# Patient Record
Sex: Male | Born: 1950
Health system: Southern US, Community
[De-identification: ages and names within clinical notes are randomized; demographics above are authoritative.]

## PROBLEM LIST (undated history)

## (undated) DIAGNOSIS — E669 Obesity, unspecified: Secondary | ICD-10-CM

## (undated) DIAGNOSIS — E785 Hyperlipidemia, unspecified: Secondary | ICD-10-CM

## (undated) DIAGNOSIS — I77810 Thoracic aortic ectasia: Secondary | ICD-10-CM

## (undated) DIAGNOSIS — M1990 Unspecified osteoarthritis, unspecified site: Secondary | ICD-10-CM

## (undated) DIAGNOSIS — Z8719 Personal history of other diseases of the digestive system: Secondary | ICD-10-CM

## (undated) DIAGNOSIS — G4730 Sleep apnea, unspecified: Secondary | ICD-10-CM

## (undated) DIAGNOSIS — I10 Essential (primary) hypertension: Secondary | ICD-10-CM

## (undated) DIAGNOSIS — I219 Acute myocardial infarction, unspecified: Secondary | ICD-10-CM

## (undated) DIAGNOSIS — I4892 Unspecified atrial flutter: Secondary | ICD-10-CM

## (undated) DIAGNOSIS — I2510 Atherosclerotic heart disease of native coronary artery without angina pectoris: Secondary | ICD-10-CM

## (undated) DIAGNOSIS — I739 Peripheral vascular disease, unspecified: Secondary | ICD-10-CM

## (undated) HISTORY — DX: Essential (primary) hypertension: I10

## (undated) HISTORY — PX: CARDIAC SURGERY: SHX584

## (undated) HISTORY — DX: Unspecified atrial flutter: I48.92

## (undated) HISTORY — PX: APPENDECTOMY: SHX54

## (undated) HISTORY — PX: CORONARY ANGIOPLASTY WITH STENT PLACEMENT: SHX49

## (undated) HISTORY — DX: Thoracic aortic ectasia: I77.810

## (undated) HISTORY — DX: Obesity, unspecified: E66.9

## (undated) HISTORY — DX: Personal history of other diseases of the digestive system: Z87.19

## (undated) HISTORY — PX: CHOLECYSTECTOMY: SHX55

## (undated) HISTORY — PX: OTHER SURGICAL HISTORY: SHX169

## (undated) HISTORY — DX: Atherosclerotic heart disease of native coronary artery without angina pectoris: I25.10

## (undated) HISTORY — PX: ELBOW ARTHROSCOPY: SHX614

---

## 2003-03-05 ENCOUNTER — Inpatient Hospital Stay (HOSPITAL_COMMUNITY): Admission: AD | Admit: 2003-03-05 | Discharge: 2003-03-11 | Payer: Self-pay | Admitting: Cardiology

## 2003-03-05 ENCOUNTER — Encounter: Payer: Self-pay | Admitting: Cardiology

## 2003-06-18 ENCOUNTER — Inpatient Hospital Stay (HOSPITAL_COMMUNITY): Admission: EM | Admit: 2003-06-18 | Discharge: 2003-06-21 | Payer: Self-pay | Admitting: Cardiology

## 2003-08-12 ENCOUNTER — Inpatient Hospital Stay (HOSPITAL_COMMUNITY): Admission: AD | Admit: 2003-08-12 | Discharge: 2003-08-14 | Payer: Self-pay | Admitting: Cardiology

## 2004-02-05 ENCOUNTER — Inpatient Hospital Stay (HOSPITAL_COMMUNITY): Admission: EM | Admit: 2004-02-05 | Discharge: 2004-02-07 | Payer: Self-pay | Admitting: Cardiology

## 2004-07-03 HISTORY — PX: TOTAL KNEE ARTHROPLASTY: SHX125

## 2004-08-04 ENCOUNTER — Ambulatory Visit: Payer: Self-pay | Admitting: Cardiology

## 2004-08-26 ENCOUNTER — Ambulatory Visit: Payer: Self-pay | Admitting: Internal Medicine

## 2004-08-26 ENCOUNTER — Inpatient Hospital Stay (HOSPITAL_COMMUNITY): Admission: RE | Admit: 2004-08-26 | Discharge: 2004-08-31 | Payer: Self-pay | Admitting: Specialist

## 2005-02-23 ENCOUNTER — Ambulatory Visit: Payer: Self-pay | Admitting: Cardiology

## 2005-06-14 ENCOUNTER — Ambulatory Visit: Payer: Self-pay | Admitting: Cardiology

## 2005-06-20 ENCOUNTER — Ambulatory Visit: Payer: Self-pay | Admitting: Cardiology

## 2005-07-03 HISTORY — PX: TOTAL KNEE ARTHROPLASTY: SHX125

## 2005-08-25 ENCOUNTER — Inpatient Hospital Stay (HOSPITAL_COMMUNITY): Admission: RE | Admit: 2005-08-25 | Discharge: 2005-08-30 | Payer: Self-pay | Admitting: Specialist

## 2005-08-28 ENCOUNTER — Ambulatory Visit: Payer: Self-pay | Admitting: Internal Medicine

## 2006-10-05 ENCOUNTER — Ambulatory Visit: Payer: Self-pay | Admitting: Physician Assistant

## 2006-10-19 ENCOUNTER — Ambulatory Visit: Payer: Self-pay | Admitting: Cardiology

## 2006-11-19 ENCOUNTER — Ambulatory Visit: Payer: Self-pay | Admitting: Physician Assistant

## 2007-05-14 ENCOUNTER — Ambulatory Visit: Payer: Self-pay | Admitting: Cardiology

## 2007-05-20 ENCOUNTER — Encounter: Payer: Self-pay | Admitting: Cardiology

## 2008-10-13 ENCOUNTER — Ambulatory Visit: Payer: Self-pay | Admitting: Cardiology

## 2008-11-02 ENCOUNTER — Encounter: Payer: Self-pay | Admitting: Cardiology

## 2008-11-03 ENCOUNTER — Ambulatory Visit: Payer: Self-pay | Admitting: Cardiology

## 2009-02-22 ENCOUNTER — Encounter: Payer: Self-pay | Admitting: Cardiology

## 2009-02-26 ENCOUNTER — Encounter: Payer: Self-pay | Admitting: Cardiology

## 2009-03-03 ENCOUNTER — Encounter: Payer: Self-pay | Admitting: Cardiology

## 2009-03-03 HISTORY — PX: CERVICAL FUSION: SHX112

## 2009-03-09 ENCOUNTER — Inpatient Hospital Stay (HOSPITAL_COMMUNITY): Admission: RE | Admit: 2009-03-09 | Discharge: 2009-03-13 | Payer: Self-pay | Admitting: Neurosurgery

## 2009-03-10 ENCOUNTER — Encounter: Payer: Self-pay | Admitting: Cardiology

## 2009-03-13 ENCOUNTER — Encounter: Payer: Self-pay | Admitting: Cardiology

## 2009-03-22 DIAGNOSIS — E782 Mixed hyperlipidemia: Secondary | ICD-10-CM

## 2009-03-22 DIAGNOSIS — I2510 Atherosclerotic heart disease of native coronary artery without angina pectoris: Secondary | ICD-10-CM

## 2009-03-22 DIAGNOSIS — I10 Essential (primary) hypertension: Secondary | ICD-10-CM | POA: Insufficient documentation

## 2009-03-22 DIAGNOSIS — E663 Overweight: Secondary | ICD-10-CM | POA: Insufficient documentation

## 2009-05-14 ENCOUNTER — Encounter: Payer: Self-pay | Admitting: Cardiology

## 2009-05-19 ENCOUNTER — Ambulatory Visit: Payer: Self-pay | Admitting: Cardiology

## 2009-05-19 DIAGNOSIS — I77810 Thoracic aortic ectasia: Secondary | ICD-10-CM | POA: Insufficient documentation

## 2009-05-19 DIAGNOSIS — G4733 Obstructive sleep apnea (adult) (pediatric): Secondary | ICD-10-CM

## 2009-05-19 DIAGNOSIS — Z8719 Personal history of other diseases of the digestive system: Secondary | ICD-10-CM | POA: Insufficient documentation

## 2009-05-19 DIAGNOSIS — I259 Chronic ischemic heart disease, unspecified: Secondary | ICD-10-CM | POA: Insufficient documentation

## 2009-09-15 ENCOUNTER — Encounter: Payer: Self-pay | Admitting: Cardiology

## 2009-10-01 HISTORY — PX: SHOULDER ARTHROSCOPY: SHX128

## 2009-10-12 ENCOUNTER — Ambulatory Visit (HOSPITAL_BASED_OUTPATIENT_CLINIC_OR_DEPARTMENT_OTHER): Admission: RE | Admit: 2009-10-12 | Discharge: 2009-10-13 | Payer: Self-pay | Admitting: Specialist

## 2009-12-03 ENCOUNTER — Encounter: Payer: Self-pay | Admitting: Cardiology

## 2009-12-03 ENCOUNTER — Encounter (INDEPENDENT_AMBULATORY_CARE_PROVIDER_SITE_OTHER): Payer: Self-pay | Admitting: *Deleted

## 2010-01-06 ENCOUNTER — Telehealth (INDEPENDENT_AMBULATORY_CARE_PROVIDER_SITE_OTHER): Payer: Self-pay | Admitting: *Deleted

## 2010-01-11 ENCOUNTER — Ambulatory Visit: Payer: Self-pay | Admitting: Cardiology

## 2010-01-20 ENCOUNTER — Ambulatory Visit: Payer: Self-pay | Admitting: Cardiology

## 2010-04-07 ENCOUNTER — Encounter: Payer: Self-pay | Admitting: Cardiology

## 2010-08-02 NOTE — Letter (Signed)
Summary: Letter/ FAXED [facility]  Letter/ FAXED [facility]   Imported By: Dorise Hiss 09/15/2009 17:10:26  _____________________________________________________________________  External Attachment:    Type:   Image     Comment:   External Document

## 2010-08-02 NOTE — Miscellaneous (Signed)
Summary: Orders Update  Clinical Lists Changes  Orders: Added new Referral order of 2-D Echocardiogram (2D Echo) - Signed 

## 2010-08-02 NOTE — Assessment & Plan Note (Signed)
Summary: 6 MO FU PER MAY REMINDER-SRS  Medications Added METOPROLOL TARTRATE 50 MG TABS (METOPROLOL TARTRATE) Take 1/2 tablet by mouth two times a day DIOVAN HCT 160-25 MG TABS (VALSARTAN-HYDROCHLOROTHIAZIDE) Take 1 tablet by mouth once a day NAPROSYN 500 MG TABS (NAPROXEN) Take 1 tablet by mouth twice a day COENZYME Q10 200 MG CAPS (COENZYME Q10) Take 1 tablet by mouth once a day FLAX SEED OIL 1000 MG CAPS (FLAXSEED (LINSEED)) Take 1 tablet by mouth two times a day FOLGARD 115-800-10 MCG-MCG-MG TABS (FOLIC ACID-VIT B6-VIT B12) Take 1 tablet by mouth once a day VITAMIN B-6 50 MG TABS (PYRIDOXINE HCL) Take 1 tablet by mouth once a day VITAMIN B-12 1000 MCG TABS (CYANOCOBALAMIN) Take 1 tablet by mouth once a day RA BIOTIN 2500 MCG CAPS (BIOTIN) Take 1 tablet by mouth once a day      Allergies Added: NKDA  Primary Provider:  Dimas Aguas   History of Present Illness: the patient is a 60 year old male with history of coronary artery disease, aortic root dilatation, status post prior multiple stents with a history of late stent thrombosis and no procedures since 2005. The patient is a lifelong therapy with Plavix. The patient is doing well. He denies any shortness of breath or chest pain. He has no palpitations or syncope. His blood pressure is controlled in the setting of aortic root dilatation.  Current Medications (verified): 1)  Plavix 75 Mg Tabs (Clopidogrel Bisulfate) .... Take 1 Tablet By Mouth Once A Day 2)  Metoprolol Tartrate 50 Mg Tabs (Metoprolol Tartrate) .... Take 1/2 Tablet By Mouth Two Times A Day 3)  Diovan Hct 160-25 Mg Tabs (Valsartan-Hydrochlorothiazide) .... Take 1 Tablet By Mouth Once A Day 4)  Naprosyn 500 Mg Tabs (Naproxen) .... Take 1 Tablet By Mouth Twice A Day 5)  Multivitamins  Tabs (Multiple Vitamin) .... Take 1 Tablet By Mouth Once A Day 6)  Fish Oil 1000 Mg Caps (Omega-3 Fatty Acids) .... Take 2 Tablet By Mouth Once A Day 7)  Aspirin 325 Mg Tabs (Aspirin) .... Take 1  Tablet By Mouth Once A Day 8)  Folic Acid 1 Mg Tabs (Folic Acid) .... Take 1 Tablet By Mouth Once A Day 9)  Coenzyme Q10 200 Mg Caps (Coenzyme Q10) .... Take 1 Tablet By Mouth Once A Day 10)  Flax Seed Oil 1000 Mg Caps (Flaxseed (Linseed)) .... Take 1 Tablet By Mouth Two Times A Day 11)  Folgard 115-800-10 Mcg-Mcg-Mg Tabs (Folic Acid-Vit B6-Vit B12) .... Take 1 Tablet By Mouth Once A Day 12)  Vitamin B-6 50 Mg Tabs (Pyridoxine Hcl) .... Take 1 Tablet By Mouth Once A Day 13)  Vitamin B-12 1000 Mcg Tabs (Cyanocobalamin) .... Take 1 Tablet By Mouth Once A Day 14)  Ra Biotin 2500 Mcg Caps (Biotin) .... Take 1 Tablet By Mouth Once A Day  Allergies (verified): No Known Drug Allergies  Comments:  Nurse/Medical Assistant: The patient's medication list and allergies were reviewed with the patient and were updated in the Medication and Allergy Lists.  Past History:  Past Medical History: Last updated: 05/19/2009 OVERWEIGHT/OBESITY (ICD-278.02) HYPERTENSION, UNSPECIFIED (ICD-401.9) HYPERLIPIDEMIA-MIXED (ICD-272.4) CAD, NATIVE VESSEL (ICD-414.01) Osteoarthritis.  Aortic root dilatation.  GI bleeding on high-dose nonsteroidals OSA 1. Coronary artery disease with stable symptoms.     a.     Status post left percutaneous coronary interventions, heart      catheterization in 2005.     b.     Stress Cardiolite test in 2008, no ischemia.  c.     __________ dysfunction. 2. Dyslipidemia. 3. Hypertension. 4. Obstructive sleep apnea. 5. Obesity. 6. Osteoarthritis. 7. Aortic root dilatation. 8. GI bleeding on high-dose nonsteroidals. C3 in C4 and C4 and C5 translaminar plasty with segmental posterior lateral fusion and posterolateral arthrodesis 03/13/2009  Past Surgical History: Last updated: 03/22/2009 Cholecystectomy Knee Arthroplasty-Total  Family History: Last updated: 03/22/2009 Family History of Cancer:  Family History of Coronary Artery Disease:   Social History: Last  updated: 03/22/2009 Full Time Tobacco Use - No.  Alcohol Use - yes Married   Risk Factors: Smoking Status: never (05/19/2009)  Review of Systems  The patient denies fatigue, malaise, fever, weight gain/loss, vision loss, decreased hearing, hoarseness, chest pain, palpitations, shortness of breath, prolonged cough, wheezing, sleep apnea, coughing up blood, abdominal pain, blood in stool, nausea, vomiting, diarrhea, heartburn, incontinence, blood in urine, muscle weakness, joint pain, leg swelling, rash, skin lesions, headache, fainting, dizziness, depression, anxiety, enlarged lymph nodes, easy bruising or bleeding, and environmental allergies.    Vital Signs:  Patient profile:   60 year old male Height:      69 inches Weight:      274 pounds Pulse rate:   55 / minute BP sitting:   131 / 82  (left arm) Cuff size:   large  Vitals Entered By: Carlye Grippe (January 11, 2010 10:40 AM)  Physical Exam  Additional Exam:  General: Well-developed, well-nourished in no distress head: Normocephalic and atraumatic eyes PERRLA/EOMI intact, conjunctiva and lids normal nose: No deformity or lesions mouth normal dentition, normal posterior pharynx neck: Supple, no JVD.  No masses, thyromegaly or abnormal cervical nodes lungs: Normal breath sounds bilaterally without wheezing.  Normal percussion heart: regular rate and rhythm with normal S1 and S2, no S3 or S4.  PMI is normal.  No pathological murmurs abdomen: Normal bowel sounds, abdomen is soft and nontender without masses, organomegaly or hernias noted.  No hepatosplenomegaly musculoskeletal: Back normal, normal gait muscle strength and tone normal pulsus: Pulse is normal in all 4 extremities Extremities: No peripheral pitting edema neurologic: Alert and oriented x 3 skin: Intact without lesions or rashes cervical nodes: No significant adenopathy psychologic: Normal affect    Impression & Recommendations:  Problem # 1:  AORTIC DISORDER  (ICD-424.1) the patient we'll have an 2-D echocardiogram done to follow up on his aortic root dilatation. The following medications were removed from the medication list:    Hydrochlorothiazide 12.5 Mg Tabs (Hydrochlorothiazide) .Marland Kitchen... Take 1 tablet by mouth once a day His updated medication list for this problem includes:    Metoprolol Tartrate 50 Mg Tabs (Metoprolol tartrate) .Marland Kitchen... Take 1/2 tablet by mouth two times a day    Diovan Hct 160-25 Mg Tabs (Valsartan-hydrochlorothiazide) .Marland Kitchen... Take 1 tablet by mouth once a day  Problem # 2:  GASTROINTESTINAL HEMORRHAGE, HX OF (ICD-V12.79) no recurrence  Problem # 3:  CAD, NATIVE VESSEL (ICD-414.01) the patient has multiple prior procedures but hasn't had any recurrent symptoms or an acute coronary syndrome in the last several years. Continue his factor modification statins and Plavix His updated medication list for this problem includes:    Plavix 75 Mg Tabs (Clopidogrel bisulfate) .Marland Kitchen... Take 1 tablet by mouth once a day    Metoprolol Tartrate 50 Mg Tabs (Metoprolol tartrate) .Marland Kitchen... Take 1/2 tablet by mouth two times a day    Aspirin 325 Mg Tabs (Aspirin) .Marland Kitchen... Take 1 tablet by mouth once a day  Patient Instructions: 1)  2D Echo  2)  Follow up in  6 months

## 2010-08-02 NOTE — Letter (Signed)
Summary: [employer] [facility], [facility] at [facility] [address]   [place], [place] [address]   Phone: [phone]  Fax: [phone]    12/03/2009  Dmoni Ovens [address] [place], [place]  [address]  Dear Mr. Bellmore,   Enclosed is a copy of your 2 D echo order from Dr. Andee Lineman. If this date and time are not suitable please feel free to  contact our office and we will be happy to re-schedule the test for you.        Sincerely,   Zachary George Patient Care Coordinator

## 2010-08-02 NOTE — Progress Notes (Signed)
Summary: Pending Echo   Phone Note Outgoing Call   Call placed by: Cyril Loosen, RN, BSN,  January 06, 2010 9:05 AM Summary of Call: Left message to call back on machine. Pt scheduled for Echo on 6/29. It does not appear echo has been done. Pt has appt with Dr. Earnestine Leys on 7/12. Echo should be done before this appt. Initial call taken by: Cyril Loosen, RN, BSN,  January 06, 2010 9:06 AM  Follow-up for Phone Call        Pt present for OV today. This will be d/w pt at office visit. Follow-up by: Cyril Loosen, RN, BSN,  January 11, 2010 10:34 AM

## 2010-08-11 ENCOUNTER — Encounter: Payer: Self-pay | Admitting: Cardiology

## 2010-08-11 ENCOUNTER — Ambulatory Visit (INDEPENDENT_AMBULATORY_CARE_PROVIDER_SITE_OTHER): Payer: BC Managed Care – PPO | Admitting: Cardiology

## 2010-08-11 DIAGNOSIS — Z951 Presence of aortocoronary bypass graft: Secondary | ICD-10-CM

## 2010-08-11 DIAGNOSIS — I951 Orthostatic hypotension: Secondary | ICD-10-CM | POA: Insufficient documentation

## 2010-08-11 DIAGNOSIS — I2510 Atherosclerotic heart disease of native coronary artery without angina pectoris: Secondary | ICD-10-CM

## 2010-08-18 NOTE — Assessment & Plan Note (Signed)
Summary: 6 MO FU RECV REMINDER/SRS  Medications Added LOSARTAN POTASSIUM-HCTZ 100-25 MG TABS (LOSARTAN POTASSIUM-HCTZ) Take 1 tablet by mouth once a day FISH OIL 1000 MG CAPS (OMEGA-3 FATTY ACIDS) Take 2 tablet by mouth twice a day FLAX SEED OIL 1000 MG CAPS (FLAXSEED (LINSEED)) Take 2 tablet by mouth two times a day TURMERIC CURCUMIN  CAPS (MISC NATURAL PRODUCTS) 300mg  Take 2 tablet by mouth two times a day UBQH 50 MG CAPS (UBIQUINOL) (100mg )Take 2 tablet by mouth once a day CINNAMON 500 MG TABS (CINNAMON) Take 3 tablet by mouth two times a day      Allergies Added: NKDA  Primary Provider:  Dimas Aguas   History of Present Illness: the patient is a 60 year old male with a history of coronary artery disease status post prior drug alluding stent placement.  The patient also has a history of aortic root dilatation initially diagnosed in 2008 and measures at 41 mm.  A recent echocardiogram demonstrated an ascending root diameter of 36 mm.  The patient blood pressure has been controlled.  Patient complains for about a week of some dizziness. It has occurred in the morning when he tries to sit up and has to do this slowly. He also had it happen in the middle of the night while lying down and the last very briefly.  He reports no chest pain short of breath orthopnea PND. He is aware that he needs lifelong Plavix given his history of late stent thrombosis in 2005. otherwise from a cardiovascular standpoint he's been doing well.  EKG was reviewed today was within normal limits.  Current Medications (verified): 1)  Plavix 75 Mg Tabs (Clopidogrel Bisulfate) .... Take 1 Tablet By Mouth Once A Day 2)  Metoprolol Tartrate 50 Mg Tabs (Metoprolol Tartrate) .... Take 1/2 Tablet By Mouth Two Times A Day 3)  Losartan Potassium-Hctz 100-25 Mg Tabs (Losartan Potassium-Hctz) .... Take 1 Tablet By Mouth Once A Day 4)  Naprosyn 500 Mg Tabs (Naproxen) .... Take 1 Tablet By Mouth Twice A Day 5)  Multivitamins  Tabs  (Multiple Vitamin) .... Take 1 Tablet By Mouth Once A Day 6)  Fish Oil 1000 Mg Caps (Omega-3 Fatty Acids) .... Take 2 Tablet By Mouth Twice A Day 7)  Aspirin 325 Mg Tabs (Aspirin) .... Take 1 Tablet By Mouth Once A Day 8)  Coenzyme Q10 200 Mg Caps (Coenzyme Q10) .... Take 1 Tablet By Mouth Once A Day 9)  Flax Seed Oil 1000 Mg Caps (Flaxseed (Linseed)) .... Take 2 Tablet By Mouth Two Times A Day 10)  Folgard 115-800-10 Mcg-Mcg-Mg Tabs (Folic Acid-Vit B6-Vit B12) .... Take 1 Tablet By Mouth Once A Day 11)  Turmeric Curcumin  Caps (Misc Natural Products) .... 300mg  Take 2 Tablet By Mouth Two Times A Day 12)  Ubqh 50 Mg Caps (Ubiquinol) .... (100mg )take 2 Tablet By Mouth Once A Day 13)  Cinnamon 500 Mg Tabs (Cinnamon) .... Take 3 Tablet By Mouth Two Times A Day  Allergies (verified): No Known Drug Allergies  Comments:  Nurse/Medical Assistant: The patient's medication list and allergies were reviewed with the patient and were updated in the Medication and Allergy Lists.  Past History:  Past Surgical History: Last updated: 03/22/2009 Cholecystectomy Knee Arthroplasty-Total  Family History: Last updated: 03/22/2009 Family History of Cancer:  Family History of Coronary Artery Disease:   Social History: Last updated: 03/22/2009 Full Time Tobacco Use - No.  Alcohol Use - yes Married   Past Medical History: OVERWEIGHT/OBESITY (ICD-278.02) HYPERTENSION, UNSPECIFIED (ICD-401.9)  HYPERLIPIDEMIA-MIXED (ICD-272.4) CAD, NATIVE VESSEL (ICD-414.01) Osteoarthritis.  Aortic root dilatation. CT angiogram 2008 41 mm, echocardiogram December 2011 36 mm GI bleeding on high-dose nonsteroidals OSA 1. Coronary artery disease with stable symptoms.     a.     Status post left percutaneous coronary interventions, heart      catheterization in 2005.     b.     Stress Cardiolite test in 2008, no ischemia. 2. Dyslipidemia. 3. Hypertension. 4. Obstructive sleep apnea. 5. Obesity. 6.  Osteoarthritis. 7. Aortic root dilatation. 8. GI bleeding on high-dose nonsteroidals. C3 in C4 and C4 and C5 translaminar plasty with segmental posterior lateral fusion and posterolateral arthrodesis 03/13/2009  Review of Systems       The patient complains of dizziness.  The patient denies fatigue, malaise, fever, weight gain/loss, vision loss, decreased hearing, hoarseness, chest pain, palpitations, shortness of breath, prolonged cough, wheezing, sleep apnea, coughing up blood, abdominal pain, blood in stool, nausea, vomiting, diarrhea, heartburn, incontinence, blood in urine, muscle weakness, joint pain, leg swelling, rash, skin lesions, headache, fainting, depression, anxiety, enlarged lymph nodes, easy bruising or bleeding, and environmental allergies.    Vital Signs:  Patient profile:   60 year old male Height:      69 inches Weight:      272 pounds BMI:     40.31 Pulse rate:   60 / minute Pulse (ortho):   61 / minute BP sitting:   135 / 87  (left arm) BP standing:   135 / 87 Cuff size:   large  Vitals Entered By: Carlye Grippe (August 11, 2010 10:45 AM)  Nutrition Counseling: Patient's BMI is greater than 25 and therefore counseled on weight management options.  Serial Vital Signs/Assessments:  Time      Position  BP       Pulse  Resp  Temp     By 11:34 AM  Lying LA  124/76   58                    Lydia Anderson 11:34 AM  Sitting   132/85   63                    Lydia Anderson 11:34 AM  Standing  135/87   61                    Lydia Anderson 11:37 AM  Standing  134/84   65                    Lydia Anderson 11:43 AM  Standing  136/86   62                    Carlye Grippe   Physical Exam  Additional Exam:  General: Well-developed, well-nourished in no distress head: Normocephalic and atraumatic eyes PERRLA/EOMI intact, conjunctiva and lids normal nose: No deformity or lesions mouth normal dentition, normal posterior pharynx neck: Supple, no JVD.  No masses,  thyromegaly or abnormal cervical nodes lungs: Normal breath sounds bilaterally without wheezing.  Normal percussion heart: regular rate and rhythm with normal S1 and S2, no S3 or S4.  PMI is normal.  No pathological murmurs abdomen: Normal bowel sounds, abdomen is soft and nontender without masses, organomegaly or hernias noted.  No hepatosplenomegaly musculoskeletal: Back normal, normal gait muscle strength and tone normal pulsus: Pulse is normal in all 4 extremities Extremities: No peripheral pitting edema neurologic: Alert  and oriented x 3 skin: Intact without lesions or rashes cervical nodes: No significant adenopathy psychologic: Normal affect    EKG  Procedure date:  08/11/2010  Findings:      normal sinus rhythm no acute abnormalities.  Impression & Recommendations:  Problem # 1:  OVERWEIGHT/OBESITY (ICD-278.02)  the patient has been counseled regarding weight reduction.  Problem # 2:  CORONARY ARTERY DISEASE, S/P PTCA (ICD-414.9) the patient has no recurrent chest pain.  His last stress test was in 2008.  We will plan on a stress test in the next clinic visit.  Is currently asymptomatic.  The patient has been compliant with Plavix.  He knows that he needs lifelong therapy with Plavix due to her history of late stent thrombosis in 2005 His updated medication list for this problem includes:    Plavix 75 Mg Tabs (Clopidogrel bisulfate) .Marland Kitchen... Take 1 tablet by mouth once a day    Metoprolol Tartrate 50 Mg Tabs (Metoprolol tartrate) .Marland Kitchen... Take 1/2 tablet by mouth two times a day    Aspirin 325 Mg Tabs (Aspirin) .Marland Kitchen... Take 1 tablet by mouth once a day  Problem # 3:  HYPERTENSION, UNSPECIFIED (ICD-401.9) diastolic blood pressure slightly increased but the patient assures me that at home and during his visit with his primary care physician his blood pressures are normal. His updated medication list for this problem includes:    Metoprolol Tartrate 50 Mg Tabs (Metoprolol tartrate)  .Marland Kitchen... Take 1/2 tablet by mouth two times a day    Losartan Potassium-hctz 100-25 Mg Tabs (Losartan potassium-hctz) .Marland Kitchen... Take 1 tablet by mouth once a day    Aspirin 325 Mg Tabs (Aspirin) .Marland Kitchen... Take 1 tablet by mouth once a day  Problem # 4:  HYPERLIPIDEMIA-MIXED (ICD-272.4) lipid panel was obtained several months ago and his LDL cholesterol is 54 mg percent well within goal.  Problem # 5:  HYPOTENSION, ORTHOSTATIC (ICD-458.0) orthostatic blood pressures were done in the office.  Other Orders: EKG w/ Interpretation (93000)   Appended Document: [facility]      Allergies: No Known Drug Allergies   Patient Instructions: 1)  Your physician recommends that you continue on your current medications as directed. Please refer to the Current Medication list given to you today. 2)  Follow up in  6 months

## 2010-09-21 LAB — POCT I-STAT 4, (NA,K, GLUC, HGB,HCT)
Glucose, Bld: 102 mg/dL — ABNORMAL HIGH (ref 70–99)
HCT: 44 % (ref 39.0–52.0)
Sodium: 138 mEq/L (ref 135–145)

## 2010-10-07 LAB — CARDIAC PANEL(CRET KIN+CKTOT+MB+TROPI)
Total CK: 1094 U/L — ABNORMAL HIGH (ref 7–232)
Troponin I: 0.02 ng/mL (ref 0.00–0.06)

## 2010-10-07 LAB — COMPREHENSIVE METABOLIC PANEL
ALT: 34 U/L (ref 0–53)
Calcium: 9.3 mg/dL (ref 8.4–10.5)
Creatinine, Ser: 1.04 mg/dL (ref 0.4–1.5)
GFR calc Af Amer: >60 mL/min (ref >60)
Glucose, Bld: 105 mg/dL — ABNORMAL HIGH (ref 70–99)
Sodium: 137 mEq/L (ref 135–145)
Total Protein: 6.9 g/dL (ref 6.0–8.3)

## 2010-10-07 LAB — DIFFERENTIAL
Eosinophils Absolute: 0.2 10*3/uL (ref 0.0–0.7)
Lymphocytes Relative: 24 % (ref 12–46)
Lymphs Abs: 1.8 10*3/uL (ref 0.7–4.0)
Monocytes Relative: 13 % — ABNORMAL HIGH (ref 3–12)
Neutrophils Relative %: 60 % (ref 43–77)

## 2010-10-07 LAB — URINALYSIS, ROUTINE W REFLEX MICROSCOPIC
Nitrite: NEGATIVE
Protein, ur: NEGATIVE mg/dL
Specific Gravity, Urine: 1.009 (ref 1.005–1.030)
Urobilinogen, UA: 0.2 mg/dL (ref 0.0–1.0)

## 2010-10-07 LAB — CBC
Hemoglobin: 14.4 g/dL (ref 13.0–17.0)
MCHC: 34.5 g/dL (ref 30.0–36.0)
MCV: 89.8 fL (ref 78.0–100.0)
RDW: 13.2 % (ref 11.5–15.5)

## 2010-10-07 LAB — APTT: aPTT: 27 seconds (ref 24–37)

## 2010-10-07 LAB — PROTIME-INR
INR: 0.9 (ref 0.00–1.49)
Prothrombin Time: 12.3 seconds (ref 11.6–15.2)

## 2010-10-25 ENCOUNTER — Ambulatory Visit (HOSPITAL_BASED_OUTPATIENT_CLINIC_OR_DEPARTMENT_OTHER)
Admission: RE | Admit: 2010-10-25 | Payer: BC Managed Care – PPO | Source: Ambulatory Visit | Admitting: Orthopedic Surgery

## 2010-11-15 NOTE — Assessment & Plan Note (Signed)
[facility]                          [name] OFFICE NOTE   NAME:[name], [name]               MRN:          [id number]  DATE:05/14/2007                            DOB:          01/12/1951    PRIMARY CARE PHYSICIAN:  Selinda Flavin, MD   HISTORY OF PRESENT ILLNESS:  The patient is a pleasant 60 year old male  with a history of coronary artery disease. Please see details regarding  his coronary artery disease on note October 05, 2006, by Dr. Tereso Newcomer.   The patient has been doing well. He reports no chest pain, shortness of  breath, orthopnea or PND. He is compliant with his CPAP mask. He  presents for routine followup. The patient does report some easy  bruisability. I have advised him to cut back on his aspirin to 81 mg a  day, but continue his Plavix.   CURRENT MEDICATIONS:  1. Prilosec 20 mg.  2. Zyrtec.  3. Toprol XL 50 mg p.o. daily.  4. Diovan 80 mg p.o. daily.  5. Vytorin 10/80 mg p.o. daily.  6. Folic acid a day.  7. Enteric coated aspirin.  8. Glucosamine.  9. Multivitamin.  10.Fish oil.  11.Co-enzyme Q10.  12.Flax seed oil.  13.Plavix 75 mg a day.  14.CPAP nightly.   PHYSICAL EXAMINATION:  VITAL SIGNS: Blood pressure 138/84, heart rate  55, weight 267 pounds.  NECK: Normal carotid upstroke. No carotid bruits.  LUNGS:  Clear breath sounds bilaterally.  HEART: Regular rate and rhythm. Normal S1, S2. No murmur, rubs or  gallops.  ABDOMEN: Soft and nontender. No rebound or guarding. Good bowel sounds.  EXTREMITIES: No cyanosis, clubbing or edema.  NEURO: The patient is alert, oriented and grossly nonfocal.   PROBLEM LIST:  1. Coronary artery disease status post multiple percutaneous coronary      interventions (see details on note October 05, 2006).  2. Stress Cardiolite study in December 2008, no ischemia and ejection      fraction of 52%.  3. Preserved left ventricular function.  4. Treated dyslipidemia.  5.  Hypertension, controlled.  6. Obstructive sleep apnea.  7. Obesity.  8. Osteoarthritis, status post bilateral knee replacements.  9. Aortic root dilatation at the level of the descending aorta 4 x 3.7      cm.  10.GI bleeding on high dose nonsteroidals.   PLAN:  1. The patient, from a cardiovascular perspective, is doing quite      well. He is due for screening of his ascending aorta to rule out      aneurysm. We have ordered a CT scan of the chest.  2. The patient in the next six months will need repeat surveillance      testing with an exercise Cardiolite stress study.  3. I have made no changes in the patient's medical regimen otherwise      and he will followup regarding his blood pressure and cholesterol      levels with Dr. Dimas Aguas.     Learta Codding, MD,FACC  Electronically Signed    GED/MedQ  DD: 05/14/2007  DT: 05/15/2007  Job #: [id number]  cc:   Selinda Flavin

## 2010-11-15 NOTE — Assessment & Plan Note (Signed)
[facility]                          [name] OFFICE NOTE   NAME:[name], [name]               MRN:          [id number]  DATE:10/13/2008                            DOB:          1950-07-16    REFERRING PHYSICIAN:  Selinda Flavin   HISTORY OF PRESENT ILLNESS:  The patient is a pleasant 60 year old male  with history of coronary artery disease, status post multiple prior  percutaneous coronary interventions.  Last stress Cardiolite was in  December 2008 with no ischemia.  The patient is doing well.  The patient  continues to gain weight and is interested in gastric band surgery.  He  wants to get cardiac clearance.  He denies, however, any cardiac  problems.  He denies any chest pain, increased shortness of breath,  orthopnea, or PND.  There is no palpitations or syncope.   MEDICATIONS:  1. Hydrochlorothiazide 12.5 mg p.o. daily.  2. Plavix 75 mg p.o. daily.  3. Metoprolol 50 mg p.o. daily.  4. Diovan 80 mg p.o. daily.  5. Naprosyn 500 mg p.o. daily.  6. __________ 4 mg 2 tablets p.o. daily.  7. __________ p.o. daily.  8. Sinemet p.o. daily.  9. Multivitamin p.o. daily.  10.Glucosamine 1000 mg 2 tablets daily.  11.Omega-3 fish oil 2000 mg p.o. daily.  12.Aspirin 325 mg p.o. daily.  13.EDTA 625 mg two tablets daily.  14.CPAP q.h.s.   PHYSICAL EXAMINATION:  VITAL SIGNS:  Blood pressure is 135/83, heart  rate 65, weight 171 pounds.  GENERAL:  Overweight white male, in no apparent distress.  HEENT:  Pupils; eyes are clear.  Conjunctiva is clear.  NECK:  Supple.  Normal carotid upstroke, no carotid bruits.  LUNGS:  Clear breath sounds bilaterally.  HEART:  Regular rate and rhythm.  Normal S1, S2.  No murmurs or gallops.  ABDOMEN:  Soft, nontender.  No rebound or guarding.  Good bowel sounds.  EXTREMITIES:  No cyanosis, clubbing, or edema.  NEUROLOGIC:  The patient is alert, oriented, and grossly nonfocal.   PROBLEM LIST:  1.  Coronary artery disease with stable symptoms.      a.     Status post left percutaneous coronary interventions, heart       catheterization in 2005.      b.     Stress Cardiolite test in 2008, no ischemia.      c.     __________ dysfunction.  2. Dyslipidemia.  3. Hypertension.  4. Obstructive sleep apnea.  5. Obesity.  6. Osteoarthritis.  7. Aortic root dilatation.  8. GI bleeding on high-dose nonsteroidals.   PLAN:  1. The patient will need stress testing prior to gastric band surgery.      He is also due for routine stress test regardless of symptoms.  2. The patient also in the next 6 months will need to follow up for      his ascending aortic root dilatation, which was measured about 4.1      cm as previously.  We will arrange an imaging study on his visit.  3. The patient's blood pressure is controlled and I made no change in  his medical regimen.  4. The patient's EKG was reviewed.  There were no acute or new      changes.  5. The patient's stress test within normal limits.   I do think he can proceed as a reasonable risk for gastric band surgery.     Learta Codding, MD,FACC  Electronically Signed    GED/MedQ  DD: 10/13/2008  DT: 10/14/2008  Job #: [id number]   cc:   Selinda Flavin

## 2010-11-18 NOTE — Op Note (Signed)
[name], [name]              ACCOUNT NO.:  [id number]   MEDICAL RECORD NO.:  [id number]          PATIENT TYPE:  INP   LOCATION:  0002                         FACILITY:  [facility]   PHYSICIAN:  Erasmo Leventhal, M.D.DATE OF BIRTH:  Jul 29, 1950   DATE OF PROCEDURE:  08/25/2005  DATE OF DISCHARGE:                                 OPERATIVE REPORT   PREOPERATIVE DIAGNOSIS:  Left knee end-stage osteoarthritis.   POSTOPERATIVE DIAGNOSIS:  Left knee end-stage osteoarthritis.   PROCEDURE:  Left total knee arthroplasty.   SURGEON:  Erasmo Leventhal, M.D.   ASSISTANT:  Jaquelyn Bitter. Chabon, PA-C.   ANESTHESIA:  Spinal.   ESTIMATED BLOOD LOSS:  Less than 50 cc.   DRAINS:  Two medium Hemovac.   COMPLICATIONS:  None.   TOURNIQUET TIME:  One hour and 40 minutes at 350 mmHg.   COMPLICATIONS:  None.   DISPOSITION:  To PACU stable.   OPERATIVE IMPLANTS:  [employer] [facility] Sigma rotating platform, total  knee arthroplasty, size 3 femur, size 4 tibia, a 10 mm posterior stabilized  rotating platform tibial insert, and a 32 mm patella.   OPERATIVE DETAILS:  Patient counseled in the holding area.  The correct side  was identified.  The chart was reviewed and signed appropriately.  Taken to  the operating room.  Spinal anesthetic was administered.  Foley catheter was  placed, utilizing sterile technique by the OR circulating nurse.  The left  knee was examined.  Slight flexion contracture with flexion to 120 degrees.  Elevated, prepped with DuraPrep, and draped in a sterile fashion.  Exsanguinated, esmarch.  The tourniquet was inflated to 350 mmHg.  A  straight midline incision was made through the skin and subcutaneous tissue.  Medial and lateral soft tissue flaps were developed at the appropriate  level.  Medial parapatellar arthrotomy was performed.  Proximal medial soft  tissue release was done due to a varus knee.  The patella was reflected.  The knee was flexed.   End-stage arthritis changes.  Cruciate ligaments were  resected and a starting hole made in the distal femoral canal.  Irrigated  until the effluent was clear.  An intramedullary rod was gently placed.  I  chose a 5 degree valgus cut and took an 11 mm cut off the distal femur.  The  distal femur was found to be a size 3.  Rotational marks were made.  The  distal femur was cut to fit a size 3.  Medial and lateral menisci remnants  were removed under direct visualization.  Geniculate vessels were  coagulated.  Posterior neurovascular structures were  __________ off and  protected throughout the entire case.  Tibial eminence was resected.  The  proximal tibial was found to be a size 4.  The starting hole was made.  Stepper was utilized.  The canal was irrigated.  The effluent was clear.  Intramedullary rod was gently placed.  We chose a 2 mm cut based upon the  defect on the medial side, which gave a nice resection laterally.  The  posterior medial and posterolateral femoral osteophytes  were removed under  direct visualization.  Flexion and extension blocks were utilized, confirmed  in excellent resection and well balanced in flexion and extension gaps with  10 mm.  A tibial base plate was then applied.  Excellent rotation covers  were set.  A reamer, awl, and punch was then utilized.  A femoral box cut  was prepared in a standard fashion.  At this time, with the size 3 femur,  size 4 tibia, the 10 insert, we had good range of motion and soft tissue  balance, varus and valgus stress.  The patella was found to be a size 32.  The appropriate amount of bone was cut.  Locking holes were made.  Patellar  tracking was anatomic.  Trials were then removed.  The knee was copiously  irrigated and pulsatile lavaged.  Utilizing a modern cement technique, all  components were cemented in place.  A size 4 tibia, size 3 femur, 32  patella.  After cement had cured, excess cement was removed with a tibial   trial of 10.  We had excellent range of motion and soft tissue balance and  alignment of patellofemoral tracking.  The trial was removed.  The knee was  copiously irrigated again.  Excess cement was removed.  A final 10 mm  posterior stabilized tibial insert was implanted.  The knee was again  checked.  Excellent arthroplasty alignment and balance and patellofemoral  tracking.  Two medium Hemovac drains were placed.  Each layer was irrigated  with antibiotic solution during the closure.  Sequential closure in layers  was done of the arthrotomy.  Closing with Vicryl at 90 degrees of flexion,  subcu Vicryl, skin closed with subcu Monocryl suture.  Steri-Strips were  applied.  Sterile dressing was applied.  Ice pack.  The tourniquet was  deflated.  Normal circulation of the foot and ankle at the end of the case.  A knee immobilizer was applied.  He was then taken to the operating room and  PACU in stable condition.  Sponge and needle counts were correct.  No  complications or problems.   To help with surgical technique and retraction, Mr. Brett Canales Chabon's  assistance was needed throughout the entire case.           ______________________________  Erasmo Leventhal, M.D.     RAC/MEDQ  D:  08/25/2005  T:  08/26/2005  Job:  [id number]

## 2010-11-18 NOTE — Op Note (Signed)
Matthew, [name]              ACCOUNT NO.:  [id number]   MEDICAL RECORD NO.:  [id number]          PATIENT TYPE:  INP   LOCATION:  0003                         FACILITY:  [facility]   PHYSICIAN:  Erasmo Leventhal, M.D.DATE OF BIRTH:  07/16/1950   DATE OF PROCEDURE:  08/26/2004  DATE OF DISCHARGE:                                 OPERATIVE REPORT   PREOPERATIVE DIAGNOSES:  Right knee end-stage osteoarthritis.   POSTOPERATIVE DIAGNOSES:  Right knee end-stage osteoarthritis.   PROCEDURE:  Right total knee arthroplasty.   SURGEON:  Erasmo Leventhal, M.D.   FIRST ASSISTANT:  Madlyn Frankel. Charlann Boxer, M.D.   SECOND ASSISTANT:  Jaquelyn Bitter. Chabon, P.A.   ANESTHESIA:  Spinal.   ESTIMATED BLOOD LOSS:  Less than 100 mL.   DRAINS:  Two medium Hemovac.   COMPLICATIONS:  None.   TOURNIQUET TIME:  An hour and 40 minutes at 350 mmHg.   OPERATIVE IMPLANTS:  Rotating [employer] sigma total knee replacement, rotating  platform. Size 3 femur, size 4 tibia, size 3, 10 mm thick tibial insert with  a 38 mm patella all cemented.   DESCRIPTION OF PROCEDURE:  The patient counseled in the holding area,  correct side was identified, IV was started, antibiotics were given.  Taken  to the OR where spinal was administered.  A Foley catheter was placed  utilizing sterile technique by the oral circulating nurse. The lower  extremity was well padded, right knee examined. Range of motion was 3 to  120, elevated prepped with duraprep and draped in a sterile fashion.  Exsanguinated with esmarch, tourniquet was inflated to 350 mmHg. A straight  midline incision was made through the skin and subcutaneous tissue, small  veins electrocoagulated.  Medial parapatellar arthrotomy was performed,  patella was everted, knee was flexed, end-stage arthritic change. I also did  a proximal to medial soft tissue release.  End-stage arthritis changes, bone  against bone, cruciate ligaments were resected, starting hole made in  the  distal femur, canal was irrigated, effluent was clear. The intramedullary  rod was gently placed. The femur was found to be a size #3.  It was set for  that and rotational marks were made. A distal femoral cutting block was then  applied and the distal femur was cut to fit a size #3. Medial and lateral  menisci removed under direct visualization, geniculate vessels were  coagulated, posterior neurovascular structures were thought of and protected  throughout the entire case.  The tibial eminence was resected, osteophytes  removed from the proximal medial tibia, proximal tibia was found to be a  size 4. The central aspect was marked, step reamer was utilized, canal was  irrigated, effluent was clear. The intramedullary rod was gently placed.  I  initially took a 10 mm cut off the proximal tibia based upon the lateral  side.  Posteromedial and posterolateral femoral osteophytes removed under  direct visualization. The femoral box was prepared in the standard fashion.  At this time with a size 3 femur, size 4 tibia, with the 10 rotating  platform insert, we had excellent range of motion,  soft tissue balance and  flexion extension.  The patella was found to be 24 mm thick. We resurfaced 9  mm off and then put in locking holes with the patella.  At this point, we  then finished the tibia side by making the keel and the punch. At this point  utilizing modern cement technique, components were cemented into place.  A  size 4 tibia, size 3 femur with a 38 patella. After the cement had cured,  all extra cement was removed and we implanted a size 3, 10 mm thick rotating  platform tibial insert. We now had excellent range of motion and alignment.  We also had to check alignment in full extension and well balanced knee did  not require lateral release. The wounds were copiously irrigated several  times. Two Hemovac drains were placed. The knee was then closed in  sequential layers, arthrotomy with  Vicryl, subcu Vicryl, skin closed with  Monocryl sutures. Steri-Strips were applied. Sponge and needle count were  correct. There were no complications or problems. The patient was then taken  from the operating room to PACU in stable condition. The patient was doing  well in the recovery room at the time of this dictation.      RAC/MEDQ  D:  08/26/2004  T:  08/26/2004  Job:  [id number]

## 2010-11-18 NOTE — Discharge Summary (Signed)
Matthew [name], Matthew [name]                          ACCOUNT NO.:  [id number]   MEDICAL RECORD NO.:  [id number]                   PATIENT TYPE:  INP   LOCATION:  2034                                 FACILITY:  [facility]   PHYSICIAN:  Learta Codding, M.D. 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  He has a history of coronary artery disease.  He had an  Inferior wall MI with stenting in 2003 performed at [facility]  He had two Taxus stents to his LAD in September 2004.  In  December 2004, the patient presented to the [facility] office with chest pain and  diaphoresis.  He was transferred to [facility] and taken to the  catheterization lab where he had stenting of the mid LAD performed by Dr.  Riley Kill.  Continued medical therapy was recommended following that, although  bypass surgery was discussed as an option.   HOSPITAL COURSE:  The patient was seen at [facility] on February 04, 2004, for evaluation of chest pain and transferred to [facility]  for further treatment.  He was taken to the catheterization laboratory on  February 04, 2004, at which time he was found to have a 50% thrombus in the  previous LAD stent.  The circumflex and RCA were okay.  The left ventricle  showed apical akinesis which was new with an ejection fraction of 40%.  The  patient had a thrombectomy and PTCA of the LAD, reducing the lesion from 50%  to 0%.  He did rule in for a non-Q-wave MI.  Long-term Plavix therapy was  recommended.  The patient had been on Plavix in the past; however, he  developed an esophageal erosion with bleeding and was taken off the Plavix  at some point.   Decision was made to discharge the patient on February 07, 2004, in stable and  improved condition.   LABORATORY AND X-RAY DATA:  A CBC on the day of discharge revealed a  hemoglobin of 13.7, hematocrit 39.7, WBC 6900, platelets 242,000.  Chemistry  profile on August 7 revealed BUN 12, creatinine 1.0, potassium 3.9, glucose  96.  Cardiac enzymes on August 5 revealed a CK of 531, MB 62, troponin 5.52.   DISCHARGE MEDICATIONS:  1. Enteric-coated aspirin 325 mg daily.  2. Plavix 75 mg daily.  3. Toprol XL 50 mg daily.  4. Nitroglycerin p.r.n. for chest pain.  5. Diovan 80 mg daily.  6. Vitorin 80/10 one at bedtime.  7. Prevacid as previously taken.  8. He was given a prescription by Dr. Andee Lineman for Tylenol No. 3, 1 to 2 every     4 to 6 hours, #  60, no refills.   ADDITIONAL DISCHARGE INSTRUCTIONS:  1. The patient was told to avoid any strenuous activity until cleared by Dr.     Andee Lineman.  2. He was to be on a low-salt, low-fat diet.  3. He was told to call the office if he had any increased pain, swelling, or     bleeding from his groin.  4. He had appointment with Dr. Andee Lineman on Tuesday, August 9, at 3:30 p.m.  5. He was to follow up with Dr. Dimas Aguas as needed or as scheduled.   PROBLEM LIST AT TIME OF DISCHARGE:  1. Cardiac catheterization February 04, 2004, revealing thrombus in the left     anterior descending stent with thrombectomy and percutaneous transluminal     cardiac angioplasty performed.  2. Apical akinesis which was new.  At time of catheterization, ejection     fraction was 40%.  3. Non-Q-wave myocardial infarction this admission.  4. History of hypertension.  5. Known coronary artery disease with previous stent to the left anterior     descending coronary artery.  6. Mild obesity.  7. History of hyperlipidemia.      Matthew [name], Matthew [name]                  Learta Codding, M.D. [facility]    DR/MEDQ  D:  02/07/2004  T:  02/07/2004  Job:  [id number]   cc:   Selinda Flavin, M.D.  [facility] [place]. [address]  [place], [place] [address]

## 2010-11-18 NOTE — Discharge Summary (Signed)
NAMEMARSHAUN, LORTIE              ACCOUNT NO.:  [id number]   MEDICAL RECORD NO.:  [id number]          PATIENT TYPE:  INP   LOCATION:  0468                         FACILITY:  [facility]   PHYSICIAN:  Erasmo Leventhal, M.D.DATE OF BIRTH:  [birth year]   DATE OF ADMISSION:  08/26/2004  DATE OF DISCHARGE:  08/31/2004                                 DISCHARGE SUMMARY   ADMITTING DIAGNOSIS:  End-stage osteoarthritis, right knee.   DISCHARGE DIAGNOSIS:  End-stage osteoarthritis, right knee.   OPERATION:  Total knee arthroplasty, right knee.   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 He has failed  conservative management of his knee and therefore opts to proceed with total  knee arthroplasty, right knee.  The surgery, risks, benefits, and after care  were discussed in detail with the patient.  Medical clearance was obtained  and surgery to go ahead as scheduled.   LABORATORY VALUES:  Admission CBC within normal limits.  He ran a mildly  elevated white count throughout admission to a high to 11.6.  Hemoglobin and  hematocrit reached a low of 11.5 and 32.7 on the 27th.  PT/PTT within normal  limits on admission.  By discharge, his INR was 2.5.  Admission CMET showed  sodium slightly low at 134, otherwise normal.  His sodium got to a low of  119 on the 27th and was back to 123 on the 28th.  Chloride got low to a low  of 89 and was back up to 90 on the 28th.  He ran mildly elevated glucoses up  to a high of 146 during admission and calcium was slightly low in the 8.1-  8.3 range.  TSH was normal.  Urinalysis normal.  Urine sodium 73.   HOSPITAL COURSE:  The patient tolerated the operative procedure well.  First  postoperative day, vital signs were stable.  Pain was significant and he was  changed to a Dilaudid PCA.  Hemoglobin and hematocrit remained stable.  Dressing was dry.  Drain was removed  without difficulty.  PT and OT were  started, as was CPM.   The second postoperative day, he was fairly comfortable.  We discussed  weaning over to p.o. medications and plan for that the next day.  His  dressing was changed.  The wound looked good.  No evidence of infection.  No  calf tenderness.  No cords.  Negative Homans' sign.  He was hyponatremic and  his fluids were changed to normal saline at [facility].  Fluid restrictions were  initiated.   The third postoperative day, he was feeling better.  Vital signs were  stable.  T-max 99.1.  Hemoglobin and hematocrit were stable.  Sodium was  down to 119 despite fluid restriction and switching to normal saline.  He  had some dorsal foot soreness on the left foot and left thumb CMC soreness  with no redness, swelling.  His calves were negative.  There was a question  of gout in his foot secondary to his clinical exam of pain.  We talked with  Dr. Drue Novel, who is on call for the medical service for his doctor, and ordered  a TSH, serum osmolality, and urine sodium.  He was also started on Lovenox  and his PT had not moved yet.  Medical consult was obtained for his  hyponatremia.   On the fourth postoperative day, he was feeling better.  His foot and his  knee had decreased pain.  His right shoulder showed some pain.  Vital signs  were stable.  He was afebrile.  Sodium was up to 123.  TSH was normal.  Serum osmolality was decreased at 251.  His INR was now up to 2.0.  X-ray of  his foot showed minimal degenerative changes.  Lungs were clear.  Calves  were negative.  Bowel sounds were normal.  Dressing change and wound was  benign.  The importance of watching him for stomach problems was discussed  due to his history of ulcers and he was advised to let us know immediately  of any gastric distress type symptoms or tarry, dark stools, and plans were  made for discharge the following day.   On the fifth postoperative day, he was feeling good, stated he  wanted to go  home.  Vital signs were stable, he was afebrile.  INR 2.5.  Sodium was back  up to 133.  Dressings changed.  Wounds benign.  Calves were negative.  Heart  sounds were normal.  Bowel sounds active.  The patient was subsequently set  up for discharge home.  We discussed at length the risks and benefits of  being on Celebrex, Coumadin, and aspirin at a low dose.  He understands the  increased risk of gastric bleeding and accepts those risks in order to  decrease his osteoarthritic pain and to protect his heart with the aspirin.  He will continue his proton pump inhibitor b.i.d. and if he has any signs of  gastric disturbances, to call us and his medical doctor, Dr. Cleotis Nipper,  immediately.   CONDITION ON DISCHARGE:  Improved.   DISCHARGE MEDICATIONS:  1.  Dilaudid 2 mg 1 q.4-6h. p.r.n. pain.  2.  Robaxin 500 p.o. q.8h. p.r.n. spasm.  3.  Coumadin per pharmacy protocol.  4.  Celebrex 200 mg q.d. with food.  5.  Aspirin 81 mg q.d. with food.  6.  Protonix.   DISCHARGE INSTRUCTIONS:  1.  He is to do his home CPM and home physical therapy.  2.  Return to the office to see [place] in 10 days.      SJC/MEDQ  D:  10/12/2004  T:  10/12/2004  Job:  [id number]

## 2010-11-18 NOTE — Cardiovascular Report (Signed)
NAMESIXTO, BOWDISH                          ACCOUNT NO.:  [id number]   MEDICAL RECORD NO.:  [id number]                   PATIENT TYPE:  INP   LOCATION:  6533                                 FACILITY:  [facility]   PHYSICIAN:  Arturo Morton. Riley Kill, M.D.             DATE OF BIRTH:  Nov 25, 1950   DATE OF PROCEDURE:  03/06/2003  DATE OF DISCHARGE:  03/11/2003                              CARDIAC CATHETERIZATION   INDICATIONS:  Acute coronary syndrome.   PROCEDURE:  1. Left heart catheterization.  2. Selective coronary arteriography.  3. Selective left ventriculography.   DESCRIPTION OF PROCEDURE:  The procedure was performed from the femoral  artery using 6-French catheters.  He tolerated the procedure well and there  were no complications.   HEMODYNAMIC DATA:  1. Central aorta 144/88, mean 112.  2. Left ventricle 139/12.  3. No gradient on pullback across the aortic valve.   ANGIOGRAPHIC DATA:  1. The left main coronary artery was free of critical disease.  2. The left anterior descending courses to the apex.  There is a long,     slightly hazy segmental stenosis of about 80% leading in the LAD just     before the origin of the septal and the diagonal.  The diagonal itself     has about 70-80% narrowing at the ostium.  Distal to this there is about     a 30-40% area of segmental plaquing in the mid left anterior descending     artery.  3. The circumflex provides a large bifurcating marginal branch which has     bifurcating disease of about 40% throughout its bifurcation extending     into the inferior subbranch.  There is also a small to moderate     posterolateral branch with an acute takeoff from the AV circumflex which     has probably 50-70% mid stenosis with bifurcation distally and a     relatively small myocardial territory.  4. The right coronary artery is a dominant vessel providing a posterior     descending and smaller posterolateral system.  The mid vessel is stented    with a 20% area at most of  narrowing in the stent.  There does not     appear to be significant restenosis.  5. Ventriculography in the RAO projection reveals overall well preserved     left ventricular function.  Estimated ejection fraction would be in the     range of 50%.  There is minimal hypokinesis in the mid anterolateral     segment.   CONCLUSIONS:  1. Preserved overall left ventricular function with mild hypokinesis of the     anterolateral segment.  2. No evidence of significant restenosis in the mid right coronary artery.  3. Moderate stenosis of the AV circumflex.  4. High grade stenosis of the proximal left anterior descending artery at     the left anterior descending  diagonal bifurcation.    PLAN/RECOMMENDATIONS:  Film review with consideration of percutaneous  coronary intervention.  Likely require bifurcation intervention versus  consideration of revascularization surgery.                                               Arturo Morton. Riley Kill, M.D.    TDS/MEDQ  D:  03/30/2003  T:  03/30/2003  Job:  [id number]   cc:   CV Lab

## 2010-11-18 NOTE — Assessment & Plan Note (Signed)
[facility]                          [name] OFFICE NOTE   NAME:[name], [name]               MRN:          [id number]  DATE:10/05/2006                            DOB:          02-25-1951    ADDENDUM:  The phone cut off on me and I was in the middle of dictating  his problem list. There is one problem left to be added to his problem  list.   PROBLEM LIST CONTINUED:  1. History of aortic root dilatation with transaxial dimension of the      ascending aorta 4 x 3.7 cm - felt to be borderline ectasia and not      aneurysmal dilatation.  2. History gastrointestinal bleeding on high-dose NSAIDs.   PLAN:  The patient presents to the office today for routine followup.  Overall he is doing well without symptoms of chest pain or shortness of  breath. He had some questions about Plavix for me today. I think given  his history of stent thrombosis in the past that he should remain on  Plavix indefinitely as well as aspirin. He seems to be tolerating this  well. He does have a history of GI bleeding but is not having any  problems at this point in time. He is on proton pump inhibitor and only  takes occasional NSAIDs from time to time for pain. He notes that his  cholesterol is checked by Dr. Dimas Aguas. We will try to obtain the most  recent results. This should continue to be followed by Dr. Dimas Aguas with a  goal LDL of less than 70. The patient's blood pressure is elevated  today. He notes that in the recent past his pressures have been normal,  less than 140/90. At this point in time, I have recommended we bring him  back on a couple of occasions for blood pressure checks. He is going to  reduce his salt and alcohol intake to help some of his blood pressure  control. I have also counseled him on diet today. If his blood pressures  remain greater than 140/90 on several occasions then we will need to up-  titrate his medications. I would consider  either placing him on Diovan  HCT 80/12.5 or increasing his Diovan dose to 150 mg a day. Will bring  him back in followup in 6 months.      Tereso Newcomer, PA-C       Matthew Abed, MD, [facility]    SW/MedQ  DD: 10/05/2006  DT: 10/05/2006  Job #: [id number]   cc:   [name]

## 2010-11-18 NOTE — Discharge Summary (Signed)
[name], [name]              ACCOUNT NO.:  [id number]   MEDICAL RECORD NO.:  [id number]          PATIENT TYPE:  INP   LOCATION:  1510                         FACILITY:  [facility]   PHYSICIAN:  Erasmo Leventhal, M.D.DATE OF BIRTH:  12-Jun-1951   DATE OF ADMISSION:  08/25/2005  DATE OF DISCHARGE:  08/27/2005                                 DISCHARGE SUMMARY   ADMISSION DIAGNOSIS:  End-stage osteoarthritis, left knee.   DISCHARGE DIAGNOSES:  1.  End-stage osteoarthritis, left knee.  2.  Hyponatremia.   BRIEF HISTORY:  This is a 60 year old gentleman well known to [place] with a  history of end-stage osteoarthritis both knees with previous total knee  arthroplasty of the right knee with good results. He has now failed  conservative measures to alleviate pain of his left knee and is scheduled  for total knee arthroplasty of the left knee. The surgery, its benefits and  aftercare were discussed in detail with the patient, questions provided and  answered, surgery to go ahead as scheduled.   LABORATORY DATA:  Admission CBC within normal limits with the exception of  the monocyte slightly high at 14, white count reached a high of 13.2 on the  24th and was back down to 11.8 on the 26th and hemoglobin and hematocrit  remained stable with a low of 12.2 and 35.9 on the 26th. Initial PT and PTT  within normal limits. PT and PTT at discharge showed 22.9 with an INR of  2.0. He was hyponatremic in the postop period as he was last time despite  additional preventive measures. He reached a low of 124 on the 25th and was  back up to 129 on the 27th. He also was mildly hypokalemic at 3.4 on the  25th and continued at 3.4 on the 27th. Chloride was mildly low on the 25th  and back to normal and he ran mildly elevated glucose with a high of 158  through admission. Also calcium levels were low through admission.  Glycosylated hemoglobin was 5.8. Initial CMET showed the ALT slightly high  at 49.  Urinalysis within normal limits.   HOSPITAL COURSE:  The patient tolerated the operative procedure well. The  first postoperative day, the patient was alert and oriented, vital signs  were stable, temperature 99.9. Sodium was 133. Calves were negative, drain  was removed without difficulty, neurovascular status was intact. A medical  consult was obtained to help manage the patient due to his significant  hyponatremia and hypokalemia at his last admission. IV fluid after seen by  the hospitalist was switched to normal saline and Lasix was initiated. The  second postoperative day, the patient continued to do well. Vital signs were  stable, he was afebrile. His sodium was down to 124, potassium at 3.4,  neurovascular status was intact, dressing was changed, wound was benign,  calves were negative and medical management of hyponatremia and hypokalemia  were discussed. The patient was also added on an aspirin a day to help  protect his stents as he had been on Plavix prior to admission. The third  postoperative day he was  aching all over, vital signs were stable, he was  afebrile. Sodium was 125, potassium 3.2. Lungs were clear, abdomen was  distended with sluggish bowel sounds, calves were negative, wound was  showing mild redness, negative induration or drainage. Heart sounds were  normal. He was placed on a laxative for his constipation, placed on  prophylactic Keflex for his wound redness. His PCA was changed to p.o.  Dilaudid. The fourth postoperative day he was feeling better, he stated that  he was having difficulty sleeping, bowel sounds were stable, he was  afebrile, O2 95 on room air. Sodium was coming back up at 129, potassium at  3.4. Bowel sounds were sluggish, heart sounds normal, calves negative, wound  with mild redness, negative induration or drainage and medical management  was continued for his hyponatremia and hypokalemia. On the fifth  postoperative day, the patient was  comfortable wanting to go home. Vital  signs were stable. Wound was looking excellent, calves were negative and the  patient was subsequently discharged home for followup in the office in 2  weeks.   CONDITION ON DISCHARGE:  Improved.   DISCHARGE MEDICATIONS:  1.  Coumadin per pharmacy protocol.  2.  Aspirin 81 mg 1 daily.  3.  Dilaudid 1 pill every 4-6 h as needed for pain.  4.  Robaxin 1 q.8 h p.r.n. spasm.  5.  Trinsicon once a day for anemia.  6.  Celebrex 1 pill a day for arthritic pain.   FOLLOW UP:  He is to followup with Dr. Dimas Aguas in 1 week for medical  evaluation and followup and he will not take his Plavix until he comes off  the Coumadin in 3 weeks.      Jaquelyn Bitter. Chabon, P.A.    ______________________________  Erasmo Leventhal, M.D.    SJC/MEDQ  D:  09/11/2005  T:  09/12/2005  Job:  [id number]   cc:   Selinda Flavin  Fax: [phone]

## 2010-11-18 NOTE — Discharge Summary (Signed)
[name], [name]                          ACCOUNT NO.:  [id number]   MEDICAL RECORD NO.:  [id number]                   PATIENT TYPE:  INP   LOCATION:  6533                                 FACILITY:  [facility]   PHYSICIAN:  Arturo Morton. Riley Kill, M.D.             DATE OF BIRTH:  February 14, 1951   DATE OF ADMISSION:  03/05/2003  DATE OF DISCHARGE:  03/11/2003                           DISCHARGE SUMMARY - REFERRING   DISCHARGING PHYSICIAN:  Dr. Riley Kill.   HISTORY OF PRESENT ILLNESS:  [name] is a 60 year old white male who was  seen in the office in [place] on March 05, 2003 by Dr. Andee Lineman. He was  complaining of chest discomfort, which he describes as sudden in onset and a  tightness without radiation. This can occur with rest and/or exercising and  is becoming progressively worse. Occasionally, he will have an aching down  his right arm. His history is notable for myocardial infarction and stenting  in 2003 at [facility], however, details are not  available.  His medical history, otherwise, is essentially benign except for  obesity.   LABORATORY DATA:  Three troponin's were negative for myocardial infarction.  CK total was 291 with an MB of 8.4 and relative index of 2.9. Subsequent CK  MB's were similar. Admission sodium was 138. Potassium 39. BUN 18.  Creatinine 0.9 and glucose 99. Hemoglobin and hematocrit 14.3 and 42.3.  Normal indices. Platelets 263,000. WBC is 7.3. PT of 12.4. PTT 26.  Subsequent chemistries are unremarkable. Subsequent hematologies were  unremarkable.   EKG showed sinus bradycardia, first degree AV block, non-specific STT wave  changes, possible old inferior myocardial infarction.   HOSPITAL COURSE:  [name] was admitted to [facility] for  anticipation of cardiac catheterization. Overnight, he did not have any  chest discomfort. On March 06, 2003 Dr. Riley Kill performed cardiac  catheterization. According to Dr. Rosalyn Charters  progress note, his ejection  fraction was normal. His RCA Stent did not have any re-stenosis'. He did  have a proximal 80% LAD involving the origin of a diagonal branch, which  also had an 80% proximal lesion. Dr. Riley Kill was reviewing the films with  the patient to decide if he should undergo intervention or possible CABG.  However, he was called to Dr. Jenene Slicker room for an emergency. Thus, his  catheterization was completed. Post sheath removal and bedrest, the patient  was ambulating without difficulty. Dr. Riley Kill reviewed the films with Dr.  Andee Lineman and felt that intervention was the patient's best option. Plavix was  initiated. He was continued on Heparin throughout the weekend. Over the  holiday weekend, the patient did not have any chest discomfort and he was  ambulating without difficulty. On March 10, 2003, Dr. Riley Kill deployed 2  TAXUS stents to the proximal LAD, jailing the diagonal 2 branch. No  intervention was performed on the diagonal 2. Post sheath removal and  bedrest,  the patient was complaining of back discomfort and being stiff.  After ambulation, this had improved. On review on March 11, 2003,  therefore, Dr. Riley Kill felt that the patient could be discharged home with  his jailed diagonal 2. Dr. Riley Kill placed him on Imdur.   DISCHARGE DIAGNOSES:  Unstable angina status post angioplasty stenting to  the left anterior descending with a jail diagonal 2. Continue medical  treatment.   DISPOSITION:  The patient is discharged home with new prescriptions for  Plavix 75 mg q.d. for 6 months and Imdur 30 mg q.d. He was asked to continue  his coated aspirin 325 mg q.d., Toprol XL 50 mg q.d., Diovan 80 q.d., Mobic  7.5 q.d., Glucosamine as previously, vitamin E 400 IU q.d., multivitamin  three times a day, lysine 500 mg q.d., Lipitor 400 mg q.h.s., vitamin B12,  B6, and folic acid q.d., and Nitroglycerin as needed. He was asked to bring  all medications to all  appointments.   ACTIVITY:  He was advised no lifting, no sexual activity, exertion, or  driving for 2 days. Maintain low-salt, low-fat, cholesterol diet.   SPECIAL INSTRUCTIONS:  If he has any problems with his catheterization site,  he was asked to call. He was asked to limit his beer intake to 2 a day. He  was also advised no MRI for 3 months.   FOLLOW UP:  He will see Dr. Andee Lineman in the office on Friday, March 20, 2003 at 10:30 a.m. in the [place] office.      Joellyn Rued, P.A. [facility]                    Thomas D. Riley Kill, M.D.    EW/MEDQ  D:  03/11/2003  T:  03/11/2003  Job:  [id number]   cc:   Learta Codding, M.D.  [address]  [address]  [place]  [place] [address]   Arturo Morton. Riley Kill, M.D.   Selinda Flavin  [address]  [place]  [place] [address]  Fax: [phone]

## 2010-11-18 NOTE — Cardiovascular Report (Signed)
NAMETREYVONNE, TATA                          ACCOUNT NO.:  [id number]   MEDICAL RECORD NO.:  [id number]                   PATIENT TYPE:  INP   LOCATION:  6533                                 FACILITY:  [facility]   PHYSICIAN:  Arturo Morton. Riley Kill, M.D.             DATE OF BIRTH:  Feb 23, 1951   DATE OF PROCEDURE:  03/10/2003  DATE OF DISCHARGE:  03/11/2003                              CARDIAC CATHETERIZATION   INDICATIONS:  Acute coronary syndrome.   PROCEDURE:  1. Percutaneous stenting of the left anterior descending artery.  2. Percutaneous angioplasty of the diagonal coronary artery.   DESCRIPTION OF PROCEDURE:  The patient was brought to the catheterization  laboratory and prepped and draped in the usual fashion following informed  consent.  Bivalirudin was used as the anticoagulant.  The patient was  pretreated with clopidogrel.  The patient target vessel was the LAD diagonal  bifurcation.  The 7-French system was used with a 7-French JL4.5 guiding  catheter.  A high-torque BMW wire was placed in the left anterior descending  artery and a high-torque traverse wire was placed in the diagonal after  informed consent.  The diagonal was initially dilated with a 2.0 x 15  balloon at 8 atmospheres.  There was early elastic recoil and no clear cut  definite benefit of dilatation of the side branch.  This was followed by a  2.25 cutting balloon which was taken up to about 3 atmospheres.  There was  some improvement in the appearance of the artery but it was felt that we  would need to go ahead and cross the LAD and therefore the wire was  subsequently removed.  The LAD vessel was then stented using a 2.5 x 16  Taxus drug eluting stent.  This was then post dilated with a 3 mm x 8  PowerSail balloon taken up to about 9 atmospheres.  The drug eluting stent  itself was deployed at 16 atmospheres.  The stent covered distally, but did  not appear to quite cover all the vessel proximally and  therefore a 2.5 x 8  stent Taxus drug eluting stent was placed more proximally and then post  dilated again with a 3 mm balloon up to 10 atmospheres.  There was marked  improvement in the appearance of the artery.  We then discussed the issue of  whether to leave the diagonal alone or whether to go back and redilate it.  The diagonal appeared to be stable and did not appear to have changed too  dramatically from the initial study and therefore we elected to leave this  alone.  All catheters were subsequently removed and the Angiomax  discontinued.  The femoral sheath was sewn into place and he was taken to  the holding area in satisfactory clinical condition.   ANGIOGRAPHIC DATA:  1. The left anterior descending artery demonstrates a segmental lesion of     about  80% that nearly crosses the diagonal and involves the diagonal to     some extent.  Following stenting this was reduced to 0% with residual     luminal narrowing.  The ostium of the LAD itself has mild to moderate     plaquing associated with some calcification.  The diagonal is seen best     in the LAO projections and is seen to be about 80% at the beginning of     the procedure.  At the completion of procedure this did not appear to be     substantially changed.  There was TIMI 3 flow distally.  There was a 40%     area of stenosis in the mid distal LAD which had been noticed previously     and was enhanced slightly by the vasodilatation produced by     nitroglycerin.  2. The circumflex was unchanged from the pre procedural tracing.  There was     about a 50-70% stenosis that involved the AV circumflex leading to a     small posterolateral system.  There is marked tortuosity of this vessel     proximally.   CONCLUSIONS:  Successful percutaneous stenting of the left anterior  descending artery with continued residual stenosis of the left anterior  descending diagonal.   DISPOSITION:  The patient will be treated medically.   We discussed the  possibility of approach of the diagonal but the final analysis was felt that  if we traversed back through the stent and dilated the diagonal again it  would likely result in at least partial displacement of the stent throughout  the major portion of the LAD which a wrap around LAD.  We therefore elected  to leave the diagonal as is and leave the LAD fully stented.                                               Arturo Morton. Riley Kill, M.D.    TDS/MEDQ  D:  03/30/2003  T:  03/30/2003  Job:  [id number]

## 2010-11-18 NOTE — H&P (Signed)
[name], [name]                          ACCOUNT NO.:  [id number]   MEDICAL RECORD NO.:  [id number]                   PATIENT TYPE:  INP   LOCATION:                                       FACILITY:  [facility]   PHYSICIAN:  Learta Codding, M.D.                 DATE OF BIRTH:  1950-12-25   DATE OF ADMISSION:  08/12/2003  DATE OF DISCHARGE:                                HISTORY & PHYSICAL   REFERRING PHYSICIAN:  Selinda Flavin, M.D.   REASON FOR ADMISSION:  Unstable angina.   HISTORY OF PRESENT ILLNESS:  [name] is a 60 year old male with a  history of coronary artery disease.  The patient is status post prior  coronary intervention at [facility] in 2003 in the setting of an  inferior wall myocardial infarction with stenting of the right coronary  artery.  More recently, in September of 2004 the patient underwent stenting  of the left anterior descending artery with percutaneous intervention of the  diagonal.  A bifurcational stenosis was treated.  However, he had a moderate  narrowing of the diagonal branch.  Initially he got along well, but he then  presented on June 19, 2003, with recurrence of substernal chest pain and  was brought back to the catheterization laboratory.  The study was performed  by Dr. Riley [name].  It was found that the patient had significant LAD disease  and he underwent successful stenting of the left anterior descending artery  in an overlapping fashion on the previous stent.  There was also high-grade  stenosis of the second diagonal branch with successful crossing with the  wire, but unsuccessful attempt to cross with the balloon through the  previously placed stent.  In addition, the patient had normal left  ventricular systolic function.  He had a  moderately enlarged aortic root.  There was no evidence of significant restenosis at the previous LAD stent  site.   The patient also has moderate stenosis of an AV circumflex.  The right  coronary artery did not demonstrate any end-stent restenosis.  After  hospital discharge, the patient did well, although he stated that on heavy  exertion on occasion he would experience substernal chest pain.  He came to  the office today because he has developed increased symptoms of substernal  chest pain.  He reports to me that particularly during cold days he  developed severe substernal chest pain upon exertion.  He also cut back his  exercise routine due to onset of exertional pain.  He also states that on  two separate occasions he woke up in the morning with sudden onset of severe  substernal chest pain, radiating down the left arm, associated with  shortness of breath, and lasting approximately 15-20 minutes.  The patient  has been taking more frequent nitroglycerin and typically takes  nitroglycerin during his workouts.  It is clear that  his anginal pattern as  been accelerating.  He denies any orthopnea or PND.  He has no palpitations  or syncope.   ALLERGIES:  No known drug allergies.   MEDICATIONS:  1. Toprol XL 50 mg a day.  2. Diovan 80 mg p.o. daily.  3. Mobic 7.5 mg p.o. daily.  4. Glucosamine chondroitin.  5. Vitamin E.  6. Omega 3000 daily.  7. Multivitamins.  8. Lysine 5 mg p.o. daily.  9. Enteric-coated aspirin 325 mg p.o. daily.  10.      B12, B6, and folic acid.  11.      Vytorin 10/80 mg p.o. daily.  12.      Imdur 60 mg p.o. daily.  13.      Plavix 75 mg p.o. daily.   PAST MEDICAL HISTORY:  As outlined in the HPI.   FAMILY HISTORY:  Notable for coronary artery disease.   SOCIAL HISTORY:  The patient lives in [place], [place], with his wife.  He does not smoke.   REVIEW OF SYSTEMS:  As per HPI.  No recent weight loss.  No fever or chills.  No melena or hematochezia.  No abdominal pain, orthopnea, or PND.  No  palpitations or syncope.   PHYSICAL EXAMINATION:  VITAL SIGNS:  The blood pressure is 122/80.  The  heart rate is 56 beats per minute.   WEIGHT:  233 pounds.  GENERAL APPEARANCE:  An overweight white male, but in no apparent distress.  HEENT:  The pupils are equal, round, and reactive to light.  NECK:  Supple.  Normal carotid upstroke.  No carotid bruits.  LUNGS:  Clear.  HEART:  Regular rate and rhythm.  Normal sinus.  No murmurs, rubs, or  gallops.  ABDOMEN:  Soft and nontender.  No rebound or guarding.  Good bowel sounds.  EXTREMITIES:  2+ peripheral pulses.  No cyanosis, clubbing, or edema.  NEUROLOGIC:  The patient is alert and oriented.  Grossly nonfocal.   LABORATORY DATA:  The 12-lead electrocardiogram with normal sinus rhythm,  small inferior Q waves, but no acute ischemic changes.  Laboratories are  currently pending.   IMPRESSION AND PLAN:  1. Substernal chest pain.  The patient clearly has chest pain in a     crescendo/accelerating pattern.  He has known significant coronary artery     disease and underwent several prior interventions both to the RCA and the     LAD.  During his last catheterization, he had an unsuccessful attempt at     inflating a balloon across the diagonal lesion.  It was [name]     feeling that if the patient had recurrent symptoms, surgery may well need     to be considered for this high-grade diagonal lesion.  The patient will     be referred for repeat cardiac catheterization as he could have a     possible end-stent restenosis or additional native coronary artery     disease.  I discussed the risks and benefits of the procedure with the     patient and he is willing to proceed.  The plan is to admit him today to     the hospital.  2. Hypertension, controlled.  3. Dyslipidemia on medical therapy.  4. Aortic root dilatation was documented on his last cardiac     catheterization.  The patient had a followup CT scan on August 04, 2003,     which showed __________ of the thoracic aortic at the level  just above    the aortic valve.  The transaxial dimension of the ascending  aorta is 4 x     3.7 cm.  At the level of the aortic arch on the right, the transaxial     dimension is 3.6 x 4.  At the level of the ascending aorta, the     transaxial dimension is 2.6 x 2.6.  It was felt to be borderline ectasia     and not aneurysmal dilatation.   DISPOSITION:  The patient will be referred for cardiac catheterization  tomorrow at [facility].  I have asked his wife to drive him to the  hospital tonight.                                                Learta Codding, M.D.    GED/MEDQ  D:  08/12/2003  T:  08/12/2003  Job:  [id number]   cc:   Selinda Flavin  [address]    [place] [address]  Fax: [phone]   Arturo Morton. [name], M.D.

## 2010-11-18 NOTE — Assessment & Plan Note (Signed)
[facility]                          [name] OFFICE NOTE   NAME:[name], [name]               MRN:          [id number]  DATE:10/19/2006                            DOB:          09/01/1950    Mr. Meaney was in today to have his blood pressure rechecked.  With the  automated cuff the blood pressure was markedly elevated at 160/95.  However, when I took the blood pressure by manual cuff, it was 128/84.  I reviewed the patient's medications. It does appear that he is  hypertensive at times and I have added hydrochlorothiazide 12.5 mg to  his regimen of Diovan as well as Toprol XL.  The patient will keep a log  of his blood pressure readings, will come back in 1 month for nurse  visit.  He also has scheduled a followup to see me regarding his  underlying coronary artery disease in the next couple of months.     Learta Codding, MD,FACC  Electronically Signed    GED/MedQ  DD: 10/19/2006  DT: 10/19/2006  Job #: [id number]

## 2010-11-18 NOTE — H&P (Signed)
[name], [name]              ACCOUNT NO.:  [id number]   MEDICAL RECORD NO.:  [id number]          PATIENT TYPE:   LOCATION:                                 FACILITY:   PHYSICIAN:  Erasmo Leventhal, M.D.DATE OF BIRTH:  28-Jan-1951   DATE OF ADMISSION:  08/25/2005  DATE OF DISCHARGE:                                HISTORY & PHYSICAL   CHIEF COMPLAINT:  Left knee end-stage osteoarthritis.   HISTORY OF PRESENT ILLNESS:  This is a 60 year old gentleman well known to  [place] with a history of end-stage osteoarthritis of both knees with previous  total knee arthroplasty of the right knee with good result.  He has now  failed conservative measures to alleviate the pain of the left knee and is  now scheduled for total knee arthroplasty of the left knee.  The surgery,  risks, benefits, and aftercare were discussed in detail with the patient,  questions invited and answered.  Surgery is scheduled.   ALLERGIES:  No known drug allergies.   CURRENT MEDICATIONS:  1.  Toprol XL 50 mg one q.a.m.  2.  Vytorin 10/80 one nightly.  3.  Diovan 80 mg one q.a.m.  4.  Celebrex 200 mg one p.o. every other day.  5.  Prilosec 20 mg one daily.  6.  Plavix 75 mg one daily.  7.  Aspirin one daily.   PAST SURGICAL HISTORY:  Appendectomy, elbow surgery, carpal tunnel release,  right total knee arthroplasty, lumbar laminectomy, and cardiac stent x2.   PAST MEDICAL HISTORY:  Coronary artery disease, hypertension, and  gastroesophageal reflux disease.   FAMILY HISTORY:  Positive for coronary artery disease and cancer.   SOCIAL HISTORY:  The patient is married.  He is a Surveyor, minerals.  He does not  smoke.  He drinks 1-3 drinks per day.   REVIEW OF SYSTEMS:  NERVOUS SYSTEM:  Negative for headache, blurred vision,  or dizziness.  PULMONARY:  Negative for shortness of breath, PND, orthopnea.  CARDIOVASCULAR:  Positive for history of MI x2.  No current chest pain or  angina.  GASTROINTESTINAL:  Positive for  history of GERD and some gastritis  secondary to Bextra.  No history of ulcers.  GENITOURINARY:  Negative for  urinary tract difficulty.  MUSCULOSKELETAL:  Positive as in HPI.   PHYSICAL EXAMINATION:  VITAL SIGNS:  Blood pressure 140/96, respirations 16,  pulse 58 and regular.  GENERAL:  This is a well-developed, well-nourished, gentleman in no acute  distress.  HEENT:  Head normocephalic and atraumatic, nose patent, ears patent, pupils  equal, round, and reactive to light, throat without injection.  NECK:  Supple without adenopathy.  Carotids 2+ without bruit.  CHEST:  Clear to auscultation.  No rales, or rhonchi.  Respirations 16.  HEART:  Regular rate and rhythm at 58 beats per minute without murmur.  ABDOMEN:  Soft with active bowel sounds.  No masses or organomegaly.  NEUROLOGY:  The patient is alert and oriented to time, place, and person.  Cranial nerves II-XII grossly intact.  EXTREMITIES:  Right knee with 0 to 115 degree range of motion and  excellent  stability.  The left knee shows a varus deformity 0 to 135 degree range of  motion with crepitation throughout the range of motion.  Neurovascular  status is intact.  Dorsalis pedis and posterior tibialis pulses are 2+.   X-rays show end-stage osteoarthritis of the left knee.   IMPRESSION:  End-stage osteoarthritis of the left knee.   PLAN:  Total knee arthroplasty of the left knee.      [name], P.A.    ______________________________  Erasmo Leventhal, M.D.    SJC/MEDQ  D:  08/16/2005  T:  08/16/2005  Job:  [id number]

## 2010-11-18 NOTE — H&P (Signed)
[name], [name]                          ACCOUNT NO.:  [id number]   MEDICAL RECORD NO.:  [id number]                   PATIENT TYPE:  INP   LOCATION:  3312                                 FACILITY:  [facility]   PHYSICIAN:  Learta Codding, M.D.                 DATE OF BIRTH:  [birth year]   DATE OF ADMISSION:  06/18/2003  DATE OF DISCHARGE:                                HISTORY & PHYSICAL   PHYSICIANS:  1. Learta Codding, M.D., cardiologist.  2. Arturo Morton. Riley Kill, M.D., cardiologist.   REASON FOR ADMISSION:  A 60 year old male with a history of coronary artery  disease, status post recent coronary intervention, now with recurrent  substernal chest pain.   HISTORY OF PRESENT ILLNESS:  [name] is a 60 year old male with a  history of coronary artery disease.  The patient underwent prior coronary  intervention at [facility] in 2003 in the setting of an  inferior wall myocardial infarction with stenting of the right coronary  artery.  The patient more recently was admitted in September when he  presented with crescendo angina.  He underwent cardiac catheterization by  Dr. Arturo Morton. Stuckey and was found to have high grade LAD lesion as well as  a first diagonal with an 80% stenosis.  The patient also had moderate  stenosis of the AV circumflex.  The decision was made to proceed with  percutaneous stenting of the left anterior descending artery and  percutaneous angioplasty of the diagonal artery.   The patient did well postprocedure.  He had two Taxus stents placed and had  no recurrent substernal chest pain.  He did make significant strides in  lifestyle changes and has lost approximately 20 pounds.  The patient,  however, today developed recurrent substernal chest pain somewhat similar in  nature to his prior presentation.  The patient felt a tightness in the chest  at rest with some radiation in the left arm and into the neck.  He felt  slight shortness of  breath.  Interestingly, he did admit during the history  that over the last several weeks he had a couple of brief episodes.  The  episode to date lasted approximately 30 minutes and went away spontaneously.  When it recurred the second time, that was when the patient decided to come  to the emergency room.   On admission to the ER, the patient had no acute ischemic changes.  He was  placed on heparin, nitroglycerin, and was transferred to [facility] for further management.   ALLERGIES:  No known drug allergies.   MEDICATIONS:  1. Toprol XL 50 mg q.d.  2. Diovan 80 mg q.d.  3. Lovenox 7.5 mg q.d.  4. Glucosamine q.d.  5. Vitamin E q.d.  6. Omega-3 fish oil 1000 mg p.o. q.d.  7. Multivitamin q.d.  8. Lysine 5 mg p.o. q.d.  9. Enteric-coated aspirin 325 mg q.d.  10.      Folic acid, B6, B12.  11.      Lipitor 40 mg p.o. q.h.s.  12.      Plavix 75 mg q.d.  13.      Imdur 30 mg p.o. q.d.   REVIEW OF SYMPTOMS:  Recent weight loss.  No fever, chills, melena, or  hematochezia.  No abdominal pain.  No orthopnea or PND.   PAST MEDICAL HISTORY:  As outlined above in the HPI.   FAMILY HISTORY:  Reviewed.   SOCIAL HISTORY:  Reviewed.   PHYSICAL EXAMINATION:  VITAL SIGNS:  Blood pressure is 164/94, heart rate 70  beats per minute, respirations 20, temperature 97.4.  GENERAL:  Overweight white male in no apparent distress.  NECK:  No JVD, jugular reflux.  Neck is supple.  No carotid bruits.  LUNGS:  Clear breath sounds bilaterally.  HEART:  Regular rate and rhythm.  Normal S1 and S2.  No murmurs, rubs, or  gallops.  ABDOMEN:  Soft and nontender.  No rebound or guarding.  Good bowel sounds.  EXTREMITIES:  There are 2+ peripheral pulses.  No clubbing, cyanosis, or  edema.   LABORATORY DATA:  A 12-lead electrocardiogram showed normal sinus rhythm,  poor R-wave progression, no acute ischemic changes.   INR is 0.9, glucose is 106, BUN 27, creatinine 0.8, calcium 8.8,  chloride  110.  Glucose 106, BUN 27, creatinine 0.8, chloride 110.  Sodium 139.  CK-MB  5.5, troponin 0.02.   IMPRESSION:  1. Substernal chest pain:  The patient's symptoms are somewhat reminiscent     of his pain during his prior presentation in September of 2004 when he     received two overlapping stents in the left anterior descending artery as     well as a percutaneous coronary intervention to the diagonal.  The     diagonal vessel was jailed during that procedure, but on poststent     angiography, it did not appear to have a worsened stenosis.  Eventually,     the decision was made not to redilatate the diagonal.  However, as I     explained to the patient, it is possible that he could have developed     some problems in this area or he could have coronary artery disease.  His     symptoms are somewhat suggestive and certainly repeat cardiac     catheterization appears to be indicated.  We have discussed the risks and     benefits of this procedure and he is willing to proceed.  2. Dyslipidemia:  The patient was placed recently on __________ 80 mg/10.     Reportedly, his cholesterol increased further.  We discussed therapeutic     lifestyle changes.  We will order a repeat lipid panel.  Consideration     may be given to HCL raising in this patient.  3. Rule out anemia:  Dr. Selinda Flavin requested homocystine level as well as     CRP which we have ordered, although this should not change the management     of this patient.  In the future, he is currently taking folate and     CRP is only a risk indicator.  The patient will need to change his     lifestyle and continue with aggressive lipid lowering control.   DISPOSITION:  The plans are to proceed with a cardiac catheterization  tomorrow.  Learta Codding, M.D.   GED/MEDQ  D:  06/18/2003  T:  06/19/2003  Job:  [id number]   cc:   Selinda Flavin  [address]  [place]  [place]  [address]  Fax: [phone]   Learta Codding, M.D.   Arturo Morton. Riley Kill, M.D.

## 2010-11-18 NOTE — H&P (Signed)
[name], [name]              ACCOUNT NO.:  [id number]   MEDICAL RECORD NO.:  [id number]           PATIENT TYPE:   LOCATION:                                 FACILITY:   PHYSICIAN:  Erasmo Leventhal, M.D.DATE OF BIRTH:  12-19-1950   DATE OF ADMISSION:  08/26/2004  DATE OF DISCHARGE:                                HISTORY & PHYSICAL   CHIEF COMPLAINT:  Right knee end stage osteoarthritis.   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  He has end stage  osteoarthritis.  He has failed conservative treatment for his knee and,  therefore, after a discussion of the treatment, benefits, risks and options,  he is now scheduled for total knee arthroplasty of the right knee.  He  understands the risks of DVT, infection, stiffness, prosthetic loosening,  PE, and opts to go with the surgery.  He has had a history of coronary  artery disease and has had a stent and has had medical clearance by his  medical doctor.  He relates today the bleeding ulcer in the spring and we  will get clearance from his gastroenterologist for him to be on Coumadin  postoperatively.   ALLERGIES:  None.   CURRENT MEDICATIONS:  Diovan 80 mg 1 q.a.m., Toprol XL 50 mg 1 q.a.m.,  Plavix 75 mg 1 p.o. daily, he has stopped that currently, Vytorin 10/80 1  q.h.s., multiple vitamins including fish oil, flax oil, glucosamine, B  vitamins and a multi-vitamin, Naprosyn 500 mg b.i.d., and Prevacid 1 daily.   PAST MEDICAL HISTORY:  Serious medical illnesses include hypertension  hypercholesterolemia, coronary artery disease, and bleeding ulcer disease.   PAST SURGICAL HISTORY:  Positive for stents, laminectomy, appendectomy, and  carpal tunnel release.   FAMILY HISTORY:  Positive for coronary artery disease, cancer, and  osteoarthritis.   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   REVIEW OF SYMPTOMS:  Central nervous system negative for headache, blurred  vision, or dizziness.  Pulmonary without orthopnea.  Cardiovascular positive  for coronary artery disease with history of MI and stent, negative for  angina.  GI positive for history of bleeding ulcers, no heartburn or reflux  currently.  GU negative for urinary tract difficulty.  Musculoskeletal:  As  per HPI.   PHYSICAL EXAMINATION:  VITAL SIGNS:  Blood pressure 140/78, respirations 16, pulse 64 and regular.  GENERAL:  This is a well developed, well nourished gentleman in no acute  distress.  HEENT:  Head normocephalic. Nose patent.  Ears patent.  Pupils equal, round,  reactive to light.  Throat without injection.  NECK:  Supple without adenopathy, carotids 2+ without bruits.  LUNGS:  Clear to auscultation, no rales or rhonchi, respirations 16.  HEART:  Regular rate and rhythm at 64 beats a minute without murmur.  ABDOMEN:  Soft, active bowel sounds, no masses or organomegaly.  NEUROLOGICAL:  Patient alert and oriented to time, place and person.  Cranial nerves  2-12 grossly intact.  EXTREMITIES:  The right knee has a varus deformity.  He has full extension  and further flexion to 125 degrees.  Neurovascular status intact.  Dorsalis  pedes and posterior tibialis pulses are 2+.   X-rays show end stage osteoarthritis of the right knee.   IMPRESSION:  End stage osteoarthritis of the right knee.   PLAN:  Total knee arthroplasty of the right knee.      ________________________________________  Jaquelyn Bitter. Chabon, P.A.  ___________________________________________  Erasmo Leventhal, M.D.    SJC/MEDQ  D:  08/15/2004  T:  08/15/2004  Job:  [id number]

## 2010-11-18 NOTE — Cardiovascular Report (Signed)
NAMEGRAY, DOERING                          ACCOUNT NO.:  [id number]   MEDICAL RECORD NO.:  [id number]                   PATIENT TYPE:  INP   LOCATION:  4709                                 FACILITY:  [facility]   PHYSICIAN:  Arturo Morton. Riley Kill, M.D.             DATE OF BIRTH:  Nov 10, 1950   DATE OF PROCEDURE:  06/19/2003  DATE OF DISCHARGE:                              CARDIAC CATHETERIZATION   PROCEDURES PERFORMED:  1. Left heart catheterization.  2. Selective coronary arteriography.  3. Selective left ventriculography.  4. Aortic root aortography.  5. Percutaneous intervention of the left anterior descending artery with     stenting of the mid left anterior descending and unsuccessful attempt to     cross the jailed diagonal branch with a balloon.   CARDIOLOGIST:  Arturo Morton. Riley Kill, M.D.   INDICATIONS:  Mr. Kindred is a delightful 60 year old gentleman who  presented with recurrent chest pain.  In September of this year he underwent  stenting of the left anterior descending artery with percutaneous  intervention of the diagonal; a bifurcational stenosis was treated.  However, he had a moderate narrowing of the diagonal branch.  He has gotten  along well, but now presents with recurrent chest pain and was brought back  to the cath lab for further evaluation.   DESCRIPTION OF THE PROCEDURE:  The patient was brought to the cath lab and  prepped and draped in the fashion.  Through an anterior puncture the right  femoral artery was easily entered.  A 6 French sheath was initially placed.  We used a JL-5 guiding catheter to inject the left coronary artery.  The  right coronary artery was entered with a standard Judkins catheter.  Ventriculography was performed in the RAO projection.  There was a moderate  generous size to the aortic root; and, therefore proximal root aortography  was subsequently performed.  Based upon the findings the patient had a  progressive lesion in the mid LAD  beyond the previous stented site and a  continued high-grade narrowing of the diagonal branch.  With this we talked  about the various options, which would include revascularization surgery.  The other option would be stenting of the LAD with an attempt to redilate  the diagonal.   The patient was in favor of the latter approach.  Preparations were then  made for percutaneous intervention.   A JL-5 guide was used.  Angiomax was used according to protocol and  subsequently an ACT was checked on several occasions, and found to be  appropriate.  We passed a Hi-Torque floppy into the distal LAD and then a 20  mm x 2,5 mm TAXUS drug-eluting stent was placed beyond the previously placed  two stents, and over the 80% stenosis.  This was then taken up to  approximately 11 atmospheres and then post dilated with a 2.75 mm PowerSail  balloon.  With this there was marked  improvement in the appearance of the  artery with a reduction in the stenosis from 80 to 0%.  The remainder of the  distal vessel appeared to be unchanged.  We then attempted to cross the  diagonal branch.  Multiple wires were attempted.  We were multiply able to  cross using a Traverse wire.  Despite crossing with the [facility] wire and  getting the [place] wire into the distal vessel, we were unable to get  either a Voyager balloon or a Maverick across the site.  With the Voyager  several attempts were made to cross, the wire eventually came out and we  were unable to successfully recross the vessel.  After a prolonged attempt  efforts were abandoned to try to recross.  The LAD remained intact and the  procedure was completed.   HEMODYNAMIC DATA:  The initial central aortic pressure was 120/76 with a  mean of 94.  The LV pressure was 118/12.  There was no gradient on pullback  across the aortic valve.   ANGIOGRAPHIC DATA:  1. Ventriculography:  Ventriculography was done in the RAO projection.     Ejection fraction was  estimated to be in excess of 60%.   1. Aortic Root:  The aortic root appeared to be smooth and there was no     evidence of dissection or aortic regurgitation; however, the aortic root     itself was moderately enlarged and appeared to be probably in the range     of about 4.5 cm.  This will need to be checked with a CT scan.   1. Left Main Coronary Artery:  The left main coronary artery was without     critical narrowing.   1. LAD:  The LAD courses to the apical tip.  Proximally there is about 30%     narrowing of the LAD.  There is a first diagonal that is without critical     narrowing.  The second diagonal is in the previously stented area.  There     is no evidence of any restenosis in the previous stent site; however, the     diagonal has a 90% area and then a 70% segmental disease in the proximal     vessel.  This is a moderate-sized diagonal.  Distal to the stent site in     the mid LAD is an 80% stenosis, which was stented and was reduced to 0%     residual luminal narrowing.  There is an 80% apical tip stenosis at the     very distal most aspect of the left anterior descending.   1. Circumflex Artery:  The circumflex demonstrates mild luminal irregularity     proximally and leads into a large marginal branch.  There is about a 50%     narrowing in the AV circumflex.   1. Right Coronary Artery:  The right coronary artery demonstrates no     evidence of restenosis at the stent site with about 30% residual     narrowing in the proximal midvessel.   CONCLUSIONS:  1. Preserved left ventricular function.  2. Moderately enlarged aortic root.  3. No evidence of significant restenosis at the previous left anterior     descending stent site.  4. Successful stenting of the mid left anterior descending artery in an     overlapping fashion from the previous stent.  5. High-grade stenosis of the second diagonal branch with successful    crossing with the wire, but  unsuccessful attempt  to cross with the     balloon through the previously placed stent.  6. Other findings as noted above.   The options at this point include either the revascularization surgery for s  single diagonal or continued medical therapy.  At the present I would favor  keeping the patient through Sunday.  If he has recurrent symptoms surgery  may need to be considered; but, at the present time I would be in favor of  an attempt at medical therapy as the patient was relatively asymptomatic  despite a high-grade diagonal at the completion of the last procedure.                                               Arturo Morton. Riley Kill, M.D.    TDS/MEDQ  D:  06/19/2003  T:  06/21/2003  Job:  [id number]   cc:   Selinda Flavin  [address]  [place]  [place] [address]  Fax: [phone]   Learta Codding, M.D.   [facility]

## 2010-11-18 NOTE — H&P (Signed)
[name], [name]                          ACCOUNT NO.:  [id number]   MEDICAL RECORD NO.:  [id number]                   PATIENT TYPE:  INP   LOCATION:  4709                                 FACILITY:  [facility]   PHYSICIAN:  [name], M.D.                 DATE OF BIRTH:  1951-03-02   DATE OF ADMISSION:  03/05/2003  DATE OF DISCHARGE:                                HISTORY & PHYSICAL   REFERRING PHYSICIAN:  Dr. Dimas [name].   REASON FOR CONSULTATION:  Chest pain.   HISTORY OF PRESENT ILLNESS:  This 60 year old white male was seen in the  office today by Dr. Andee [name].  The patient has a history of coronary artery  disease and suffered an MI with stenting in 2003 at [facility].  Details are unknown at this time.  He complains now of chest  discomfort which is sudden in onset, describes a tightness in the substernal  area without radiation.  This can occur at rest or with exercising.  It is  becoming progressively worse.  He will have an ache down his right arm.   Cardiac review of systems is positive for angina and MI, but negative for  heart failure, orthopnea, edema, shortness of breath, dyspnea on exertion,  PND, palpitations, claudication, syncope, presyncope, CVA, TIA.  Cardiac  risks are negative for hypertension, hyperlipidemia, or diabetes, or smoking  or family history of coronary disease.  He denies being under an increased  amount of stress.  He is active with exercise and he is overweight.   MEDICATIONS:  1. He is currently on Toprol XL 50 mg daily.  2. Diovan 80 mg daily.  3. Mobic 7.5 mg daily.  4. Glucosamine daily.  5. Vitamin E 400 mg daily.  6. Mega 3 1000 mg daily.  7. Multiple vitamin t.i.d.  8. Lysine 500 mg daily.  9. Enteric-coated aspirin 325 mg daily.  10.      Vitamin B12, B6, and folic acid daily.  11.      Lipitor 40 mg p.o. q.h.s.   ALLERGIES:  No known drug allergies.   REVIEW OF SYSTEMS:  He denies any recent weight gain,  weight loss, fever,  chills, melena, hematochezia.  Remainder of review of systems is  unremarkable.   PAST MEDICAL HISTORY:  Positive for him being overweight and history of  coronary artery disease with an MI and stenting in 2003 at [facility].   SOCIAL HISTORY:  He is married.  He does not drink alcohol or smoke tobacco.   FAMILY HISTORY:  Mother is alive at age 57.  Father id deceased.   PHYSICAL EXAMINATION:  VITAL SIGNS:  Blood pressure 142/80 right arm, 142/80  left arm.  Heart rate is 51, sinus bradycardia.  Weight is 244 pounds.  GENERAL:  Shows a white male, no acute distress.  HEENT:  Grossly  normal.  NECK:  Supple.  Carotids are equal and strong bilaterally without JVD or  bruits or detected thyromegaly or adenopathy.  LUNGS:  Clear to A&P with good bilateral chest expansion.  HEART:  Rhythm is regular and distant.  Normal S1, S2 without any murmurs,  rubs, or clicks.  ABDOMEN:  Soft, obese, nontender.  Bowel sounds are present.  EXTREMITIES:  Showed no edema.  Pedal pulses are intact.  NEUROLOGIC:  Nonfocal.   LABORATORY DATA:  EKG shows sinus bradycardia, heart rate 51, with  nonspecific ST/T-wave changes.   In 2003 his triglycerides were 160, cholesterol 171, HDL 54, and LDL was 85.   IMPRESSION:  1. Coronary artery disease, status post previous myocardial infarction and     stenting, [facility] last year, now with symptoms of     new onset resting angina.  2. Obesity.   PLAN:  The patient will be admitted for elective heart catheterization.         [name]. [name].                    [name], M.D.    MRD/MEDQ  D:  03/05/2003  T:  03/05/2003  Job:  [id number]

## 2010-11-18 NOTE — Discharge Summary (Signed)
NAMEJORDANNY, [name]                          ACCOUNT NO.:  [id number]   MEDICAL RECORD NO.:  [id number]                   PATIENT TYPE:  INP   LOCATION:  6532                                 FACILITY:  [facility]   PHYSICIAN:  Learta Codding, M.D.                 DATE OF BIRTH:  02-Jul-1951   DATE OF ADMISSION:  08/12/2003  DATE OF DISCHARGE:  08/14/2003                                 DISCHARGE SUMMARY   PROCEDURES:  1. Cardiac catheterization.  2. Coronary arteriogram.  3. Left ventriculogram.  4. Percutaneous transluminal coronary angioplasty to one vessel.   HOSPITAL COURSE:  [name] is a 60 year old male with known coronary  artery disease.  He had an inferior wall myocardial infarction with a stent  to the RCA is 2003 and a stent to the LAD in September of 2004.  On December  17, he had substernal chest pain and had a stent to the LAD that overlapped  the previous stent.  He attempted to treat high grade stenosis of the second  diagonal but was unable to cross the balloon through the previously placed  stent.  He was treated medically.  He comes back to the hospital on August 12, 2003, with chest pain consistent with unstable anginal pain.  He was  admitted to rule out myocardial infarction and for further evaluation.   His enzymes were negative for myocardial infarction, but it was felt that  repeat catheterization was indicated.  The cardiac catheterization showed no  critical in-stent restenosis and multiple lesions of 30 to 40% in the left  main LAD, circumflex and RCA.  There was no stenosis greater than 30% in the  left main.  The second diagonal branch was 90%, and Dr. Chales Abrahams was able to  perform PTCA to the second diagonal, reducing the stenosis to less than 40%.  His EF was 55% with mild apical hypokinesis.   Post-procedure, he was ambulating without chest pain or shortness of breath.  [name] was considered stable for discharge on August 14, 2003, and is  to  follow up as an outpatient with cardiology, and with his primary care  physician.   LABORATORY VALUES:  Hemoglobin 14.4, hematocrit 42.3, WBC 5.9, platelets  240, sodium 141, potassium 4.0, chloride 105, pO2 25, BUN 12, creatinine  0.9, glucose 104.  Post-procedure CK-MB 147/3.8.   Chest x-ray:  No evidence of acute cardiopulmonary process.  The cardial  pericardial silhouette was within normal limits, and the bony structures  were intact.   DISCHARGE CONDITION:  Improved.   DISCHARGE DIAGNOSES:  1. Unstable anginal pain, status post percutaneous transluminal coronary     angioplasty to the second diagonal this admission.  2. Status post percutaneous transluminal coronary angioplasty and stent to     the LAD in September of 2004, and an overlapping stent was placed in     December of 2004.  3. Status post myocardial infarction in 2003 with a stent to the RCA.  4. Hyperlipidemia.  5. Osteoarthritis.  6. Hypertension.  7. Aortic root dilatation with transaxial dimension of the ascending aorta 4     x 3.7 cm.  It was felt to be borderline ectasia and not aneurysmal     dilatation.  8. History of right elbow surgery, carpal tunnel surgery, appendicitis, and     disk operation.  9. Recent upper respiratory infection.   DISCHARGE INSTRUCTIONS:  1. His activity level is to include no driving or strenuous activity for two     days.  2. He is to stick to a diet that is low in fat, salt and cholesterol.  3. He is to observe the calf site for bruising, drainage, swelling or     discomfort.  4. He is to follow up with Dr. Andee Lineman on March 3, at 12:15 in [place], [place].  5. He is to follow up with Dr. Selinda Flavin as needed.   DISCHARGE MEDICATIONS:  1. Plavix 75 mg daily.  2. Aspirin 325 mg daily.  3. Toprol XL 50 mg daily.  4. Mobic 7.5 mg daily.  5. Imdur 60 mg daily.  6. __________10/80 mg daily.  7. Diovan 80 mg daily.  8. Glucosamine chondroitin.  9. Omega-3.  10.       Vitamins.  11.      Lycine as prior to admission.  12.      Nitroglycerin p.r.n.      Theodore Demark, P.A. [facility]                  Learta Codding, M.D.    RB/MEDQ  D:  08/14/2003  T:  08/14/2003  Job:  [id number]   cc:   [facility] [place], [facility]   Selinda Flavin  [address]  [place]  [place] [address]  Fax: [phone]

## 2010-11-18 NOTE — Assessment & Plan Note (Signed)
[facility]                          [name]ICE NOTE   NAME:Hessling, JEYDEN COFFELT               MRN:          [id number]  DATE:10/05/2006                            DOB:          1951/04/01    PRIMARY CARDIOLOGIST:  Dr. Andee Lineman.   PRIMARY CARE PHYSICIAN:  Dr. Selinda Flavin.   HISTORY OF PRESENT ILLNESS:  Mr. Bradish is a very pleasant 60 year old  male patient with a history of coronary artery disease, status post  multiple percutaneous coronary interventions including stenting to the  RCA in the remote past, as well as stenting to the LAD, and 3  overlapping stents to the LAD the last of which were placed in December  of 2004, and angioplasty of a diagonal branch in February 2005. His last  intervention occurred in August 2005 when he presented with acute  anterior ST elevation myocardial infarction secondary to stent  thrombosis within the proximal LAD stent. The patient underwent  successful aspiration, thrombectomy, and angioplasty. He had been off of  his Plavix secondary to recent GI bleeding. He was placed back on Plavix  and has been asked to remain on this indefinitely. The patient was last  seen in this office in December 2006 prior to upcoming knee surgery. The  patient had an exercise Cardiolite done in December 2006. This revealed  inferior defect consistent with prior infarct or scar but no signs of  ischemia. His ejection fraction at that time was 52%. His last  echocardiogram on record was in October 2005. At that time he had left  ventricular hypertrophy and good LV function. He returns to the office  today for routine followup.   The patient denies any chest pain or shortness of breath. Denies any  syncope or near syncope. He works out at [employer] several times a week  without any exertional chest heaviness or tightness. Denies any  orthopnea, or paroxysmal nocturnal dyspnea, or lower extremity edema. He  does have  sleep apnea, it on CPAP nightly. He denies any symptoms of  claudication.   CURRENT MEDICATIONS:  Include:  1. Prilosec 20 mg a day.  2. Toprol XL 50 mg a day.  3. Diovan 80 mg a day.  4. Vytorin 10/80 mg daily.  5. Folic acid.  6. B12.  7. B6.  8. Enteric coated aspirin 325 mg daily.  9. Glucosamine/chondroitin twice daily.  10.Multivitamin.  11.Fish oil.  12.Coenzyme Q 10.  13.Flax seed oil.  14.Plavix 75 mg daily.  15.CPAP nightly.   ALLERGIES:  No known drug allergies.   SOCIAL HISTORY:  Denies any tobacco abuse.   PHYSICAL EXAMINATION:  He is a well-nourished, well-developed, male in  no distress. Blood pressure 140/90, pulse 60, repeat blood pressure is  151/92 bilaterally, weight is 261.2 pounds.  HEENT: Unremarkable.  NECK: Without JVD.  CARDIAC: S1, S2. Regular rate and rhythm without murmurs.  LUNGS: Clear to auscultation bilaterally without wheezes, rhonchi, or  rales.  ABDOMEN: Soft, nontender with normoactive bowel sounds, no organomegaly.  EXTREMITIES: Without edema. Calves are soft, nontender.  SKIN: Warm and dry.  NEUROLOGIC: He is alert and oriented x3,  cranial nerves II-XII grossly  intact.  Dorsalis pedis and posterior tibialis pulses are 2+ bilaterally.   Electrocardiogram reveals sinus bradycardia with a heart rate of 56. No  acute changes, Q waves in V1-3.   IMPRESSION:  1. Coronary artery disease, status post multiple percutaneous coronary      interventions as outlined above.      a.     Last cardiac catheterization in August 2005 secondary to       acute anterior ST elevation myocardial infarction, secondary to       stent thrombosis.      b.     Status post thrombectomy and angioplasty for stent       thrombosis in the proximal LAD February 05, 2004.      c.     Result of catheterization: 50% stenosis within the stent in       the proximal LAD with thrombus, 40% diagonal branch of the LAD, no       major obstruction of the circumflex, less  than 20% narrowing of       the stent in the RCA.      d.     Stress Cardiolite December 2006 with no ischemia and an       ejection fraction of 52%.  2. Preserved LV function.      a.     Previous ejection fraction of 40% at time of his myocardial       infarction now improved to normal.  3. Treated dyslipidemia.  4. Hypertension, uncontrolled.  5. Obstructive sleep apnea on CPAP therapy.  6. Obesity.  7. Osteoarthritis, status post bilateral knee replacements.  8. History of aortic root dilatation with transaxial dimension of the      ascending aorta 4 x 3.7 cm - felt to be borderline ectasia and not      aneurysmal dilatation.  9. History gastrointestinal bleeding on high-dose NSAIDs.   PLAN:  The patient presents to the office today for routine followup.  Overall he is doing well without symptoms of chest pain or shortness of  breath. He had some questions about Plavix for me today. I think given  his history of stent thrombosis in the past that he should remain on  Plavix indefinitely as well as aspirin. He seems to be tolerating this  well. He does have a history of GI bleeding but is not having any  problems at this point in time. He is on proton pump inhibitor and only  takes occasional NSAIDs from time to time for pain. He notes that his  cholesterol is checked by Dr. Dimas Aguas. We will try to obtain the most  recent results. This should continue to be followed by Dr. Dimas Aguas with a  goal LDL of less than 70. The patient's blood pressure is elevated  today. He notes that in the recent past his pressures have been normal,  less than 140/90. At this point in time, I have recommended we bring him  back on a couple of occasions for blood pressure checks. He is going to  reduce his salt and alcohol intake to help some of his blood pressure  control. I have also counseled him on diet today. If his blood pressures remain greater than 140/90 on several occasions then we will need to  up-  titrate his medications. I would consider either placing him on Diovan  HCT 80/12.5 or increasing his Diovan dose to 160 mg a day. Will bring  him back in followup in 6 months.      Tereso Newcomer, PA-C  Electronically Signed      Luis Abed, MD, [facility]  Electronically Signed   SW/MedQ  DD: 10/05/2006  DT: 10/05/2006  Job #: [id number]   cc:   Selinda Flavin

## 2010-11-18 NOTE — Discharge Summary (Signed)
[name], [name]                          ACCOUNT NO.:  [id number]   MEDICAL RECORD NO.:  [id number]                   PATIENT TYPE:  INP   LOCATION:  4709                                 FACILITY:  [facility]   PHYSICIAN:  Learta Codding, M.D.                 DATE OF BIRTH:  1950/12/24   DATE OF ADMISSION:  06/18/2003  DATE OF DISCHARGE:  06/21/2003                                 DISCHARGE SUMMARY   HISTORY OF PRESENT ILLNESS:  This is a pleasant 60 year old white male  followed by Dr. Learta Codding in our group.  The patient has a known history  of coronary artery disease.  The patient underwent prior coronary  intervention at [facility] in 2003, secondary to  inferior wall myocardial infarction, with a stenting of his right coronary  artery.  The patient was recently admitted by our practice in September when  he presented with crescendo angina.  He underwent a cardiac catheterization  by Dr. Arturo Morton. Stuckey and was found to have a high-grade LAD lesion, as  well as the first diagonal with an 80% stenosis.  The patient also had  moderate stenosis of the AV circumflex.  The decision was made to proceed  with a percutaneous stenting of the LAD, and a percutaneous angioplasty of  the diagonal artery.  The patient did well postoperatively.  He had two  types of stents.  He had no recurrence of chest pain.  He has made life  style changes, including losing of over 20 pounds.  The patient did well  until the day of admission, when he felt chest pain similar to his prior  presentation with his myocardial infarction.  This tightness was felt at  rest, with radiation to his left arm and neck.  He was also slightly short  of breath.  At this point in time he decided to present to the emergency  room.   PAST MEDICAL HISTORY:  1. As listed above.  Significant for coronary intervention at [facility] in 2003, secondary to an inferior wall  infarction, the     stenting of right coronary artery and a recent cardiac catheterization by     Dr. Riley Kill, secondary to a high-grade LAD lesion.  2. A history of hypertension.  3. Osteoarthritis.   ALLERGIES:  No known drug allergies.   SOCIAL HISTORY:  The patient is married.  He does admit to moderate alcohol  use approximately three to four beverages several times per week.  He denies  ever using tobacco or illicit drugs.   FAMILY HISTORY:  His mother is alive at age 61.  Father is deceased, unknown  etiology.   HOSPITAL COURSE:  The patient was admitted to [facility] for further evaluation of his chest pain and known coronary artery  disease.  A 12-lead electrocardiogram  __________  ischemic changes.  Cardiac  enzymes were elevated.  The patient was taken for a cardiac catheterization  the next day.  The patient underwent a PCI of the LAD, a stent to the mid-  LAD, with a 2.5 Taxus stent.  Dr. Riley Kill was unable to cross through to the  stent to the diagonal.  He was able to get the wire but not the balloon, so  it was decided for him to stay through Sunday, and if he remained pain-free,  Dr. Riley Kill recommended medical management.  If recurrent pain develops, he  may need a coronary artery bypass graft surgery.  The patient was observed  for the next 24 hours.  He remained pain-free, and without any shortness of  breath.  He ambulated without difficulty.  His labs remained stable.   DISPOSITION:  He was set up for discharge on June 21, 2003.   LABORATORY DATA:  CBC on the day of admission showed a white count of 7.2,  hemoglobin 12.8, hematocrit 36.9, platelets 241.  PT was 13.0 and INR 1.0.  The BMET on June 20, 2003, showed a sodium of 140, potassium 3.6,  glucose 85, BUN 10, creatinine 0.9.  TSH was within normal limits at 1.199.  CRP was elevated at 4.6.  __________  normal at 8.36.   DISCHARGE MEDICATIONS:  1. The patient was told to  stay on the medications prior to admission,     including aspirin 325 mg one p.o. daily.  2. Plavix 75 mg one p.o. daily.  3. Toprol XL 50 mg one p.o. daily.  4. Diovan 80 mg one p.o. daily.  5. Mobic 7.5 mg one p.o. daily.  6. FOLTX one p.o. daily.  7. Vytorin 80/10 mg one p.o. q.h.s.  8. The patient was to restart Imdur 30 mg one p.o. daily.  He was receiving     this as an inpatient.   DISCHARGE INSTRUCTIONS:  The patient was told to avoid any strenuous  activity for two weeks.  Not to lift anything more than 10 pounds for one  week.   DIET:  He is to follow a low-fat, low-salt diet.   WOUND CARE:  He is to monitor his groin site for any bruising, swelling,  drainage, or discomfort, and to contact our office if this occurs.   FOLLOWUP:  He was told to follow up with our office, with Dr. Learta Codding,  in six weeks.  The office will call to make this appointment with him.  Also  to follow up with his primary care physician, Dr. Selinda Flavin.  The patient  is instructed to call for an appointment himself.   DISCHARGE DIAGNOSES:  1. Admission secondary to recurrent chest __________  acute myocardial     infarction, with a percutaneous coronary angiography to the left anterior     descending coronary artery __________  diagonal for stent.  2. Medical management of current lesions.  3. Hyperlipidemia.  4. Hypertension.      [place], [place]                       Learta Codding, M.D.    MP/MEDQ  D:  06/21/2003  T:  06/22/2003  Job:  [id number]   cc:   Selinda Flavin  [address]  [place]  [place] [address]  Fax: [phone]   Lewayne Bunting, M.D.

## 2010-11-18 NOTE — Cardiovascular Report (Signed)
[name], [name]                          ACCOUNT NO.:  [id number]   MEDICAL RECORD NO.:  [id number]                   PATIENT TYPE:  INP   LOCATION:  2931                                 FACILITY:  [facility]   PHYSICIAN:  Charlies Constable, M.D. [facility]              DATE OF BIRTH:  1950/12/06   DATE OF PROCEDURE:  DATE OF DISCHARGE:                              CARDIAC CATHETERIZATION   CLINICAL HISTORY:  Mr. Casella is 60 years old and has had multiple previous  percutaneous interventions.  He has had stenting in the right coronary  artery in the remote past and he has had stenting in the LAD.  He has three  overlapping Taxus stents in the LAD, the last of which was put in in  December of 2004.  In February of 2005 he had PTCA of a diagonal branch  which was pinched at the time of his last stenting.  He stopped his Plavix  in April after having a GI bleed.  Tonight at about 1910 he developed severe  chest pain and went to [facility] Emergency Room where his EKG showed  1 mm ST elevation laterally and inferiorly and he was given Integrilin,  aspirin, and heparin and transferred here.   PROCEDURE:  The procedure was performed via the right femoral artery using  arterial sheath and 6-French preformed coronary catheters.  A front wall  arterial puncture was performed and Omnipaque contrast was used.  After  completion of the diagnostic study we made a decision to proceed with  intervention on the in-stent thrombus within the proximal stent in the LAD.   The patient was on an Integrilin drip and was given additional heparin to  prolong the ACT greater than 200 seconds.  We also gave 300 mg of Plavix.  We used a Q4 6-French guiding catheter with side holes and an [employer] soft  wire.  We crossed the lesion in the proximal LAD with the wire without  difficulty.  We then used an Export aspiration catheter and performed two  runs and filled two syringes aspirating within the proximal of the  three  stents in the LAD.  This improved the appearance, but did not quite  normalize the filling defects.  We then went in with a 3.25 x 20 mm Quantum  Maverick and performed two inflations up to 18 atmospheres for 30 seconds.  Repeat diagnostic study was then performed through the guiding catheter.  The patient tolerated the procedure well and left the laboratory in  satisfactory condition.   RESULTS:  Left main coronary artery:  Free of significant disease.   Left anterior descending artery:  Gave rise to two diagonal branches and a  large septal perforator.  There were three overlapping stents beginning in  the proximal LAD just after the first diagonal branch and extending to the  mid LAD.  Within the most proximal stent there was a filling  defect which is  felt to represent a thrombus and there was moderate narrowing about a third  the way down where the septal diagonal arose.  There was about 40% narrowing  at the ostium of the diagonal branch which had been recently dilated in  February.   Circumflex artery:  Gave rise to a large marginal branch and a large  posterolateral branch.  These vessels were free of significant disease.   Right coronary artery:  Moderate sized vessel.  Gave rise to a conus branch,  a right ventricular branch, a posterior descending branch, and a small  posterolateral branch.  There was less than 20% narrowing at the stent site  in the mid right coronary artery.   LEFT VENTRICULOGRAM:  The left ventriculogram performed in the RAO  projection showed akinesis of the apex.  The anterolateral moved well and  the inferior wall moved well.  The estimated ejection fraction was 40%.  This wall motion abnormality was new from the prior study.   Following aspiration thrombectomy and balloon dilatation of the stent in the  proximal LAD the thrombus resolved and the stent diameter improved and the  flow improved from TIMI 2 to TIMI 3 flow.   CONCLUSIONS:  1.  Acute anterior wall myocardial infarction due to stent thrombosis within     the stent in the proximal left anterior descending with 50% stenosis     within the stent in the proximal left anterior descending with thrombus,     40% narrowing in the diagonal branch of the left anterior descending, no     major obstruction of circumflex artery, less than 20% narrowing at the     stent in the right coronary artery, and apical wall akinesis.  2. Successful aspiration thrombectomy and PTCA for stent thrombosis in the     proximal left anterior descending with improvement in center of narrowing     from 50% to 0% and improvement in the flow from TIMI 2 to TIMI 3 flow.   DISPOSITION:  The patient returned to postangiographic suite for further  observation.                                               Charlies Constable, M.D. [facility]    BB/MEDQ  D:  02/04/2004  T:  02/05/2004  Job:  [id number]   cc:   Selinda Flavin  [address]  [place]  [place] [address]  Fax: [phone]   Jonelle Sidle, M.D. [facility]

## 2010-11-18 NOTE — Cardiovascular Report (Signed)
[name], [name]                          ACCOUNT NO.:  [id number]   MEDICAL RECORD NO.:  [id number]                   PATIENT TYPE:  INP   LOCATION:  6532                                 FACILITY:  [facility]   PHYSICIAN:  Veneda Melter, M.D.                   DATE OF BIRTH:  09-01-1950   DATE OF PROCEDURE:  08/13/2003  DATE OF DISCHARGE:                              CARDIAC CATHETERIZATION   PROCEDURES PERFORMED:  1. Left heart catheterization.  2. Left ventriculogram.  3. Selective coronary angiography.  4. Percutaneous transluminal coronary angioplasty of the second diagonal     branch of the left anterior descending.   DIAGNOSES:  1. Coronary atherosclerotic disease.  2. Normal left ventricular systolic function.  3. Unstable angina.   CARDIOLOGIST:  Veneda Melter, M.D.   HISTORY:  [name] is a 60 year old white male with known history of  coronary artery disease who has previously undergone percutaneous  intervention of the mid right coronary artery as well as the proximal mid  LAD.  He has known jailing of the second diagonal branch that proved  difficult to intervene upon during prior interventions.  He has overall well-  preserved LV function.  He has unfortunately had crescendo angina that has  now become unstable prompting his admission to the hospital.  He has been  stabilized medically and is referred for further assessment.   TECHNIQUE:  Informed consent was obtained.  The patient was brought to the  catheterization lab.  A 6 French sheath was placed in the right femoral  artery using the modified Seldinger technique.  Six [place] JL-4 and JR-4  catheters were then used to engage the left and right coronary arteries.  Selective coronary angiography was performed in various projections using  manual injection of contrast.  A 6 French pigtail catheter was then advanced  in the left ventricle and a left ventriculogram performed using power  injection of  contrast.   FINDINGS:  The initial findings are as follows.  Left Main Trunk:  The left main trunk is a large caliber vessel with a  distal taper of 30%.   Left Anterior Descending:  The LAD is a large caliber vessel that provides  to diagonal branches in the proximal segment and then extends to the apex.  The LAD has a previously placed stent extending from the proximal to the  distal segment and it is widely patent.  The first diagonal branch is a  small caliber vessel that has mild disease.  The second diagonal branch is a  larger caliber vessel with a high-grade narrowing of 90% at its origin.   Left Circumflex Artery:  The left circumflex artery is a medium caliber  vessel that provides three marginal branches.  The AV circumflex has mild  diffuse disease extending into the first two marginal branches.  The third  marginal branch has moderate narrowing of 50%  in the proximal segment in the  proximal segment.   Right Coronary Artery:  The right coronary artery is dominant.  This is a  medium caliber vessel that provides a small posterior descending artery in  its terminal segment.  The right coronary artery has a previously placed  stent in the midsection that is widely patent.  There is mild disease of 30%  in the proximal RCA with mild diffuse disease of 30% in the distal vessel as  well.   Left Ventricle:  Normal end systolic and end diastolic dimensions.  Overall  left ventricular function is well-preserved.  Ejection fraction is greater  than 55%and there is very mild hypokinesis of the distal anterior and apical  walls.  No mitral regurgitation is noted.  LV pressure is 140/7, aortic is  135/85 and LVEDP equals 15.   With these findings we would like to proceed at an attempt st percutaneous  intervention to the second diagonal branch.   The patient had been pretreated with aspirin and Plavix, and was given  heparin to maintain an ACT of approximately 300 seconds.   A 6  [place] CLS-4 guide catheter was used to engage the left coronary artery.  A 0.014 inch [employer] wire was introduced.  This was advanced into  the distal section of the second diagonal branch and a 2.5 x 9 mm Maverick  balloon was used.  Unfortunately this could not be crossed into the second  diagonal branch.  A 0.014 inch Luge wire was introduced and advanced into  the second diagonal branch and the Maverick balloon placed on top this using  the [employer] wire as a buddy wire.  The balloon was finally, after some  manipulation, advanced into the proximal segment of the diagonal branch.  Two inflations were performed at eight atmospheres for 30 seconds with  repeat angiography showing modest improvement in vessel lumen, and  suggestion of small dissection, but low along the lateral wall.  A 2.75 x 12  mm Quantum Maverick balloon was then introduced and two inflations performed  in the proximal segment of the second diagonal branch across the ostium to  further dilate the stent struts at 12 atmospheres for 90 and 120 seconds.   Repeat angiography performed after the administration of intracoronary  nitroglycerin now showed an excellent result with significant improvement in  vessel lumen to perhaps 30-40% residual narrowing,  there was no evidence of  vessel compromise or dissection, and TIMI III flow through the diagonal  branch.  There was slight deformation of the stent in the mid LAD without  significant compromise of lumen.  These deemed and acceptable result.   The guide catheter  was then removed and the sheath secured into position.   The patient tolerated the procedure well and was transferred to the floor in  stable condition.   FINAL RESULTS:  Successful percutaneous transluminal coronary angioplasty of  the second diagonal branch with the reduction of 90% ostial narrowing to  less than 40% using balloon angioplasty.  ASSESSMENT AND PLAN:  [name] is a  60 year old gentleman with advanced  coronary artery disease.  He has had recurrent symptoms due to jailing of  the second diagonal branch from previously placed stents.  Percutaneous  intervention has been performed.  Unfortunately the stent does not appear to  have been fully  endothelialized and thus we were reluctant to place a second stent in this  bifurcation.  However, should the patient have  restenosis or recoil  consideration will be given towards placement of a stent into the second  diagonal branch, which will most probably require kissing balloon technique  into the left anterior descending proper.                                               Veneda Melter, M.D.    NG/MEDQ  D:  08/13/2003  T:  08/14/2003  Job:  [id number]   cc:   Selinda Flavin  [address]  [place]  [place] [address]  Fax: [phone]   Learta Codding, M.D.   Arturo Morton. Riley Kill, M.D.

## 2010-12-02 HISTORY — PX: CARPAL TUNNEL RELEASE: SHX101

## 2010-12-13 ENCOUNTER — Ambulatory Visit (HOSPITAL_BASED_OUTPATIENT_CLINIC_OR_DEPARTMENT_OTHER)
Admission: RE | Admit: 2010-12-13 | Discharge: 2010-12-13 | Disposition: A | Discharge status: Home / Self Care | Payer: BC Managed Care – PPO | Source: Ambulatory Visit | Attending: Orthopedic Surgery | Admitting: Orthopedic Surgery

## 2010-12-13 DIAGNOSIS — G5600 Carpal tunnel syndrome, unspecified upper limb: Secondary | ICD-10-CM | POA: Insufficient documentation

## 2010-12-13 DIAGNOSIS — E669 Obesity, unspecified: Secondary | ICD-10-CM | POA: Insufficient documentation

## 2010-12-13 DIAGNOSIS — I10 Essential (primary) hypertension: Secondary | ICD-10-CM | POA: Insufficient documentation

## 2010-12-13 DIAGNOSIS — G4733 Obstructive sleep apnea (adult) (pediatric): Secondary | ICD-10-CM | POA: Insufficient documentation

## 2010-12-13 DIAGNOSIS — Z01812 Encounter for preprocedural laboratory examination: Secondary | ICD-10-CM | POA: Insufficient documentation

## 2010-12-13 DIAGNOSIS — I2510 Atherosclerotic heart disease of native coronary artery without angina pectoris: Secondary | ICD-10-CM | POA: Insufficient documentation

## 2010-12-15 NOTE — Op Note (Signed)
[name]              ACCOUNT NO.:  [id number]  MEDICAL RECORD NO.:  [id number]  LOCATION:                                 FACILITY:  PHYSICIAN:  Katy Fitch. Eddis Pingleton, M.D.      DATE OF BIRTH:  DATE OF PROCEDURE:  12/13/2010 DATE OF DISCHARGE:                              OPERATIVE REPORT   PREOPERATIVE DIAGNOSIS:  Significant left carpal tunnel syndrome.  POSTOPERATIVE DIAGNOSIS:  Significant left carpal tunnel syndrome.  OPERATION:  Release of left transverse carpal ligament.  SURGEON:  Katy Fitch. Aloni Chuang, MD  ASSISTANT:  Annye Rusk, PA-C  ANESTHESIA:  General by LMA.  SUPERVISING ANESTHESIOLOGIST:  Quita Skye. Krista Blue, MD  INDICATIONS:  [name] is a 60 year old gentleman referred through the courtesy of Dr. Selinda Flavin of [place], [place] for evaluation and management of bilateral hand numbness.  [name] relates a history of a right carpal tunnel release performed in 2002.  He subsequently has developed recurrent bilateral numbness.  Dr. Dimas Aguas referred him for a detailed upper extremity orthopedic consult. Clinical examination suggested probable bilateral carpal tunnel syndrome and right ulnar neuropathy.  Electrodiagnostic studies completed by Dr. Wadie Lessen revealed evidence of very significant left carpal tunnel syndrome, very significant right ulnar neuropathy at the cubital tunnel with conduction velocity of 40 min/second across the elbow and recurrent right carpal tunnel syndrome.  We provided informed consent advising [name] to consider primary release of the left transverse carpal ligament and consideration of decompression and/or transposition of the ulnar nerve at the right cubital tunnel and re-exploration of the right carpal tunnel.  He elects to proceed with surgery on the left side at this time. Preoperatively, he was reminded of potential risks and benefits of surgery.  He was interviewed by Dr. Krista Blue of Anesthesia.   General anesthesia by LMA technique was recommended and accepted by Mr. Konen.  PROCEDURE:  [name] is brought to room 2 of the [facility] and placed in supine position on the operating table.  Following the induction of general anesthesia by LMA technique under Dr. Robina Ade direct supervision, the left arm was prepped with Betadine soap solution and sterilely draped.  A pneumatic tourniquet was applied to the proximal left brachium.  Following exsanguination of the left arm with Esmarch bandage, the arterial tourniquet was inflated to 250 mmHg.  Procedure commenced with short incision in line of the ring finger and the palm.  Subcutaneous tissues were carefully divided revealing the palmar fascia.  This was split longitudinally to reveal the common sensory branch of the median nerve and superficial palmar arch.  The carpal canal was sounded with a [place]field 4 elevator followed by release of the transverse carpal ligament along its ulnar border extending into the distal forearm with scissors.  This widely opened the carpal canal.  Bleeding points were meticulously electrocauterized due to his use of aspirin and Plavix.  No bleeding problems were encountered.  The wound was then repaired with intradermal 3-0 Prolene suture.  A compressive dressing was applied with a volar plaster splint maintaining the wrist in 5 degrees of dorsiflexion.  For aftercare, [name] is provided prescription for Percocet 5 mg  one p.o. q. 4-6 hours p.r.n. pain 20 tablets without refill.  We will see him back in followup in 1 week.  He is encouraged to elevate his hand above his heart for the next 48 hours.     Katy Fitch Michille Mcelrath, M.D.     RVS/MEDQ  D:  12/13/2010  T:  12/14/2010  Job:  [id number]  cc:   Selinda Flavin, MD  Electronically Signed by Josephine Igo M.D. on 12/15/2010 09:25:12 AM

## 2011-06-01 ENCOUNTER — Other Ambulatory Visit: Payer: Self-pay | Admitting: Orthopedic Surgery

## 2011-06-05 ENCOUNTER — Encounter (HOSPITAL_BASED_OUTPATIENT_CLINIC_OR_DEPARTMENT_OTHER): Payer: Self-pay | Admitting: *Deleted

## 2011-06-05 NOTE — Progress Notes (Signed)
Arrive 6am-needs istat-to bring cpap dos Here 10/12

## 2011-06-05 NOTE — H&P (Addendum)
[name] is an 60 y.o. male.   Chief Complaint: c/o persistent numbness and tingling in both median and ulnar distribution of right hand. HPI: pt is a 60 y/o right handed male with a long history of progressive N/T right hand. He previously underwent left CTR  With good results. NCV studies revealed right CTS and ulnar neuropathy at the cubital tunnel. He wishes to proceed with Right CTR and decompression ulnar nerve at the cubital tunnel.  Past Medical History  Diagnosis Date  . Sleep apnea   . Coronary artery disease   . Hypertension   . Peripheral vascular disease   . Myocardial infarction   . Arthritis   . Hyperlipemia     Past Surgical History  Procedure Date  . Appendectomy   . Cholecystectomy   . Joint replacement     rt total knee-lt total knee  . Total knee arthroplasty 2006    rt  . Total knee arthroplasty 2007    lt  . Coronary angioplasty     last2005  . Elbow arthroscopy     rt-bursa removed  . Shoulder arthroscopy 4/11    rt  . Cervical fusion 9/10  . Coronary angioplasty with stent placement 1610,9604  . Carpal tunnel release 6/12    lt    History reviewed. No pertinent family history. Social History:  reports that he quit smoking about 8 years ago. He does not have any smokeless tobacco history on file. He reports that he drinks alcohol. He reports that he does not use illicit drugs.  Allergies: No Known Allergies  No current facility-administered medications on file as of .   Medications Prior to Admission  Medication Sig Dispense Refill  . aspirin 325 MG tablet Take 325 mg by mouth daily.        Marland Kitchen atorvastatin (LIPITOR) 10 MG tablet Take 10 mg by mouth daily.        . clopidogrel (PLAVIX) 75 MG tablet Take 75 mg by mouth daily.        . fish oil-omega-3 fatty acids 1000 MG capsule Take 2 g by mouth daily.        Marland Kitchen losartan-hydrochlorothiazide (HYZAAR) 100-25 MG per tablet Take 1 tablet by mouth daily.        . metoprolol tartrate  (LOPRESSOR) 25 MG tablet Take 25 mg by mouth 2 (two) times daily.        . Multiple Vitamin (MULTIVITAMIN) capsule Take 1 capsule by mouth daily.        . naproxen (NAPROSYN) 500 MG tablet Take 500 mg by mouth 2 (two) times daily with a meal.        . tadalafil (CIALIS) 20 MG tablet Take 20 mg by mouth daily as needed.          No results found for this or any previous visit (from the past 48 hour(s)).  No results found.   Pertinent items are noted in HPI.  There were no vitals taken for this visit.  General appearance: alert Head: Normocephalic, without obvious abnormality Neck: supple, symmetrical, trachea midline Resp: clear to auscultation bilaterally Cardio: regular rate and rhythm, S1, S2 normal, no murmur, click, rub or gallop GI: normal findings: bowel sounds normal Extremities: postive Phalens and Tinels right wrist. Positive tinels and hyperflexion right elbow. NCV studies revealed right CTS and right ulnar neuropathy at the cubital tunnel. Pulses: 2+ and symmetric Skin: normal Neurologic: Grossly normal    Assessment/Plan Right CTS and right ulnar  neuropathy at cubital tunnel.  Right CTR and decompression ulnar nerve at the cubital tunnel right elbow.  Suni Jarnagin JR,Emalene Welte V 06/05/2011, 8:39 PM    H&P documentation: 06/06/11  -History and Physical Reviewed  -Patient has been re-examined  -No change in the plan of care  Wyn Forster, MD

## 2011-06-05 NOTE — H&P (Signed)
    PATIENT:  [name]                   SEEN BY: Katy Fitch. Slaton Reaser, Montez Hageman MD   OFFICE VISIT:    6.20.12 DOB:  [birth year]  [name] returns today for recheck of his left hand following left carpal tunnel release.  Overall he is doing quite well with minimal pain. He states that the sensibility has returned to an essentially normal level.  He is sleeping much better at night.    On examination his dressing is clean, dry and intact.  This is removed and the wound is inspected and appears to be healing quite nicely.  There are no signs of infection.  Neurovascularly he is intact.  He has excellent range of motion elbow, wrist and fingers.    PLAN:  At this time we had a lengthy discussion concerning continued treatment.  We removed his suture after prep with alcohol.  He will use his hand for light activities at home.  He will return to see [place] in three weeks time and at that time we will initiate a strengthening program.  In regards to his right hand he has well documented right carpal tunnel syndrome in addition to cubital tunnel syndrome at the right elbow.  He will need to undergo right carpal tunnel release and decompression of the ulnar nerve at the cubital tunnel at some time in the near future. He states he will probably wait until early fall to get this done.    _______________________________ Katy Fitch. Naaman Plummer., M.D.(RD)cmf T:   6.21.12                          RE: [name]      Seen by: Katy Fitch. Naaman Plummer., MD   OFFICE VISIT    01-11-11   DOB 01-03-2051  [name] returned for follow up evaluation of his left carpal tunnel release. He is now 4 weeks post op. He has full ROM of his fingers and thumb. He is delighted with his relief of numbness. He has persistent numbness on the right.   His electrodiagnostic studies have shown right carpal tunnel syndrome and right cubital tunnel syndrome.  At some point in the future he will call back and schedule  release of his right transverse carpal ligament and decompression of his right ulnar nerve at the cubital tunnel. I will see him back for follow up on a PRN basis.  RVS/phe T: 01-12-11

## 2011-06-06 ENCOUNTER — Ambulatory Visit (HOSPITAL_BASED_OUTPATIENT_CLINIC_OR_DEPARTMENT_OTHER)
Admission: RE | Admit: 2011-06-06 | Discharge: 2011-06-06 | Disposition: A | Discharge status: Home / Self Care | Payer: BC Managed Care – PPO | Source: Ambulatory Visit | Attending: Orthopedic Surgery | Admitting: Orthopedic Surgery

## 2011-06-06 ENCOUNTER — Encounter (HOSPITAL_BASED_OUTPATIENT_CLINIC_OR_DEPARTMENT_OTHER)
Admission: RE | Disposition: A | Discharge status: Home / Self Care | Payer: Self-pay | Source: Ambulatory Visit | Attending: Orthopedic Surgery

## 2011-06-06 ENCOUNTER — Encounter (HOSPITAL_BASED_OUTPATIENT_CLINIC_OR_DEPARTMENT_OTHER): Payer: Self-pay | Admitting: Certified Registered"

## 2011-06-06 ENCOUNTER — Encounter (HOSPITAL_BASED_OUTPATIENT_CLINIC_OR_DEPARTMENT_OTHER): Payer: Self-pay | Admitting: Orthopedic Surgery

## 2011-06-06 ENCOUNTER — Ambulatory Visit (HOSPITAL_BASED_OUTPATIENT_CLINIC_OR_DEPARTMENT_OTHER): Payer: BC Managed Care – PPO | Admitting: Certified Registered"

## 2011-06-06 DIAGNOSIS — I252 Old myocardial infarction: Secondary | ICD-10-CM | POA: Insufficient documentation

## 2011-06-06 DIAGNOSIS — I2510 Atherosclerotic heart disease of native coronary artery without angina pectoris: Secondary | ICD-10-CM | POA: Insufficient documentation

## 2011-06-06 DIAGNOSIS — G5620 Lesion of ulnar nerve, unspecified upper limb: Secondary | ICD-10-CM | POA: Insufficient documentation

## 2011-06-06 DIAGNOSIS — G5600 Carpal tunnel syndrome, unspecified upper limb: Secondary | ICD-10-CM | POA: Insufficient documentation

## 2011-06-06 DIAGNOSIS — I739 Peripheral vascular disease, unspecified: Secondary | ICD-10-CM | POA: Insufficient documentation

## 2011-06-06 DIAGNOSIS — G4730 Sleep apnea, unspecified: Secondary | ICD-10-CM | POA: Insufficient documentation

## 2011-06-06 DIAGNOSIS — I10 Essential (primary) hypertension: Secondary | ICD-10-CM | POA: Insufficient documentation

## 2011-06-06 HISTORY — DX: Sleep apnea, unspecified: G47.30

## 2011-06-06 HISTORY — DX: Hyperlipidemia, unspecified: E78.5

## 2011-06-06 HISTORY — PX: CARPAL TUNNEL RELEASE: SHX101

## 2011-06-06 HISTORY — PX: ULNAR NERVE TRANSPOSITION: SHX2595

## 2011-06-06 HISTORY — DX: Acute myocardial infarction, unspecified: I21.9

## 2011-06-06 HISTORY — DX: Unspecified osteoarthritis, unspecified site: M19.90

## 2011-06-06 HISTORY — DX: Peripheral vascular disease, unspecified: I73.9

## 2011-06-06 LAB — POCT I-STAT, CHEM 8
BUN: 32 mg/dL — ABNORMAL HIGH (ref 6–23)
Calcium, Ion: 1.12 mmol/L (ref 1.12–1.32)
TCO2: 24 mmol/L (ref 0–100)

## 2011-06-06 SURGERY — CARPAL TUNNEL RELEASE
Anesthesia: General | Site: Wrist | Laterality: Right | Wound class: Clean

## 2011-06-06 MED ORDER — CEFAZOLIN SODIUM 1-5 GM-% IV SOLN: 1.0000 g | Freq: Once | INTRAVENOUS | Status: DC

## 2011-06-06 MED ORDER — MIDAZOLAM HCL 5 MG/5ML IJ SOLN
INTRAMUSCULAR | Status: DC | PRN
  Administered 2011-06-06: 1.5 mg via INTRAVENOUS

## 2011-06-06 MED ORDER — LACTATED RINGERS IV SOLN
INTRAVENOUS | Status: DC
  Administered 2011-06-06 (×2): via INTRAVENOUS

## 2011-06-06 MED ORDER — PROPOFOL 10 MG/ML IV EMUL
INTRAVENOUS | Status: DC | PRN
  Administered 2011-06-06: 240 mg via INTRAVENOUS

## 2011-06-06 MED ORDER — FENTANYL CITRATE 0.05 MG/ML IJ SOLN
INTRAMUSCULAR | Status: DC | PRN
  Administered 2011-06-06: 50 ug via INTRAVENOUS
  Administered 2011-06-06: 25 ug via INTRAVENOUS
  Administered 2011-06-06: 50 ug via INTRAVENOUS

## 2011-06-06 MED ORDER — ONDANSETRON HCL 4 MG/2ML IJ SOLN
INTRAMUSCULAR | Status: DC | PRN
  Administered 2011-06-06: 4 mg via INTRAVENOUS

## 2011-06-06 MED ORDER — DEXAMETHASONE SODIUM PHOSPHATE 4 MG/ML IJ SOLN
INTRAMUSCULAR | Status: DC | PRN
  Administered 2011-06-06: 4 mg via INTRAVENOUS

## 2011-06-06 MED ORDER — OXYCODONE-ACETAMINOPHEN 5-325 MG PO TABS: ORAL_TABLET | ORAL | Status: DC

## 2011-06-06 MED ORDER — CEFAZOLIN SODIUM-DEXTROSE 2-3 GM-% IV SOLR: 2.0000 g | Freq: Once | INTRAVENOUS | Status: DC

## 2011-06-06 MED ORDER — LIDOCAINE HCL (CARDIAC) 20 MG/ML IV SOLN
INTRAVENOUS | Status: DC | PRN
  Administered 2011-06-06: 40 mg via INTRAVENOUS

## 2011-06-06 MED ORDER — CEFAZOLIN SODIUM 1-5 GM-% IV SOLN
1.0000 g | Freq: Once | INTRAVENOUS | Status: DC
  Administered 2011-06-06: 2 g via INTRAVENOUS

## 2011-06-06 MED ORDER — HYDROMORPHONE HCL PF 1 MG/ML IJ SOLN
0.2500 mg | INTRAMUSCULAR | Status: DC | PRN
  Administered 2011-06-06: 0.5 mg via INTRAVENOUS

## 2011-06-06 MED ORDER — LIDOCAINE HCL 2 % IJ SOLN
INTRAMUSCULAR | Status: DC | PRN
  Administered 2011-06-06: 6 mL

## 2011-06-06 MED ORDER — CHLORHEXIDINE GLUCONATE 4 % EX LIQD: 60.0000 mL | Freq: Once | CUTANEOUS | Status: DC

## 2011-06-06 MED ORDER — PROMETHAZINE HCL 25 MG/ML IJ SOLN: 6.2500 mg | INTRAMUSCULAR | Status: DC | PRN

## 2011-06-06 SURGICAL SUPPLY — 50 items
BANDAGE ACE 4 STERILE (GAUZE/BANDAGES/DRESSINGS) ×1 IMPLANT
BANDAGE ADHESIVE 1X3 (GAUZE/BANDAGES/DRESSINGS) IMPLANT
BANDAGE ELASTIC 3 VELCRO ST LF (GAUZE/BANDAGES/DRESSINGS) ×3 IMPLANT
BANDAGE ELASTIC 4 VELCRO ST LF (GAUZE/BANDAGES/DRESSINGS) ×1 IMPLANT
BLADE MINI RND TIP GREEN BEAV (BLADE) IMPLANT
BLADE SURG 15 STRL LF DISP TIS (BLADE) ×2 IMPLANT
BLADE SURG 15 STRL SS (BLADE) ×3
BNDG CMPR 9X4 STRL LF SNTH (GAUZE/BANDAGES/DRESSINGS)
BNDG ESMARK 4X9 LF (GAUZE/BANDAGES/DRESSINGS) IMPLANT
BRUSH SCRUB EZ PLAIN DRY (MISCELLANEOUS) ×3 IMPLANT
CLOSURE STERI STRIP 1/2 X4 (GAUZE/BANDAGES/DRESSINGS) ×1 IMPLANT
CLOTH BEACON ORANGE TIMEOUT ST (SAFETY) ×3 IMPLANT
CORDS BIPOLAR (ELECTRODE) ×3 IMPLANT
COVER MAYO STAND STRL (DRAPES) ×3 IMPLANT
COVER TABLE BACK 60X90 (DRAPES) ×3 IMPLANT
CUFF TOURNIQUET SINGLE 18IN (TOURNIQUET CUFF) IMPLANT
CUFF TOURNIQUET SINGLE 24IN (TOURNIQUET CUFF) ×1 IMPLANT
DECANTER SPIKE VIAL GLASS SM (MISCELLANEOUS) ×1 IMPLANT
DRAPE EXTREMITY T 121X128X90 (DRAPE) ×3 IMPLANT
DRAPE SURG 17X23 STRL (DRAPES) ×3 IMPLANT
DRSG TEGADERM 4X4.75 (GAUZE/BANDAGES/DRESSINGS) ×4 IMPLANT
GAUZE SPONGE 4X4 12PLY STRL LF (GAUZE/BANDAGES/DRESSINGS) ×3 IMPLANT
GLOVE BIO SURGEON STRL SZ 6.5 (GLOVE) ×1 IMPLANT
GLOVE BIOGEL M STRL SZ7.5 (GLOVE) ×3 IMPLANT
GLOVE ORTHO TXT STRL SZ7.5 (GLOVE) ×3 IMPLANT
GOWN BRE IMP PREV XXLGXLNG (GOWN DISPOSABLE) ×1 IMPLANT
GOWN PREVENTION PLUS XXLARGE (GOWN DISPOSABLE) ×6 IMPLANT
LOOP VESSEL MAXI BLUE (MISCELLANEOUS) IMPLANT
NEEDLE 27GAX1X1/2 (NEEDLE) IMPLANT
PACK BASIN DAY SURGERY FS (CUSTOM PROCEDURE TRAY) ×3 IMPLANT
PAD CAST 3X4 CTTN HI CHSV (CAST SUPPLIES) ×2 IMPLANT
PADDING CAST ABS 4INX4YD NS (CAST SUPPLIES) ×1
PADDING CAST ABS COTTON 4X4 ST (CAST SUPPLIES) ×2 IMPLANT
PADDING CAST COTTON 3X4 STRL (CAST SUPPLIES) ×3
SLEEVE SCD COMPRESS KNEE MED (MISCELLANEOUS) IMPLANT
SPLINT PLASTER EXTRA FAST 3X15 (CAST SUPPLIES) ×5
SPLINT PLASTER GYPS XFAST 3X15 (CAST SUPPLIES) IMPLANT
SPONGE GAUZE 4X4 12PLY (GAUZE/BANDAGES/DRESSINGS) ×3 IMPLANT
STOCKINETTE 4X48 STRL (DRAPES) ×3 IMPLANT
SUT PROLENE 3 0 PS 2 (SUTURE) ×3 IMPLANT
SUT VIC AB 3-0 X1 27 (SUTURE) IMPLANT
SUT VIC AB 4-0 P-3 18XBRD (SUTURE) IMPLANT
SUT VIC AB 4-0 P3 18 (SUTURE)
SYR 3ML 23GX1 SAFETY (SYRINGE) IMPLANT
SYR BULB 3OZ (MISCELLANEOUS) IMPLANT
SYR CONTROL 10ML LL (SYRINGE) IMPLANT
TOWEL OR 17X24 6PK STRL BLUE (TOWEL DISPOSABLE) ×6 IMPLANT
TRAY DSU PREP LF (CUSTOM PROCEDURE TRAY) ×3 IMPLANT
UNDERPAD 30X30 INCONTINENT (UNDERPADS AND DIAPERS) ×3 IMPLANT
WATER STERILE IRR 1000ML POUR (IV SOLUTION) ×3 IMPLANT

## 2011-06-06 NOTE — Progress Notes (Signed)
At 0915- pt. noted to have HR in the 30's x1 and decreased BP to 80's/40's (pt. Given 0.5 mg Dilaudid at 0905). Pt. HOB lowered flat and IVF rate increased-pt. BP increased at present to 100's/50's with HR increased to 40's. Will continue to monitor pt. closely and notify [facility] of any further decrease of BP or HR.

## 2011-06-06 NOTE — Anesthesia Procedure Notes (Addendum)
Performed by: Radford Pax   Procedure Name: LMA Insertion Date/Time: 06/06/2011 7:51 AM Performed by: Radford Pax Pre-anesthesia Checklist: Patient identified, Emergency Drugs available, Suction available, Patient being monitored and Timeout performed Patient Re-evaluated:Patient Re-evaluated prior to inductionOxygen Delivery Method: Circle System Utilized Preoxygenation: Pre-oxygenation with 100% oxygen Intubation Type: IV induction Ventilation: Mask ventilation without difficulty LMA: LMA with gastric port inserted LMA Size: 4.0 Number of attempts: 1 (autraumatic) Placement Confirmation: positive ETCO2 Tube secured with: Tape Dental Injury: Teeth and Oropharynx as per pre-operative assessment

## 2011-06-06 NOTE — Brief Op Note (Signed)
06/06/2011  8:20 AM  PATIENT:  [name]  60 y.o. male  PRE-OPERATIVE DIAGNOSIS:  carpal tunnel syndrome, ulnar nerve neuropathy  POST-OPERATIVE DIAGNOSIS:  carpal tunnel syndrome, ulnar nerve neuropathy  PROCEDURE:  Procedure(s): CARPAL TUNNEL RELEASE RIGHT ULNAR NERVE DECOMPRESSION AT CUBITAL TUNNEL RIGHT ELBOW  SURGEON:  Surgeon(s): Wyn Forster., MD  PHYSICIAN ASSISTANT:   ASSISTANTS: Miquel Lamson Dasnoit,P.A.-C   ANESTHESIA:   general  EBL:  Total I/O In: 100 [I.V.:100] Out: -   BLOOD ADMINISTERED:none  DRAINS: none   LOCAL MEDICATIONS USED:  LIDOCAINE 6 CC  SPECIMEN:  No Specimen  DISPOSITION OF SPECIMEN:  N/A  COUNTS:  YES  TOURNIQUET:  * Missing tourniquet times found for documented tourniquets in log:  6910 *  DICTATION: .Other Dictation: Dictation Number [id number]  PLAN OF CARE: Discharge to home after PACU  PATIENT DISPOSITION:  PACU - hemodynamically stable.

## 2011-06-06 NOTE — Anesthesia Preprocedure Evaluation (Signed)
Anesthesia Evaluation  Patient identified by MRN, date of birth, ID band Patient awake    Reviewed: Allergy & Precautions, H&P , NPO status , Patient's Chart, lab work & pertinent test results  Airway Mallampati: II TM Distance: >3 FB Neck ROM: Full    Dental   Pulmonary sleep apnea ,    Pulmonary exam normal       Cardiovascular hypertension, + CAD, + Past MI and + Peripheral Vascular Disease     Neuro/Psych    GI/Hepatic   Endo/Other  Morbid obesity  Renal/GU      Musculoskeletal   Abdominal (+) obese,   Peds  Hematology   Anesthesia Other Findings   Reproductive/Obstetrics                           Anesthesia Physical Anesthesia Plan  ASA: III  Anesthesia Plan: General   Post-op Pain Management:    Induction: Intravenous  Airway Management Planned: LMA  Additional Equipment:   Intra-op Plan:   Post-operative Plan: Extubation in OR  Informed Consent: I have reviewed the patients History and Physical, chart, labs and discussed the procedure including the risks, benefits and alternatives for the proposed anesthesia with the patient or authorized representative who has indicated his/her understanding and acceptance.     Plan Discussed with: CRNA and Surgeon  Anesthesia Plan Comments:         Anesthesia Quick Evaluation

## 2011-06-06 NOTE — Progress Notes (Signed)
Dr. Gypsy Balsam [facility] notified of decreased BP (100's/50's) and HR in the 40's- 500 ml bolus to be given as per Dr. Gypsy Balsam. Will continue to monitor pt. and notify [facility] of any further decreased BP/ decreased HR after bolus given.

## 2011-06-06 NOTE — Op Note (Signed)
NAME:  [name], [name]                   ACCOUNT NO.:  MEDICAL RECORD NO.:  [id number]  LOCATION:                                 FACILITY:  PHYSICIAN:  Katy Fitch. Yordi Krager, M.D.      DATE OF BIRTH:  DATE OF PROCEDURE:  06/06/2011 DATE OF DISCHARGE:                              OPERATIVE REPORT   PREOPERATIVE DIAGNOSIS:  Chronic right carpal tunnel syndrome, and chronic right ulnar neuropathy at cubital tunnel, right elbow.  POSTOPERATIVE DIAGNOSIS:  Chronic right carpal tunnel syndrome, and chronic right ulnar neuropathy at cubital tunnel, right elbow.  OPERATION: 1. Decompression of right ulnar nerve at cubital tunnel. 2. Decompression of right ulnar nerve at carpal tunnel.  OPERATING SURGEON:  Katy Fitch. Jerilee Space, MD  ASSISTANT:  Marveen Reeks Dasnoit, PA-C  ANESTHESIA:  General by LMA.  SUPERVISING ANESTHESIOLOGIST:  Bedelia Person, MD  INDICATIONS:  [name] is a 60 year old gentleman referred for evaluation and management of hand numbness.  He is status post previous left carpal tunnel release.  Clinical examination revealed signs of median entrapment neuropathy at the right wrist and ulnar entrapment neuropathy at the right elbow.  Due to a failure to respond to nonoperative measures, and electrodiagnostic studies confirming pathology in both locations, he is brought to the operating room at this time anticipating release of his right transverse carpal ligament of the wrist and decompression of the right ulnar nerve at cubital tunnel with possible anterior subcutaneous transposition.  Preoperatively, [name] was reminded the potential risks and benefits of surgery.  He is noted to have no drug allergies.  His multiple medical problems were reviewed.  PROCEDURE:  [name] was brought to room 6 of the [facility] and placed in supine position upon the operating table.  Following an Anesthesia consult with Dr. Gypsy Balsam, general anesthesia by LMA  technique was recommended and accepted.  Under Dr. Burnett Corrente direct supervision in room 6, general anesthesia by LMA technique was induced, followed by routine Betadine scrub and paint of the right upper extremity.  Per protocol, 2 g of Ancef was administered as an IV prophylactic antibiotic.  Following a routine surgical time-out, the right arm was exsanguinated with an Esmarch bandage, the arterial tourniquet on proximal brachium inflated to 220 mmHg.  The procedure commenced with incision of the mid palm in the line of the ring finger.  Subcutaneous tissues were carefully divided revealing the palmar fascia.  This was split longitudinally to reveal an intact distal margin of the transverse carpal ligament.  The carpal tunnel was sounded with a Penfield 4 elevator, clearing the median nerve and common sensory branches from the transverse carpal ligament.  Transverse carpal ligament was released with scissors extending into the distal forearm.  This widely opened the carpal canal.  There was scarring from previous wrist surgery at the level of the distal wrist flexion crease.  This was carefully dissected with the ulnar bursa being dissected away from the scar.  The volar forearm fascia was released 5 cm proximal to the distal wrist flexion crease.  Bleeding points were electrocauterized with bipolar current, followed by repair of the skin with intradermal 3-0  Prolene suture.  Attention was then directed to the elbow.  A 3-cm incision was fashioned posterior to the epicondyle.  Subcutaneous tissues were carefully divided.  The subcutaneous tissues were  remarkable for 2 cm of subcutaneous fat.  With great care, the posterior branches of the medial antebrachial cutaneous nerve were identified and spared.  Several transverse veins were isolated and electrocauterized.  The ulnar nerve was identified posterior to the epicondyle, followed by release of the arcuate ligament, Osborne band,  and the fascia at the head of the flexor carpi ulnaris, and the proximal radial fascia.  With elbow range of motion 0-110 degrees, there was no subluxation of the nerve noted.  Bleeding points were electrocauterized with bipolar current, followed by repair of the skin with subcutaneous suture of 4-0 Vicryl and intradermal 3-0 Prolene with Steri-Strips.  [name] was placed in compressive dressings at the elbow and wrist.  At the elbow, he had sterile gauze and Tegaderm applied followed by Ace wrap.  At the wrist, he had sterile gauze, sterile Webril, and a volar plaster splint maintaining the wrist in 10 degrees of dorsiflexion.  There were no apparent complications.  For aftercare, he is provided a prescription for Percocet 5/325 1-2 tablets q.4 h. p.r.n. pain, 20 tablets without refill.  We will see him back for follow up in our office in 8 days or sooner p.r.n. problems.     Katy Fitch [name], M.D.     RVS/MEDQ  D:  06/06/2011  T:  06/06/2011  Job:  [id number]  cc:   Dr. Dimas Aguas

## 2011-06-06 NOTE — Op Note (Signed)
Op noted dictated :  [id number] Csn:  [id number]

## 2011-06-06 NOTE — Anesthesia Postprocedure Evaluation (Signed)
  Anesthesia Post-op Note  Patient: [name]  Procedure(s) Performed:  CARPAL TUNNEL RELEASE; ULNAR NERVE DECOMPRESSION/TRANSPOSITION - decompression ulnar nerve right cubital tunnel   Patient Location: PACU  Anesthesia Type: General  Level of Consciousness: awake, alert  and oriented  Airway and Oxygen Therapy: Patient Spontanous Breathing  Post-op Pain: mild  Post-op Assessment: Post-op Vital signs reviewed, Patient's Cardiovascular Status Stable, Respiratory Function Stable, Patent Airway, No signs of Nausea or vomiting and Pain level controlled  Post-op Vital Signs: stable  Complications: No apparent anesthesia complications

## 2011-06-06 NOTE — Progress Notes (Signed)
06/06/2011 09:55am Dr. Gypsy Balsam at bedside and saw BP's 92/38 and 107/60 - said he would like BP 100 and watch him a few more minutes in PACU and then he can go home.

## 2011-06-06 NOTE — Transfer of Care (Signed)
Immediate Anesthesia Transfer of Care Note  Patient: [name]  Procedure(s) Performed:  CARPAL TUNNEL RELEASE; ULNAR NERVE DECOMPRESSION/TRANSPOSITION - decompression ulnar nerve right cubital tunnel   Patient Location: PACU  Anesthesia Type: General  Level of Consciousness: awake, alert  and patient cooperative  Airway & Oxygen Therapy: Patient Spontanous Breathing and Patient connected to face mask oxygen  Post-op Assessment: Report given to PACU RN and Post -op Vital signs reviewed and stable  Post vital signs: Reviewed and stable  Complications: No apparent anesthesia complications

## 2011-06-09 ENCOUNTER — Encounter (HOSPITAL_BASED_OUTPATIENT_CLINIC_OR_DEPARTMENT_OTHER): Payer: Self-pay | Admitting: Orthopedic Surgery

## 2012-04-11 ENCOUNTER — Ambulatory Visit (INDEPENDENT_AMBULATORY_CARE_PROVIDER_SITE_OTHER): Payer: BC Managed Care – PPO | Admitting: Cardiology

## 2012-04-11 ENCOUNTER — Encounter: Payer: Self-pay | Admitting: Cardiology

## 2012-04-11 VITALS — BP 145/85 | HR 67 | Ht 68.0 in | Wt 285.1 lb

## 2012-04-11 DIAGNOSIS — I2510 Atherosclerotic heart disease of native coronary artery without angina pectoris: Secondary | ICD-10-CM

## 2012-04-11 DIAGNOSIS — I77810 Thoracic aortic ectasia: Secondary | ICD-10-CM

## 2012-04-11 DIAGNOSIS — E785 Hyperlipidemia, unspecified: Secondary | ICD-10-CM

## 2012-04-11 DIAGNOSIS — I10 Essential (primary) hypertension: Secondary | ICD-10-CM

## 2012-04-11 NOTE — Progress Notes (Signed)
Clinical Summary [name] is a 61 y.o.male referred to the office by Dr. Dimas Aguas. He is a former patient of Dr. Andee Lineman, last seen in 2010. Available records were reviewed. He reports interval followup with Dr. Dimas Aguas. We discussed his medications today. Followup ECG shows sinus rhythm with decreased R-wave progression, suggests prior anterior infarct consistent with his history.  Symptomatically speaking, he reports occasional angina symptoms when he exercises on the treadmill at home, 3 or 4 days a week. Describes this as mild in intensity, typically early in exercise and he is able to continue with resolution in symptoms.  He reports no bleeding problems on DAPT. Has has had lipids assessed by Dr. Dimas Aguas on statin therapy.  He states that he is interested in the lap band procedure, has been undergoing the initial meetings related to this.   Allergies  Allergen Reactions  . Chlorhexidine Itching    Pt. States on last admission CHG wipes made him itch and it had to be washed off.  Uncertain if benadryl was given.    Current Outpatient Prescriptions  Medication Sig Dispense Refill  . aspirin 325 MG tablet Take 325 mg by mouth daily.        Marland Kitchen atorvastatin (LIPITOR) 20 MG tablet Take 20 mg by mouth every evening.      . clopidogrel (PLAVIX) 75 MG tablet Take 75 mg by mouth daily.        . fish oil-omega-3 fatty acids 1000 MG capsule Takes maybe one per week      . l-methylfolate-B6-B12 (METANX) 3-35-2 MG TABS Take 1 tablet by mouth daily.      Marland Kitchen losartan-hydrochlorothiazide (HYZAAR) 100-25 MG per tablet Take 1 tablet by mouth daily.        . metoprolol (LOPRESSOR) 50 MG tablet Take 25 mg by mouth 2 (two) times daily.      . naproxen (NAPROSYN) 500 MG tablet Take 500 mg by mouth 2 (two) times daily with a meal.        . nitroGLYCERIN (NITROSTAT) 0.4 MG SL tablet Place 0.4 mg under the tongue every 5 (five) minutes as needed.      . tadalafil (CIALIS) 20 MG tablet Take 20 mg by mouth  daily as needed.          Past Medical History  Diagnosis Date  . Sleep apnea   . Coronary atherosclerosis of native coronary artery     Multiple stents - RCA/LAD/diagonal, overlapping DES LAD, stent thrombosis 2005  . Essential hypertension, benign   . Peripheral vascular disease   . Myocardial infarction     Anterior with LAD stent thrombosis 2005  . Arthritis   . Hyperlipemia   . History of GI bleed      On nonsteroidals  . Aortic root dilatation     41 mm by CT 2008, 36 mm by echo 2011    Past Surgical History  Procedure Date  . Appendectomy   . Cholecystectomy   . Joint replacement     rt total knee-lt total knee  . Total knee arthroplasty 2006    rt  . Total knee arthroplasty 2007    lt  . Elbow arthroscopy     rt-bursa removed  . Shoulder arthroscopy 4/11    rt  . Cervical fusion 9/10  . Coronary angioplasty with stent placement [id number]  . Carpal tunnel release 6/12    lt  . Carpal tunnel release 06/06/2011    Procedure: CARPAL TUNNEL RELEASE;  Surgeon:  Wyn Forster., MD;  Location: [facility];  Service: Orthopedics;  Laterality: Right;  . Ulnar nerve transposition 06/06/2011    Procedure: ULNAR NERVE DECOMPRESSION/TRANSPOSITION;  Surgeon: Wyn Forster., MD;  Location: [facility];  Service: Orthopedics;  Laterality: Right;  decompression ulnar nerve right cubital tunnel     Family History  Problem Relation Age of Onset  . Coronary artery disease    . Cancer      Social History Mr. Teschner reports that he quit smoking about 8 years ago. His smoking use included Cigarettes. He does not have any smokeless tobacco history on file. Mr. Neisler reports that he drinks alcohol.  Review of Systems No palpitations, dizziness, syncope. No orthopnea or PND. Reports stable appetite. No melena or hematochezia. No peripheral edema or claudication. Otherwise negative.  Physical Examination Filed Vitals:   04/11/12 0849  BP:  145/85  Pulse: 67   Filed Weights   04/11/12 0849  Weight: 285 lb 1.9 oz (129.33 kg)   Patient in no acute distress. HEENT: Conjunctiva and lids normal, oropharynx clear. Neck: Supple, increased girth, no elevated JVP or carotid bruits, no thyromegaly. Lungs: Clear to auscultation, nonlabored breathing at rest. Cardiac: Regular rate and rhythm, no S3 or significant systolic murmur, no pericardial rub. Abdomen: Soft, protuberant, bowel sounds present, no guarding or rebound. Extremities: No pitting edema, distal pulses 2+. Skin: Warm and dry. Musculoskeletal: No kyphosis. Neuropsychiatric: Alert and oriented x3, affect grossly appropriate.    Problem List and Plan   CAD, NATIVE VESSEL Symptomatically stable with occasional angina, mild at times when he exercises on the treadmill. This has not been progressive. His medical therapy looks reasonable. ECG is reviewed and stable as well. We will plan to follow him on medical therapy at this point, although certainly if his plan is to pursue a lap band procedure, he will need a followup stress test prior to this. Followup arranged.  HYPERLIPIDEMIA-MIXED Patient continues on statin therapy with omega-3 supplements, followed by Dr. Dimas Aguas. Can hopefully review followup lab work, pending in the next few weeks.  Essential hypertension, benign Blood pressure is mildly elevated today. Continue exercise, sodium restriction. Keep followup with Dr. Dimas Aguas.  Aortic root dilatation Variable results noted over time based on different modalities. A followup echocardiogram will be obtained, also useful in reassessment of LV function.    Jonelle Sidle, M.D., F.A.C.C.

## 2012-04-11 NOTE — Assessment & Plan Note (Signed)
Variable results noted over time based on different modalities. A followup echocardiogram will be obtained, also useful in reassessment of LV function.

## 2012-04-11 NOTE — Assessment & Plan Note (Signed)
Patient continues on statin therapy with omega-3 supplements, followed by Dr. Dimas Aguas. Can hopefully review followup lab work, pending in the next few weeks.

## 2012-04-11 NOTE — Assessment & Plan Note (Signed)
Blood pressure is mildly elevated today. Continue exercise, sodium restriction. Keep followup with Dr. Dimas Aguas.

## 2012-04-11 NOTE — Patient Instructions (Addendum)

## 2012-04-11 NOTE — Assessment & Plan Note (Signed)
Symptomatically stable with occasional angina, mild at times when he exercises on the treadmill. This has not been progressive. His medical therapy looks reasonable. ECG is reviewed and stable as well. We will plan to follow him on medical therapy at this point, although certainly if his plan is to pursue a lap band procedure, he will need a followup stress test prior to this. Followup arranged.

## 2012-04-17 ENCOUNTER — Other Ambulatory Visit (INDEPENDENT_AMBULATORY_CARE_PROVIDER_SITE_OTHER): Payer: BC Managed Care – PPO

## 2012-04-17 ENCOUNTER — Other Ambulatory Visit: Payer: Self-pay

## 2012-04-17 DIAGNOSIS — I2510 Atherosclerotic heart disease of native coronary artery without angina pectoris: Secondary | ICD-10-CM

## 2012-04-23 ENCOUNTER — Telehealth: Payer: Self-pay | Admitting: *Deleted

## 2012-04-23 NOTE — Telephone Encounter (Signed)
Patient informed. 

## 2012-04-23 NOTE — Telephone Encounter (Signed)
Message copied by Eustace Moore on Tue Apr 23, 2012  9:27 AM ------      Message from: MCDOWELL, Illene Bolus      Created: Wed Apr 17, 2012  3:44 PM       Reviewed. LV systolic function normal overall as before. Mild ascending aortic dilatation is also stable. Keep regular followup.

## 2012-04-30 ENCOUNTER — Ambulatory Visit: Payer: BC Managed Care – PPO | Admitting: Cardiology

## 2012-05-24 ENCOUNTER — Ambulatory Visit (INDEPENDENT_AMBULATORY_CARE_PROVIDER_SITE_OTHER): Payer: BC Managed Care – PPO | Admitting: Surgery

## 2012-05-24 ENCOUNTER — Encounter (INDEPENDENT_AMBULATORY_CARE_PROVIDER_SITE_OTHER): Payer: Self-pay | Admitting: Surgery

## 2012-05-24 DIAGNOSIS — I10 Essential (primary) hypertension: Secondary | ICD-10-CM

## 2012-05-24 DIAGNOSIS — Z6841 Body Mass Index (BMI) 40.0 and over, adult: Secondary | ICD-10-CM

## 2012-05-24 NOTE — Progress Notes (Signed)
Re:   [name] [facility] DOB:   May 13, 1951 MRN:   [id number]  ASSESSMENT AND PLAN: 1.  Morbid obesity, weight - 283, BMI - 42.5  Per the 1991 NIH Consensus Statement, the patient is a candidate for bariatric surgery.  The patient attended our information session and reviewed the different types of bariatric surgery.    The patient is interested in the laparoscopic adjustable gastric band.  I discussed with the patient the indications and risks of lap band surgery.  The potential risks of surgery include, but are not limited to, bleeding, infection, DVT and PE, slippage and erosion of the band, open surgery, and death.  The patient understands the importance of compliance and long term follow-up with our group after surgery.  He was given literature regarding lap band surgery.  His wife is here with him today.  From here we'll obtain labs, x-rays, nutrition consult, and psych consult.  2.  Sleep apnea  Since 2007 3.  CAD  History of multiple stents x 4 in 2004 and 2005  He did see Dr. Donnella Bi, now sees Dr. Ival Bible.  Will need cardiac clearance. 4.  Hypertension 5.  Arthritis 6.  Hyperlipidemia  Though most recent labs normal - Cholesterol 165. 7.  History of upper GI bleed secondary to Bextra.  No problems since stopping meds 8.  Family history of colon cancer  Last colonoscopy by Dr. Gabriel Cirri in Sept 2013. 9. Multiple ortho procedures  Right shoulder, back, both knees.   Chief Complaint  Patient presents with  . Bariatric Pre-op    initial visit   REFERRING PHYSICIAN: Selinda Flavin, MD  HISTORY OF PRESENT ILLNESS: [name] is a 61 y.o. (DOB: 02/24/1951)  whtie male whose primary care physician is Selinda Flavin, MD and comes to me today for surgery for weight loss.  Knows Jaquita Rector, who has a lap band.  He went to the information session that I talked about bariatric surgery.  He has tried multiple diets, including: Weight Watchers x4, Atkins diet  x15, Nutrisystem, and other low-calorie diets. Most of his diets work well for 4 to 6 weeks, he loses a few pounds, then he is back to eating and gaining weight.   In the 1980s, he tried Dexatrim. He tried no other prescription weight-loss medicine.  He did just do a six-month program sponsored by [facility] with the pedometer, exercise recommendations, and diet.  He had a GI bleed after taking Bextra a number of years ago.  He still takes Naproxen and we talked about NSAIDs and bariatric surgery.  No history of liver disease.  No history of gall bladder disease.  No history of pancreas disease. He had an appendectomy in the 1970's.  No history of colon disease.  He had a colonoscopy by Dr. Gabriel Cirri in Sept 2013.  His father and grandmother had colon cancer.    Past Medical History  Diagnosis Date  . Sleep apnea   . Coronary atherosclerosis of native coronary artery     Multiple stents - RCA/LAD/diagonal, overlapping DES LAD, stent thrombosis 2005  . Essential hypertension, benign   . Peripheral vascular disease   . Myocardial infarction     Anterior with LAD stent thrombosis 2005  . Arthritis   . Hyperlipemia   . History of GI bleed      On nonsteroidals  . Aortic root dilatation     41 mm by CT 2008, 36 mm by echo 2011  Past Surgical History  Procedure Date  . Appendectomy   . Cholecystectomy   . Joint replacement     rt total knee-lt total knee  . Total knee arthroplasty 2006    rt  . Total knee arthroplasty 2007    lt  . Elbow arthroscopy     rt-bursa removed  . Shoulder arthroscopy 4/11    rt  . Cervical fusion 9/10  . Coronary angioplasty with stent placement [id number]  . Carpal tunnel release 6/12    lt  . Carpal tunnel release 06/06/2011    Procedure: CARPAL TUNNEL RELEASE;  Surgeon: Wyn Forster., MD;  Location: [facility];  Service: Orthopedics;  Laterality: Right;  . Ulnar nerve transposition 06/06/2011    Procedure: ULNAR NERVE  DECOMPRESSION/TRANSPOSITION;  Surgeon: Wyn Forster., MD;  Location: [facility];  Service: Orthopedics;  Laterality: Right;  decompression ulnar nerve right cubital tunnel       Current Outpatient Prescriptions  Medication Sig Dispense Refill  . aspirin 325 MG tablet Take 325 mg by mouth daily.        Marland Kitchen atorvastatin (LIPITOR) 20 MG tablet Take 20 mg by mouth every evening.      Randa Ngo 30 MG/ACT SOLN       . clopidogrel (PLAVIX) 75 MG tablet Take 75 mg by mouth daily.        . fish oil-omega-3 fatty acids 1000 MG capsule Takes maybe one per week      . l-methylfolate-B6-B12 (METANX) 3-35-2 MG TABS Take 1 tablet by mouth daily.      Marland Kitchen losartan-hydrochlorothiazide (HYZAAR) 100-25 MG per tablet Take 1 tablet by mouth daily.        . metoprolol (LOPRESSOR) 50 MG tablet Take 25 mg by mouth 2 (two) times daily.      . naproxen (NAPROSYN) 500 MG tablet Take 500 mg by mouth 2 (two) times daily with a meal.        . nitroGLYCERIN (NITROSTAT) 0.4 MG SL tablet Place 0.4 mg under the tongue every 5 (five) minutes as needed.      . tadalafil (CIALIS) 20 MG tablet Take 20 mg by mouth daily as needed.            Allergies  Allergen Reactions  . Chlorhexidine Itching    Pt. States on last admission CHG wipes made him itch and it had to be washed off.  Uncertain if benadryl was given.    REVIEW OF SYSTEMS: Skin:  No history of rash.  No history of abnormal moles. Infection:  No history of hepatitis or HIV.  No history of MRSA. Neurologic:  No history of stroke.  No history of seizure.  No history of headaches. Cardiac: Hypertension.  Had coronary stents places in 2004 and 2005 in response to a heart attack.  He has had 4 stents placed.  His cardiologist was Dr. Donnella Bi, he is now seeing Dr. Ival Bible. Pulmonary:  Sleep apnea since 2007.  Endocrine:  No diabetes. No thyroid disease. Gastrointestinal:  See HPI. Urologic:  No history of kidney stones.  No history of bladder  infections. Musculoskeletal:  Low back surgery in 1973, Dr. Vear Clock.  Knee replacement in 2006 and 2007 by Dr. Jim Desanctis.  Right rotator cuff surgery 2010. Right carpel tunnel by Dr. Teressa Senter, Dec. 2012. Hematologic:  No bleeding disorder.  No history of anemia.  Not anticoagulated. Psycho-social:  The patient is oriented.   The patient has  no obvious psychologic or social impairment to understanding our conversation and plan.  SOCIAL and FAMILY HISTORY: Married.  His wife is with him. Works as Product/process development scientist. Has 3 sons - 39, 43, and 22.  PHYSICAL EXAM: BP 170/92  Pulse 72  Temp 97.6 F (36.4 C) (Temporal)  Resp 16  Ht 5' 8.5" (1.74 m)  Wt 283 lb 9.6 oz (128.64 kg)  BMI 42.49 kg/m2  General: WN obese WM who is alert and generally healthy appearing.  HEENT: Normal. Pupils equal. Neck: Supple. No mass.  No thyroid mass.  He has a "bull neck" Lymph Nodes:  No supraclavicular or cervical nodes. Lungs: Clear to auscultation and symmetric breath sounds. Heart:  RRR. No murmur or rub. Abdomen: Soft. No mass. No tenderness. No hernia. Normal bowel sounds. Scars from appendectomy and lap chole.  He is more apple than pear in shape. Rectal: Not done.  Recent colonoscopy. Extremities:  Good strength and ROM  in upper and lower extremities. Neurologic:  Grossly intact to motor and sensory function. Psychiatric: Has normal mood and affect. Behavior is normal.   DATA REVIEWED: Notes he has obtained.  Ovidio Kin, MD,  [facility] [address] [place]  [address]   [place], [place]    [address] Phone:  [phone] FAX:  [phone]

## 2012-05-27 ENCOUNTER — Other Ambulatory Visit: Payer: Self-pay | Admitting: Cardiology

## 2012-05-27 ENCOUNTER — Telehealth: Payer: Self-pay | Admitting: Cardiology

## 2012-05-27 ENCOUNTER — Other Ambulatory Visit: Payer: Self-pay | Admitting: *Deleted

## 2012-05-27 ENCOUNTER — Encounter: Payer: Self-pay | Admitting: *Deleted

## 2012-05-27 DIAGNOSIS — Z01810 Encounter for preprocedural cardiovascular examination: Secondary | ICD-10-CM

## 2012-05-27 DIAGNOSIS — I2510 Atherosclerotic heart disease of native coronary artery without angina pectoris: Secondary | ICD-10-CM

## 2012-05-27 NOTE — Telephone Encounter (Signed)
Cardiolite stress test -weight 285  Diagnosis: 414.01, 414.00, 401.1  Monday, June 03, 2012 @ [facility]

## 2012-05-27 NOTE — Telephone Encounter (Signed)
Auth # [id number] exp 06/25/12

## 2012-05-27 NOTE — Progress Notes (Signed)
Per Diona Browner and cardiac clearance request from [facility] patient need exercise cardiolite. Please see form for details. Patient informed. Instruction letter mailed to patient.

## 2012-06-03 DIAGNOSIS — Z01810 Encounter for preprocedural cardiovascular examination: Secondary | ICD-10-CM

## 2012-06-04 ENCOUNTER — Ambulatory Visit (HOSPITAL_COMMUNITY)
Admission: RE | Admit: 2012-06-04 | Discharge: 2012-06-04 | Disposition: A | Discharge status: Home / Self Care | Payer: BC Managed Care – PPO | Source: Ambulatory Visit | Attending: Surgery | Admitting: Surgery

## 2012-06-04 ENCOUNTER — Telehealth: Payer: Self-pay | Admitting: *Deleted

## 2012-06-04 DIAGNOSIS — G4730 Sleep apnea, unspecified: Secondary | ICD-10-CM | POA: Insufficient documentation

## 2012-06-04 DIAGNOSIS — Z6841 Body Mass Index (BMI) 40.0 and over, adult: Secondary | ICD-10-CM | POA: Insufficient documentation

## 2012-06-04 DIAGNOSIS — E785 Hyperlipidemia, unspecified: Secondary | ICD-10-CM | POA: Insufficient documentation

## 2012-06-04 DIAGNOSIS — I2510 Atherosclerotic heart disease of native coronary artery without angina pectoris: Secondary | ICD-10-CM | POA: Insufficient documentation

## 2012-06-04 DIAGNOSIS — M129 Arthropathy, unspecified: Secondary | ICD-10-CM | POA: Insufficient documentation

## 2012-06-04 DIAGNOSIS — I10 Essential (primary) hypertension: Secondary | ICD-10-CM | POA: Insufficient documentation

## 2012-06-04 DIAGNOSIS — K449 Diaphragmatic hernia without obstruction or gangrene: Secondary | ICD-10-CM | POA: Insufficient documentation

## 2012-06-04 NOTE — Telephone Encounter (Signed)
Message copied by Eustace Moore on Tue Jun 04, 2012  9:33 AM ------      Message from: Jonelle Sidle      Created: Tue Jun 04, 2012  8:59 AM       Reviewed. Overall low risk study with no large ischemic territories, LVEF 55%. This was done as part of preoperative evaluation, possible lap band procedure being considered. With symptomatically stable CAD and low risk ischemic testing, anticipate that he should be able to proceed with an acceptable perioperative cardiac risk on medical therapy. Please forward this note and a copy of stress test to [facility].

## 2012-06-04 NOTE — Telephone Encounter (Signed)
Patient informed. Copy of note and stress test faxed to [facility].

## 2012-06-04 NOTE — Telephone Encounter (Signed)
Left message for patient to call office.  

## 2012-06-06 ENCOUNTER — Ambulatory Visit: Payer: BC Managed Care – PPO | Admitting: *Deleted

## 2012-06-07 ENCOUNTER — Other Ambulatory Visit (HOSPITAL_COMMUNITY): Payer: BC Managed Care – PPO

## 2012-06-11 ENCOUNTER — Encounter: Payer: BC Managed Care – PPO | Attending: Surgery | Admitting: *Deleted

## 2012-06-11 ENCOUNTER — Encounter (HOSPITAL_COMMUNITY)
Admission: RE | Disposition: A | Discharge status: Home / Self Care | Payer: Self-pay | Source: Ambulatory Visit | Attending: Surgery

## 2012-06-11 ENCOUNTER — Encounter: Payer: Self-pay | Admitting: *Deleted

## 2012-06-11 ENCOUNTER — Ambulatory Visit (HOSPITAL_COMMUNITY)
Admission: RE | Admit: 2012-06-11 | Discharge: 2012-06-11 | Disposition: A | Discharge status: Home / Self Care | Payer: BC Managed Care – PPO | Source: Ambulatory Visit | Attending: Surgery | Admitting: Surgery

## 2012-06-11 DIAGNOSIS — E669 Obesity, unspecified: Secondary | ICD-10-CM | POA: Insufficient documentation

## 2012-06-11 DIAGNOSIS — Z713 Dietary counseling and surveillance: Secondary | ICD-10-CM | POA: Insufficient documentation

## 2012-06-11 DIAGNOSIS — Z01818 Encounter for other preprocedural examination: Secondary | ICD-10-CM | POA: Insufficient documentation

## 2012-06-11 HISTORY — PX: BREATH TEK H PYLORI: SHX5422

## 2012-06-11 SURGERY — BREATH TEST, FOR HELICOBACTER PYLORI

## 2012-06-11 NOTE — Patient Instructions (Addendum)
   Follow Pre-Op Nutrition Goals to prepare for Lapband Surgery.   Call the [facility] at [phone] once you have been given your surgery date to enrolled in the Pre-Op Nutrition Class. You will need to attend this nutrition class 3-4 weeks prior to your surgery. 

## 2012-06-11 NOTE — Progress Notes (Addendum)
  Pre-Op Assessment Visit:  Pre-Operative LAGB Surgery  Medical Nutrition Therapy:  Appt start time: 1100   End time:  1200.  Patient was seen on 06/11/2012 for Pre-Operative LAGB Nutrition Assessment. Assessment and letter of approval faxed to [facility] Bariatric Surgery Program coordinator on 06/11/2012.  Approval letter sent to [facility] and will be available in the chart under the media tab.  Handouts given during visit include:  Pre-Op Goals   Bariatric Surgery Protein Shakes  Samples given during visit include:   Premier Protein shake: 1 ea Lot # [id number]; Exp: 04/20/13  Patient to call for Pre-Op and Post-Op Nutrition Education at the [facility] when surgery is scheduled.

## 2012-06-12 ENCOUNTER — Encounter (HOSPITAL_COMMUNITY): Payer: Self-pay | Admitting: Surgery

## 2012-06-28 ENCOUNTER — Encounter (INDEPENDENT_AMBULATORY_CARE_PROVIDER_SITE_OTHER): Payer: Self-pay

## 2012-08-17 ENCOUNTER — Other Ambulatory Visit: Payer: Self-pay

## 2012-08-22 ENCOUNTER — Encounter: Payer: BC Managed Care – PPO | Attending: Surgery | Admitting: *Deleted

## 2012-08-22 DIAGNOSIS — Z713 Dietary counseling and surveillance: Secondary | ICD-10-CM | POA: Insufficient documentation

## 2012-08-22 DIAGNOSIS — Z01818 Encounter for other preprocedural examination: Secondary | ICD-10-CM | POA: Insufficient documentation

## 2012-08-24 NOTE — Progress Notes (Signed)
Bariatric Class:  Appt start time: 0930 end time:  1030.  Pre-Operative Nutrition Class  Patient was seen on 08/22/12 for Pre-Operative Bariatric Surgery Education at [facility].   Surgery date: 09/09/12 Surgery type: LAGB Start weight at [facility]: 291.8 lbs  (06/11/12)  Weight today: 291.7 lbs Weight change: n/a Total weight lost: n/a BMI: 43.7 kg/m^2  Samples given per MNT protocol: Bariatric Advantage Multivitamin Lot # [id number] Exp: 06/15  Bariatric Advantage Calcium Citrate Lot # [id number] Exp: 10/15  Bariatric Advantage Sublingual B12 Lot # [id number] Exp: 10/15  Celebrate Vitamins Complete Multivitamin Lot # [id number] Exp: 11/14  Celebrate Vitamins Calcium Citrate Lot # [id number] Exp: 08/15  [name] Protein Powder Lot # [id number] Exp: 06/15  Premier Protein Shake Lot # [id number] Exp: 07/15/13  The following the learning objective met by the patient during this course:  Identifies Pre-Op Dietary Goals and will begin 2 weeks pre-operatively  Identifies appropriate sources of fluids and proteins   States protein recommendations and appropriate sources pre and post-operatively  Identifies Post-Operative Dietary Goals and will follow for 2 weeks post-operatively  Identifies appropriate multivitamin and calcium sources  Describes the need for physical activity post-operatively and will follow MD recommendations  States when to call healthcare provider regarding medication questions or post-operative complications  Handouts given during class include:  Pre-Op Bariatric Surgery Diet Handout  Protein Shake Handout  Post-Op Bariatric Surgery Nutrition Handout  BELT Program Information Flyer  Support Group Information Flyer  [facility] Bariatric Supplements Price List  Follow-Up Plan: Patient will follow-up at [facility] 2 weeks post operatively for diet advancement per MD.

## 2012-08-26 ENCOUNTER — Encounter: Payer: Self-pay | Admitting: *Deleted

## 2012-08-26 NOTE — Patient Instructions (Signed)
Follow:   Pre-Op Diet per MD 2 weeks prior to surgery  Phase 2- Liquids (clear/full) 2 weeks after surgery  Vitamin/Mineral/Calcium guidelines for purchasing bariatric supplements  Exercise guidelines pre and post-op per MD  Follow-up at [facility] in 2 weeks post-op for diet advancement. Contact Lateefa Crosby as needed with questions/concerns. 

## 2012-08-28 ENCOUNTER — Ambulatory Visit (INDEPENDENT_AMBULATORY_CARE_PROVIDER_SITE_OTHER): Payer: BC Managed Care – PPO | Admitting: Surgery

## 2012-08-28 ENCOUNTER — Encounter (INDEPENDENT_AMBULATORY_CARE_PROVIDER_SITE_OTHER): Payer: Self-pay | Admitting: Surgery

## 2012-08-28 NOTE — Addendum Note (Signed)
Addended by: Kandis Cocking on: 08/28/2012 01:28 PM   Modules accepted: Orders

## 2012-08-28 NOTE — Progress Notes (Addendum)
Re:   [name] DOB:   04-07-1951 MRN:   [id number]  ASSESSMENT AND PLAN: 1.  Morbid obesity, weight - 283, BMI - 42.5  Per the 1991 NIH Consensus Statement, the patient is a candidate for bariatric surgery.  The patient attended our information session and reviewed the different types of bariatric surgery.    The patient is interested in the laparoscopic adjustable gastric band.  I discussed with the patient the indications and risks of lap band surgery.  The potential risks of surgery include, but are not limited to, bleeding, infection, DVT and PE, slippage and erosion of the band, open surgery, and death.  The patient understands the importance of compliance and long term follow-up with our group after surgery.  He has completed the pre op evaluation.  He is for surgery 09/09/2012.  2.  Sleep apnea  Since 2007 3.  CAD  History of multiple stents x 4 in 2004 and 2005  He did see Dr. Donnella Bi, now sees Dr. Ival Bible.  Cardiac clearance by Dr. Diona Browner. 4.  Hypertension 5.  Arthritis 6.  Hyperlipidemia  Though most recent labs normal - Cholesterol 165. 7.  History of upper GI bleed secondary to Bextra.  No problems since stopping meds 8.  Family history of colon cancer  Last colonoscopy by Dr. Gabriel Cirri in Sept 2013. 9. Multiple ortho procedures  Right shoulder, back, both knees.  Chief Complaint  Patient presents with  . Bariatric Pre-op    Lap band   REFERRING PHYSICIAN: Selinda Flavin, MD  HISTORY OF PRESENT ILLNESS: [name] is a 62 y.o. (DOB: [birth year])  whtie male whose primary care physician is Selinda Flavin, MD and comes to me today for pre op visit for surgery for weight loss.  He started his pre op diet this Monday, 08/26/2012.  I discussed the finding on the upper GI.  He does not have GERD.  I talked about evaluating and possibly repairing the [facility] seen on UGI.  It does add some risk to the surgery, but we now try to fix them when we see them.  I  answered questions about the surgery and evaluation.  One question about the psychiatrist, "What's that all about?"  No specific issues about the lap band.  UGI - 06/04/2012 - small sliding HH Cardiac - 06/04/2012 - Dr. Ival Bible - Overall low risk study with no large ischemic territories, LVEF 55%. This was done as part of preoperative evaluation, possible lap band procedure being considered. With symptomatically stable CAD and low risk ischemic testing, anticipate that he should be able to proceed with an acceptable perioperative cardiac risk on medical therapy. Please forward this note and a copy of stress test to [facility]. Psych - Dr. Milas Hock - 07/16/2012 - Cleared  History of Weight Problems: [name], who has a lap band.  He went to the information session that I talked about bariatric surgery.  He has tried multiple diets, including: Weight Watchers x4, Atkins diet x15, Nutrisystem, and other low-calorie diets. Most of his diets work well for 4 to 6 weeks, he loses a few pounds, then he is back to eating and gaining weight.   In the 1980s, he tried Dexatrim. He tried no other prescription weight-loss medicine.  He did just do a six-month program sponsored by [facility] with the pedometer, exercise recommendations, and diet.  He had a GI bleed after taking Bextra a number of years ago.  He still takes  Naproxen and we talked about NSAIDs and bariatric surgery.  No history of liver disease.  No history of gall bladder disease.  No history of pancreas disease. He had an appendectomy in the 1970's.  No history of colon disease.  He had a colonoscopy by Dr. Gabriel Cirri in Sept 2013.  His father and grandmother had colon cancer.  Past Medical History  Diagnosis Date  . Sleep apnea   . Coronary atherosclerosis of native coronary artery     Multiple stents - RCA/LAD/diagonal, overlapping DES LAD, stent thrombosis 2005  . Essential hypertension, benign   . Peripheral vascular disease   . Myocardial  infarction     Anterior with LAD stent thrombosis 2005  . Arthritis   . Hyperlipemia   . History of GI bleed      On nonsteroidals  . Aortic root dilatation     41 mm by CT 2008, 36 mm by echo 2011  . Obesity       Past Surgical History  Procedure Laterality Date  . Appendectomy    . Cholecystectomy    . Joint replacement      rt total knee-lt total knee  . Total knee arthroplasty  2006    rt  . Total knee arthroplasty  2007    lt  . Elbow arthroscopy      rt-bursa removed  . Shoulder arthroscopy  4/11    rt  . Cervical fusion  9/10  . Coronary angioplasty with stent placement  4540,9811  . Carpal tunnel release  6/12    lt  . Carpal tunnel release  06/06/2011    Procedure: CARPAL TUNNEL RELEASE;  Surgeon: Wyn Forster., MD;  Location: [facility];  Service: Orthopedics;  Laterality: Right;  . Ulnar nerve transposition  06/06/2011    Procedure: ULNAR NERVE DECOMPRESSION/TRANSPOSITION;  Surgeon: Wyn Forster., MD;  Location: [facility];  Service: Orthopedics;  Laterality: Right;  decompression ulnar nerve right cubital tunnel   . Breath tek h pylori  06/11/2012    Procedure: BREATH TEK H PYLORI;  Surgeon: Kandis Cocking, MD;  Location: [facility];  Service: General;  Laterality: N/ASue Lush      Current Outpatient Prescriptions  Medication Sig Dispense Refill  . aspirin 325 MG tablet Take 325 mg by mouth daily.        Marland Kitchen atorvastatin (LIPITOR) 20 MG tablet Take 20 mg by mouth every evening.      Randa Ngo 30 MG/ACT SOLN       . clopidogrel (PLAVIX) 75 MG tablet Take 75 mg by mouth daily.        . Coenzyme Q10 (CO Q-10) 200 MG CAPS Take 1 capsule by mouth daily.      . fish oil-omega-3 fatty acids 1000 MG capsule Takes maybe one per week      . l-methylfolate-B6-B12 (METANX) 3-35-2 MG TABS Take 1 tablet by mouth daily.      Marland Kitchen losartan-hydrochlorothiazide (HYZAAR) 100-25 MG per tablet Take 1 tablet by mouth daily.        .  metoprolol (LOPRESSOR) 50 MG tablet Take 25 mg by mouth 2 (two) times daily.      . naproxen (NAPROSYN) 500 MG tablet Take 500 mg by mouth 2 (two) times daily with a meal.        . nitroGLYCERIN (NITROSTAT) 0.4 MG SL tablet Place 0.4 mg under the tongue every 5 (five) minutes as needed.      Marland Kitchen  Pomegranate, Punica granatum, (POMEGRANATE PO) Take 435 mg by mouth daily.      . tadalafil (CIALIS) 20 MG tablet Take 20 mg by mouth daily as needed.        . TURMERIC PO Take 600 mg by mouth 2 (two) times daily.       No current facility-administered medications for this visit.      Allergies  Allergen Reactions  . Chlorhexidine Itching    Pt. States on last admission CHG wipes made him itch and it had to be washed off.  Uncertain if benadryl was given.    REVIEW OF SYSTEMS: Skin:  No history of rash.  No history of abnormal moles. Infection:  No history of hepatitis or HIV.  No history of MRSA. Neurologic:  No history of stroke.  No history of seizure.  No history of headaches. Cardiac: Hypertension.  Had coronary stents places in 2004 and 2005 in response to a heart attack.  He has had 4 stents placed.  His cardiologist was Dr. Donnella Bi, he is now seeing Dr. Ival Bible. Dr. Diona Browner gave clearance. Pulmonary:  Sleep apnea since 2007.  Endocrine:  No diabetes. No thyroid disease. Gastrointestinal:  See HPI. Urologic:  No history of kidney stones.  No history of bladder infections. Musculoskeletal:  Low back surgery in 1973, Dr. Vear Clock.  Knee replacement in 2006 and 2007 by Dr. Jim Desanctis.  Right rotator cuff surgery 2010. Right carpel tunnel by Dr. Teressa Senter, Dec. 2012. Hematologic:  No bleeding disorder.  No history of anemia.  Not anticoagulated. Psycho-social:  The patient is oriented.   The patient has no obvious psychologic or social impairment to understanding our conversation and plan.  SOCIAL and FAMILY HISTORY: Married.   Works as Product/process development scientist. Has 3 sons - 14, 61, and  59.  PHYSICAL EXAM: BP 122/80  Pulse 60  Temp(Src) 97 F (36.1 C)  Resp 18  Ht 5' 8.5" (1.74 m)  Wt 288 lb (130.636 kg)  BMI 43.15 kg/m2  General: WN obese WM who is alert and generally healthy appearing.  HEENT: Normal. Pupils equal. Neck: Supple. No mass.  No thyroid mass.  He has a "bull neck" Lymph Nodes:  No supraclavicular or cervical nodes. Lungs: Clear to auscultation and symmetric breath sounds. Heart:  RRR. No murmur or rub. Abdomen: Soft. No mass. No tenderness. No hernia. Normal bowel sounds. Scars from appendectomy and lap chole.  He is more apple than pear in shape and his abdomen looks bigger than a BMI 43. Extremities:  Good strength and ROM  in upper and lower extremities. Neurologic:  Grossly intact to motor and sensory function. Psychiatric: Has normal mood and affect. Behavior is normal.   DATA REVIEWED: Labs and consults reviewed.  Ovidio Kin, MD,  [facility] [address] [place]  [address]   [place], [place]    [address] Phone:  [phone] FAX:  [phone]

## 2012-08-28 NOTE — Progress Notes (Signed)
Dr Ezzard Standing-   Please add pre op orders- patient has appt PST 08/29/12  Thank you

## 2012-08-29 ENCOUNTER — Encounter (HOSPITAL_COMMUNITY): Payer: Self-pay | Admitting: Pharmacy Technician

## 2012-08-29 ENCOUNTER — Encounter (HOSPITAL_COMMUNITY): Payer: Self-pay

## 2012-08-29 ENCOUNTER — Other Ambulatory Visit (INDEPENDENT_AMBULATORY_CARE_PROVIDER_SITE_OTHER): Payer: Self-pay | Admitting: Surgery

## 2012-08-29 ENCOUNTER — Encounter (HOSPITAL_COMMUNITY)
Admission: RE | Admit: 2012-08-29 | Discharge: 2012-08-29 | Disposition: A | Discharge status: Home / Self Care | Payer: BC Managed Care – PPO | Source: Ambulatory Visit | Attending: Surgery | Admitting: Surgery

## 2012-08-29 ENCOUNTER — Ambulatory Visit (HOSPITAL_COMMUNITY)
Admission: RE | Admit: 2012-08-29 | Discharge: 2012-08-29 | Disposition: A | Discharge status: Home / Self Care | Payer: BC Managed Care – PPO | Source: Ambulatory Visit | Attending: Surgery | Admitting: Surgery

## 2012-08-29 DIAGNOSIS — Z01818 Encounter for other preprocedural examination: Secondary | ICD-10-CM | POA: Insufficient documentation

## 2012-08-29 DIAGNOSIS — Z01812 Encounter for preprocedural laboratory examination: Secondary | ICD-10-CM | POA: Insufficient documentation

## 2012-08-29 HISTORY — DX: Personal history of other diseases of the digestive system: Z87.19

## 2012-08-29 LAB — CBC WITH DIFFERENTIAL/PLATELET
Eosinophils Relative: 2 % (ref 0–5)
HCT: 44.3 % (ref 39.0–52.0)
Hemoglobin: 15.2 g/dL (ref 13.0–17.0)
Lymphocytes Relative: 21 % (ref 12–46)
Lymphs Abs: 1.9 10*3/uL (ref 0.7–4.0)
MCV: 87 fL (ref 78.0–100.0)
Monocytes Absolute: 1.3 10*3/uL — ABNORMAL HIGH (ref 0.1–1.0)
Monocytes Relative: 14 % — ABNORMAL HIGH (ref 3–12)
RBC: 5.09 MIL/uL (ref 4.22–5.81)
WBC: 8.8 10*3/uL (ref 4.0–10.5)

## 2012-08-29 LAB — COMPREHENSIVE METABOLIC PANEL
ALT: 23 U/L (ref 0–53)
BUN: 20 mg/dL (ref 6–23)
CO2: 26 mEq/L (ref 19–32)
Calcium: 9.5 mg/dL (ref 8.4–10.5)
Creatinine, Ser: 0.82 mg/dL (ref 0.50–1.35)
GFR calc Af Amer: >90 mL/min (ref >90)
GFR calc non Af Amer: >90 mL/min (ref >90)
Glucose, Bld: 101 mg/dL — ABNORMAL HIGH (ref 70–99)
Sodium: 135 mEq/L (ref 135–145)

## 2012-08-29 NOTE — Pre-Procedure Instructions (Signed)
EKG AND CARDIOLOGY OFFICE NOTE 04/11/12 DR. MCDOWELL IN [facility].  CXR WAS DONE TODAY.

## 2012-08-29 NOTE — Pre-Procedure Instructions (Signed)
Pt is allergic to chlorhexidine--was not given instructions to shower with.  Pt will shower with some other antibacterial soap.

## 2012-08-29 NOTE — Patient Instructions (Addendum)
YOUR SURGERY IS SCHEDULED AT [facility]  ON:  MONDAY  3/10  REPORT TO [place] [facility] AT:  9;15 AM      PHONE # FOR SHORT STAY IS [phone]  STOP NSAID'S ( NAPROSYN ) AND ASPIRIN AND HERBALS AND BLOOD THINNERS 7 DAYS BEFORE YOUR SURGERY-PER DR. Ezzard Standing.  USE ANTIBACTERIAL SOAP  TO SHOWER WITH THE TWO NIGHTS BEFORE YOUR SURGERY AND THE AM OF YOUR SURGERY.  DO NOT EAT OR DRINK ANYTHING AFTER MIDNIGHT THE NIGHT BEFORE YOUR SURGERY.  YOU MAY BRUSH YOUR TEETH, RINSE OUT YOUR MOUTH--BUT NO WATER, NO FOOD, NO CHEWING GUM, NO MINTS, NO CANDIES, NO CHEWING TOBACCO.  PLEASE TAKE THE FOLLOWING MEDICATIONS THE AM OF YOUR SURGERY WITH A FEW SIPS OF WATER:  METOPROLOL  IF YOU HAVE SLEEP APNEA AND USE CPAP OR BIPAP--PLEASE BRING THE MASK AND THE TUBING.  DO NOT BRING YOUR MACHINE.  DO NOT BRING VALUABLES, MONEY, CREDIT CARDS.  DO NOT WEAR JEWELRY, MAKE-UP, NAIL POLISH AND NO METAL PINS OR CLIPS IN YOUR HAIR. CONTACT LENS, DENTURES / PARTIALS, GLASSES SHOULD NOT BE WORN TO SURGERY AND IN MOST CASES-HEARING AIDS WILL NEED TO BE REMOVED.  BRING YOUR GLASSES CASE, ANY EQUIPMENT NEEDED FOR YOUR CONTACT LENS. FOR PATIENTS ADMITTED TO THE HOSPITAL--CHECK OUT TIME THE DAY OF DISCHARGE IS 11:00 AM.  ALL INPATIENT ROOMS ARE PRIVATE - WITH BATHROOM, TELEPHONE, TELEVISION AND WIFI INTERNET.                            PLEASE READ OVER ANY  FACT SHEETS THAT YOU WERE GIVEN: MRSA INFORMATION FAILURE TO FOLLOW THESE INSTRUCTIONS MAY RESULT IN THE CANCELLATION OF YOUR SURGERY.   PATIENT SIGNATURE_________________________________

## 2012-08-31 HISTORY — PX: LAPAROSCOPIC GASTRIC BANDING: SHX1100

## 2012-09-09 ENCOUNTER — Encounter (HOSPITAL_COMMUNITY): Payer: Self-pay | Admitting: *Deleted

## 2012-09-09 ENCOUNTER — Encounter (HOSPITAL_COMMUNITY): Payer: Self-pay | Admitting: Anesthesiology

## 2012-09-09 ENCOUNTER — Ambulatory Visit (HOSPITAL_COMMUNITY): Payer: BC Managed Care – PPO | Admitting: Anesthesiology

## 2012-09-09 ENCOUNTER — Observation Stay (HOSPITAL_COMMUNITY)
Admission: RE | Admit: 2012-09-09 | Discharge: 2012-09-10 | Disposition: A | Discharge status: Home / Self Care | Payer: BC Managed Care – PPO | Source: Ambulatory Visit | Attending: Surgery | Admitting: Surgery

## 2012-09-09 ENCOUNTER — Observation Stay (HOSPITAL_COMMUNITY): Payer: BC Managed Care – PPO

## 2012-09-09 ENCOUNTER — Encounter (HOSPITAL_COMMUNITY)
Admission: RE | Disposition: A | Discharge status: Home / Self Care | Payer: Self-pay | Source: Ambulatory Visit | Attending: Surgery

## 2012-09-09 DIAGNOSIS — G4730 Sleep apnea, unspecified: Secondary | ICD-10-CM | POA: Insufficient documentation

## 2012-09-09 DIAGNOSIS — Z800 Family history of malignant neoplasm of digestive organs: Secondary | ICD-10-CM | POA: Insufficient documentation

## 2012-09-09 DIAGNOSIS — I252 Old myocardial infarction: Secondary | ICD-10-CM | POA: Insufficient documentation

## 2012-09-09 DIAGNOSIS — I2510 Atherosclerotic heart disease of native coronary artery without angina pectoris: Secondary | ICD-10-CM

## 2012-09-09 DIAGNOSIS — Z9089 Acquired absence of other organs: Secondary | ICD-10-CM | POA: Insufficient documentation

## 2012-09-09 DIAGNOSIS — Z96659 Presence of unspecified artificial knee joint: Secondary | ICD-10-CM | POA: Insufficient documentation

## 2012-09-09 DIAGNOSIS — Z7982 Long term (current) use of aspirin: Secondary | ICD-10-CM | POA: Insufficient documentation

## 2012-09-09 DIAGNOSIS — Z6841 Body Mass Index (BMI) 40.0 and over, adult: Secondary | ICD-10-CM | POA: Insufficient documentation

## 2012-09-09 DIAGNOSIS — I739 Peripheral vascular disease, unspecified: Secondary | ICD-10-CM | POA: Insufficient documentation

## 2012-09-09 DIAGNOSIS — I10 Essential (primary) hypertension: Secondary | ICD-10-CM

## 2012-09-09 DIAGNOSIS — G4733 Obstructive sleep apnea (adult) (pediatric): Secondary | ICD-10-CM

## 2012-09-09 DIAGNOSIS — M129 Arthropathy, unspecified: Secondary | ICD-10-CM | POA: Insufficient documentation

## 2012-09-09 DIAGNOSIS — Z79899 Other long term (current) drug therapy: Secondary | ICD-10-CM | POA: Insufficient documentation

## 2012-09-09 DIAGNOSIS — E785 Hyperlipidemia, unspecified: Secondary | ICD-10-CM | POA: Insufficient documentation

## 2012-09-09 SURGERY — GASTRIC BANDING, LAPAROSCOPIC, WITH HIATAL HERNIA REPAIR
Anesthesia: General | Site: Abdomen | Wound class: Clean

## 2012-09-09 MED ORDER — MORPHINE SULFATE 2 MG/ML IJ SOLN
2.0000 mg | INTRAMUSCULAR | Status: DC | PRN
  Administered 2012-09-09: 4 mg via INTRAVENOUS
  Administered 2012-09-10: 2 mg via INTRAVENOUS
  Filled 2012-09-09: qty 2
  Filled 2012-09-09: qty 1

## 2012-09-09 MED ORDER — MIDAZOLAM HCL 5 MG/5ML IJ SOLN
INTRAMUSCULAR | Status: DC | PRN
  Administered 2012-09-09: 2 mg via INTRAVENOUS

## 2012-09-09 MED ORDER — HYDROCHLOROTHIAZIDE 25 MG PO TABS
25.0000 mg | ORAL_TABLET | Freq: Every day | ORAL | Status: DC
  Filled 2012-09-09: qty 1

## 2012-09-09 MED ORDER — LACTATED RINGERS IV SOLN
INTRAVENOUS | Status: DC
  Administered 2012-09-09: 1000 mL via INTRAVENOUS

## 2012-09-09 MED ORDER — DEXAMETHASONE SODIUM PHOSPHATE 10 MG/ML IJ SOLN
INTRAMUSCULAR | Status: DC | PRN
  Administered 2012-09-09: 10 mg via INTRAVENOUS

## 2012-09-09 MED ORDER — ACETAMINOPHEN 10 MG/ML IV SOLN
1000.0000 mg | Freq: Four times a day (QID) | INTRAVENOUS | Status: DC
  Administered 2012-09-09 – 2012-09-10 (×3): 1000 mg via INTRAVENOUS
  Filled 2012-09-09 (×5): qty 100

## 2012-09-09 MED ORDER — LIDOCAINE HCL (CARDIAC) 20 MG/ML IV SOLN
INTRAVENOUS | Status: DC | PRN
  Administered 2012-09-09: 100 mg via INTRAVENOUS

## 2012-09-09 MED ORDER — NITROGLYCERIN 0.4 MG SL SUBL: 0.4000 mg | SUBLINGUAL_TABLET | SUBLINGUAL | Status: DC | PRN

## 2012-09-09 MED ORDER — ACETAMINOPHEN 10 MG/ML IV SOLN
INTRAVENOUS | Status: DC | PRN
  Administered 2012-09-09: 1000 mg via INTRAVENOUS

## 2012-09-09 MED ORDER — HEPARIN SODIUM (PORCINE) 5000 UNIT/ML IJ SOLN
5000.0000 [IU] | INTRAMUSCULAR | Status: AC
  Administered 2012-09-09: 5000 [IU] via SUBCUTANEOUS
  Filled 2012-09-09: qty 1

## 2012-09-09 MED ORDER — ONDANSETRON HCL 4 MG/2ML IJ SOLN
INTRAMUSCULAR | Status: DC | PRN
  Administered 2012-09-09: 4 mg via INTRAVENOUS

## 2012-09-09 MED ORDER — HEPARIN SODIUM (PORCINE) 5000 UNIT/ML IJ SOLN
5000.0000 [IU] | Freq: Three times a day (TID) | INTRAMUSCULAR | Status: DC
  Administered 2012-09-09 – 2012-09-10 (×2): 5000 [IU] via SUBCUTANEOUS
  Filled 2012-09-09 (×5): qty 1

## 2012-09-09 MED ORDER — UNJURY CHOCOLATE CLASSIC POWDER
2.0000 [oz_av] | Freq: Four times a day (QID) | ORAL | Status: DC
  Administered 2012-09-10: 2 [oz_av] via ORAL

## 2012-09-09 MED ORDER — SODIUM CHLORIDE 0.9 % IJ SOLN
INTRAMUSCULAR | Status: DC | PRN
  Administered 2012-09-09: 20 mL via INTRAVENOUS

## 2012-09-09 MED ORDER — HYDROMORPHONE HCL PF 1 MG/ML IJ SOLN
0.2500 mg | INTRAMUSCULAR | Status: DC | PRN
  Administered 2012-09-09: 0.5 mg via INTRAVENOUS

## 2012-09-09 MED ORDER — POTASSIUM CHLORIDE IN NACL 20-0.45 MEQ/L-% IV SOLN
INTRAVENOUS | Status: DC
  Administered 2012-09-09 – 2012-09-10 (×2): via INTRAVENOUS
  Filled 2012-09-09 (×3): qty 1000

## 2012-09-09 MED ORDER — LOSARTAN POTASSIUM 50 MG PO TABS
100.0000 mg | ORAL_TABLET | Freq: Every day | ORAL | Status: DC
  Administered 2012-09-09 – 2012-09-10 (×2): 100 mg via ORAL
  Filled 2012-09-09 (×2): qty 2

## 2012-09-09 MED ORDER — FENTANYL CITRATE 0.05 MG/ML IJ SOLN
INTRAMUSCULAR | Status: DC | PRN
  Administered 2012-09-09 (×3): 50 ug via INTRAVENOUS
  Administered 2012-09-09: 100 ug via INTRAVENOUS

## 2012-09-09 MED ORDER — ROCURONIUM BROMIDE 100 MG/10ML IV SOLN
INTRAVENOUS | Status: DC | PRN
  Administered 2012-09-09 (×2): 10 mg via INTRAVENOUS
  Administered 2012-09-09: 40 mg via INTRAVENOUS

## 2012-09-09 MED ORDER — UNJURY CHICKEN SOUP POWDER: 2.0000 [oz_av] | Freq: Four times a day (QID) | ORAL | Status: DC

## 2012-09-09 MED ORDER — ACETAMINOPHEN 160 MG/5ML PO SOLN: 650.0000 mg | ORAL | Status: DC | PRN

## 2012-09-09 MED ORDER — LIP MEDEX EX OINT
TOPICAL_OINTMENT | CUTANEOUS | Status: AC
  Administered 2012-09-09: 17:00:00
  Filled 2012-09-09: qty 7

## 2012-09-09 MED ORDER — BUPIVACAINE-EPINEPHRINE 0.25% -1:200000 IJ SOLN
INTRAMUSCULAR | Status: DC | PRN
  Administered 2012-09-09: 30 mL

## 2012-09-09 MED ORDER — DEXTROSE 5 % IV SOLN
3.0000 g | INTRAVENOUS | Status: AC
  Administered 2012-09-09: 3 g via INTRAVENOUS
  Filled 2012-09-09: qty 3000

## 2012-09-09 MED ORDER — LACTATED RINGERS IV SOLN
INTRAVENOUS | Status: DC | PRN
  Administered 2012-09-09 (×2): via INTRAVENOUS

## 2012-09-09 MED ORDER — UNJURY VANILLA POWDER: 2.0000 [oz_av] | Freq: Four times a day (QID) | ORAL | Status: DC

## 2012-09-09 MED ORDER — SUCCINYLCHOLINE CHLORIDE 20 MG/ML IJ SOLN
INTRAMUSCULAR | Status: DC | PRN
  Administered 2012-09-09: 100 mg via INTRAVENOUS

## 2012-09-09 MED ORDER — LACTATED RINGERS IV SOLN
INTRAVENOUS | Status: DC
  Administered 2012-09-09: 13:00:00 via INTRAVENOUS

## 2012-09-09 MED ORDER — LOSARTAN POTASSIUM-HCTZ 100-25 MG PO TABS: 1.0000 | ORAL_TABLET | Freq: Every morning | ORAL | Status: DC

## 2012-09-09 MED ORDER — OXYCODONE-ACETAMINOPHEN 5-325 MG/5ML PO SOLN: 5.0000 mL | ORAL | Status: DC | PRN

## 2012-09-09 MED ORDER — PROPOFOL 10 MG/ML IV BOLUS
INTRAVENOUS | Status: DC | PRN
  Administered 2012-09-09: 200 mg via INTRAVENOUS

## 2012-09-09 MED ORDER — ONDANSETRON HCL 4 MG/2ML IJ SOLN: 4.0000 mg | INTRAMUSCULAR | Status: DC | PRN

## 2012-09-09 SURGICAL SUPPLY — 55 items
APL SKNCLS STERI-STRIP NONHPOA (GAUZE/BANDAGES/DRESSINGS)
BAND LAP 10.0 W/TUBES (Band) ×2 IMPLANT
BENZOIN TINCTURE PRP APPL 2/3 (GAUZE/BANDAGES/DRESSINGS) IMPLANT
BLADE HEX COATED 2.75 (ELECTRODE) ×2 IMPLANT
BLADE SURG 15 STRL LF DISP TIS (BLADE) ×1 IMPLANT
BLADE SURG 15 STRL SS (BLADE) ×2
CANISTER SUCTION 2500CC (MISCELLANEOUS) ×2 IMPLANT
CLOTH BEACON ORANGE TIMEOUT ST (SAFETY) ×2 IMPLANT
DECANTER SPIKE VIAL GLASS SM (MISCELLANEOUS) ×4 IMPLANT
DEVICE SUT QUICK LOAD TK 5 (STAPLE) ×6 IMPLANT
DEVICE SUT TI-KNOT TK 5X26 (MISCELLANEOUS) ×2 IMPLANT
DEVICE SUTURE ENDOST 10MM (ENDOMECHANICALS) ×1 IMPLANT
DISSECTOR BLUNT TIP ENDO 5MM (MISCELLANEOUS) IMPLANT
DRAPE CAMERA CLOSED 9X96 (DRAPES) ×2 IMPLANT
DRAPE UTILITY XL STRL (DRAPES) ×2 IMPLANT
ELECT REM PT RETURN 9FT ADLT (ELECTROSURGICAL) ×2
ELECTRODE REM PT RTRN 9FT ADLT (ELECTROSURGICAL) ×1 IMPLANT
GLOVE BIOGEL PI IND STRL 7.0 (GLOVE) ×1 IMPLANT
GLOVE BIOGEL PI INDICATOR 7.0 (GLOVE) ×1
GOWN STRL NON-REIN LRG LVL3 (GOWN DISPOSABLE) ×2 IMPLANT
GOWN STRL REIN XL XLG (GOWN DISPOSABLE) ×4 IMPLANT
HOVERMATT SINGLE USE (MISCELLANEOUS) ×2 IMPLANT
KIT BASIN OR (CUSTOM PROCEDURE TRAY) ×2 IMPLANT
MESH HERNIA 1X4 RECT BARD (Mesh General) IMPLANT
MESH HERNIA BARD 1X4 (Mesh General) ×1 IMPLANT
NDL SPNL 22GX3.5 QUINCKE BK (NEEDLE) ×1 IMPLANT
NEEDLE SPNL 22GX3.5 QUINCKE BK (NEEDLE) ×2 IMPLANT
NS IRRIG 1000ML POUR BTL (IV SOLUTION) ×2 IMPLANT
PACK UNIVERSAL I (CUSTOM PROCEDURE TRAY) ×2 IMPLANT
PENCIL BUTTON HOLSTER BLD 10FT (ELECTRODE) ×2 IMPLANT
SCALPEL HARMONIC ACE (MISCELLANEOUS) ×1 IMPLANT
SET IRRIG TUBING LAPAROSCOPIC (IRRIGATION / IRRIGATOR) IMPLANT
SLEEVE XCEL OPT CAN 5 100 (ENDOMECHANICALS) ×2 IMPLANT
SOLUTION ANTI FOG 6CC (MISCELLANEOUS) ×2 IMPLANT
SPONGE GAUZE 4X4 12PLY (GAUZE/BANDAGES/DRESSINGS) ×2 IMPLANT
SPONGE LAP 18X18 X RAY DECT (DISPOSABLE) ×2 IMPLANT
STAPLER VISISTAT 35W (STAPLE) ×2 IMPLANT
STRIP CLOSURE SKIN 1/2X4 (GAUZE/BANDAGES/DRESSINGS) IMPLANT
SUT ETHIBOND 2 0 SH (SUTURE) ×6
SUT ETHIBOND 2 0 SH 36X2 (SUTURE) ×3 IMPLANT
SUT MON AB 5-0 PS2 18 (SUTURE) ×2 IMPLANT
SUT PROLENE 2 0 CT2 30 (SUTURE) ×2 IMPLANT
SUT SILK 0 (SUTURE)
SUT SILK 0 30XBRD TIE 6 (SUTURE) IMPLANT
SUT SURGIDAC NAB ES-9 0 48 120 (SUTURE) ×1 IMPLANT
SUT VIC AB 2-0 SH 27 (SUTURE) ×2
SUT VIC AB 2-0 SH 27X BRD (SUTURE) IMPLANT
SYR 20CC LL (SYRINGE) IMPLANT
SYR 30ML LL (SYRINGE) ×2 IMPLANT
TOWEL OR 17X26 10 PK STRL BLUE (TOWEL DISPOSABLE) ×6 IMPLANT
TROCAR BLADELESS 15MM (ENDOMECHANICALS) ×2 IMPLANT
TROCAR BLADELESS OPT 5 100 (ENDOMECHANICALS) ×3 IMPLANT
TROCAR XCEL NON-BLD 11X100MML (ENDOMECHANICALS) ×2 IMPLANT
TUBE CALIBRATION LAPBAND (TUBING) ×2 IMPLANT
TUBING INSUFFLATION 10FT LAP (TUBING) ×2 IMPLANT

## 2012-09-09 NOTE — Progress Notes (Signed)
Dr Leta Jungling notifed that pt heart rate in 40's and dips to high 30's while asllep.  Otherwise, arouses to alert state quickly. All other VSS.  No other orders.

## 2012-09-09 NOTE — H&P (View-Only) (Signed)
 Re:   [name] DOB:   06/04/1951 MRN:   [id number]  ASSESSMENT AND PLAN: 1.  Morbid obesity, weight - 283, BMI - 42.5  Per the 1991 NIH Consensus Statement, the patient is a candidate for bariatric surgery.  The patient attended our information session and reviewed the different types of bariatric surgery.    The patient is interested in the laparoscopic adjustable gastric band.  I discussed with the patient the indications and risks of lap band surgery.  The potential risks of surgery include, but are not limited to, bleeding, infection, DVT and PE, slippage and erosion of the band, open surgery, and death.  The patient understands the importance of compliance and long term follow-up with our group after surgery.  He has completed the pre op evaluation.  He is for surgery 09/09/2012.  2.  Sleep apnea  Since 2007 3.  CAD  History of multiple stents x 4 in 2004 and 2005  He did see Dr. G. DeGent, now sees Dr. S. McDowell.  Cardiac clearance by Dr. McDowell. 4.  Hypertension 5.  Arthritis 6.  Hyperlipidemia  Though most recent labs normal - Cholesterol 165. 7.  History of upper GI bleed secondary to Bextra.  No problems since stopping meds 8.  Family history of colon cancer  Last colonoscopy by Dr. DeMason in Sept 2013. 9. Multiple ortho procedures  Right shoulder, back, both knees.  Chief Complaint  Patient presents with  . Bariatric Pre-op    Lap band   REFERRING PHYSICIAN: HOWARD, KEVIN, MD  HISTORY OF PRESENT ILLNESS: [name] is a 61 y.o. (DOB: 10/28/1950)  whtie male whose primary care physician is HOWARD, KEVIN, MD and comes to me today for pre op visit for surgery for weight loss.  He started his pre op diet this Monday, 08/26/2012.  I discussed the finding on the upper GI.  He does not have GERD.  I talked about evaluating and possibly repairing the HH seen on UGI.  It does add some risk to the surgery, but we now try to fix them when we see them.  I  answered questions about the surgery and evaluation.  One question about the psychiatrist, "What's that all about?"  No specific issues about the lap band.  UGI - 06/04/2012 - small sliding HH Cardiac - 06/04/2012 - Dr. S. McDowell - Overall low risk study with no large ischemic territories, LVEF 55%. This was done as part of preoperative evaluation, possible lap band procedure being considered. With symptomatically stable CAD and low risk ischemic testing, anticipate that he should be able to proceed with an acceptable perioperative cardiac risk on medical therapy. Please forward this note and a copy of stress test to [facility]. Psych - Dr. S. Cunningham - 07/16/2012 - Cleared  History of Weight Problems: Knows Karen Davis, who has a lap band.  He went to the information session that I talked about bariatric surgery.  He has tried multiple diets, including: Weight Watchers x4, Atkins diet x15, Nutrisystem, and other low-calorie diets. Most of his diets work well for 4 to 6 weeks, he loses a few pounds, then he is back to eating and gaining weight.   In the 1980s, he tried Dexatrim. He tried no other prescription weight-loss medicine.  He did just do a six-month program sponsored by [facility] with the pedometer, exercise recommendations, and diet.  He had a GI bleed after taking Bextra a number of years ago.  He still takes   Naproxen and we talked about NSAIDs and bariatric surgery.  No history of liver disease.  No history of gall bladder disease.  No history of pancreas disease. He had an appendectomy in the 1970's.  No history of colon disease.  He had a colonoscopy by Dr. DeMason in Sept 2013.  His father and grandmother had colon cancer.  Past Medical History  Diagnosis Date  . Sleep apnea   . Coronary atherosclerosis of native coronary artery     Multiple stents - RCA/LAD/diagonal, overlapping DES LAD, stent thrombosis 2005  . Essential hypertension, benign   . Peripheral vascular disease   . Myocardial  infarction     Anterior with LAD stent thrombosis 2005  . Arthritis   . Hyperlipemia   . History of GI bleed      On nonsteroidals  . Aortic root dilatation     41 mm by CT 2008, 36 mm by echo 2011  . Obesity       Past Surgical History  Procedure Laterality Date  . Appendectomy    . Cholecystectomy    . Joint replacement      rt total knee-lt total knee  . Total knee arthroplasty  2006    rt  . Total knee arthroplasty  2007    lt  . Elbow arthroscopy      rt-bursa removed  . Shoulder arthroscopy  4/11    rt  . Cervical fusion  9/10  . Coronary angioplasty with stent placement  2005,2004  . Carpal tunnel release  6/12    lt  . Carpal tunnel release  06/06/2011    Procedure: CARPAL TUNNEL RELEASE;  Surgeon: Robert V Sypher Jr., MD;  Location: [facility];  Service: Orthopedics;  Laterality: Right;  . Ulnar nerve transposition  06/06/2011    Procedure: ULNAR NERVE DECOMPRESSION/TRANSPOSITION;  Surgeon: Robert V Sypher Jr., MD;  Location: [facility];  Service: Orthopedics;  Laterality: Right;  decompression ulnar nerve right cubital tunnel   . Breath tek h pylori  06/11/2012    Procedure: BREATH TEK H PYLORI;  Surgeon: Maryssa Giampietro H Demetrious Rainford, MD;  Location: [facility];  Service: General;  Laterality: N/A;  Andrea      Current Outpatient Prescriptions  Medication Sig Dispense Refill  . aspirin 325 MG tablet Take 325 mg by mouth daily.        . atorvastatin (LIPITOR) 20 MG tablet Take 20 mg by mouth every evening.      . AXIRON 30 MG/ACT SOLN       . clopidogrel (PLAVIX) 75 MG tablet Take 75 mg by mouth daily.        . Coenzyme Q10 (CO Q-10) 200 MG CAPS Take 1 capsule by mouth daily.      . fish oil-omega-3 fatty acids 1000 MG capsule Takes maybe one per week      . l-methylfolate-B6-B12 (METANX) 3-35-2 MG TABS Take 1 tablet by mouth daily.      . losartan-hydrochlorothiazide (HYZAAR) 100-25 MG per tablet Take 1 tablet by mouth daily.        .  metoprolol (LOPRESSOR) 50 MG tablet Take 25 mg by mouth 2 (two) times daily.      . naproxen (NAPROSYN) 500 MG tablet Take 500 mg by mouth 2 (two) times daily with a meal.        . nitroGLYCERIN (NITROSTAT) 0.4 MG SL tablet Place 0.4 mg under the tongue every 5 (five) minutes as needed.      .   Pomegranate, Punica granatum, (POMEGRANATE PO) Take 435 mg by mouth daily.      . tadalafil (CIALIS) 20 MG tablet Take 20 mg by mouth daily as needed.        . TURMERIC PO Take 600 mg by mouth 2 (two) times daily.       No current facility-administered medications for this visit.      Allergies  Allergen Reactions  . Chlorhexidine Itching    Pt. States on last admission CHG wipes made him itch and it had to be washed off.  Uncertain if benadryl was given.    REVIEW OF SYSTEMS: Skin:  No history of rash.  No history of abnormal moles. Infection:  No history of hepatitis or HIV.  No history of MRSA. Neurologic:  No history of stroke.  No history of seizure.  No history of headaches. Cardiac: Hypertension.  Had coronary stents places in 2004 and 2005 in response to a heart attack.  He has had 4 stents placed.  His cardiologist was Dr. G. DeGent, he is now seeing Dr. S. McDowell. Dr. McDowell gave clearance. Pulmonary:  Sleep apnea since 2007.  Endocrine:  No diabetes. No thyroid disease. Gastrointestinal:  See HPI. Urologic:  No history of kidney stones.  No history of bladder infections. Musculoskeletal:  Low back surgery in 1973, Dr. Phillips.  Knee replacement in 2006 and 2007 by Dr. A. Collins.  Right rotator cuff surgery 2010. Right carpel tunnel by Dr. Sypher, Dec. 2012. Hematologic:  No bleeding disorder.  No history of anemia.  Not anticoagulated. Psycho-social:  The patient is oriented.   The patient has no obvious psychologic or social impairment to understanding our conversation and plan.  SOCIAL and FAMILY HISTORY: Married.   Works as general contractor. Has 3 sons - 24, 23, and  17.  PHYSICAL EXAM: BP 122/80  Pulse 60  Temp(Src) 97 F (36.1 C)  Resp 18  Ht 5' 8.5" (1.74 m)  Wt 288 lb (130.636 kg)  BMI 43.15 kg/m2  General: WN obese WM who is alert and generally healthy appearing.  HEENT: Normal. Pupils equal. Neck: Supple. No mass.  No thyroid mass.  He has a "bull neck" Lymph Nodes:  No supraclavicular or cervical nodes. Lungs: Clear to auscultation and symmetric breath sounds. Heart:  RRR. No murmur or rub. Abdomen: Soft. No mass. No tenderness. No hernia. Normal bowel sounds. Scars from appendectomy and lap chole.  He is more apple than pear in shape and his abdomen looks bigger than a BMI 43. Extremities:  Good strength and ROM  in upper and lower extremities. Neurologic:  Grossly intact to motor and sensory function. Psychiatric: Has normal mood and affect. Behavior is normal.   DATA REVIEWED: Labs and consults reviewed.  Icesis Renn, MD,  [facility] [address]  [address]   [place], [place]    [address] Phone:  [phone] FAX:  [phone]  

## 2012-09-09 NOTE — Anesthesia Postprocedure Evaluation (Signed)
  Anesthesia Post-op Note  Patient: [name]  Procedure(s) Performed: Procedure(s) (LRB): LAPAROSCOPIC GASTRIC BANDING WITH HIATAL HERNIA REPAIR (N/A)  Patient Location: PACU  Anesthesia Type: General  Level of Consciousness: awake and alert   Airway and Oxygen Therapy: Patient Spontanous Breathing  Post-op Pain: mild  Post-op Assessment: Post-op Vital signs reviewed, Patient's Cardiovascular Status Stable, Respiratory Function Stable, Patent Airway and No signs of Nausea or vomiting  Last Vitals:  Filed Vitals:   09/09/12 1419  BP: 168/79  Pulse: 49  Temp: 36.9 C  Resp: 14    Post-op Vital Signs: stable   Complications: No apparent anesthesia complications

## 2012-09-09 NOTE — Transfer of Care (Signed)
Immediate Anesthesia Transfer of Care Note  Patient: [name]  Procedure(s) Performed: Procedure(s) with comments: LAPAROSCOPIC GASTRIC BANDING WITH HIATAL HERNIA REPAIR (N/A) - lap band placement;  Patient Location: PACU  Anesthesia Type:General  Level of Consciousness: sedated  Airway & Oxygen Therapy: Patient Spontanous Breathing and Patient connected to face mask oxygen  Post-op Assessment: Report given to PACU RN and Post -op Vital signs reviewed and stable  Post vital signs: Reviewed and stable  Complications: No apparent anesthesia complications

## 2012-09-09 NOTE — Anesthesia Preprocedure Evaluation (Addendum)
Anesthesia Evaluation  Patient identified by MRN, date of birth, ID band Patient awake    Reviewed: Allergy & Precautions, H&P , NPO status , Patient's Chart, lab work & pertinent test results, reviewed documented beta blocker date and time   Airway Mallampati: III TM Distance: >3 FB Neck ROM: full    Dental no notable dental hx. (+) Teeth Intact and Dental Advisory Given   Pulmonary sleep apnea and Continuous Positive Airway Pressure Ventilation ,  breath sounds clear to auscultation  Pulmonary exam normal       Cardiovascular Exercise Tolerance: Good hypertension, Pt. on home beta blockers + CAD, + Past MI and + Cardiac Stents Rhythm:regular Rate:Normal  MI 2005 with LAD stent thrombosis.  Multiple stents.  No CP or problems with heart in years.   Neuro/Psych Cervical fusion negative neurological ROS  negative psych ROS   GI/Hepatic negative GI ROS, Neg liver ROS, hiatal hernia,   Endo/Other  negative endocrine ROSMorbid obesity  Renal/GU negative Renal ROS  negative genitourinary   Musculoskeletal   Abdominal (+) + obese,   Peds  Hematology negative hematology ROS (+)   Anesthesia Other Findings   Reproductive/Obstetrics negative OB ROS                          Anesthesia Physical Anesthesia Plan  ASA: III  Anesthesia Plan: General   Post-op Pain Management:    Induction: Intravenous  Airway Management Planned: Oral ETT  Additional Equipment:   Intra-op Plan:   Post-operative Plan: Extubation in OR  Informed Consent: I have reviewed the patients History and Physical, chart, labs and discussed the procedure including the risks, benefits and alternatives for the proposed anesthesia with the patient or authorized representative who has indicated his/her understanding and acceptance.   Dental Advisory Given  Plan Discussed with: CRNA and Surgeon  Anesthesia Plan Comments:          Anesthesia Quick Evaluation

## 2012-09-09 NOTE — Progress Notes (Signed)
Dr Leta Jungling aware of HR 43-50, no declerations to 30's observed. Authorized transport to floor.

## 2012-09-09 NOTE — Interval H&P Note (Signed)
History and Physical Interval Note:  09/09/2012 10:47 AM  [name]  has presented today for surgery, with the diagnosis of morbid obesity  The various methods of treatment have been discussed with the patient and family.  His wife, Johnny Bridge, is here today.  We also talked about the NSAID's that he takes.  Without the NSAID, he has arthritic pain, particularly in his feet.  Regarding continuing taking NSAID's, I talked about this being a risk/benefit decision he'll have to make after surgery.   After consideration of risks, benefits and other options for treatment, the patient has consented to  Procedure(s) with comments: LAPAROSCOPIC GASTRIC BANDING WITH HIATAL HERNIA REPAIR (N/A) - lap band placement; possible hiatal hernia repair as a surgical intervention .    The patient's history has been reviewed, patient examined, no change in status, stable for surgery.  I have reviewed the patient's chart and labs.  Questions were answered to the patient's satisfaction.     NEWMAN,DAVID H

## 2012-09-09 NOTE — Progress Notes (Signed)
RT set up CPAP machine for patient with his home tubing and nasal pillows. Sterile water added to fill line. Set to auto titrate 5cm H2O min, 20cm H2O max. RT encouraged him to call if he needed any further assistance.

## 2012-09-09 NOTE — Op Note (Signed)
09/09/2012  12:33 PM  PATIENT:  [name], 62 y.o., male, MRN: [id number]  PREOP DIAGNOSIS:  morbid obesity  POSTOP DIAGNOSIS:   Morbid Obesity, Weight - 283, BMI - 42.5  PROCEDURE:   Laparoscopic adjustable gastric band, AP Standard  SURGEON:   Ovidio Kin, M.D.  Threasa HeadsFredonia Highland, M.D.  ANESTHESIA:   general  Anesthesiologist: Gaetano Hawthorne, MD CRNA: Thornell Mule, CRNA; Doran Clay, CRNA  General  EBL:  Minimal  ml  BLOOD ADMINISTERED: none  LOCAL MEDICATIONS USED:   30 cc 1/4% marcaine  SPECIMEN:   None  COUNTS CORRECT:  YES  INDICATIONS FOR PROCEDURE:  [name] is a 62 y.o. (DOB: 1951-06-29) white  male whose primary care physician is Selinda Flavin, MD and comes for adjustable laparoscopic gastric band placement.   The indications and risks of the Lap Band surgery were explained to the patient.  The risks include, but are not limited to, infection, bleeding, bowel injury, and failure to lose weight.  Long term risk include, but are not limited to, slippage and erosion of the Lap Band.  OPERATIVE NOTE:  The patient was taken to room #[facility] at [facility] for the operation.  Dr. Roxanna Mew was the supervising anesthesiologist.  The patient's abdomen was prepped with Duraprep (he is allergic to chlorhexidine) and sterilely draped.  The patient was given Ancef at the beginning of the operation.   A time out was held and the surgical checklist reviewed.   The abdomen was accessed in the LUQ with an 5 mm trocar.  Five additional trocars were placed: a 5 mm trocar in the subxiphoid area for the liver retractor, a 5 mm trocar in the right costal margin, a 15 mm trocar to the right of midline, a 5 mm trocar to the left of midline and a 5 mm trocar in the left lateral subcostal area.   An abdominal exploration was carried out.  The liver was unremarkable.  The stomach was unremarkable.  The remainder of the bowel, which could be seen, was unremarkable.   A  test for a hiatal hernia was done using the sizing balloon.  The patient had a small hiatal hernia on his pre op UGI, but had no reflux. The sizing tube was advanced into the stomach, 15 cc of air and 10 cc of air was placed in the sizing balloon, and the balloon pulled back to the esophageal hiatus.  There was no evidence of a hiatal hernia, so therefore I did not do any type of repair.   I made an incision at the Angle of Hiss and identified the left crus.  I then opened the gastrohepatic ligament over the caudate lobe of the liver.  I identified the right crus and passed the "finger" dissector behind the gastroesophageal junction.   I then used a AP Standard Lap Band and inserted the silastic tubing through the tip of the finger dissector and pulled the tubing around the proximal stomach.  The sizing tube was passed into the stomach and the lap band closed over the sizing tube.  The sizing tube was removed.   I then imbricated the lateral stomach over the lap band and sewed it to the stomach proximal to the lap band.  I used 2-0 Ethibond sutures secured with Tye-Knots to create the gastro-gastric imbrication over the lateral Lap Band.  I placed 3 sutures. Photos were taken and placed in the chart.  The lap band and  tubing lay in good position.  The tubing was brought through the right lateral trocar site and attached to the lap band reservoir.  The reservoir has a piece of polypropylene mesh sewn on the back to fixate it in the subcutaneous tissues.   I infiltrated Marcaine into each incision for a total of 30 cc.   Each incision was sewn with 5-0 Monocryl sutures and the skin was painted with Dermabond.  The patient tolerated the procedure well and was sent to the recovery room in good condition.  The sponge and needle count were correct at the end of the case.   With his history of sleep apnea, we will keep him overnight.  Ovidio Kin, MD, [facility] Surgery Pager: [phone] Office  phone:  [phone]

## 2012-09-09 NOTE — Preoperative (Signed)
Beta Blockers   Reason not to administer Beta Blockers:Not Applicable 

## 2012-09-10 MED ORDER — OXYCODONE-ACETAMINOPHEN 5-325 MG/5ML PO SOLN: 5.0000 mL | ORAL | Status: DC | PRN

## 2012-09-10 NOTE — Progress Notes (Signed)
Patient is alert and oriented. VSS.  Patient denies nausea or vomiting.  Patient is passing gas no bowel movement.  Patient is ambulating in hallway and using incentive spirometry.  Patient is tolerating water, progressed to protein shakes.  The patient has follow up appointments with [facility] and [facility].  The following discharge instructions reviewed with patient in detail, reiterated the importance of sipping fluid slowly throughout the day.  Reviewed in detail the list of acceptable fluid, as well fluids that are not permitted.  Patient verbalized understanding.    [name]                                                                       ADJUSTABLE GASTRIC BAND  Home Care Instructions  These instructions are to help you care for yourself when you go home.  Call: If you have any problems.   Call [phone] and ask for the surgeon on call   If you need immediate assistance come to the ER at [facility]. Tell the ER staff that you are a new post-op gastric banding patient   Signs and symptoms to report:   Severe vomiting or nausea o If you cannot handle clear liquids for longer than 1 day, call your surgeon    Abdominal pain which does not get better after taking your pain medication   Fever greater than 100.4 F and chills   Heart rate over 100 beats a minute   Trouble breathing   Chest pain    Redness, swelling, drainage, or foul odor at incision (surgical) sites    If your incisions open or pull apart   Swelling or pain in calf (lower leg)   Diarrhea (Loose bowel movements that happen often), frequent watery, uncontrolled bowel movements   Constipation, (no bowel movements for 3 days) if this happens:  o Take Milk of Magnesia, 2 tablespoons by mouth, 3 times a day for 2 days if needed o Stop taking Milk of Magnesia once you have had a bowel movement o Call your doctor if constipation continues Or o Take Miralax  (instead of Milk of Magnesia) following the label  instructions o Stop taking Miralax once you have had a bowel movement o Call your doctor if constipation continues   Anything you think is "abnormal for you"   Normal side effects after surgery:   Unable to sleep at night or unable to concentrate   Irritability   Being tearful (crying) or depressed These are common complaints, possibly related to your anesthesia, stress of surgery, and change in lifestyle, that usually go away a few weeks after surgery.  If these feelings continue, call your medical doctor.   Wound Care: You may have surgical glue, steri-strips, or staples over your incisions after surgery   Surgical glue:  Looks like a clear film over your incisions and will wear off a little at a time   Steri-strips : Adhesive strips of tape over your incisions. You may notice a yellowish color on the skin under the steri-strips. This is used to make the   steri-strips stick better. Do not pull the steri-strips off - let them fall off   Staples: Staples may be removed before you leave the hospital o If  you go home with staples, call [facility] at for an appointment with your surgeon's nurse to have staples removed 10 days after surgery, [phone]   Showering: You may shower two (2) days after your surgery unless your surgeon tells you differently o Wash gently around incisions with warm soapy water, rinse well, and gently pat dry  o If you have a drain (tube from your incision), you may need someone to hold this while you shower  o No tub baths until staples are removed and incisions are healed     Medications:   Medications should be liquid or crushed if larger than the size of a dime   Extended release pills (medication that releases a little bit at a time through the day) should not be crushed   Depending on the size and number of medications you take, you may need to space (take a few throughout the day)/change the time you take your medications so that you do not  over-fill your pouch (smaller stomach)   Make sure you follow-up with your primary care physician to make medication changes needed during rapid weight loss and life-style changes   If you have diabetes, follow up with the doctor that orders your diabetes medication(s) within one week after surgery and check your blood sugar regularly.   Do not drive while taking narcotics (pain medications)   Do not take acetaminophen (Tylenol) and Roxicet or Lortab Elixir at the same time since these pain medications contain acetaminophen  Diet:                    First 2 Weeks  You will see the nutritionist about two (2) weeks after your surgery. The nutritionist will increase the types of foods you can eat if you are handling liquids well:   If you have severe vomiting or nausea and cannot handle clear liquids lasting longer than 1 day, call your surgeon  For Same Day Surgery Discharge Patients:    The day of surgery drink water only: 2 ounces every 4 hours   If you are handling water, start drinking your high protein shake the next morning For Overnight Stay Patients:    Begin by drinking 2 ounces of a high protein every 3 hours, 5 - 6 times per day   Slowly increase the amount you drink as tolerated   You may find it easier to slowly sip shakes throughout the day.  It is important to get your proteins in first Protein Shake   Drink at least 2 ounces of shake 5-6 times per day   Each serving of protein shakes (usually 8 - 12 ounces) should have a minimum of:  o 15 grams of protein  o And no more than 5 grams of carbohydrate    Goal for protein each day: o Men = 80 grams per day o Women = 60 grams per day   Protein powder may be added to fluids such as non-fat milk or Lactaid milk or Soy milk (limit to 35 grams added protein powder per serving)  Hydration   Slowly increase the amount of water and other clear liquids as tolerated (See Acceptable Fluids)   Slowly increase the amount of protein shake as  tolerated     Sip fluids slowly and throughout the day   May use sugar substitutes in small amounts (no more than 6 - 8 packets per day; i.e. Splenda)  Fluid Goal   The first goal is  to drink at least 8 ounces of protein shake/drink per day (or as directed by the nutritionist); some examples of protein shakes are [employer], [employer], EAS Edge HP, and Unjury. See handout from pre-op Bariatric Education Class: o Slowly increase the amount of protein shake you drink as tolerated o You may find it easier to slowly sip shakes throughout the day o It is important to get your proteins in first   Your fluid goal is to drink 64 - 100 ounces of fluid daily o It may take a few weeks to build up to this   32 oz (or more) should be clear liquids  And    32 oz (or more) should be full liquids (see below for examples)   Liquids should not contain sugar, caffeine, or carbonation  Clear Liquids:   Water or Sugar-free flavored water (i.e. Fruit H2O, Propel)   Decaffeinated coffee or tea (sugar-free)   Crystal Lite, Wyler's Lite, Minute Maid Lite   Sugar-free Jell-O   Bouillon or broth   Sugar-free Popsicle:   *Less than 20 calories each; Limit 1 per day            Full Liquids: Protein Shakes/Drinks + 2 choices per day of other full liquids   Full liquids must be: o No More Than 12 grams of Carbs per serving  o No More Than 3 grams of Fat per serving   Strained low-fat cream soup   Non-Fat milk   Fat-free Lactaid Milk   Sugar-free yogurt (Dannon Lite & Fit, Greek yogurt)   Vitamins and Minerals   Start 1 day after surgery unless otherwise directed by your surgeon   1 Chewable Multivitamin / Multimineral Supplement with iron (i.e. Centrum for Adults)   Chewable Calcium Citrate with Vitamin D-3 (Example: 3 Chewable Calcium Plus 600 with Vitamin D-3) o Take 500 mg three (3) times a day for a total of 1500 mg each day o Do not take all 3 doses of calcium at one time as it may cause  constipation, and you can only absorb 500 mg  at a time  o Do not mix multivitamins containing iron with calcium supplements; take 2 hours apart o Do not substitute Tums (calcium carbonate) for your calcium   Menstruating women and those at risk for anemia (a blood disease that causes weakness) may need extra iron o Talk with your doctor to see if you need more iron   If you need extra iron: Total daily Iron recommendation (including Vitamins) is 50 to 100 mg Iron/day   Do not stop taking or change any vitamins or minerals until you talk to your nutritionist or surgeon   Your nutritionist and/or surgeon must approve all vitamin and mineral supplements  Activity and Exercise: It is important to continue walking at home.  Limit your physical activity as instructed by your doctor.  During this time, use these guidelines:   Do not lift anything greater than ten (10) pounds for at least two (2) weeks   Do not go back to work or drive until Designer, industrial/product says you can   You may have sex when you feel comfortable  o It is VERY important for male patients to use a reliable birth control method; fertility often increases after surgery  o Do not get pregnant for at least 18 months   Start exercising as soon as your doctor tells you that you can o Make sure your doctor approves any physical activity  Start with a simple walking program   Walk 5-15 minutes each day, 7 days per week.    Slowly increase until you are walking 30-45 minutes per day   Consider joining our BELT program. [phone] or email [email or web address]   Special Instructions Things to remember:   Free counseling is available for you and your family through collaboration between [facility] and [place]. Please call [phone] and leave a message   Use your CPAP when sleeping if this applies to you    Consider buying a medical alert bracelet that says you had lap-band surgery    You will likely have your first fill (fluid added to your  band) 6 - 8 weeks after surgery   [facility] has a free Bariatric Surgery Support Group that meets monthly, the 3rd Thursday, 6 pm, [facility] Classrooms You can see classes online at [email or web address]   It is very important to keep all follow up appointments with your surgeon, nutritionist, primary care physician, and behavioral health practitioner o After the first year, please follow up with your bariatric surgeon and nutritionist at least once a year in order to maintain best weight loss results [facility]: [phone] [facility]: [phone] Bariatric Nurse Coordinator: [phone]   Reviewed and Endorsed  by [facility] Patient Education Committee, Jan, 2014

## 2012-09-10 NOTE — Discharge Summary (Signed)
Physician Discharge Summary  Patient ID:  [name]  MRN: [id number]  DOB/AGE: 1950-12-04 62 y.o.  Admit date: 09/09/2012 Discharge date: 09/10/2012  Discharge Diagnoses:  1. Morbid obesity, weight - 283, BMI - 42.5   2. Sleep apnea   Since 2007  3. CAD   History of multiple stents x 4 in 2004 and 2005   He did see Dr. Donnella Bi, now sees Dr. Ival Bible.   Cardiac clearance by Dr. Diona Browner.  4. Hypertension  5. Arthritis  6. Hyperlipidemia   Though most recent labs normal - Cholesterol 165.  7. History of upper GI bleed secondary to Bextra.   No problems since stopping meds  8. Family history of colon cancer   Last colonoscopy by Dr. Gabriel Cirri in Sept 2013.  9. Multiple ortho procedures   Right shoulder, back, both knees.   Operation: Procedure(s): LAPAROSCOPIC GASTRIC BANDING with AP Standard Lap Band on 09/09/2012 - D. Ezzard Standing  Discharged Condition: good  Hospital Course: [name] is an 62 y.o. male whose primary care physician is Selinda Flavin, MD and who was admitted 09/09/2012 with a chief complaint of Morbid Obesity.   He was brought to the operating room on 09/09/2012 and underwent  LAPAROSCOPIC GASTRIC BANDING with AP Standard Lap Band .   His KUB looks good, though the lap band is a little effaced.  He is one day post op.  He notices that water empties slowly.  He was instructed to go slow eating, which seemed to surprise him a little.  He will take protein drinks today and, if he does well, will be discharged home.  Consults: None  Significant Diagnostic Studies: Results for orders placed during the hospital encounter of 08/29/12  SURGICAL PCR SCREEN      Result Value Range   MRSA, PCR NEGATIVE  NEGATIVE   Staphylococcus aureus NEGATIVE  NEGATIVE  CBC WITH DIFFERENTIAL      Result Value Range   WBC 8.8  4.0 - 10.5 K/uL   RBC 5.09  4.22 - 5.81 MIL/uL   Hemoglobin 15.2  13.0 - 17.0 g/dL   HCT 11.9  14.7 - 82.9 %   MCV 87.0  78.0 - 100.0 fL   MCH  29.9  26.0 - 34.0 pg   MCHC 34.3  30.0 - 36.0 g/dL   RDW 56.2  13.0 - 86.5 %   Platelets 232  150 - 400 K/uL   Neutrophils Relative 62  43 - 77 %   Neutro Abs 5.5  1.7 - 7.7 K/uL   Lymphocytes Relative 21  12 - 46 %   Lymphs Abs 1.9  0.7 - 4.0 K/uL   Monocytes Relative 14 (*) 3 - 12 %   Monocytes Absolute 1.3 (*) 0.1 - 1.0 K/uL   Eosinophils Relative 2  0 - 5 %   Eosinophils Absolute 0.2  0.0 - 0.7 K/uL   Basophils Relative 0  0 - 1 %   Basophils Absolute 0.0  0.0 - 0.1 K/uL  COMPREHENSIVE METABOLIC PANEL      Result Value Range   Sodium 135  135 - 145 mEq/L   Potassium 3.9  3.5 - 5.1 mEq/L   Chloride 98  96 - 112 mEq/L   CO2 26  19 - 32 mEq/L   Glucose, Bld 101 (*) 70 - 99 mg/dL   BUN 20  6 - 23 mg/dL   Creatinine, Ser 7.84  0.50 - 1.35 mg/dL   Calcium 9.5  8.4 - 10.5 mg/dL   Total Protein 7.2  6.0 - 8.3 g/dL   Albumin 4.1  3.5 - 5.2 g/dL   AST 23  0 - 37 U/L   ALT 23  0 - 53 U/L   Alkaline Phosphatase 42  39 - 117 U/L   Total Bilirubin 0.4  0.3 - 1.2 mg/dL   GFR calc non Af Amer >90  >90 mL/min   GFR calc Af Amer >90  >90 mL/min    Dg Chest 2 View  08/29/2012  *RADIOLOGY REPORT*  Clinical Data: Preop for gastric lap band surgery.  CHEST - 2 VIEW  Comparison: 03/03/2009.  Findings: The cardiac silhouette, mediastinal and hilar contours are normal and stable.  The lungs are clear.  No pleural effusion. The bony thorax is intact.  IMPRESSION: No acute cardiopulmonary findings.  No change since prior study.   Original Report Authenticated By: Rudie Meyer, M.D.    Dg Abd 1 View  09/09/2012  *RADIOLOGY REPORT*  Clinical Data: Lap band placement.  ABDOMEN - 1 VIEW  Comparison: Upper GI dated 06/04/2012  Findings: Lap band has been inserted and appears in good position at the fundus of the stomach.  Bowel gas pattern is normal. Evidence of prior cholecystectomy.  IMPRESSION: Lap band appears in good position in the AP projection.   Original Report Authenticated By: Francene Boyers,  M.D.    Discharge Exam:  Filed Vitals:   09/10/12 0926  BP: 164/95  Pulse: 60  Temp:   Resp:     General: WN Obese wM who is alert and generally healthy appearing.  Lungs: Clear to auscultation and symmetric breath sounds. Heart:  RRR. No murmur or rub. Abdomen: Soft. No mass. No tenderness. No hernia. Normal bowel sounds. Incisions look good.  Discharge Medications:     Medication List    TAKE these medications       aspirin 325 MG tablet  Take 325 mg by mouth daily.     atorvastatin 20 MG tablet  Commonly known as:  LIPITOR  Take 20 mg by mouth every evening.     AXIRON 30 MG/ACT Soln  Generic drug:  Testosterone  Apply 1 Act topically daily. Apply to each armpit daily     cholecalciferol 1000 UNITS tablet  Commonly known as:  VITAMIN D  Take 1,000 Units by mouth daily.     clopidogrel 75 MG tablet  Commonly known as:  PLAVIX  Take 75 mg by mouth every morning.     Co Q-10 200 MG Caps  Take 1 capsule by mouth daily.     fish oil-omega-3 fatty acids 1000 MG capsule  Take 1 g by mouth 2 (two) times a week. Takes maybe one per week     losartan-hydrochlorothiazide 100-25 MG per tablet  Commonly known as:  HYZAAR  Take 1 tablet by mouth every morning.     metoprolol 50 MG tablet  Commonly known as:  LOPRESSOR  Take 50 mg by mouth 2 (two) times daily.     naproxen 500 MG tablet  Commonly known as:  NAPROSYN  Take 500 mg by mouth 2 (two) times daily with a meal.     nitroGLYCERIN 0.4 MG SL tablet  Commonly known as:  NITROSTAT  Place 0.4 mg under the tongue every 5 (five) minutes as needed for chest pain.     oxyCODONE-acetaminophen 5-325 MG/5ML solution  Commonly known as:  ROXICET  Take 5 mLs by mouth every 4 (four) hours  as needed for pain.     POMEGRANATE PO  Take 435 mg by mouth daily.     tadalafil 20 MG tablet  Commonly known as:  CIALIS  Take 20 mg by mouth daily as needed.     TURMERIC PO  Take 600 mg by mouth 2 (two) times daily.         Disposition: 01-Home or Self Care      Discharge Orders   Future Appointments Provider Department Dept Phone   09/24/2012 4:00 PM [facility] Post-Op Class [facility] [phone]   09/26/2012 9:20 AM Kandis Cocking, MD [facility], [place] [phone]   Future Orders Complete By Expires     Diet - low sodium heart healthy  As directed     Increase activity slowly  As directed       [facility] - DISCHARGE INSTRUCTIONS TO PATIENT  Return to work on:  2 weeks  Activity:  Driving - May drive in 2 or 3 days, if doing well   Lifting - No lifting > 15 pounds for 1 week, then as tolerated.  Wound Care:   May shower starting Wednesday, 09/11/2012.  Diet:  Post op lap band diet.  Follow up appointment:  Call Dr. Allene Pyo office [facility]) at [phone] for an appointment in 2 weeks.  Medications and dosages:  Resume your home medications.  You have a prescription for:  Roxicet             We also talked about limiting NSAID's.   Signed: Ovidio Kin, M.D., FACS  09/10/2012, 10:55 AM

## 2012-09-10 NOTE — Care Management Note (Signed)
    Page 1 of 1   09/10/2012     10:45:31 AM   CARE MANAGEMENT NOTE 09/10/2012  Patient:  [name], [name]   Account Number:  [id number]  Date Initiated:  09/10/2012  Documentation initiated by:  Lorenda Ishihara  Subjective/Objective Assessment:   62 yo male admitted s/p lap band procedure.     Action/Plan:   Home when stable   Anticipated DC Date:  09/10/2012   Anticipated DC Plan:  HOME/SELF CARE      DC Planning Services  CM consult      Choice offered to / List presented to:             Status of service:  Completed, signed off Medicare Important Message given?   (If response is "NO", the following Medicare IM given date fields will be blank) Date Medicare IM given:   Date Additional Medicare IM given:    Discharge Disposition:  HOME/SELF CARE  Per UR Regulation:  Reviewed for med. necessity/level of care/duration of stay  If discussed at Long Length of Stay Meetings, dates discussed:    Comments:

## 2012-09-24 ENCOUNTER — Encounter: Payer: Self-pay | Admitting: *Deleted

## 2012-09-24 ENCOUNTER — Encounter: Payer: BC Managed Care – PPO | Attending: Surgery | Admitting: *Deleted

## 2012-09-24 DIAGNOSIS — Z01818 Encounter for other preprocedural examination: Secondary | ICD-10-CM | POA: Insufficient documentation

## 2012-09-24 DIAGNOSIS — Z713 Dietary counseling and surveillance: Secondary | ICD-10-CM | POA: Insufficient documentation

## 2012-09-24 NOTE — Patient Instructions (Signed)
Patient to follow Phase 3A-Soft, High Protein Diet and follow-up at [facility] in 6 weeks for 2 months post-op nutrition visit for diet advancement. 

## 2012-09-24 NOTE — Progress Notes (Signed)
  Bariatric Class:  Appt start time: 1600 end time:  1700.  2 Week Post-Operative Nutrition Class  Patient was seen on 09/24/2012 for Post-Operative Nutrition education at [facility].   Surgery date: 09/09/12 Surgery type: LAGB Start weight at [facility]: 291.8 lbs  (06/11/12)  Weight today: 271.0 lbs Weight change: 20.7 lbs Total weight lost: 20.8 lbs  TANITA  BODY COMP RESULTS  09/24/12   BMI (kg/m^2) 40.6   Fat Mass (lbs) 124.5   Fat Free Mass (lbs) 146.5   Total Body Water (lbs) 107.5   The following the learning objectives were met by the patient during this course:  Identifies Phase 3A (Soft, High Proteins) Dietary Goals and will begin from 2 weeks post-operatively to 2 months post-operatively  Identifies appropriate sources of fluids and proteins   States protein recommendations and appropriate sources post-operatively  Identifies the need for appropriate texture modifications, mastication, and bite sizes when consuming solids  Identifies appropriate multivitamin and calcium sources post-operatively  Describes the need for physical activity post-operatively and will follow MD recommendations  States when to call healthcare provider regarding medication questions or post-operative complications  Handouts given during class include:  Phase 3A: Soft, High Protein Diet Handout  Band Fill Guidelines Handout  Follow-Up Plan: Patient will follow-up at [facility] in 6 weeks for 2 months post-op nutrition visit for diet advancement per MD.

## 2012-09-26 ENCOUNTER — Ambulatory Visit (INDEPENDENT_AMBULATORY_CARE_PROVIDER_SITE_OTHER): Payer: BC Managed Care – PPO | Admitting: Surgery

## 2012-09-26 ENCOUNTER — Encounter (INDEPENDENT_AMBULATORY_CARE_PROVIDER_SITE_OTHER): Payer: Self-pay | Admitting: Surgery

## 2012-09-26 DIAGNOSIS — Z9884 Bariatric surgery status: Secondary | ICD-10-CM

## 2012-09-26 NOTE — Progress Notes (Signed)
Re:   [name] DOB:   1950/09/20 MRN:   [id number]  ASSESSMENT AND PLAN: 1.  Morbid obesity, weight - 283, BMI - 42.5  Lap Band, APS - 09/09/2012.  Looks good.  Walking about hour a day.  To see back in 6 weeks.  2.  Sleep apnea  Since 2007 3.  CAD  History of multiple stents x 4 in 2004 and 2005  He did see Dr. Donnella Bi, now sees Dr. Ival Bible.  Cardiac clearance by Dr. Diona Browner. 4.  Hypertension 5.  Arthritis 6.  Hyperlipidemia  Though most recent labs normal - Cholesterol 165. 7.  History of upper GI bleed secondary to Bextra.  No problems since stopping meds 8.  Family history of colon cancer  Last colonoscopy by Dr. Gabriel Cirri in Sept 2013. 9. Multiple ortho procedures  Right shoulder, back, both knees.  Chief Complaint  Patient presents with  . Bariatric Follow Up   REFERRING PHYSICIAN: Selinda Flavin, MD  HISTORY OF PRESENT ILLNESS: [name] is a 62 y.o. (DOB: Feb 21, 1951)  whtie male whose primary care physician is Selinda Flavin, MD and comes to me today for follow up lap band surgery.  Looks good.  Walking about hour a day. 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  Has started protein diet and doing okay.  History of Weight Problems: Knows Jaquita Rector, who has a lap band.  He went to the information session that I talked about bariatric surgery.  He has tried multiple diets, including: Weight Watchers x4, Atkins diet x15, Nutrisystem, and other low-calorie diets. Most of his diets work well for 4 to 6 weeks, he loses a few pounds, then he is back to eating and gaining weight.   In the 1980s, he tried Dexatrim. He tried no other prescription weight-loss medicine.  He did just do a six-month program sponsored by [facility] with the pedometer, exercise recommendations, and diet.  He had a GI bleed after taking Bextra a number of years ago.  He still takes Naproxen and we talked about NSAIDs and bariatric surgery.  No history of liver  disease.  No history of gall bladder disease.  No history of pancreas disease. He had an appendectomy in the 1970's.  No history of colon disease.  He had a colonoscopy by Dr. Gabriel Cirri in Sept 2013.  His father and grandmother had colon cancer.  Past Medical History  Diagnosis Date  . Coronary atherosclerosis of native coronary artery     Multiple stents - RCA/LAD/diagonal, overlapping DES LAD, stent thrombosis 2005  . Essential hypertension, benign   . Peripheral vascular disease   . Myocardial infarction     Anterior with LAD stent thrombosis 2005  . Arthritis   . Hyperlipemia   . History of GI bleed      On nonsteroidals  . Aortic root dilatation     41 mm by CT 2008, 36 mm by echo 2011  . Obesity   . Sleep apnea     uses cpap daily  . H/O hiatal hernia       Past Surgical History  Procedure Laterality Date  . Appendectomy    . Cholecystectomy    . Joint replacement      rt total knee-lt total knee  . Total knee arthroplasty  2006    rt  . Total knee arthroplasty  2007    lt  . Elbow arthroscopy      rt-bursa removed  .  Shoulder arthroscopy  4/11    rt  . Cervical fusion  9/10  . Coronary angioplasty with stent placement  1191,4782  . Carpal tunnel release  6/12    lt  . Carpal tunnel release  06/06/2011    Procedure: CARPAL TUNNEL RELEASE;  Surgeon: Wyn Forster., MD;  Location: [facility];  Service: Orthopedics;  Laterality: Right;  . Ulnar nerve transposition  06/06/2011    Procedure: ULNAR NERVE DECOMPRESSION/TRANSPOSITION;  Surgeon: Wyn Forster., MD;  Location: [facility];  Service: Orthopedics;  Laterality: Right;  decompression ulnar nerve right cubital tunnel   . Breath tek h pylori  06/11/2012    Procedure: BREATH TEK H PYLORI;  Surgeon: Kandis Cocking, MD;  Location: [facility];  Service: General;  Laterality: N/ASue Lush      Current Outpatient Prescriptions  Medication Sig Dispense Refill  . aspirin 325 MG  tablet Take 325 mg by mouth daily.       Marland Kitchen atorvastatin (LIPITOR) 20 MG tablet Take 20 mg by mouth every evening.      Randa Ngo 30 MG/ACT SOLN Apply 1 Act topically daily. Apply to each armpit daily      . cholecalciferol (VITAMIN D) 1000 UNITS tablet Take 1,000 Units by mouth daily.      . clopidogrel (PLAVIX) 75 MG tablet Take 75 mg by mouth every morning.       . Coenzyme Q10 (CO Q-10) 200 MG CAPS Take 1 capsule by mouth daily.      . fish oil-omega-3 fatty acids 1000 MG capsule Take 1 g by mouth 2 (two) times a week. Takes maybe one per week      . losartan-hydrochlorothiazide (HYZAAR) 100-25 MG per tablet Take 1 tablet by mouth every morning.       . metoprolol (LOPRESSOR) 50 MG tablet Take 50 mg by mouth 2 (two) times daily.       . naproxen (NAPROSYN) 500 MG tablet Take 500 mg by mouth 2 (two) times daily with a meal.       . nitroGLYCERIN (NITROSTAT) 0.4 MG SL tablet Place 0.4 mg under the tongue every 5 (five) minutes as needed for chest pain.       Marland Kitchen oxyCODONE-acetaminophen (ROXICET) 5-325 MG/5ML solution Take 5 mLs by mouth every 4 (four) hours as needed for pain.  200 mL  0  . Pomegranate, Punica granatum, (POMEGRANATE PO) Take 435 mg by mouth daily.      . tadalafil (CIALIS) 20 MG tablet Take 20 mg by mouth daily as needed.       . TURMERIC PO Take 600 mg by mouth 2 (two) times daily.       No current facility-administered medications for this visit.      Allergies  Allergen Reactions  . Chlorhexidine Itching    Pt. States on last admission CHG wipes made him itch and it had to be washed off.  Uncertain if benadryl was given.    REVIEW OF SYSTEMS:  Cardiac: Hypertension.  Had coronary stents places in 2004 and 2005 in response to a heart attack.  He has had 4 stents placed.  His cardiologist was Dr. Donnella Bi, he is now seeing Dr. Ival Bible. Dr. Diona Browner gave clearance. Pulmonary:  Sleep apnea since 2007. Gastrointestinal:  See HPI.  Musculoskeletal:  Low back surgery  in 1973, Dr. Vear Clock.  Knee replacement in 2006 and 2007 by Dr. Jim Desanctis.  Right rotator cuff  surgery 2010. Right carpel tunnel by Dr. Teressa Senter, Dec. 2012. Hematologic:  No bleeding disorder.  No history of anemia.  Not anticoagulated. Psycho-social:  Psych - Dr. Milas Hock - 07/16/2012  SOCIAL and FAMILY HISTORY: Married.   Works as Product/process development scientist. Has 3 sons - 58, 46, and 46.  PHYSICAL EXAM: BP 122/79  Pulse 76  Temp(Src) 97 F (36.1 C) (Temporal)  Resp 12  Ht 5' 8.5" (1.74 m)  Wt 269 lb 6.4 oz (122.199 kg)  BMI 40.36 kg/m2  General: WN obese WM who is alert and generally healthy appearing.  HEENT: Normal. Pupils equal. Abdomen: Soft. No mass. No tenderness.Incisions look good.  DATA REVIEWED: No new data  Ovidio Kin, MD,  [facility], [place] [address]  [address]   [place], [place]    [address] Phone:  [phone] FAX:  [phone]

## 2012-10-31 ENCOUNTER — Ambulatory Visit (INDEPENDENT_AMBULATORY_CARE_PROVIDER_SITE_OTHER): Payer: BC Managed Care – PPO | Admitting: Surgery

## 2012-10-31 ENCOUNTER — Encounter (INDEPENDENT_AMBULATORY_CARE_PROVIDER_SITE_OTHER): Payer: Self-pay | Admitting: Surgery

## 2012-10-31 DIAGNOSIS — Z9884 Bariatric surgery status: Secondary | ICD-10-CM

## 2012-10-31 NOTE — Progress Notes (Signed)
Re:   [name] DOB:   [birth year] MRN:   [id number]  ASSESSMENT AND PLAN: 1.  Morbid obesity, weight - 283, BMI - 42.5  Lap Band, APS - 09/09/2012.  Looks good.  Walking about hour a day about 3 to 5 days per week.  I put in 2.5 cc to get him to 3.0 cc.  But he did not tolerate that, so I removed 1.5 cc to leave 1.5 cc.  To see back in 8 weeks.  2.  Sleep apnea  Since 2007 3.  CAD  History of multiple stents x 4 in 2004 and 2005  He did see Dr. Donnella Bi, now sees Dr. Ival Bible.  Cardiac clearance by Dr. Diona Browner. 4.  Hypertension 5.  Arthritis 6.  Hyperlipidemia  Though most recent labs normal - Cholesterol 165. 7.  History of upper GI bleed secondary to Bextra.  No problems since stopping meds 8.  Family history of colon cancer  Last colonoscopy by Dr. Gabriel Cirri in Sept 2013. 9. Multiple ortho procedures  Right shoulder, back, both knees.  Chief Complaint  Patient presents with  . Lap Band Fill    lap band fill   REFERRING PHYSICIAN: Selinda Flavin, MD  HISTORY OF PRESENT ILLNESS: [name] is a 62 y.o. (DOB: March 03, 1951)  whtie male whose primary care physician is Selinda Flavin, MD and comes to me today for follow up lap band surgery.  Looks good.  Walking about hour a day. 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  Has started protein diet and doing okay.  History of Weight Problems: Knows Jaquita Rector, who has a lap band.  He went to the information session that I talked about bariatric surgery.  He has tried multiple diets, including: Weight Watchers x4, Atkins diet x15, Nutrisystem, and other low-calorie diets. Most of his diets work well for 4 to 6 weeks, he loses a few pounds, then he is back to eating and gaining weight.   In the 1980s, he tried Dexatrim. He tried no other prescription weight-loss medicine.  He did just do a six-month program sponsored by [facility] with the pedometer, exercise recommendations, and diet.  He had a GI bleed  after taking Bextra a number of years ago.  He still takes Naproxen and we talked about NSAIDs and bariatric surgery.  No history of liver disease.  No history of gall bladder disease.  No history of pancreas disease. He had an appendectomy in the 1970's.  No history of colon disease.  He had a colonoscopy by Dr. Gabriel Cirri in Sept 2013.  His father and grandmother had colon cancer.  Past Medical History  Diagnosis Date  . Coronary atherosclerosis of native coronary artery     Multiple stents - RCA/LAD/diagonal, overlapping DES LAD, stent thrombosis 2005  . Essential hypertension, benign   . Peripheral vascular disease   . Myocardial infarction     Anterior with LAD stent thrombosis 2005  . Arthritis   . Hyperlipemia   . History of GI bleed      On nonsteroidals  . Aortic root dilatation     41 mm by CT 2008, 36 mm by echo 2011  . Obesity   . Sleep apnea     uses cpap daily  . H/O hiatal hernia       Past Surgical History  Procedure Laterality Date  . Appendectomy    . Cholecystectomy    . Joint replacement  rt total knee-lt total knee  . Total knee arthroplasty  2006    rt  . Total knee arthroplasty  2007    lt  . Elbow arthroscopy      rt-bursa removed  . Shoulder arthroscopy  4/11    rt  . Cervical fusion  9/10  . Coronary angioplasty with stent placement  4782,9562  . Carpal tunnel release  6/12    lt  . Carpal tunnel release  06/06/2011    Procedure: CARPAL TUNNEL RELEASE;  Surgeon: Wyn Forster., MD;  Location: [facility];  Service: Orthopedics;  Laterality: Right;  . Ulnar nerve transposition  06/06/2011    Procedure: ULNAR NERVE DECOMPRESSION/TRANSPOSITION;  Surgeon: Wyn Forster., MD;  Location: [facility];  Service: Orthopedics;  Laterality: Right;  decompression ulnar nerve right cubital tunnel   . Breath tek h pylori  06/11/2012    Procedure: BREATH TEK H PYLORI;  Surgeon: Kandis Cocking, MD;  Location: [facility];   Service: General;  Laterality: N/ASue Lush      Current Outpatient Prescriptions  Medication Sig Dispense Refill  . aspirin 325 MG tablet Take 325 mg by mouth daily.       Marland Kitchen atorvastatin (LIPITOR) 20 MG tablet Take 20 mg by mouth every evening.      Randa Ngo 30 MG/ACT SOLN Apply 1 Act topically daily. Apply to each armpit daily      . cholecalciferol (VITAMIN D) 1000 UNITS tablet Take 1,000 Units by mouth daily.      . clopidogrel (PLAVIX) 75 MG tablet Take 75 mg by mouth every morning.       . Coenzyme Q10 (CO Q-10) 200 MG CAPS Take 1 capsule by mouth daily.      . fish oil-omega-3 fatty acids 1000 MG capsule Take 1 g by mouth 2 (two) times a week. Takes maybe one per week      . losartan-hydrochlorothiazide (HYZAAR) 100-25 MG per tablet Take 1 tablet by mouth every morning.       . metoprolol (LOPRESSOR) 50 MG tablet Take 50 mg by mouth 2 (two) times daily.       . naproxen (NAPROSYN) 500 MG tablet Take 500 mg by mouth 2 (two) times daily with a meal.       . nitroGLYCERIN (NITROSTAT) 0.4 MG SL tablet Place 0.4 mg under the tongue every 5 (five) minutes as needed for chest pain.       Marland Kitchen oxyCODONE-acetaminophen (ROXICET) 5-325 MG/5ML solution Take 5 mLs by mouth every 4 (four) hours as needed for pain.  200 mL  0  . Pomegranate, Punica granatum, (POMEGRANATE PO) Take 435 mg by mouth daily.      . tadalafil (CIALIS) 20 MG tablet Take 20 mg by mouth daily as needed.       . TURMERIC PO Take 600 mg by mouth 2 (two) times daily.       No current facility-administered medications for this visit.      Allergies  Allergen Reactions  . Chlorhexidine Itching    Pt. States on last admission CHG wipes made him itch and it had to be washed off.  Uncertain if benadryl was given.    REVIEW OF SYSTEMS:  Cardiac: Hypertension.  Had coronary stents places in 2004 and 2005 in response to a heart attack.  He has had 4 stents placed.  His cardiologist was Dr. Donnella Bi, he is now seeing Dr. Kathie Rhodes.  [place]. Dr. Diona Browner gave clearance. Pulmonary:  Sleep apnea since 2007. Gastrointestinal:  See HPI.  Musculoskeletal:  Low back surgery in 1973, Dr. Vear Clock.  Knee replacement in 2006 and 2007 by Dr. Jim Desanctis.  Right rotator cuff surgery 2010. Right carpel tunnel by Dr. Teressa Senter, Dec. 2012. Hematologic:  No bleeding disorder.  No history of anemia.  Not anticoagulated. Psycho-social:  Psych - Dr. Milas Hock - 07/16/2012  SOCIAL and FAMILY HISTORY: Married.   Works as Product/process development scientist. Has 3 sons - 24, 28, and 59.  PHYSICAL EXAM: BP 127/74  Pulse 59  Resp 18  Ht 5' 8.5" (1.74 m)  Wt 270 lb 3.2 oz (122.562 kg)  BMI 40.48 kg/m2  General: WN obese WM who is alert and generally healthy appearing.  HEENT: Normal. Pupils equal. Abdomen: Soft. No mass. No tenderness.Incisions look good.  Had diastasis.  Procedure:  He had about 0.5 cc in the lap band.  I put in 2.5 cc to get him to 3.0 cc.  But he did not tolerate that, so I removed 1.5 cc to leave 1.5 cc.  He knows to go on liquids through the weekend.  As a reminder, he was tight early after surgery.  DATA REVIEWED: No new data  Ovidio Kin, MD,  [facility], [place] [address] [place]  [address]   [place], [place]    [address] Phone:  [phone] FAX:  [phone]

## 2012-11-05 ENCOUNTER — Encounter: Payer: BC Managed Care – PPO | Attending: Surgery | Admitting: *Deleted

## 2012-11-05 ENCOUNTER — Encounter: Payer: Self-pay | Admitting: *Deleted

## 2012-11-05 DIAGNOSIS — Z713 Dietary counseling and surveillance: Secondary | ICD-10-CM | POA: Insufficient documentation

## 2012-11-05 DIAGNOSIS — Z01818 Encounter for other preprocedural examination: Secondary | ICD-10-CM | POA: Insufficient documentation

## 2012-11-05 NOTE — Progress Notes (Signed)
Follow-up visit:  8 Weeks Post-Operative LAGB Surgery  Medical Nutrition Therapy:  Appt start time: 1045 end time:  1115.  Primary concerns today: Post-operative Bariatric Surgery Nutrition Management. States he gets so tired of chewing that he wants to stop.   Surgery date: 09/09/12 Surgery type: LAGB Start weight at [facility]: 291.8 lbs  (06/11/12)  Weight today: 268.5 lbs Weight change: 2.5 lbs Total weight lost: 23.3 lbs  Goal weight: 190-200 lbs % goal met: 23-25%  TANITA  BODY COMP RESULTS  09/24/12 11/05/12   BMI (kg/m^2) 40.6 40.2   Fat Mass (lbs) 124.5 122.5   Fat Free Mass (lbs) 146.5 146.0   Total Body Water (lbs) 107.5 107.0   24-hr recall: B (AM): 2-3 oz steak, 2 brussel sprouts (15-20g) Snk (AM): NONE or pc of cheese (0-6g) L (PM): 2-3 oz steak, 3-4 brussel sprouts OR Lean Cuisine (15-20g) Snk (PM): Sm Wendy's chili - (13g) D (PM): 3 oz hamburger - (15g) Snk (PM): NONE  Fluid intake: 5-6 glasses (20 oz) water, diet coke (24 oz) = 80-100 oz Estimated total protein intake: 60-70g  Medications:  No changes. Never took Roxicet Supplementation: Taking regularly  Using straws: No  Drinking while eating: No Hair loss: No Carbonated beverages: Yes; 24 oz diet coke daily N/V/D/C: No Last Lap-Band fill:  10/31/12 = 2.5 cc added; 1.5 cc removed @ time of visit d/t intolerance. Estimated total in band 1.5 cc;  is in the green zone  Recent physical activity:  Walking 3-5 days/week @ 60 min  Progress Towards Goal(s):  In progress.  Handouts given during visit include:  Phase 3B: High Protein + Non-Starchy Vegetables   Nutritional Diagnosis:  [place]-3.3 Overweight/obesity related to past poor dietary habits and physical inactivity as evidenced by patient w/ recent LAGB surgery following dietary guidelines for continued weight loss.    Intervention:  Nutrition education/diet advancement.  Monitoring/Evaluation:  Dietary intake, exercise, lap band fills, and body weight.  Follow up in 1 months for 3 month post-op visit.

## 2012-11-05 NOTE — Patient Instructions (Addendum)
Goals:  Follow Phase 3B: High Protein + Non-Starchy Vegetables  Increase lean protein foods to meet 80g goal  Increase fluid intake to 64oz +  Avoid drinking 15 minutes before, during and 30 minutes after eating  Aim for >30 min of physical activity daily

## 2013-01-02 ENCOUNTER — Encounter (INDEPENDENT_AMBULATORY_CARE_PROVIDER_SITE_OTHER): Payer: Self-pay | Admitting: Surgery

## 2013-01-02 ENCOUNTER — Ambulatory Visit (INDEPENDENT_AMBULATORY_CARE_PROVIDER_SITE_OTHER): Payer: BC Managed Care – PPO | Admitting: Surgery

## 2013-01-02 ENCOUNTER — Encounter: Payer: BC Managed Care – PPO | Attending: Surgery | Admitting: *Deleted

## 2013-01-02 ENCOUNTER — Encounter: Payer: Self-pay | Admitting: *Deleted

## 2013-01-02 VITALS — BP 150/92 | HR 80 | Temp 97.4°F | Resp 16 | Ht 68.5 in | Wt 268.4 lb

## 2013-01-02 DIAGNOSIS — Z9884 Bariatric surgery status: Secondary | ICD-10-CM

## 2013-01-02 DIAGNOSIS — Z713 Dietary counseling and surveillance: Secondary | ICD-10-CM | POA: Insufficient documentation

## 2013-01-02 DIAGNOSIS — Z01818 Encounter for other preprocedural examination: Secondary | ICD-10-CM | POA: Insufficient documentation

## 2013-01-02 NOTE — Patient Instructions (Addendum)
Goals:  Follow Phase 3B: High Protein + Non-Starchy Vegetables  Eat 3-6 small meals/snacks, every 3-5 hrs  Increase lean protein foods to meet 60-80g goal  Limit carbohydrate to 15 grams (fruit, whole grain, starchy vegetable) with meals  Avoid beer and wine - it is just empty calories  Avoid soda - pulls calcium out of your bones, potentially stretches pouch, increased sodium will increase blood pressure.   TANITA  BODY COMP RESULTS  09/24/12 11/05/12 01/02/13   BMI (kg/m^2) 40.6 40.2 40.4   Fat Mass (lbs) 124.5 122.5 107.5   Fat Free Mass (lbs) 146.5 146.0 162.0   Total Body Water (lbs) 107.5 107.0 118.5

## 2013-01-02 NOTE — Progress Notes (Addendum)
Re:   [name] DOB:   08-07-1950 MRN:   [id number]  ASSESSMENT AND PLAN: 1.  Morbid obesity, weight - 283, BMI - 42.5  Lap Band, APS - 09/09/2012.  Looks good.  Walking about hour a day about 3 to 5 days per week.  He had 2.0 cc and I added 1.0 cc for a total of 3.0 cc in his band.  I think he is a little tight, but he wants to try it.  To see back in 8 weeks.  2.  Sleep apnea  Since 2007 3.  CAD  History of multiple stents x 4 in 2004 and 2005  Cardiac clearance by Dr. Diona Browner. 4.  Hypertension 5.  Arthritis 6.  Hyperlipidemia  Though most recent labs normal - Cholesterol 165. 7.  History of upper GI bleed secondary to Bextra.  No problems since stopping meds 8.  Family history of colon cancer  Last colonoscopy by Dr. Gabriel Cirri in Sept 2013. 9. Multiple ortho procedures  Right shoulder, back, both knees.  Chief Complaint  Patient presents with  . Lap Band Fill   REFERRING PHYSICIAN: Selinda Flavin, MD  HISTORY OF PRESENT ILLNESS: [name] is a 62 y.o. (DOB: 1951-03-08)  whtie male whose primary care physician is Selinda Flavin, MD and comes to me today for follow up lap band surgery.  Looks good.  Walking about hour a day. He says can eat most foods except for rotisary chicken.  Most other foods he says go through without resistance. He saw Kasandra Knudsen earlier today who went over his body fat and lean muscle mass.  It sounds like there were no adjustments to his diet.  History of Weight Problems: Knows Jaquita Rector, who has a lap band.  He went to the information session that I talked about bariatric surgery.  He has tried multiple diets, including: Weight Watchers x4, Atkins diet x15, Nutrisystem, and other low-calorie diets. Most of his diets work well for 4 to 6 weeks, he loses a few pounds, then he is back to eating and gaining weight.   In the 1980s, he tried Dexatrim. He tried no other prescription weight-loss medicine.  He did just do a six-month program  sponsored by [facility] with the pedometer, exercise recommendations, and diet.  He had a GI bleed after taking Bextra a number of years ago.  He still takes Naproxen and we talked about NSAIDs and bariatric surgery.  No history of liver disease.  No history of gall bladder disease.  No history of pancreas disease. He had an appendectomy in the 1970's.  No history of colon disease.  He had a colonoscopy by Dr. Gabriel Cirri in Sept 2013.  His father and grandmother had colon cancer.  Past Medical History  Diagnosis Date  . Coronary atherosclerosis of native coronary artery     Multiple stents - RCA/LAD/diagonal, overlapping DES LAD, stent thrombosis 2005  . Essential hypertension, benign   . Peripheral vascular disease   . Myocardial infarction     Anterior with LAD stent thrombosis 2005  . Arthritis   . Hyperlipemia   . History of GI bleed      On nonsteroidals  . Aortic root dilatation     41 mm by CT 2008, 36 mm by echo 2011  . Obesity   . Sleep apnea     uses cpap daily  . H/O hiatal hernia      Past Surgical History  Procedure Laterality Date  .  Appendectomy    . Cholecystectomy    . Joint replacement      rt total knee-lt total knee  . Total knee arthroplasty  2006    rt  . Total knee arthroplasty  2007    lt  . Elbow arthroscopy      rt-bursa removed  . Shoulder arthroscopy  4/11    rt  . Cervical fusion  9/10  . Coronary angioplasty with stent placement  1610,9604  . Carpal tunnel release  6/12    lt  . Carpal tunnel release  06/06/2011    Procedure: CARPAL TUNNEL RELEASE;  Surgeon: Wyn Forster., MD;  Location: [facility];  Service: Orthopedics;  Laterality: Right;  . Ulnar nerve transposition  06/06/2011    Procedure: ULNAR NERVE DECOMPRESSION/TRANSPOSITION;  Surgeon: Wyn Forster., MD;  Location: [facility];  Service: Orthopedics;  Laterality: Right;  decompression ulnar nerve right cubital tunnel   . Breath tek h pylori  06/11/2012     Procedure: BREATH TEK H PYLORI;  Surgeon: Kandis Cocking, MD;  Location: [facility];  Service: General;  Laterality: N/ASue Lush      Current Outpatient Prescriptions  Medication Sig Dispense Refill  . aspirin 325 MG tablet Take 325 mg by mouth daily.       Marland Kitchen atorvastatin (LIPITOR) 20 MG tablet Take 20 mg by mouth every evening.      Randa Ngo 30 MG/ACT SOLN Apply 1 Act topically daily. Apply to each armpit daily      . cholecalciferol (VITAMIN D) 1000 UNITS tablet Take 1,000 Units by mouth daily.      . clopidogrel (PLAVIX) 75 MG tablet Take 75 mg by mouth every morning.       . Coenzyme Q10 (CO Q-10) 200 MG CAPS Take 1 capsule by mouth daily.      . fish oil-omega-3 fatty acids 1000 MG capsule Take 1 g by mouth 2 (two) times a week. Takes maybe one per week      . losartan-hydrochlorothiazide (HYZAAR) 100-25 MG per tablet Take 1 tablet by mouth every morning.       . metoprolol (LOPRESSOR) 50 MG tablet Take 50 mg by mouth 2 (two) times daily.       . naproxen (NAPROSYN) 500 MG tablet Take 500 mg by mouth 2 (two) times daily with a meal.       . nitroGLYCERIN (NITROSTAT) 0.4 MG SL tablet Place 0.4 mg under the tongue every 5 (five) minutes as needed for chest pain.       . Pomegranate, Punica granatum, (POMEGRANATE PO) Take 435 mg by mouth daily.      . tadalafil (CIALIS) 20 MG tablet Take 20 mg by mouth daily as needed.       . TURMERIC PO Take 600 mg by mouth 2 (two) times daily.       No current facility-administered medications for this visit.      Allergies  Allergen Reactions  . Chlorhexidine Itching    Pt. States on last admission CHG wipes made him itch and it had to be washed off.  Uncertain if benadryl was given.    REVIEW OF SYSTEMS:  Cardiac: Hypertension.  Had coronary stents places in 2004 and 2005 in response to a heart attack.  He has had 4 stents placed.  His cardiologist was Dr. Donnella Bi, he is now seeing Dr. Ival Bible. Dr. Diona Browner gave  clearance. Pulmonary:  Sleep  apnea since 2007. Gastrointestinal:  See HPI.  Musculoskeletal:  Low back surgery in 1973, Dr. Vear Clock.  Knee replacement in 2006 and 2007 by Dr. Jim Desanctis.  Right rotator cuff surgery 2010. Right carpel tunnel by Dr. Teressa Senter, Dec. 2012. Hematologic:  No bleeding disorder.  No history of anemia.  Not anticoagulated. Psycho-social:  Psych - Dr. Milas Hock - 07/16/2012  SOCIAL and FAMILY HISTORY: Married.   Works as Product/process development scientist. Has 3 sons - 52, 66, and 21.  PHYSICAL EXAM: BP 150/92  Pulse 80  Temp(Src) 97.4 F (36.3 C) (Temporal)  Resp 16  Ht 5' 8.5" (1.74 m)  Wt 268 lb 6.4 oz (121.745 kg)  BMI 40.21 kg/m2  General: WN obese WM who is alert and generally healthy appearing.  HEENT: Normal. Pupils equal. Abdomen: Soft. No mass. No tenderness.Incisions look good.  Had diastasis.  Procedure:  He had 2.0 cc and I added 1.0 cc for a total of 3.0 cc in his band. Water that he drank went down slow.  He knows to stay on liquids for 3 days (it is the 4th of July weekend).  DATA REVIEWED: No new data  Ovidio Kin, MD,  [facility], [place] [address] [place]  [address]   [place], [place]    [address] Phone:  [phone] FAX:  [phone]

## 2013-01-02 NOTE — Progress Notes (Addendum)
Follow-up visit:  12 Weeks Post-Operative LAGB Surgery  Medical Nutrition Therapy:  Appt start time: 1045   End time: 1115.  Primary concerns today: Post-operative Bariatric Surgery Nutrition Management. [name] returns for f/u with a 1.0 lb wt gain. Continues to consume excessive CHO and fat (3 handfuls of almonds) daily. Question long-term compliance at this point.  Surgery date: 09/09/12 Surgery type: LAGB Start weight at [facility]: 291.8 lbs  (06/11/12)  Weight today: 269.5 lbs Weight change: 1.0 lbs Total weight lost: 22.3 lbs  Goal weight: 190-200 lbs % goal met: 23-25%  TANITA  BODY COMP RESULTS  09/24/12 11/05/12 01/02/13   BMI (kg/m^2) 40.6 40.2 40.4   Fat Mass (lbs) 124.5 122.5 107.5   Fat Free Mass (lbs) 146.5 146.0 162.0   Total Body Water (lbs) 107.5 107.0 118.5   24-hr recall: B (AM): Smart Ones Beef Merlot meal OR 2 hard boiled eggs - (15-20g) Snk (AM): NONE or pc of cheese (0-6g) L (PM): Wendy's Chili or Southwest Salad w/ grilled chicken at [employer] (15-20g) Snk (PM):  NONE D (PM): 3 oz hamburger - (15g) Snk (PM): NONE  Fluid intake: 80 oz water, diet coke (24 oz), 3-4 Miller Lite = 80-100 oz Estimated total protein intake: 60-70g  Medications:  No changes. Never took Roxicet Supplementation: Taking regularly  Using straws: No  Drinking while eating: No Hair loss: No Carbonated beverages: Yes; 24 oz diet coke daily N/V/D/C: No Last Lap-Band fill: None since last visit on 10/31/12 = 2.5 cc added; 1.5 cc removed @ time of visit d/t intolerance. Estimated total in band 1.5 cc; On border of yellow/green zone  Recent physical activity:  Walking 4-5 days/week @ 60 min  Progress Towards Goal(s):  In progress.   Nutritional Diagnosis:  [place]-3.3 Overweight/obesity related to past poor dietary habits and physical inactivity as evidenced by patient w/ recent LAGB surgery following dietary guidelines for continued weight loss.    Intervention:  Nutrition education/diet  advancement.  Monitoring/Evaluation:  Dietary intake, exercise, lap band fills, and body weight. Follow up in 3 months for 9 month post-op visit.

## 2013-01-06 ENCOUNTER — Ambulatory Visit (INDEPENDENT_AMBULATORY_CARE_PROVIDER_SITE_OTHER): Payer: BC Managed Care – PPO | Admitting: General Surgery

## 2013-01-06 ENCOUNTER — Telehealth (INDEPENDENT_AMBULATORY_CARE_PROVIDER_SITE_OTHER): Payer: Self-pay | Admitting: General Surgery

## 2013-01-06 ENCOUNTER — Encounter (INDEPENDENT_AMBULATORY_CARE_PROVIDER_SITE_OTHER): Payer: Self-pay | Admitting: General Surgery

## 2013-01-06 VITALS — BP 140/76 | HR 60 | Resp 14 | Ht 68.5 in | Wt 252.0 lb

## 2013-01-06 DIAGNOSIS — Z4651 Encounter for fitting and adjustment of gastric lap band: Secondary | ICD-10-CM | POA: Insufficient documentation

## 2013-01-06 NOTE — Patient Instructions (Signed)
We removed 3/4 cc from your LAP-BAND today. Be sure to drink lots of water and rehydrate shelf.  Call us if there is no improvement If you're doing well, keep your appointment with Dr. Ezzard Standing in early September.

## 2013-01-06 NOTE — Telephone Encounter (Signed)
Patient called status post lap band fill on 01/02/2013 by Dr Ezzard Standing. Patient states he has not been able to keep solids or liquids down since Thursday. I paged Dr Ezzard Standing for advise. Awaiting call back. Dr Ezzard Standing called back and stated he could see him this am if he could be here prior to 10:00 am. I called patient back and he lives 45 minutes away. Added to urgent office this pm per Dr Ezzard Standing. Dr Ezzard Standing paged and made aware.

## 2013-01-06 NOTE — Progress Notes (Signed)
Patient ID: [name], male   DOB: 08-13-1950, 62 y.o.   MRN: [id number] History: This is Dr. Allene Pyo patient who underwent an adjustable lap band placement on 09/09/2012. Initially he had 2 cc in the band. Seen by Dr. Ezzard Standing on July 3 and another cc was placed bringing the total to 3 cc. The patient states that he cannot swallow water. . No pain. Wants less than a cc removed. Weight has gone from 268-252 in 4 days, according to our chart  Exam: Patient is in no distress. Alert. Conjunctiva slightly dry. BP 140/76. Pulse 60. Respirations 14. Weight 252 Abdomen soft and nontender. Following Betadine prep. Aspirated 3/4 cc in the lap band. The fluid was clear. He tolerated this well  Assessment: Urgent office visit for LAP-BAND adjustment and fluid removal. 3/3  cc removed today. Tolerated well  Plan: He was able to drink water He will keep appt. with Dr. Ezzard Standing in early September, sooner if there are any problems.   Angelia Mould. Derrell Lolling, M.D., [facility] General and Minimally invasive Surgery Breast and Colorectal Surgery Office:   [phone] Pager:   [phone]

## 2013-03-05 ENCOUNTER — Ambulatory Visit (INDEPENDENT_AMBULATORY_CARE_PROVIDER_SITE_OTHER): Payer: BC Managed Care – PPO | Admitting: Surgery

## 2013-03-05 ENCOUNTER — Encounter (INDEPENDENT_AMBULATORY_CARE_PROVIDER_SITE_OTHER): Payer: Self-pay | Admitting: Surgery

## 2013-03-05 VITALS — BP 132/72 | HR 68 | Temp 98.0°F | Resp 18 | Ht 68.5 in | Wt 265.6 lb

## 2013-03-05 DIAGNOSIS — Z9884 Bariatric surgery status: Secondary | ICD-10-CM

## 2013-03-05 NOTE — Progress Notes (Signed)
Re:   [name] DOB:   05/26/1951 MRN:   [id number]  ASSESSMENT AND PLAN: 1.  Morbid obesity, weight - 283, BMI - 42.5  Lap Band, APS - 09/09/2012.  He had fluid removed the prior visit.  I added 0.5 cc to 2.2 cc for a total of 2.7 cc.    He'll see me or Mardelle Matte back in 8 to 12 weeks. 2.  Sleep apnea  Since 2007 3.  CAD  History of multiple stents x 4 in 2004 and 2005  Cardiac clearance by Dr. Diona Browner. 4.  Hypertension 5.  Arthritis 6.  Hyperlipidemia  Though most recent labs normal - Cholesterol 165. 7.  History of upper GI bleed secondary to Bextra.  No problems since stopping meds 8.  Family history of colon cancer  Last colonoscopy by Dr. Gabriel Cirri in Sept 2013. 9. Multiple ortho procedures  Right shoulder, back, both knees.  No chief complaint on file.  REFERRING PHYSICIAN: Selinda Flavin, MD  HISTORY OF PRESENT ILLNESS: [name] is a 62 y.o. (DOB: 1950/07/10)  whtie male whose primary care physician is Selinda Flavin, MD and comes to me today for follow up lap band surgery.  He had trouble a couple of days after his last adjustment and saw Dr. Derrell Lolling.  Dr. Derrell Lolling wrote that he removed "3/3 cc", but I think it was less. The patient has "minimal" resistance.  He has trouble with brussel sprouts and some meats.  He still is down 3 pounds from when I last saw him.  I told him 1 to 3 pounds per month is actually a more realistic weight loss.  He was expecting greater. He also admits that he is not walking as much that last two weeks because of work. He has eggs for breakfast and is not having any trouble with these.  History of Weight Problems: Knows Jaquita Rector, who has a lap band.  He went to the information session that I talked about bariatric surgery.  He has tried multiple diets, including: Weight Watchers x4, Atkins diet x15, Nutrisystem, and other low-calorie diets. Most of his diets work well for 4 to 6 weeks, he loses a few pounds, then he is back to eating  and gaining weight.   In the 1980s, he tried Dexatrim. He tried no other prescription weight-loss medicine.  He did just do a six-month program sponsored by [facility] with the pedometer, exercise recommendations, and diet.  He had a GI bleed after taking Bextra a number of years ago.  He still takes Naproxen and we talked about NSAIDs and bariatric surgery.  No history of liver disease.  No history of gall bladder disease.  No history of pancreas disease. He had an appendectomy in the 1970's.  No history of colon disease.  He had a colonoscopy by Dr. Gabriel Cirri in Sept 2013.  His father and grandmother had colon cancer.  Past Medical History  Diagnosis Date  . Coronary atherosclerosis of native coronary artery     Multiple stents - RCA/LAD/diagonal, overlapping DES LAD, stent thrombosis 2005  . Essential hypertension, benign   . Peripheral vascular disease   . Myocardial infarction     Anterior with LAD stent thrombosis 2005  . Arthritis   . Hyperlipemia   . History of GI bleed      On nonsteroidals  . Aortic root dilatation     41 mm by CT 2008, 36 mm by echo 2011  . Obesity   . Sleep  apnea     uses cpap daily  . H/O hiatal hernia      Past Surgical History  Procedure Laterality Date  . Appendectomy    . Cholecystectomy    . Joint replacement      rt total knee-lt total knee  . Total knee arthroplasty  2006    rt  . Total knee arthroplasty  2007    lt  . Elbow arthroscopy      rt-bursa removed  . Shoulder arthroscopy  4/11    rt  . Cervical fusion  9/10  . Coronary angioplasty with stent placement  1610,9604  . Carpal tunnel release  6/12    lt  . Carpal tunnel release  06/06/2011    Procedure: CARPAL TUNNEL RELEASE;  Surgeon: Wyn Forster., MD;  Location: [facility];  Service: Orthopedics;  Laterality: Right;  . Ulnar nerve transposition  06/06/2011    Procedure: ULNAR NERVE DECOMPRESSION/TRANSPOSITION;  Surgeon: Wyn Forster., MD;  Location: [facility];  Service: Orthopedics;  Laterality: Right;  decompression ulnar nerve right cubital tunnel   . Breath tek h pylori  06/11/2012    Procedure: BREATH TEK H PYLORI;  Surgeon: Kandis Cocking, MD;  Location: [facility];  Service: General;  Laterality: N/ASue Lush      Current Outpatient Prescriptions  Medication Sig Dispense Refill  . aspirin 325 MG tablet Take 325 mg by mouth daily.       Marland Kitchen atorvastatin (LIPITOR) 20 MG tablet Take 20 mg by mouth every evening.      Randa Ngo 30 MG/ACT SOLN Apply 1 Act topically daily. Apply to each armpit daily      . cholecalciferol (VITAMIN D) 1000 UNITS tablet Take 1,000 Units by mouth daily.      . clopidogrel (PLAVIX) 75 MG tablet Take 75 mg by mouth every morning.       . Coenzyme Q10 (CO Q-10) 200 MG CAPS Take 1 capsule by mouth daily.      . fish oil-omega-3 fatty acids 1000 MG capsule Take 1 g by mouth 2 (two) times a week. Takes maybe one per week      . losartan-hydrochlorothiazide (HYZAAR) 100-25 MG per tablet Take 1 tablet by mouth every morning.       . metoprolol (LOPRESSOR) 50 MG tablet Take 50 mg by mouth 2 (two) times daily.       . naproxen (NAPROSYN) 500 MG tablet Take 500 mg by mouth 2 (two) times daily with a meal.       . nitroGLYCERIN (NITROSTAT) 0.4 MG SL tablet Place 0.4 mg under the tongue every 5 (five) minutes as needed for chest pain.       . Pomegranate, Punica granatum, (POMEGRANATE PO) Take 435 mg by mouth daily.      . tadalafil (CIALIS) 20 MG tablet Take 20 mg by mouth daily as needed.       . TURMERIC PO Take 600 mg by mouth 2 (two) times daily.       No current facility-administered medications for this visit.      Allergies  Allergen Reactions  . Chlorhexidine Itching    Pt. States on last admission CHG wipes made him itch and it had to be washed off.  Uncertain if benadryl was given.    REVIEW OF SYSTEMS:  Cardiac: Hypertension.  Had coronary stents places in 2004 and 2005 in response to a  heart attack.  He has had 4 stents placed.  His cardiologist was Dr. Donnella Bi, he is now seeing Dr. Ival Bible. Dr. Diona Browner gave clearance. Pulmonary:  Sleep apnea since 2007. Gastrointestinal:  See HPI.  Musculoskeletal:  Low back surgery in 1973, Dr. Vear Clock.  Knee replacement in 2006 and 2007 by Dr. Jim Desanctis.  Right rotator cuff surgery 2010. Right carpel tunnel by Dr. Teressa Senter, Dec. 2012. Hematologic:  No bleeding disorder.  No history of anemia.  Not anticoagulated. Psycho-social:  Psych - Dr. Milas Hock - 07/16/2012  SOCIAL and FAMILY HISTORY: Married.   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  PHYSICAL EXAM: Ht 5' 8.5" (1.74 m)  Wt 265 lb 9.6 oz (120.475 kg)  BMI 39.79 kg/m2  General: WN obese WM who is alert and generally healthy appearing.  HEENT: Normal. Pupils equal. Abdomen: Soft. No mass. No tenderness. Has diastasis.  Port in RUQ.  Procedure:  He had 2.2 cc in his band.  I added 0.5 cc today. He tolerated water  DATA REVIEWED: No new data  Ovidio Kin, MD,  [facility], [place] [address] [place]  [address]   [place], [place]    [address] Phone:  [phone] FAX:  [phone]

## 2013-04-04 ENCOUNTER — Encounter (HOSPITAL_BASED_OUTPATIENT_CLINIC_OR_DEPARTMENT_OTHER): Payer: Self-pay | Admitting: *Deleted

## 2013-04-04 NOTE — Progress Notes (Signed)
Pt has had several surgeries here with dr sypher-general-had lap band 3/14 has lost 24lb-does use cpap-will bring and use post op-will need istat-had cardiac clearance for lap bad-work up 10/13- Will stay on plavix per dr sypher

## 2013-04-08 ENCOUNTER — Other Ambulatory Visit: Payer: Self-pay | Admitting: Orthopedic Surgery

## 2013-04-09 NOTE — H&P (Signed)
[name] is an 62 y.o. male.   Chief Complaint: c/o recurrent numbness and tingling right hand. HPI: .  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  I met [name] back in 2012 when he reported having bilateral hand numbness.  He had had a prior right carpal tunnel release performed in [place], [place] in 2002.  Clinical examination revealed signs of significant right thumb and left thumb carpometacarpal arthritis and bilateral carpal tunnel syndrome.  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   He, subsequently, underwent bilateral carpal tunnel release and right ulnar nerve decompression of the cubital tunnel in situ.  He reports that his right hand numbness completely resolved after his 2002 surgery for about eight years.  Following his 2012 surgery he had 85% relief of his numbness for more than one year.  During the past six months he has had recurrent dysesthesias in the median innervated fingers of the right hand. He is now dropping his razor when he shaves at times and has difficulty handling small objects with the right hand.  He has numbness occasionally in the ring and small fingers of the left hand. He was worried he might have left ulnar nerve impairment.     Past Medical History  Diagnosis Date  . Coronary atherosclerosis of native coronary artery     Multiple stents - RCA/LAD/diagonal, overlapping DES LAD, stent thrombosis 2005  . Essential hypertension, benign   . Peripheral vascular disease   . Myocardial infarction     Anterior with LAD stent thrombosis 2005  . Arthritis   . Hyperlipemia   . History of GI bleed      On nonsteroidals  . Aortic root dilatation     41 mm by CT 2008, 36 mm by echo 2011  . Obesity   . H/O hiatal hernia   . Sleep apnea     uses cpap daily    Past Surgical History  Procedure Laterality Date  . Appendectomy    . Cholecystectomy    . Joint  replacement      rt total knee-lt total knee  . Total knee arthroplasty  2006    rt  . Total knee arthroplasty  2007    lt  . Elbow arthroscopy      rt-bursa removed  . Shoulder arthroscopy  4/11    rt  . Cervical fusion  9/10  . Coronary angioplasty with stent placement  5621,[id number]  . Carpal tunnel release  6/12    lt  . Carpal tunnel release  06/06/2011    Procedure: CARPAL TUNNEL RELEASE;  Surgeon: Wyn Forster., MD;  Location: [facility];  Service: Orthopedics;  Laterality: Right;  . Ulnar nerve transposition  06/06/2011    Procedure: ULNAR NERVE DECOMPRESSION/TRANSPOSITION;  Surgeon: Wyn Forster., MD;  Location: [facility];  Service: Orthopedics;  Laterality: Right;  decompression ulnar nerve right cubital tunnel   . Breath tek h pylori  06/11/2012    Procedure: BREATH TEK H PYLORI;  Surgeon: Kandis Cocking, MD;  Location: [facility];  Service: General;  Laterality: N/A;  Sue Lush  . Laparoscopic gastric banding  3/14    Family History  Problem Relation Age of Onset  . Coronary artery disease    . Cancer    . Cancer Father     colon  . Cancer Maternal Grandmother     colon  . Obesity  Mother   . Diabetes Maternal Grandfather    Social History:  reports that he has never smoked. He quit smokeless tobacco use about 9 years ago. He reports that he drinks alcohol. He reports that he does not use illicit drugs.  Allergies:  Allergies  Allergen Reactions  . Chlorhexidine Itching    Pt. States on last admission CHG wipes made him itch and it had to be washed off.  Uncertain if benadryl was given.    No prescriptions prior to admission    No results found for this or any previous visit (from the past 48 hour(s)).  No results found.   Pertinent items are noted in HPI.  Height 5' 8.5" (1.74 m), weight 117.482 kg (259 lb).  General appearance: alert Head: Normocephalic, without obvious abnormality Neck: supple, symmetrical,  trachea midline Resp: clear to auscultation bilaterally Cardio: regular rate and rhythm GI: normal findings: bowel sounds normal Extremities:  Inspection of his hands reveals no thenar atrophy, his scars are well healed.  He makes a dense scar.  He has clinical signs of entrapment neuropathy of the right median nerve and possible instability of the right ulnar nerve at the cubital tunnel.  He does not show any signs of carpal tunnel syndrome clinically on the left, his sweat patterns and dermatoglyphics are normal.  He has normal intrinsic muscle bulk bilaterally.   I asked Dr. Johna Roles to repeat electrodiagnostic studies and compare today's study with the studies from April of 2014.  [name]' right median nerve conduction parameters are actually a bit worse than they were in 2012.  His ulnar conduction across the right elbow has improved by 2.9 meters per second.  He still has mild slowing of the motor and sensory conductions of the left median nerve with improved sensory amplitude on the left.   MRI of his right wrist: He demonstrates a number of salient findings that explain his recurrent carpal tunnel syndrome. [name] has had a long standing scapholunate instability. His lunate is now rotating into a dorsiflexed posture making it more prominent volarly. He has also developed a bone spur on the volar aspect of his radius that is encroaching on the dorsal aspect of the carpal canal.    Pulses: 2+ and symmetric Skin: normal Neurologic: Grossly normal    Assessment/Plan:  Impression: Right wrist recurrent CTS with S-L instability  Plan:To the OR for revision of right CTR with exploration of carpal canal and possible excision of carpal osteophytes.The procedure, risks,benefits and post-op course were discussed with the patient at length and they were in agreement with the plan.  DASNOIT,Efren Kross J 04/09/2013, 1:10 PM  H&P documentation: 04/10/2013  -History and Physical Reviewed  -Patient  has been re-examined  -No change in the plan of care  Wyn Forster, MD

## 2013-04-10 ENCOUNTER — Ambulatory Visit (HOSPITAL_BASED_OUTPATIENT_CLINIC_OR_DEPARTMENT_OTHER)
Admission: RE | Admit: 2013-04-10 | Discharge: 2013-04-10 | Disposition: A | Discharge status: Home / Self Care | Payer: BC Managed Care – PPO | Source: Ambulatory Visit | Attending: Orthopedic Surgery | Admitting: Orthopedic Surgery

## 2013-04-10 ENCOUNTER — Ambulatory Visit (HOSPITAL_BASED_OUTPATIENT_CLINIC_OR_DEPARTMENT_OTHER): Payer: BC Managed Care – PPO | Admitting: Anesthesiology

## 2013-04-10 ENCOUNTER — Encounter (HOSPITAL_BASED_OUTPATIENT_CLINIC_OR_DEPARTMENT_OTHER): Payer: BC Managed Care – PPO | Admitting: Anesthesiology

## 2013-04-10 ENCOUNTER — Encounter (HOSPITAL_BASED_OUTPATIENT_CLINIC_OR_DEPARTMENT_OTHER): Payer: Self-pay | Admitting: *Deleted

## 2013-04-10 ENCOUNTER — Encounter (HOSPITAL_BASED_OUTPATIENT_CLINIC_OR_DEPARTMENT_OTHER)
Admission: RE | Disposition: A | Discharge status: Home / Self Care | Payer: Self-pay | Source: Ambulatory Visit | Attending: Orthopedic Surgery

## 2013-04-10 DIAGNOSIS — G5600 Carpal tunnel syndrome, unspecified upper limb: Secondary | ICD-10-CM | POA: Insufficient documentation

## 2013-04-10 DIAGNOSIS — I10 Essential (primary) hypertension: Secondary | ICD-10-CM | POA: Insufficient documentation

## 2013-04-10 DIAGNOSIS — IMO0002 Reserved for concepts with insufficient information to code with codable children: Secondary | ICD-10-CM | POA: Insufficient documentation

## 2013-04-10 HISTORY — PX: CARPAL TUNNEL RELEASE: SHX101

## 2013-04-10 LAB — POCT I-STAT, CHEM 8
Calcium, Ion: 1.12 mmol/L — ABNORMAL LOW (ref 1.13–1.30)
Chloride: 103 mEq/L (ref 96–112)
Creatinine, Ser: 0.8 mg/dL (ref 0.50–1.35)
HCT: 51 % (ref 39.0–52.0)
Potassium: 3.8 mEq/L (ref 3.5–5.1)
Sodium: 139 mEq/L (ref 135–145)
TCO2: 24 mmol/L (ref 0–100)

## 2013-04-10 SURGERY — CARPAL TUNNEL RELEASE
Anesthesia: General | Site: Wrist | Laterality: Right | Wound class: Clean

## 2013-04-10 MED ORDER — OXYCODONE-ACETAMINOPHEN 7.5-325 MG PO TABS: 1.0000 | ORAL_TABLET | ORAL | Status: DC | PRN

## 2013-04-10 MED ORDER — FENTANYL CITRATE 0.05 MG/ML IJ SOLN
INTRAMUSCULAR | Status: DC | PRN
  Administered 2013-04-10: 100 ug via INTRAVENOUS

## 2013-04-10 MED ORDER — HYDROMORPHONE HCL PF 1 MG/ML IJ SOLN: 0.2500 mg | INTRAMUSCULAR | Status: DC | PRN

## 2013-04-10 MED ORDER — METOCLOPRAMIDE HCL 5 MG/ML IJ SOLN: 10.0000 mg | Freq: Once | INTRAMUSCULAR | Status: DC | PRN

## 2013-04-10 MED ORDER — MIDAZOLAM HCL 2 MG/2ML IJ SOLN: 1.0000 mg | INTRAMUSCULAR | Status: DC | PRN

## 2013-04-10 MED ORDER — MIDAZOLAM HCL 5 MG/5ML IJ SOLN
INTRAMUSCULAR | Status: DC | PRN
  Administered 2013-04-10: 2 mg via INTRAVENOUS

## 2013-04-10 MED ORDER — LIDOCAINE HCL 2 % IJ SOLN
INTRAMUSCULAR | Status: DC | PRN
  Administered 2013-04-10: 5 mL

## 2013-04-10 MED ORDER — OXYCODONE HCL 5 MG PO TABS
5.0000 mg | ORAL_TABLET | Freq: Once | ORAL | Status: AC | PRN
  Administered 2013-04-10: 5 mg via ORAL

## 2013-04-10 MED ORDER — OXYCODONE HCL 5 MG/5ML PO SOLN: 5.0000 mg | Freq: Once | ORAL | Status: AC | PRN

## 2013-04-10 MED ORDER — LACTATED RINGERS IV SOLN
INTRAVENOUS | Status: DC
  Administered 2013-04-10: 07:00:00 via INTRAVENOUS

## 2013-04-10 MED ORDER — LIDOCAINE HCL (CARDIAC) 20 MG/ML IV SOLN
INTRAVENOUS | Status: DC | PRN
  Administered 2013-04-10: 50 mg via INTRAVENOUS

## 2013-04-10 MED ORDER — PROPOFOL 10 MG/ML IV BOLUS
INTRAVENOUS | Status: DC | PRN
  Administered 2013-04-10: 200 mg via INTRAVENOUS

## 2013-04-10 MED ORDER — EPHEDRINE SULFATE 50 MG/ML IJ SOLN
INTRAMUSCULAR | Status: DC | PRN
  Administered 2013-04-10 (×2): 10 mg via INTRAVENOUS

## 2013-04-10 MED ORDER — DEXAMETHASONE SODIUM PHOSPHATE 10 MG/ML IJ SOLN
INTRAMUSCULAR | Status: DC | PRN
  Administered 2013-04-10: 4 mg via INTRAVENOUS

## 2013-04-10 MED ORDER — ONDANSETRON HCL 4 MG/2ML IJ SOLN
INTRAMUSCULAR | Status: DC | PRN
  Administered 2013-04-10: 4 mg via INTRAMUSCULAR

## 2013-04-10 MED ORDER — FENTANYL CITRATE 0.05 MG/ML IJ SOLN: 50.0000 ug | INTRAMUSCULAR | Status: DC | PRN

## 2013-04-10 SURGICAL SUPPLY — 45 items
BANDAGE ADHESIVE 1X3 (GAUZE/BANDAGES/DRESSINGS) IMPLANT
BANDAGE ELASTIC 3 VELCRO ST LF (GAUZE/BANDAGES/DRESSINGS) ×1 IMPLANT
BLADE SURG 15 STRL LF DISP TIS (BLADE) ×1 IMPLANT
BLADE SURG 15 STRL SS (BLADE) ×2
BNDG CMPR 9X4 STRL LF SNTH (GAUZE/BANDAGES/DRESSINGS) ×1
BNDG COHESIVE 3X5 TAN STRL LF (GAUZE/BANDAGES/DRESSINGS) ×1 IMPLANT
BNDG ESMARK 4X9 LF (GAUZE/BANDAGES/DRESSINGS) ×1 IMPLANT
BRUSH SCRUB EZ PLAIN DRY (MISCELLANEOUS) ×2 IMPLANT
CLOTH BEACON ORANGE TIMEOUT ST (SAFETY) ×1 IMPLANT
CORDS BIPOLAR (ELECTRODE) ×1 IMPLANT
COVER MAYO STAND STRL (DRAPES) ×2 IMPLANT
COVER TABLE BACK 60X90 (DRAPES) ×2 IMPLANT
CUFF TOURNIQUET SINGLE 18IN (TOURNIQUET CUFF) IMPLANT
CUFF TOURNIQUET SINGLE 24IN (TOURNIQUET CUFF) ×1 IMPLANT
DECANTER SPIKE VIAL GLASS SM (MISCELLANEOUS) IMPLANT
DRAPE EXTREMITY T 121X128X90 (DRAPE) ×2 IMPLANT
DRAPE SURG 17X23 STRL (DRAPES) ×2 IMPLANT
GLOVE BIOGEL M STRL SZ7.5 (GLOVE) ×1 IMPLANT
GLOVE BIOGEL PI IND STRL 7.0 (GLOVE) IMPLANT
GLOVE BIOGEL PI INDICATOR 7.0 (GLOVE) ×1
GLOVE ECLIPSE 6.5 STRL STRAW (GLOVE) ×1 IMPLANT
GLOVE EXAM NITRILE MD LF STRL (GLOVE) ×1 IMPLANT
GLOVE ORTHO TXT STRL SZ7.5 (GLOVE) ×2 IMPLANT
GOWN BRE IMP PREV XXLGXLNG (GOWN DISPOSABLE) ×3 IMPLANT
GOWN PREVENTION PLUS XLARGE (GOWN DISPOSABLE) ×2 IMPLANT
NEEDLE 27GAX1X1/2 (NEEDLE) ×1 IMPLANT
NS IRRIG 1000ML POUR BTL (IV SOLUTION) ×1 IMPLANT
PACK BASIN DAY SURGERY FS (CUSTOM PROCEDURE TRAY) ×2 IMPLANT
PAD CAST 3X4 CTTN HI CHSV (CAST SUPPLIES) ×1 IMPLANT
PADDING CAST ABS 4INX4YD NS (CAST SUPPLIES)
PADDING CAST ABS COTTON 4X4 ST (CAST SUPPLIES) ×1 IMPLANT
PADDING CAST COTTON 3X4 STRL (CAST SUPPLIES) ×2
SPLINT PLASTER CAST XFAST 3X15 (CAST SUPPLIES) ×5 IMPLANT
SPLINT PLASTER XTRA FASTSET 3X (CAST SUPPLIES) ×5
SPONGE GAUZE 4X4 12PLY (GAUZE/BANDAGES/DRESSINGS) ×1 IMPLANT
STOCKINETTE 4X48 STRL (DRAPES) ×2 IMPLANT
STRIP CLOSURE SKIN 1/2X4 (GAUZE/BANDAGES/DRESSINGS) ×2 IMPLANT
SUT PROLENE 3 0 PS 2 (SUTURE) ×2 IMPLANT
SUT VICRYL 3-0 RB1 (SUTURE) ×1 IMPLANT
SYR 3ML 23GX1 SAFETY (SYRINGE) IMPLANT
SYR BULB 3OZ (MISCELLANEOUS) ×1 IMPLANT
SYR CONTROL 10ML LL (SYRINGE) ×1 IMPLANT
TOWEL OR 17X24 6PK STRL BLUE (TOWEL DISPOSABLE) ×2 IMPLANT
TRAY DSU PREP LF (CUSTOM PROCEDURE TRAY) ×2 IMPLANT
UNDERPAD 30X30 INCONTINENT (UNDERPADS AND DIAPERS) ×2 IMPLANT

## 2013-04-10 NOTE — Op Note (Signed)
[id number]  

## 2013-04-10 NOTE — Anesthesia Postprocedure Evaluation (Signed)
Anesthesia Post Note  Patient: [name]  Procedure(s) Performed: Procedure(s) (LRB): REVISION OF CARPAL TUNNEL RELEASE LIGAMENT RIGHT WRIST (Right)  Anesthesia type: General  Patient location: PACU  Post pain: Pain level controlled  Post assessment: Patient's Cardiovascular Status Stable  Last Vitals:  Filed Vitals:   04/10/13 0930  BP: 103/57  Pulse: 53  Temp:   Resp: 14    Post vital signs: Reviewed and stable  Level of consciousness: alert  Complications: No apparent anesthesia complications

## 2013-04-10 NOTE — Transfer of Care (Signed)
Immediate Anesthesia Transfer of Care Note  Patient: [name]  Procedure(s) Performed: Procedure(s): REVISION OF CARPAL TUNNEL RELEASE LIGAMENT RIGHT WRIST (Right)  Patient Location: PACU  Anesthesia Type:General  Level of Consciousness: sedated  Airway & Oxygen Therapy: Patient Spontanous Breathing and Patient connected to face mask oxygen  Post-op Assessment: Report given to PACU RN and Post -op Vital signs reviewed and stable  Post vital signs: Reviewed and stable  Complications: No apparent anesthesia complications

## 2013-04-10 NOTE — Anesthesia Procedure Notes (Signed)
Procedure Name: LMA Insertion Performed by: Lance Coon Pre-anesthesia Checklist: Patient identified, Emergency Drugs available, Suction available and Patient being monitored Patient Re-evaluated:Patient Re-evaluated prior to inductionOxygen Delivery Method: Circle System Utilized Preoxygenation: Pre-oxygenation with 100% oxygen Intubation Type: IV induction Ventilation: Mask ventilation without difficulty LMA: LMA with gastric port inserted LMA Size: 5.0 Number of attempts: 1 Placement Confirmation: positive ETCO2 Tube secured with: Tape Dental Injury: Teeth and Oropharynx as per pre-operative assessment

## 2013-04-10 NOTE — Anesthesia Preprocedure Evaluation (Signed)
Anesthesia Evaluation  Patient identified by MRN, date of birth, ID band Patient awake    Reviewed: Allergy & Precautions, H&P , NPO status , Patient's Chart, lab work & pertinent test results, reviewed documented beta blocker date and time   Airway Mallampati: II TM Distance: >3 FB Neck ROM: full    Dental   Pulmonary sleep apnea ,  breath sounds clear to auscultation        Cardiovascular hypertension, On Medications + CAD, + Past MI and + Peripheral Vascular Disease Rhythm:regular     Neuro/Psych negative neurological ROS  negative psych ROS   GI/Hepatic Neg liver ROS, hiatal hernia,   Endo/Other  negative endocrine ROS  Renal/GU negative Renal ROS  negative genitourinary   Musculoskeletal   Abdominal   Peds  Hematology negative hematology ROS (+)   Anesthesia Other Findings See surgeon's H&P   Reproductive/Obstetrics negative OB ROS                           Anesthesia Physical Anesthesia Plan  ASA: III  Anesthesia Plan: General   Post-op Pain Management:    Induction: Intravenous  Airway Management Planned: LMA  Additional Equipment:   Intra-op Plan:   Post-operative Plan:   Informed Consent: I have reviewed the patients History and Physical, chart, labs and discussed the procedure including the risks, benefits and alternatives for the proposed anesthesia with the patient or authorized representative who has indicated his/her understanding and acceptance.   Dental Advisory Given  Plan Discussed with: CRNA and Surgeon  Anesthesia Plan Comments:         Anesthesia Quick Evaluation

## 2013-04-10 NOTE — Brief Op Note (Signed)
04/10/2013  8:47 AM  PATIENT:  Rosita Kea Bohlen  62 y.o. male  PRE-OPERATIVE DIAGNOSIS:  SLAC WRIST  POST-OPERATIVE DIAGNOSIS:  SLAC WRIST, RECURRENT CARPAL TUNNEL SYNDROME  PROCEDURE:  EXPLORATION OF RIGHT CARPAL TUNNEL, LIGAMENT PLASTY AND HYPOTHENAR FAT PAD TRANSFER AND NEUROLYSIS OF MEDIAN NERVE  SURGEON:  Surgeon(s) and Role:    * Wyn Forster., MD - Primary  PHYSICIAN ASSISTANT:   ASSISTANTS: SURGICAL TECHNICIAN     ANESTHESIA:   general  EBL:     BLOOD ADMINISTERED:none  DRAINS: none   LOCAL MEDICATIONS USED:  LIDOCAINE   SPECIMEN:  No Specimen  DISPOSITION OF SPECIMEN:  N/A  COUNTS:  YES  TOURNIQUET:   Total Tourniquet Time Documented: Upper Arm (Right) - 36 minutes Total: Upper Arm (Right) - 36 minutes   DICTATION: .Other Dictation: Dictation Number [id number]  PLAN OF CARE: Discharge to home after PACU  PATIENT DISPOSITION:  PACU - hemodynamically stable.   Delay start of Pharmacological VTE agent (>24hrs) due to surgical blood loss or risk of bleeding: not applicable

## 2013-04-11 ENCOUNTER — Encounter (HOSPITAL_BASED_OUTPATIENT_CLINIC_OR_DEPARTMENT_OTHER): Payer: Self-pay | Admitting: Orthopedic Surgery

## 2013-04-11 NOTE — Op Note (Signed)
[name], [name]              ACCOUNT NO.:  [id number]  MEDICAL RECORD NO.:  [id number]  LOCATION:                               FACILITY:  [facility]  PHYSICIAN:  Katy Fitch. Hattie Aguinaldo, M.D. DATE OF BIRTH:  May 19, 1963  DATE OF PROCEDURE:  04/10/2013 DATE OF DISCHARGE:  04/10/2013                              OPERATIVE REPORT   PREOPERATIVE DIAGNOSIS:  Recurrent carpal tunnel syndrome, 22 months status post prior traditional carpal tunnel release with background scapholunate advance collapse instability of right wrist, also status post MRI documenting prominence of the lunate and volar osteophyte developing on radius, likely causing recurrent entrapment neuropathy at right carpal tunnel.  POSTOPERATIVE DIAGNOSIS:  Confirmation of prominent osteophyte at the floor of carpal canal due to scapholunate advanced collapse and volar osteophyte forming on distal radius.  OPERATION:  Re-exploration of right carpal canal followed by step-cut lengthening plasty of transverse carpal ligament and hypothenar fat pad transfer directly to ulnar bursa in the end superficial to the median nerve.  OPERATING SURGEON:  Katy Fitch. Babette Stum, MD  ASSISTANT:  Surgical technician.  ANESTHESIA:  General by LMA.  SUPERVISING ANESTHESIOLOGIST:  Janetta Hora. Gelene Mink, M.D.  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  He presented for evaluation of bilateral hand numbness in 2012. Electrodiagnostic studies revealed evidence of right ulnar entrapment neuropathy at the elbow and right median entrapment neuropathy at the wrist.  Plain films of his wrist documented some signs of a chronic scapholunate instability.  We advised simple decompression of his ulnar nerve and simple decompression the median nerve.  Surgery was accomplished in December 2012.  [name] did well for more than a year, but then began to develop recurrent  numbness in his right hand.  He subsequently had left carpal tunnel release surgery which has been successful long term.  He returned to the office in the summer of 2014, and we noted signs of recurrent carpal tunnel syndrome on the right.  In September 2014, he had a repeat electrodiagnostic studies that revealed persistent slowing of his ulnar nerve across the elbow that was not associated with numbness and actual deterioration of the parameters of his conduction of the median nerve on the right.  His left median nerve conduction parameters had normalized.  We repeated x-rays of his wrist and noted that he had a progressive collapse of his lunate into a dorsal flex posture.  He was also developing a volar osteophyte on the radius, therefore an MRI was obtained, which confirmed a prominent osteophyte and swelling of the ulnar bursa and abnormal signal in the median nerve, suggesting recurrent compression.  We advised [name] to undergo reexploration, anticipating possible resection of the osteophyte, however, this is a rather complex procedure due to the volar radiocarpal ligaments and anticipating a ligament plasty to prevent recurrent pressure on the median nerve.  After informed consent, he was brought to the operating room at this time.  Preoperatively, we re-interviewed in the holding area and he confirmed that he is not having numbness in small finger. He is known to have trigger fingers on the left hand which will not be  addressed today.  There was no sign of stenosing tenosynovitis in the right hand.  We told him that we still had some abnormal conduction parameters across the elbow.  In view of the fact that he is not having numbness, we will not address the ulnar nerve on the right at this time.  DESCRIPTION OF PROCEDURE:  [name] was brought to room 1 of the [facility] and placed in the supine position on the operating table.  Following detailed  anesthesia informed consent by Dr. Justin Mend, general anesthesia by LMA technique was recommended and accepted.  In room 1 under Dr. Thornton Dales direct supervision, general anesthesia by LMA technique was induced followed by routine Betadine scrub and paint of the right upper extremity.  A pneumatic tourniquet was applied to the proximal right brachium.  Following routine surgical time-out, the right arm and hand were exsanguinated with Esmarch bandage and an arterial tourniquet upon the proximal brachium inflated to 220 mmHg.  Procedure commenced with a traditional carpal tunnel incision paralleling the thenar crease.  This extended across the distal wrist flexion creases with a Brunner zigzag technique and into the distal forearm for 4 cm.  There was noted to be dense scar directly at the distal palmar crease.  We identified the median nerve and ulnar bursa proximally in the forearm and distally in the palm.  There was a very thick hypertrophic scar that had reformed the transverse carpal ligament, causing very significant compression directly at the distal wrist flexion crease.  With care taking a Penfield 4 elevator passing through the canal surrounding the contents, we performed a step cut lengthening of the transverse carpal ligament and elevated a hypothenar fat pad, approximately 3 cm in width and 3 cm in length.  We subsequently examined the contents of the carpal canal and noted hypertrophic ulnar bursa due to the compression point and a cm long area marked rubor in the median nerve, directly proximal to the transverse carpal ligament scar that had been released, consistent with pseudoneuroma.  I performed a very limited external neurolysis of the median nerve.  The osteophyte at the distal radius was prominent, however, given the complexity of removing an osteophyte in this region and the possibility of bleeding late, as [name] is on Plavix for background  cardiac disease, I elected not to perform a removal of the osteophyte.  We did a step-cut lengthening of the ligament and placed a hypothenar fat pad directly on the ulnar bursa and median nerve below the ligament.  This allowed closure of the wound in layers with 3-0 Vicryl taking care to admit a hemostat in the region of the carpal canal after the closure was completed.  The carpal canal was definitively decompressed.  The wound was then repaired with intradermal 3-0 Prolene segmental sutures and Steri-Strips.  A 2% lidocaine was infiltrated for postoperative analgesia.  For aftercare, [name] was provided a prescription for Percocet 7.5 mg 1 p.o. q.4-6 hours p.r.n. pain, 30 tablets without refill.  I will see him back in followup in the office in a week.     Katy Fitch Kimberly Coye, M.D.   ______________________________ Katy Fitch. Alonie Gazzola, M.D.    RVS/MEDQ  D:  04/10/2013  T:  04/11/2013  Job:  [id number]

## 2013-05-08 ENCOUNTER — Other Ambulatory Visit: Payer: Self-pay

## 2013-05-15 ENCOUNTER — Ambulatory Visit (INDEPENDENT_AMBULATORY_CARE_PROVIDER_SITE_OTHER): Payer: BC Managed Care – PPO | Admitting: Physician Assistant

## 2013-05-15 ENCOUNTER — Encounter (INDEPENDENT_AMBULATORY_CARE_PROVIDER_SITE_OTHER): Payer: Self-pay

## 2013-05-15 DIAGNOSIS — Z9884 Bariatric surgery status: Secondary | ICD-10-CM

## 2013-05-15 NOTE — Patient Instructions (Signed)
Return in one month. Focus on good food choices as well as physical activity. Return sooner if you have an increase in hunger, portion sizes or weight. Return also for difficulty swallowing, night cough, reflux.   

## 2013-05-15 NOTE — Progress Notes (Signed)
  HISTORY: [name] is a 62 y.o.male who received an AP-Standard lap-band in March 2014 by Dr. Ezzard Standing. He comes in today with stable weight since his last visit in September. He doesn't complain of hunger or large portions but does have occasional issues with food getting stuck. He says once this clears, he's able to eat. He reports that this happens most commonly with drier, fibrous foods and eggs. Other lean proteins like seafood and moist chicken go down fine. He believes beer is another contributing factor to his lack of weight loss. He reports having up to five in one day.  VITAL SIGNS: Filed Vitals:   05/15/13 1038  BP: 160/88  Pulse: 60  Temp: 97.6 F (36.4 C)  Resp: 16    PHYSICAL EXAM: Physical exam reveals a very well-appearing 62 y.o.male in no apparent distress Neurologic: Awake, alert, oriented Psych: Bright affect, conversant Respiratory: Breathing even and unlabored. No stridor or wheezing Extremities: Atraumatic, good range of motion. Skin: Warm, Dry, no rashes Musculoskeletal: Normal gait, Joints normal  ASSESMENT: 62 y.o.  male  s/p AP-Standard lap-band.   PLAN: We deferred a fill today as he's borderline green/red zone. It's likely that he's still figuring out which foods are tolerable. We talked about avoiding fibrous, dry foods and excessive alcohol. He voiced understanding and agreement. We'll have him back in one month or sooner if needed.

## 2013-06-05 ENCOUNTER — Encounter (INDEPENDENT_AMBULATORY_CARE_PROVIDER_SITE_OTHER): Payer: BC Managed Care – PPO

## 2013-06-19 ENCOUNTER — Encounter (INDEPENDENT_AMBULATORY_CARE_PROVIDER_SITE_OTHER): Payer: Self-pay | Admitting: Physician Assistant

## 2013-06-19 ENCOUNTER — Ambulatory Visit (INDEPENDENT_AMBULATORY_CARE_PROVIDER_SITE_OTHER): Payer: BC Managed Care – PPO | Admitting: Physician Assistant

## 2013-06-19 VITALS — BP 132/80 | HR 60 | Temp 98.0°F | Resp 18 | Ht 68.5 in | Wt 262.6 lb

## 2013-06-19 DIAGNOSIS — Z4651 Encounter for fitting and adjustment of gastric lap band: Secondary | ICD-10-CM

## 2013-06-19 NOTE — Progress Notes (Signed)
  HISTORY: [name] is a 62 y.o.male who received an AP-Standard lap-band in March 2014 by Dr. Ezzard Standing. He comes in with 3 lbs weight loss since his last visit. He has cut down some on his beer intake, but he has been exercising less. He denies regurgitation or reflux. He reports feeling somewhat full after a couple of bites of food but this resolves after a few moments and he's able to eat more food. He denies night cough.  VITAL SIGNS: Filed Vitals:   06/19/13 0917  BP: 132/80  Pulse: 60  Temp: 98 F (36.7 C)  Resp: 18    PHYSICAL EXAM: Physical exam reveals a very well-appearing 62 y.o.male in no apparent distress Neurologic: Awake, alert, oriented Psych: Bright affect, conversant Respiratory: Breathing even and unlabored. No stridor or wheezing Abdomen: Soft, nontender, nondistended to palpation. Incisions well-healed. No incisional hernias. Port easily palpated. Extremities: Atraumatic, good range of motion.  ASSESMENT: 62 y.o.  male  s/p AP-Standard lap-band.   PLAN: The patient's port was accessed with a 20G Huber needle without difficulty. Clear fluid was aspirated and 0.25 mL saline was added to the port to give a total predicted volume of 2.95 mL. The patient was able to swallow water without difficulty following the procedure and was instructed to take clear liquids for the next 24-48 hours and advance slowly as tolerated. We discussed eating small bites that are adequately chewed to potentially help with this early fullness he gets. I also encouraged him to increase his physical activity. We'll have him back in a month.

## 2013-06-19 NOTE — Patient Instructions (Signed)

## 2013-07-24 ENCOUNTER — Encounter (INDEPENDENT_AMBULATORY_CARE_PROVIDER_SITE_OTHER): Payer: Self-pay

## 2013-07-24 ENCOUNTER — Ambulatory Visit (INDEPENDENT_AMBULATORY_CARE_PROVIDER_SITE_OTHER): Payer: BC Managed Care – PPO | Admitting: Physician Assistant

## 2013-07-24 VITALS — BP 123/71 | HR 78 | Temp 98.6°F | Resp 16 | Ht 68.5 in | Wt 262.2 lb

## 2013-07-24 DIAGNOSIS — Z9884 Bariatric surgery status: Secondary | ICD-10-CM

## 2013-07-24 NOTE — Progress Notes (Signed)
  HISTORY: [name] is a 63 y.o.male who received an AP-Standard lap-band in March 2014 by Dr. Lucia Gaskins. His weight has been stable in the past month. He reports having lost close to 10 lbs but seems to have gained it back. He reports eating small portions and his hunger remains under excellent control. He admits to grazing at home however. He's there more often now that he's between work projects. He believes the grazing is confounding his weight loss. He's walking regularly and intends to increase frequency. He has had no alcohol since Jan 1.  VITAL SIGNS: Filed Vitals:   07/24/13 1058  BP: 123/71  Pulse: 78  Temp: 98.6 F (37 C)  Resp: 16    PHYSICAL EXAM: Physical exam reveals a very well-appearing 62 y.o.male in no apparent distress Neurologic: Awake, alert, oriented Psych: Bright affect, conversant Respiratory: Breathing even and unlabored. No stridor or wheezing Extremities: Atraumatic, good range of motion. Skin: Warm, Dry, no rashes Musculoskeletal: Normal gait, Joints normal  ASSESMENT: 63 y.o.  male  s/p AP-Standard lap-band.   PLAN: [name] doesn't want a fill today as he believes he'd become obstructed as a result. I agree. He is going to work on avoiding grazing while increasing his exercise. We'll have him back in one month or sooner if needed.

## 2013-07-24 NOTE — Patient Instructions (Signed)
Return in one month. Focus on good food choices as well as physical activity. Return sooner if you have an increase in hunger, portion sizes or weight. Return also for difficulty swallowing, night cough, reflux.

## 2013-08-21 ENCOUNTER — Encounter (INDEPENDENT_AMBULATORY_CARE_PROVIDER_SITE_OTHER): Payer: BC Managed Care – PPO

## 2013-09-04 ENCOUNTER — Ambulatory Visit (INDEPENDENT_AMBULATORY_CARE_PROVIDER_SITE_OTHER): Payer: BC Managed Care – PPO | Admitting: Physician Assistant

## 2013-09-04 ENCOUNTER — Encounter (INDEPENDENT_AMBULATORY_CARE_PROVIDER_SITE_OTHER): Payer: Self-pay | Admitting: Physician Assistant

## 2013-09-04 VITALS — BP 132/80 | HR 76 | Temp 97.6°F | Resp 18 | Ht 68.5 in | Wt 264.2 lb

## 2013-09-04 DIAGNOSIS — Z4651 Encounter for fitting and adjustment of gastric lap band: Secondary | ICD-10-CM

## 2013-09-04 NOTE — Patient Instructions (Signed)

## 2013-09-04 NOTE — Progress Notes (Signed)
  HISTORY: [name] is a 63 y.o.male who received an AP-Standard lap-band in March 2014 by Dr. Lucia Gaskins. He comes in with relatively stable weight since his last visit. His total weight loss in the year since surgery is 20 lbs. He has worked on avoiding grazing but he believes his portion sizes may be too large. When he's away from home, he is able to avoid snacking. His evening meal portions sound a bit larger than desired. He describes having occasional obstructive feelings after 4-5 bites of food.  VITAL SIGNS: Filed Vitals:   09/04/13 1108  BP: 132/80  Pulse: 76  Temp: 97.6 F (36.4 C)  Resp: 18    PHYSICAL EXAM: Physical exam reveals a very well-appearing 62 y.o.male in no apparent distress Neurologic: Awake, alert, oriented Psych: Bright affect, conversant Respiratory: Breathing even and unlabored. No stridor or wheezing Abdomen: Soft, nontender, nondistended to palpation. Incisions well-healed. No incisional hernias. Port easily palpated. Extremities: Atraumatic, good range of motion.  ASSESMENT: 63 y.o.  male  s/p AP-Standard lap-band.   PLAN: The patient's port was accessed with a 20G Huber needle without difficulty. Clear fluid was aspirated and 0.25 mL saline was added to the port to give a total predicted volume of 3.2 mL. The patient was able to swallow water without difficulty following the procedure and was instructed to take clear liquids for the next 24-48 hours and advance slowly as tolerated. We discussed eating behaviors: taking very small bites, chewing adequately, placing utensils on the table and hands in his lap until that bolus of food passes, then going on to the next bite. We also talked about ways to moisten drier foods like chicken breast with low-fat things like salsa. He voiced understanding. We'll have him back in two months or sooner if needed.

## 2013-11-06 ENCOUNTER — Encounter (INDEPENDENT_AMBULATORY_CARE_PROVIDER_SITE_OTHER): Payer: BC Managed Care – PPO

## 2013-11-13 ENCOUNTER — Ambulatory Visit (INDEPENDENT_AMBULATORY_CARE_PROVIDER_SITE_OTHER): Payer: BC Managed Care – PPO | Admitting: Physician Assistant

## 2013-11-13 ENCOUNTER — Encounter (INDEPENDENT_AMBULATORY_CARE_PROVIDER_SITE_OTHER): Payer: Self-pay

## 2013-11-13 VITALS — BP 134/82 | HR 48 | Temp 97.8°F | Ht 68.0 in | Wt 248.0 lb

## 2013-11-13 DIAGNOSIS — Z9884 Bariatric surgery status: Secondary | ICD-10-CM

## 2013-11-13 NOTE — Progress Notes (Signed)
  HISTORY: [name] is a 63 y.o.male who received an AP-Standard lap-band in March 2014 by Dr. Lucia Gaskins. He comes in with 13 lbs weight loss since his last visit two months ago. He describes following his intake closely and increasing his physical activity. He says his last band fill was very helpful in reducing his portion sizes and keeping his hunger under good control. He has regurgitation only when he eats too quickly or doesn't chew adequately.  VITAL SIGNS: Filed Vitals:   11/13/13 1051  BP: 134/82  Pulse: 48  Temp: 97.8 F (36.6 C)    PHYSICAL EXAM: Physical exam reveals a very well-appearing 62 y.o.male in no apparent distress Neurologic: Awake, alert, oriented Psych: Bright affect, conversant Respiratory: Breathing even and unlabored. No stridor or wheezing Extremities: Atraumatic, good range of motion. Skin: Warm, Dry, no rashes Musculoskeletal: Normal gait, Joints normal  ASSESMENT: 63 y.o.  male  s/p AP-standard lap-band.   PLAN: He is very pleased with his level of restriction and rate of weight loss. He is worried that a fill might over-restrict him and cause regurgitation. It sounds like he's in the green zone. i encouraged him to continue his current regimen and to return to see [place] in two months. I asked him to call us if he had increasing hunger or weight or any symptoms of obstruction.

## 2013-11-13 NOTE — Patient Instructions (Signed)
Return in two months. Focus on good food choices as well as physical activity. Return sooner if you have an increase in hunger, portion sizes or weight. Return also for difficulty swallowing, night cough, reflux.   

## 2013-12-15 ENCOUNTER — Telehealth: Payer: Self-pay

## 2013-12-15 ENCOUNTER — Telehealth: Payer: Self-pay | Admitting: *Deleted

## 2013-12-15 NOTE — Telephone Encounter (Signed)
Dr Nadara Mustard is wanting to apeak with Dr Domenic Polite about patient. 12/15/13 pt had substernal chest pain, last stress test done 06/2012.  Dr Nadara Mustard is aware that Dr Domenic Polite is gone for the day and is okay with waiting on call for tomorrow.

## 2013-12-15 NOTE — Telephone Encounter (Signed)
I spoke with patient at home after apt with Dr.Howard,pcp.Patient mentioned at scheduled office visit today that he had three episodes of chest tightness as he walked up a hill during his walk this am.This occurred three times as he walked up hills,tightness resolved when he was walking on flat.He did not use his NTG I  have asked Dr.Howards office [phone] to fax MD note and his current labs to [place] office so I will have available for Dr.McDowell tomorrow.   I spoke with Dr.Ross our DOD this afternoon and she reccommended I get records to have available for Dr.McDowells's review and have asked patient to limit his activity today.

## 2013-12-15 NOTE — Telephone Encounter (Signed)
Will forward for Dr Domenic Polite.

## 2013-12-16 ENCOUNTER — Encounter: Payer: Self-pay | Admitting: Cardiology

## 2013-12-23 ENCOUNTER — Ambulatory Visit (INDEPENDENT_AMBULATORY_CARE_PROVIDER_SITE_OTHER): Payer: BC Managed Care – PPO | Admitting: Cardiology

## 2013-12-23 ENCOUNTER — Telehealth: Payer: Self-pay | Admitting: Cardiology

## 2013-12-23 ENCOUNTER — Encounter: Payer: Self-pay | Admitting: *Deleted

## 2013-12-23 ENCOUNTER — Encounter: Payer: Self-pay | Admitting: Cardiology

## 2013-12-23 VITALS — BP 132/81 | HR 70 | Ht 67.0 in | Wt 242.0 lb

## 2013-12-23 DIAGNOSIS — I2510 Atherosclerotic heart disease of native coronary artery without angina pectoris: Secondary | ICD-10-CM

## 2013-12-23 DIAGNOSIS — E785 Hyperlipidemia, unspecified: Secondary | ICD-10-CM

## 2013-12-23 DIAGNOSIS — I25119 Atherosclerotic heart disease of native coronary artery with unspecified angina pectoris: Secondary | ICD-10-CM

## 2013-12-23 DIAGNOSIS — I10 Essential (primary) hypertension: Secondary | ICD-10-CM

## 2013-12-23 DIAGNOSIS — I209 Angina pectoris, unspecified: Secondary | ICD-10-CM

## 2013-12-23 MED ORDER — ATORVASTATIN CALCIUM 20 MG PO TABS: 20.0000 mg | ORAL_TABLET | Freq: Every day | ORAL | Status: DC

## 2013-12-23 MED ORDER — AMLODIPINE BESYLATE 5 MG PO TABS: 5.0000 mg | ORAL_TABLET | Freq: Every day | ORAL | Status: DC

## 2013-12-23 NOTE — Telephone Encounter (Signed)
Left heart cath - Friday, 6/26 - 7:30 - Ellyn Hack

## 2013-12-23 NOTE — Patient Instructions (Signed)
   Increase Lipitor to 20mg  daily   Begin Norvasc 5mg  daily   Above medications sent to [facility] Continue all other medications.   Your physician has requested that you have a cardiac catheterization. Cardiac catheterization is used to diagnose and/or treat various heart conditions. Doctors may recommend this procedure for a number of different reasons. The most common reason is to evaluate chest pain. Chest pain can be a symptom of coronary artery disease (CAD), and cardiac catheterization can show whether plaque is narrowing or blocking your heart's arteries. This procedure is also used to evaluate the valves, as well as measure the blood flow and oxygen levels in different parts of your heart. For further information please visit [email or web address]. Please follow instruction sheet, as given. Follow up will be given at time of discharge from above

## 2013-12-23 NOTE — Progress Notes (Signed)
Clinical Summary [name] is a 63 y.o.male last seen by Dr Domenic Polite in 2013, this is our first visit together. He is seen for the following medical problems.  1. CAD - committed to lifelong plavix by previous cardiolgist per notes given his history of late stent thrombosis in 2005 - long history of coronary interventions, last cath in our records appears to be in 2005. - cath 2005 with LM normal, LAD with 3 overlapping stents beginning in proximal vessel with instent thrombus, LCX patent, RCA patent with a 20% narrowing at stent site in mid vessel. LV gram 40%. He had aspiration thrombectomy and PTCA of stent thrombosis in prox LAD.  - 05/2012 MPI fixed defect in the basal inferoseptum, mid inferior, apical inferior thought to be subdiaphragmatic attenuation, low risk study  - reports approx 2-3 months of intermittent 4/10 chest tightness.. No associated symptoms. Reports last Monday while walking, after getting to incline feeling of similar tightness. On level ground symptoms would resolved, however returned again on another incline. Resolved quickly with rest. Similar episode while walking same path one week later, with significant symptoms with walking up incline. - no significant SOB or DOE, no orthopnea, no PND - compliant with meds  2. HTN - checks at home occasionally, typically 130/80s  3. Hyperlipidemia - compliant with lipitor - 12/08/13: TC 172 TG 86 HDL 57 LDL 98    Past Medical History  Diagnosis Date  . Coronary atherosclerosis of native coronary artery     Multiple stents - RCA/LAD/diagonal, overlapping DES LAD, stent thrombosis 2005  . Essential hypertension, benign   . Peripheral vascular disease   . Myocardial infarction     Anterior with LAD stent thrombosis 2005  . Arthritis   . Hyperlipemia   . History of GI bleed     On nonsteroidals  . Aortic root dilatation     41 mm by CT 2008, 36 mm by echo 2011  . Obesity   . H/O hiatal hernia   . Sleep apnea      CPAP daily     Allergies  Allergen Reactions  . Chlorhexidine Itching    Pt. States on last admission CHG wipes made him itch and it had to be washed off.  Uncertain if benadryl was given.     Current Outpatient Prescriptions  Medication Sig Dispense Refill  . aspirin 325 MG tablet Take 325 mg by mouth daily.       Marland Kitchen atorvastatin (LIPITOR) 20 MG tablet Take 10 mg by mouth every evening.       Hinda Kehr 30 MG/ACT SOLN Apply 1 Act topically daily. Apply to each armpit daily      . cholecalciferol (VITAMIN D) 1000 UNITS tablet Take 1,000 Units by mouth daily.      . clopidogrel (PLAVIX) 75 MG tablet Take 75 mg by mouth every morning.       . Coenzyme Q10 (CO Q-10) 200 MG CAPS Take 1 capsule by mouth daily.      . cyclobenzaprine (FLEXERIL) 10 MG tablet Take 10 mg by mouth 3 (three) times daily as needed for muscle spasms.      . fish oil-omega-3 fatty acids 1000 MG capsule Take 1 g by mouth 2 (two) times a week. Takes maybe one per week      . losartan-hydrochlorothiazide (HYZAAR) 100-25 MG per tablet Take 1 tablet by mouth every morning.       . metoprolol (LOPRESSOR) 50 MG tablet Take 50  mg by mouth 2 (two) times daily.       . naproxen (NAPROSYN) 500 MG tablet Take 500 mg by mouth 2 (two) times daily with a meal.       . nitroGLYCERIN (NITROSTAT) 0.4 MG SL tablet Place 0.4 mg under the tongue every 5 (five) minutes as needed for chest pain.       . Pomegranate, Punica granatum, (POMEGRANATE PO) Take 435 mg by mouth daily.      . tadalafil (CIALIS) 20 MG tablet Take 20 mg by mouth daily as needed.       . TURMERIC PO Take 600 mg by mouth 2 (two) times daily.       No current facility-administered medications for this visit.     Past Surgical History  Procedure Laterality Date  . Appendectomy    . Cholecystectomy    . Total knee arthroplasty Right 2006  . Total knee arthroplasty Left 2007  . Elbow arthroscopy Right   . Shoulder arthroscopy Right 4/11  . Cervical fusion  9/10   . Coronary angioplasty with stent placement  0981,1914  . Carpal tunnel release Right 6/12  . Carpal tunnel release  06/06/2011    Procedure: CARPAL TUNNEL RELEASE;  Surgeon: Cammie Sickle., MD;  Location: [facility];  Service: Orthopedics;  Laterality: Right;  . Ulnar nerve transposition  06/06/2011    Procedure: ULNAR NERVE DECOMPRESSION/TRANSPOSITION;  Surgeon: Cammie Sickle., MD;  Location: [facility];  Service: Orthopedics;  Laterality: Right;  decompression ulnar nerve right cubital tunnel   . Breath tek h pylori  06/11/2012    Procedure: BREATH TEK H PYLORI;  Surgeon: Shann Medal, MD;  Location: [facility];  Service: General;  Laterality: N/A;  Seth Bake  . Laparoscopic gastric banding  3/14  . Carpal tunnel release Right 04/10/2013    Procedure: REVISION OF CARPAL TUNNEL RELEASE LIGAMENT RIGHT WRIST;  Surgeon: Cammie Sickle., MD;  Location: [facility] [facility];  Service: Orthopedics;  Laterality: Right;     Allergies  Allergen Reactions  . Chlorhexidine Itching    Pt. States on last admission CHG wipes made him itch and it had to be washed off.  Uncertain if benadryl was given.      Family History  Problem Relation Age of Onset  . Colon cancer Father   . Colon cancer Maternal Grandmother   . Obesity Mother   . Diabetes Maternal Grandfather      Social History [name] reports that he has never smoked. He quit smokeless tobacco use about 10 years ago. [name] reports that he drinks alcohol.   Review of Systems CONSTITUTIONAL: No weight loss, fever, chills, weakness or fatigue.  HEENT: Eyes: No visual loss, blurred vision, double vision or yellow sclerae.No hearing loss, sneezing, congestion, runny nose or sore throat.  SKIN: No rash or itching.  CARDIOVASCULAR: per HPI RESPIRATORY: No shortness of breath, cough or sputum.  GASTROINTESTINAL: No anorexia, nausea, vomiting or diarrhea. No abdominal pain or blood.   GENITOURINARY: No burning on urination, no polyuria NEUROLOGICAL: No headache, dizziness, syncope, paralysis, ataxia, numbness or tingling in the extremities. No change in bowel or bladder control.  MUSCULOSKELETAL: No muscle, back pain, joint pain or stiffness.  LYMPHATICS: No enlarged nodes. No history of splenectomy.  PSYCHIATRIC: No history of depression or anxiety.  ENDOCRINOLOGIC: No reports of sweating, cold or heat intolerance. No polyuria or polydipsia.  Marland Kitchen   Physical Examination There were  no vitals filed for this visit. There were no vitals filed for this visit.  Gen: resting comfortably, no acute distress HEENT: no scleral icterus, pupils equal round and reactive, no palptable cervical adenopathy,  CV Resp: Clear to auscultation bilaterally GI: abdomen is soft, non-tender, non-distended, normal bowel sounds, no hepatosplenomegaly MSK: extremities are warm, no edema.  Skin: warm, no rash Neuro:  no focal deficits Psych: appropriate affect   Diagnostic Studies 02/2004 Cath RESULTS: Left main coronary artery: Free of significant disease.  Left anterior descending artery: Gave rise to two diagonal branches and a  large septal perforator. There were three overlapping stents beginning in  the proximal LAD just after the first diagonal Ameria Sanjurjo and extending to the  mid LAD. Within the most proximal stent there was a filling defect which is  felt to represent a thrombus and there was moderate narrowing about a third  the way down where the septal diagonal arose. There was about 40% narrowing  at the ostium of the diagonal Holger Sokolowski which had been recently dilated in  February.  Circumflex artery: Gave rise to a large marginal Linnea Todisco and a large  posterolateral Alphons Burgert. These vessels were free of significant disease.  Right coronary artery: Moderate sized vessel. Gave rise to a conus Emmary Culbreath,  a right ventricular Moris Ratchford, a posterior descending Shakesha Soltau, and a small  posterolateral  Zamia Tyminski. There was less than 20% narrowing at the stent site  in the mid right coronary artery.  LEFT VENTRICULOGRAM: The left ventriculogram performed in the RAO  projection showed akinesis of the apex. The anterolateral moved well and  the inferior wall moved well. The estimated ejection fraction was 40%.  This wall motion abnormality was new from the prior study.  Following aspiration thrombectomy and balloon dilatation of the stent in the  proximal LAD the thrombus resolved and the stent diameter improved and the  flow improved from TIMI 2 to TIMI 3 flow.  CONCLUSIONS:  1. Acute anterior wall myocardial infarction due to stent thrombosis within  the stent in the proximal left anterior descending with 50% stenosis  within the stent in the proximal left anterior descending with thrombus,  40% narrowing in the diagonal Maryln Eastham of the left anterior descending, no  major obstruction of circumflex artery, less than 20% narrowing at the  stent in the right coronary artery, and apical wall akinesis.  2. Successful aspiration thrombectomy and PTCA for stent thrombosis in the  proximal left anterior descending with improvement in center of narrowing  from 50% to 0% and improvement in the flow from TIMI 2 to TIMI 3 flow.  DISPOSITION: The patient returned to postangiographic suite for further  observation.     Assessment and Plan  1. Chest pain - symptoms concerning for angina, high pretest probaility given his history and description of symptoms. Will add norvasc as additional antianginal. Refer for cath for evaluation of coronaries.   2. HTN - at goal, continue current meds  3. Hyperlipidemia - will increase lipitor to 20mg  daily, he is not in favor of high dose statin at this time   F/u after cath  Arnoldo Lenis, M.D., F.A.C.C.

## 2013-12-24 NOTE — Telephone Encounter (Signed)
No precert required 

## 2013-12-25 ENCOUNTER — Encounter (HOSPITAL_COMMUNITY): Payer: Self-pay | Admitting: Pharmacy Technician

## 2013-12-25 ENCOUNTER — Other Ambulatory Visit: Payer: Self-pay | Admitting: Cardiology

## 2013-12-26 ENCOUNTER — Other Ambulatory Visit (HOSPITAL_COMMUNITY): Payer: Self-pay

## 2013-12-26 ENCOUNTER — Other Ambulatory Visit: Payer: Self-pay

## 2013-12-26 ENCOUNTER — Encounter (HOSPITAL_COMMUNITY)
Admission: RE | Disposition: A | Discharge status: Home / Self Care | Payer: Self-pay | Source: Ambulatory Visit | Attending: Cardiothoracic Surgery

## 2013-12-26 ENCOUNTER — Inpatient Hospital Stay (HOSPITAL_COMMUNITY)
Admission: RE | Admit: 2013-12-26 | Discharge: 2014-01-03 | DRG: 234 | Disposition: A | Discharge status: Home / Self Care | Payer: BC Managed Care – PPO | Source: Ambulatory Visit | Attending: Cardiothoracic Surgery | Admitting: Cardiothoracic Surgery

## 2013-12-26 DIAGNOSIS — Z981 Arthrodesis status: Secondary | ICD-10-CM

## 2013-12-26 DIAGNOSIS — R079 Chest pain, unspecified: Secondary | ICD-10-CM

## 2013-12-26 DIAGNOSIS — Z01810 Encounter for preprocedural cardiovascular examination: Secondary | ICD-10-CM

## 2013-12-26 DIAGNOSIS — Z7902 Long term (current) use of antithrombotics/antiplatelets: Secondary | ICD-10-CM

## 2013-12-26 DIAGNOSIS — E669 Obesity, unspecified: Secondary | ICD-10-CM | POA: Diagnosis present

## 2013-12-26 DIAGNOSIS — Z79899 Other long term (current) drug therapy: Secondary | ICD-10-CM

## 2013-12-26 DIAGNOSIS — I2510 Atherosclerotic heart disease of native coronary artery without angina pectoris: Secondary | ICD-10-CM

## 2013-12-26 DIAGNOSIS — I10 Essential (primary) hypertension: Secondary | ICD-10-CM

## 2013-12-26 DIAGNOSIS — I25119 Atherosclerotic heart disease of native coronary artery with unspecified angina pectoris: Secondary | ICD-10-CM

## 2013-12-26 DIAGNOSIS — Z9861 Coronary angioplasty status: Secondary | ICD-10-CM

## 2013-12-26 DIAGNOSIS — T82897A Other specified complication of cardiac prosthetic devices, implants and grafts, initial encounter: Secondary | ICD-10-CM | POA: Diagnosis present

## 2013-12-26 DIAGNOSIS — Z6835 Body mass index (BMI) 35.0-35.9, adult: Secondary | ICD-10-CM

## 2013-12-26 DIAGNOSIS — D62 Acute posthemorrhagic anemia: Secondary | ICD-10-CM | POA: Diagnosis not present

## 2013-12-26 DIAGNOSIS — E871 Hypo-osmolality and hyponatremia: Secondary | ICD-10-CM | POA: Diagnosis not present

## 2013-12-26 DIAGNOSIS — G4733 Obstructive sleep apnea (adult) (pediatric): Secondary | ICD-10-CM | POA: Diagnosis present

## 2013-12-26 DIAGNOSIS — I252 Old myocardial infarction: Secondary | ICD-10-CM

## 2013-12-26 DIAGNOSIS — Y831 Surgical operation with implant of artificial internal device as the cause of abnormal reaction of the patient, or of later complication, without mention of misadventure at the time of the procedure: Secondary | ICD-10-CM | POA: Diagnosis present

## 2013-12-26 DIAGNOSIS — I739 Peripheral vascular disease, unspecified: Secondary | ICD-10-CM | POA: Diagnosis present

## 2013-12-26 DIAGNOSIS — T502X5A Adverse effect of carbonic-anhydrase inhibitors, benzothiadiazides and other diuretics, initial encounter: Secondary | ICD-10-CM | POA: Diagnosis not present

## 2013-12-26 DIAGNOSIS — Z7982 Long term (current) use of aspirin: Secondary | ICD-10-CM

## 2013-12-26 DIAGNOSIS — Z951 Presence of aortocoronary bypass graft: Secondary | ICD-10-CM

## 2013-12-26 DIAGNOSIS — E785 Hyperlipidemia, unspecified: Secondary | ICD-10-CM | POA: Diagnosis present

## 2013-12-26 DIAGNOSIS — I498 Other specified cardiac arrhythmias: Secondary | ICD-10-CM | POA: Diagnosis present

## 2013-12-26 DIAGNOSIS — Z96659 Presence of unspecified artificial knee joint: Secondary | ICD-10-CM

## 2013-12-26 DIAGNOSIS — M129 Arthropathy, unspecified: Secondary | ICD-10-CM | POA: Diagnosis present

## 2013-12-26 DIAGNOSIS — Z9884 Bariatric surgery status: Secondary | ICD-10-CM

## 2013-12-26 DIAGNOSIS — Z87891 Personal history of nicotine dependence: Secondary | ICD-10-CM

## 2013-12-26 DIAGNOSIS — E876 Hypokalemia: Secondary | ICD-10-CM | POA: Diagnosis not present

## 2013-12-26 DIAGNOSIS — I77810 Thoracic aortic ectasia: Secondary | ICD-10-CM

## 2013-12-26 DIAGNOSIS — I200 Unstable angina: Secondary | ICD-10-CM | POA: Diagnosis present

## 2013-12-26 DIAGNOSIS — E8779 Other fluid overload: Secondary | ICD-10-CM | POA: Diagnosis not present

## 2013-12-26 HISTORY — PX: LEFT HEART CATHETERIZATION WITH CORONARY ANGIOGRAM: SHX5451

## 2013-12-26 LAB — PROTIME-INR
INR: 1.05 (ref 0.00–1.49)
INR: 1.04 (ref 0.00–1.49)
Prothrombin Time: 13.7 seconds (ref 11.6–15.2)
Prothrombin Time: 13.6 seconds (ref 11.6–15.2)

## 2013-12-26 LAB — LIPID PANEL
Cholesterol: 173 mg/dL (ref 0–200)
HDL: 56 mg/dL (ref >39)
LDL Cholesterol: 95 mg/dL (ref 0–99)
Total CHOL/HDL Ratio: 3.1 RATIO
Triglycerides: 109 mg/dL (ref <150)
VLDL: 22 mg/dL (ref 0–40)

## 2013-12-26 LAB — URINALYSIS, ROUTINE W REFLEX MICROSCOPIC
Bilirubin Urine: NEGATIVE
Glucose, UA: NEGATIVE mg/dL
Hgb urine dipstick: NEGATIVE
Ketones, ur: NEGATIVE mg/dL
Leukocytes, UA: NEGATIVE
Nitrite: NEGATIVE
Protein, ur: NEGATIVE mg/dL
Specific Gravity, Urine: 1.029 (ref 1.005–1.030)
Urobilinogen, UA: 0.2 mg/dL (ref 0.0–1.0)
pH: 6.5 (ref 5.0–8.0)

## 2013-12-26 LAB — BASIC METABOLIC PANEL
BUN: 11 mg/dL (ref 6–23)
CO2: 23 mEq/L (ref 19–32)
Calcium: 9 mg/dL (ref 8.4–10.5)
Chloride: 98 mEq/L (ref 96–112)
Creatinine, Ser: 0.77 mg/dL (ref 0.50–1.35)
GFR calc Af Amer: >90 mL/min (ref >90)
GFR calc non Af Amer: >90 mL/min (ref >90)
Glucose, Bld: 80 mg/dL (ref 70–99)
Potassium: 3.8 mEq/L (ref 3.7–5.3)
Sodium: 138 mEq/L (ref 137–147)

## 2013-12-26 LAB — CBC
HCT: 46.6 % (ref 39.0–52.0)
HCT: 49 % (ref 39.0–52.0)
Hemoglobin: 16 g/dL (ref 13.0–17.0)
Hemoglobin: 16.6 g/dL (ref 13.0–17.0)
MCH: 31.3 pg (ref 26.0–34.0)
MCH: 31.6 pg (ref 26.0–34.0)
MCHC: 34.3 g/dL (ref 30.0–36.0)
MCHC: 33.9 g/dL (ref 30.0–36.0)
MCV: 91 fL (ref 78.0–100.0)
MCV: 93.3 fL (ref 78.0–100.0)
PLATELETS: 195 10*3/uL (ref 150–400)
Platelets: 188 10*3/uL (ref 150–400)
RBC: 5.12 MIL/uL (ref 4.22–5.81)
RBC: 5.25 MIL/uL (ref 4.22–5.81)
RDW: 13.6 % (ref 11.5–15.5)
RDW: 13.7 % (ref 11.5–15.5)
WBC: 4.2 10*3/uL (ref 4.0–10.5)
WBC: 5.3 10*3/uL (ref 4.0–10.5)

## 2013-12-26 LAB — CREATININE, SERUM
CREATININE: 0.83 mg/dL (ref 0.50–1.35)
GFR calc Af Amer: >90 mL/min (ref >90)
GFR calc non Af Amer: >90 mL/min (ref >90)

## 2013-12-26 LAB — APTT: aPTT: 30 seconds (ref 24–37)

## 2013-12-26 LAB — TSH: TSH: 1.85 u[IU]/mL (ref 0.350–4.500)

## 2013-12-26 SURGERY — LEFT HEART CATHETERIZATION WITH CORONARY ANGIOGRAM
Anesthesia: LOCAL

## 2013-12-26 MED ORDER — ATORVASTATIN CALCIUM 20 MG PO TABS
20.0000 mg | ORAL_TABLET | Freq: Every day | ORAL | Status: DC
  Administered 2013-12-26 – 2013-12-28 (×3): 20 mg via ORAL
  Filled 2013-12-26 (×5): qty 1

## 2013-12-26 MED ORDER — ONDANSETRON HCL 4 MG/2ML IJ SOLN: 4.0000 mg | Freq: Four times a day (QID) | INTRAMUSCULAR | Status: DC | PRN

## 2013-12-26 MED ORDER — ENOXAPARIN SODIUM 40 MG/0.4ML ~~LOC~~ SOLN: 40.0000 mg | SUBCUTANEOUS | Status: DC

## 2013-12-26 MED ORDER — SODIUM CHLORIDE 0.9 % IV SOLN: 250.0000 mL | INTRAVENOUS | Status: DC | PRN

## 2013-12-26 MED ORDER — ACETAMINOPHEN 325 MG PO TABS: 650.0000 mg | ORAL_TABLET | ORAL | Status: DC | PRN

## 2013-12-26 MED ORDER — NITROGLYCERIN 0.2 MG/ML ON CALL CATH LAB
INTRAVENOUS | Status: AC
  Filled 2013-12-26: qty 1

## 2013-12-26 MED ORDER — HEPARIN (PORCINE) IN NACL 2-0.9 UNIT/ML-% IJ SOLN
INTRAMUSCULAR | Status: AC
  Filled 2013-12-26: qty 1000

## 2013-12-26 MED ORDER — LOSARTAN POTASSIUM-HCTZ 100-25 MG PO TABS: 1.0000 | ORAL_TABLET | Freq: Every morning | ORAL | Status: DC

## 2013-12-26 MED ORDER — MIDAZOLAM HCL 2 MG/2ML IJ SOLN
INTRAMUSCULAR | Status: AC
  Filled 2013-12-26: qty 2

## 2013-12-26 MED ORDER — SODIUM CHLORIDE 0.9 % IV SOLN: 1.0000 mL/kg/h | INTRAVENOUS | Status: AC

## 2013-12-26 MED ORDER — HYDROCHLOROTHIAZIDE 25 MG PO TABS
25.0000 mg | ORAL_TABLET | Freq: Every day | ORAL | Status: DC
  Administered 2013-12-27 – 2013-12-29 (×3): 25 mg via ORAL
  Filled 2013-12-26 (×5): qty 1

## 2013-12-26 MED ORDER — CYCLOBENZAPRINE HCL 10 MG PO TABS
10.0000 mg | ORAL_TABLET | Freq: Three times a day (TID) | ORAL | Status: DC | PRN
  Filled 2013-12-26 (×2): qty 1

## 2013-12-26 MED ORDER — AMLODIPINE BESYLATE 5 MG PO TABS
5.0000 mg | ORAL_TABLET | Freq: Every day | ORAL | Status: DC
  Administered 2013-12-27 – 2013-12-29 (×3): 5 mg via ORAL
  Filled 2013-12-26 (×5): qty 1

## 2013-12-26 MED ORDER — HEPARIN (PORCINE) IN NACL 2-0.9 UNIT/ML-% IJ SOLN
INTRAMUSCULAR | Status: AC
  Filled 2013-12-26: qty 500

## 2013-12-26 MED ORDER — HEPARIN SODIUM (PORCINE) 1000 UNIT/ML IJ SOLN
INTRAMUSCULAR | Status: AC
  Filled 2013-12-26: qty 1

## 2013-12-26 MED ORDER — NITROGLYCERIN 0.4 MG SL SUBL: 0.4000 mg | SUBLINGUAL_TABLET | SUBLINGUAL | Status: DC | PRN

## 2013-12-26 MED ORDER — METOPROLOL TARTRATE 50 MG PO TABS
50.0000 mg | ORAL_TABLET | Freq: Two times a day (BID) | ORAL | Status: DC
  Administered 2013-12-26 – 2013-12-29 (×7): 50 mg via ORAL
  Filled 2013-12-26 (×10): qty 1

## 2013-12-26 MED ORDER — SODIUM CHLORIDE 0.9 % IJ SOLN: 3.0000 mL | INTRAMUSCULAR | Status: DC | PRN

## 2013-12-26 MED ORDER — VERAPAMIL HCL 2.5 MG/ML IV SOLN
INTRAVENOUS | Status: AC
  Filled 2013-12-26: qty 2

## 2013-12-26 MED ORDER — ASPIRIN 325 MG PO TABS
325.0000 mg | ORAL_TABLET | Freq: Every day | ORAL | Status: DC
  Administered 2013-12-26 – 2013-12-29 (×4): 325 mg via ORAL
  Filled 2013-12-26 (×6): qty 1

## 2013-12-26 MED ORDER — FENTANYL CITRATE 0.05 MG/ML IJ SOLN
INTRAMUSCULAR | Status: AC
  Filled 2013-12-26: qty 2

## 2013-12-26 MED ORDER — SODIUM CHLORIDE 0.9 % IV SOLN
INTRAVENOUS | Status: DC
  Administered 2013-12-26: 11:00:00 via INTRAVENOUS

## 2013-12-26 MED ORDER — SODIUM CHLORIDE 0.9 % IJ SOLN
3.0000 mL | Freq: Two times a day (BID) | INTRAMUSCULAR | Status: DC
  Administered 2013-12-27 – 2013-12-29 (×5): 3 mL via INTRAVENOUS

## 2013-12-26 MED ORDER — LOSARTAN POTASSIUM 50 MG PO TABS
100.0000 mg | ORAL_TABLET | Freq: Every day | ORAL | Status: DC
  Administered 2013-12-27 – 2013-12-29 (×3): 100 mg via ORAL
  Filled 2013-12-26 (×5): qty 2

## 2013-12-26 MED ORDER — ASPIRIN 81 MG PO CHEW
81.0000 mg | CHEWABLE_TABLET | ORAL | Status: AC
  Administered 2013-12-26: 81 mg via ORAL
  Filled 2013-12-26: qty 1

## 2013-12-26 MED ORDER — LIDOCAINE HCL (PF) 1 % IJ SOLN
INTRAMUSCULAR | Status: AC
  Filled 2013-12-26: qty 30

## 2013-12-26 MED ORDER — ENOXAPARIN SODIUM 40 MG/0.4ML ~~LOC~~ SOLN
40.0000 mg | SUBCUTANEOUS | Status: DC
  Administered 2013-12-27 – 2013-12-29 (×3): 40 mg via SUBCUTANEOUS
  Filled 2013-12-26 (×5): qty 0.4

## 2013-12-26 NOTE — H&P (View-Only) (Signed)
Clinical Summary Mr. Saltz is a 63 y.o.male last seen by Dr Domenic Polite in 2013, this is our first visit together. He is seen for the following medical problems.  1. CAD - committed to lifelong plavix by previous cardiolgist per notes given his history of late stent thrombosis in 2005 - long history of coronary interventions, last cath in our records appears to be in 2005. - cath 2005 with LM normal, LAD with 3 overlapping stents beginning in proximal vessel with instent thrombus, LCX patent, RCA patent with a 20% narrowing at stent site in mid vessel. LV gram 40%. He had aspiration thrombectomy and PTCA of stent thrombosis in prox LAD.  - 05/2012 MPI fixed defect in the basal inferoseptum, mid inferior, apical inferior thought to be subdiaphragmatic attenuation, low risk study  - reports approx 2-3 months of intermittent 4/10 chest tightness.. No associated symptoms. Reports last Monday while walking, after getting to incline feeling of similar tightness. On level ground symptoms would resolved, however returned again on another incline. Resolved quickly with rest. Similar episode while walking same path one week later, with significant symptoms with walking up incline. - no significant SOB or DOE, no orthopnea, no PND - compliant with meds  2. HTN - checks at home occasionally, typically 130/80s  3. Hyperlipidemia - compliant with lipitor - 12/08/13: TC 172 TG 86 HDL 57 LDL 98    Past Medical History  Diagnosis Date  . Coronary atherosclerosis of native coronary artery     Multiple stents - RCA/LAD/diagonal, overlapping DES LAD, stent thrombosis 2005  . Essential hypertension, benign   . Peripheral vascular disease   . Myocardial infarction     Anterior with LAD stent thrombosis 2005  . Arthritis   . Hyperlipemia   . History of GI bleed     On nonsteroidals  . Aortic root dilatation     41 mm by CT 2008, 36 mm by echo 2011  . Obesity   . H/O hiatal hernia   . Sleep apnea      CPAP daily     Allergies  Allergen Reactions  . Chlorhexidine Itching    Pt. States on last admission CHG wipes made him itch and it had to be washed off.  Uncertain if benadryl was given.     Current Outpatient Prescriptions  Medication Sig Dispense Refill  . aspirin 325 MG tablet Take 325 mg by mouth daily.       Marland Kitchen atorvastatin (LIPITOR) 20 MG tablet Take 10 mg by mouth every evening.       Hinda Kehr 30 MG/ACT SOLN Apply 1 Act topically daily. Apply to each armpit daily      . cholecalciferol (VITAMIN D) 1000 UNITS tablet Take 1,000 Units by mouth daily.      . clopidogrel (PLAVIX) 75 MG tablet Take 75 mg by mouth every morning.       . Coenzyme Q10 (CO Q-10) 200 MG CAPS Take 1 capsule by mouth daily.      . cyclobenzaprine (FLEXERIL) 10 MG tablet Take 10 mg by mouth 3 (three) times daily as needed for muscle spasms.      . fish oil-omega-3 fatty acids 1000 MG capsule Take 1 g by mouth 2 (two) times a week. Takes maybe one per week      . losartan-hydrochlorothiazide (HYZAAR) 100-25 MG per tablet Take 1 tablet by mouth every morning.       . metoprolol (LOPRESSOR) 50 MG tablet Take 50  mg by mouth 2 (two) times daily.       . naproxen (NAPROSYN) 500 MG tablet Take 500 mg by mouth 2 (two) times daily with a meal.       . nitroGLYCERIN (NITROSTAT) 0.4 MG SL tablet Place 0.4 mg under the tongue every 5 (five) minutes as needed for chest pain.       . Pomegranate, Punica granatum, (POMEGRANATE PO) Take 435 mg by mouth daily.      . tadalafil (CIALIS) 20 MG tablet Take 20 mg by mouth daily as needed.       . TURMERIC PO Take 600 mg by mouth 2 (two) times daily.       No current facility-administered medications for this visit.     Past Surgical History  Procedure Laterality Date  . Appendectomy    . Cholecystectomy    . Total knee arthroplasty Right 2006  . Total knee arthroplasty Left 2007  . Elbow arthroscopy Right   . Shoulder arthroscopy Right 4/11  . Cervical fusion  9/10   . Coronary angioplasty with stent placement  7035,0093  . Carpal tunnel release Right 6/12  . Carpal tunnel release  06/06/2011    Procedure: CARPAL TUNNEL RELEASE;  Surgeon: Cammie Sickle., MD;  Location: [facility];  Service: Orthopedics;  Laterality: Right;  . Ulnar nerve transposition  06/06/2011    Procedure: ULNAR NERVE DECOMPRESSION/TRANSPOSITION;  Surgeon: Cammie Sickle., MD;  Location: [facility];  Service: Orthopedics;  Laterality: Right;  decompression ulnar nerve right cubital tunnel   . Breath tek h pylori  06/11/2012    Procedure: BREATH TEK H PYLORI;  Surgeon: Shann Medal, MD;  Location: [facility];  Service: General;  Laterality: N/A;  Seth Bake  . Laparoscopic gastric banding  3/14  . Carpal tunnel release Right 04/10/2013    Procedure: REVISION OF CARPAL TUNNEL RELEASE LIGAMENT RIGHT WRIST;  Surgeon: Cammie Sickle., MD;  Location: [facility];  Service: Orthopedics;  Laterality: Right;     Allergies  Allergen Reactions  . Chlorhexidine Itching    Pt. States on last admission CHG wipes made him itch and it had to be washed off.  Uncertain if benadryl was given.      Family History  Problem Relation Age of Onset  . Colon cancer Father   . Colon cancer Maternal Grandmother   . Obesity Mother   . Diabetes Maternal Grandfather      Social History Mr. Salmela reports that he has never smoked. He quit smokeless tobacco use about 10 years ago. Mr. Carnero reports that he drinks alcohol.   Review of Systems CONSTITUTIONAL: No weight loss, fever, chills, weakness or fatigue.  HEENT: Eyes: No visual loss, blurred vision, double vision or yellow sclerae.No hearing loss, sneezing, congestion, runny nose or sore throat.  SKIN: No rash or itching.  CARDIOVASCULAR: per HPI RESPIRATORY: No shortness of breath, cough or sputum.  GASTROINTESTINAL: No anorexia, nausea, vomiting or diarrhea. No abdominal pain or blood.   GENITOURINARY: No burning on urination, no polyuria NEUROLOGICAL: No headache, dizziness, syncope, paralysis, ataxia, numbness or tingling in the extremities. No change in bowel or bladder control.  MUSCULOSKELETAL: No muscle, back pain, joint pain or stiffness.  LYMPHATICS: No enlarged nodes. No history of splenectomy.  PSYCHIATRIC: No history of depression or anxiety.  ENDOCRINOLOGIC: No reports of sweating, cold or heat intolerance. No polyuria or polydipsia.  Marland Kitchen   Physical Examination There were  no vitals filed for this visit. There were no vitals filed for this visit.  Gen: resting comfortably, no acute distress HEENT: no scleral icterus, pupils equal round and reactive, no palptable cervical adenopathy,  CV Resp: Clear to auscultation bilaterally GI: abdomen is soft, non-tender, non-distended, normal bowel sounds, no hepatosplenomegaly MSK: extremities are warm, no edema.  Skin: warm, no rash Neuro:  no focal deficits Psych: appropriate affect   Diagnostic Studies 02/2004 Cath RESULTS: Left main coronary artery: Free of significant disease.  Left anterior descending artery: Gave rise to two diagonal branches and a  large septal perforator. There were three overlapping stents beginning in  the proximal LAD just after the first diagonal branch and extending to the  mid LAD. Within the most proximal stent there was a filling defect which is  felt to represent a thrombus and there was moderate narrowing about a third  the way down where the septal diagonal arose. There was about 40% narrowing  at the ostium of the diagonal branch which had been recently dilated in  February.  Circumflex artery: Gave rise to a large marginal branch and a large  posterolateral branch. These vessels were free of significant disease.  Right coronary artery: Moderate sized vessel. Gave rise to a conus branch,  a right ventricular branch, a posterior descending branch, and a small  posterolateral  branch. There was less than 20% narrowing at the stent site  in the mid right coronary artery.  LEFT VENTRICULOGRAM: The left ventriculogram performed in the RAO  projection showed akinesis of the apex. The anterolateral moved well and  the inferior wall moved well. The estimated ejection fraction was 40%.  This wall motion abnormality was new from the prior study.  Following aspiration thrombectomy and balloon dilatation of the stent in the  proximal LAD the thrombus resolved and the stent diameter improved and the  flow improved from TIMI 2 to TIMI 3 flow.  CONCLUSIONS:  1. Acute anterior wall myocardial infarction due to stent thrombosis within  the stent in the proximal left anterior descending with 50% stenosis  within the stent in the proximal left anterior descending with thrombus,  40% narrowing in the diagonal branch of the left anterior descending, no  major obstruction of circumflex artery, less than 20% narrowing at the  stent in the right coronary artery, and apical wall akinesis.  2. Successful aspiration thrombectomy and PTCA for stent thrombosis in the  proximal left anterior descending with improvement in center of narrowing  from 50% to 0% and improvement in the flow from TIMI 2 to TIMI 3 flow.  DISPOSITION: The patient returned to postangiographic suite for further  observation.     Assessment and Plan  1. Chest pain - symptoms concerning for angina, high pretest probaility given his history and description of symptoms. Will add norvasc as additional antianginal. Refer for cath for evaluation of coronaries.   2. HTN - at goal, continue current meds  3. Hyperlipidemia - will increase lipitor to 20mg  daily, he is not in favor of high dose statin at this time   F/u after cath  Arnoldo Lenis, M.D., F.A.C.C.

## 2013-12-26 NOTE — Progress Notes (Signed)
Pt was placed on CPAP with nasal mask. Auto CPAP min setting of 4 and max setting of 20 cmH2O. Pt is tolerating well at this time. RT made pt aware to call if any questions.

## 2013-12-26 NOTE — Consult Note (Signed)
[address]       [place] [address]             [phone]        [place] C [facility] Medical Record #[id number] Date of Birth: 09/04/1950  Referring: No ref. provider found Primary Care: Rory Percy, MD  Chief Complaint: Chest pain, recurrent angina with exertion   patient examined, coronary angiogram reviewed   History of Present Illness:     63 year old obese(status pos tbariatric surgery )Caucasian male nondiabetic nonsmoker with prior history of significant coronary disease and previous PCI to the RCA and LAD on chronic Plavix presents with unstable angina. His symptoms are with  exertion. Cardiac enzymes have been negative. Cardiac angiogram was performed today demonstrates left main stenosis, proximal LAD stenosis, proximal circumflex stenosis and mild disease of the RCA with a patent stent. LV function fairly well preserved. No evidence of AS or MR. Patient is admitted to cardiology for Plavix washout then the CABG.   Current Activity/ Functional Status: Patient is retired is still does some part-time working. He is married lives with his wife. He states he is fairly active and travels   Zubrod Score: At the time of surgery this patient's most appropriate activity status/level should be described as: []     0    Normal activity, no symptoms []     1    Restricted in physical strenuous activity but ambulatory, able to do out light work [x]     2    Ambulatory and capable of self care, unable to do work activities, up and about                 more than 50%  Of the time                            []     3    Only limited self care, in bed greater than 50% of waking hours []     4    Completely disabled, no self care, confined to bed or chair []     5    Moribund  Past Medical History  Diagnosis Date  . Coronary atherosclerosis of native coronary artery     Multiple stents - RCA/LAD/diagonal, overlapping DES LAD, stent thrombosis 2005  .  Essential hypertension, benign   . Peripheral vascular disease   . Myocardial infarction     Anterior with LAD stent thrombosis 2005  . Arthritis   . Hyperlipemia   . History of GI bleed     On nonsteroidals  . Aortic root dilatation     41 mm by CT 2008, 36 mm by echo 2011  . Obesity   . H/O hiatal hernia   . Sleep apnea     CPAP daily    Past Surgical History  Procedure Laterality Date  . Appendectomy    . Cholecystectomy    . Total knee arthroplasty Right 2006  . Total knee arthroplasty Left 2007  . Elbow arthroscopy Right   . Shoulder arthroscopy Right 4/11  . Cervical fusion  9/10  . Coronary angioplasty with stent placement  1093,2355  . Carpal tunnel release Right 6/12  . Carpal tunnel release  06/06/2011    Procedure: CARPAL TUNNEL RELEASE;  Surgeon: Cammie Sickle., MD;  Location: [facility];  Service: Orthopedics;  Laterality: Right;  . Ulnar nerve transposition  06/06/2011  Procedure: ULNAR NERVE DECOMPRESSION/TRANSPOSITION;  Surgeon: Cammie Sickle., MD;  Location: [facility];  Service: Orthopedics;  Laterality: Right;  decompression ulnar nerve right cubital tunnel   . Breath tek h pylori  06/11/2012    Procedure: BREATH TEK H PYLORI;  Surgeon: Shann Medal, MD;  Location: [facility];  Service: General;  Laterality: N/A;  Seth Bake  . Laparoscopic gastric banding  3/14  . Carpal tunnel release Right 04/10/2013    Procedure: REVISION OF CARPAL TUNNEL RELEASE LIGAMENT RIGHT WRIST;  Surgeon: Cammie Sickle., MD;  Location: [facility];  Service: Orthopedics;  Laterality: Right;    History  Smoking status  . Never Smoker   Smokeless tobacco  . Former Systems developer  . Quit date: 07/03/2003    Comment: chewing tobacco    History  Alcohol Use  . Yes    Comment:  sometimes 4 to 5 beers a day - but not every day    History   Social History  . Marital Status: Married    Spouse Name: N/A    Number of Children:  N/A  . Years of Education: N/A   Occupational History  . Not on file.   Social History Main Topics  . Smoking status: Never Smoker   . Smokeless tobacco: Former Systems developer    Quit date: 07/03/2003     Comment: chewing tobacco  . Alcohol Use: Yes     Comment:  sometimes 4 to 5 beers a day - but not every day  . Drug Use: No  . Sexual Activity: Not on file   Other Topics Concern  . Not on file   Social History Narrative  . No narrative on file    Allergies  Allergen Reactions  . Chlorhexidine Itching    Pt. States on last admission CHG wipes made him itch and it had to be washed off.  Uncertain if benadryl was given.    Current Facility-Administered Medications  Medication Dose Route Frequency Provider Last Rate Last Dose  . 0.9 %  sodium chloride infusion  250 mL Intravenous PRN Leonie Man, MD      . 0.9 %  sodium chloride infusion   Intravenous Continuous Leonie Man, MD 75 mL/hr at 12/26/13 1055    . 0.9 %  sodium chloride infusion  1 mL/kg/hr Intravenous Continuous Larey Dresser, MD      . acetaminophen (TYLENOL) tablet 650 mg  650 mg Oral Q4H PRN Larey Dresser, MD      . amLODipine (NORVASC) tablet 5 mg  5 mg Oral Daily Larey Dresser, MD      . aspirin tablet 325 mg  325 mg Oral Daily Larey Dresser, MD      . atorvastatin (LIPITOR) tablet 20 mg  20 mg Oral Daily Larey Dresser, MD      . cyclobenzaprine (FLEXERIL) tablet 10 mg  10 mg Oral TID PRN Larey Dresser, MD      . enoxaparin (LOVENOX) injection 40 mg  40 mg Subcutaneous Q24H Larey Dresser, MD      . hydrochlorothiazide (HYDRODIURIL) tablet 25 mg  25 mg Oral Daily Larey Dresser, MD      . losartan (COZAAR) tablet 100 mg  100 mg Oral Daily Larey Dresser, MD      . metoprolol (LOPRESSOR) tablet 50 mg  50 mg Oral BID Larey Dresser, MD      . nitroGLYCERIN (NITROSTAT)  SL tablet 0.4 mg  0.4 mg Sublingual Q5 min PRN Larey Dresser, MD      . ondansetron [facility]) injection 4 mg  4 mg Intravenous  Q6H PRN Larey Dresser, MD      . sodium chloride 0.9 % injection 3 mL  3 mL Intravenous Q12H Leonie Man, MD      . sodium chloride 0.9 % injection 3 mL  3 mL Intravenous PRN Leonie Man, MD        Prescriptions prior to admission  Medication Sig Dispense Refill  . aspirin 325 MG tablet Take 325 mg by mouth daily.       Marland Kitchen atorvastatin (LIPITOR) 20 MG tablet Take 1 tablet (20 mg total) by mouth daily.  30 tablet  6  . AXIRON 30 MG/ACT SOLN Apply 1 Act topically daily. Apply to each armpit daily      . cholecalciferol (VITAMIN D) 1000 UNITS tablet Take 1,000 Units by mouth daily.      . clopidogrel (PLAVIX) 75 MG tablet Take 75 mg by mouth every morning.       . Coenzyme Q10 (CO Q-10) 200 MG CAPS Take 1 capsule by mouth daily.      . cyclobenzaprine (FLEXERIL) 10 MG tablet Take 10 mg by mouth 3 (three) times daily as needed for muscle spasms.      . fish oil-omega-3 fatty acids 1000 MG capsule Take 1 g by mouth 2 (two) times a week. Takes maybe one per week      . losartan-hydrochlorothiazide (HYZAAR) 100-25 MG per tablet Take 1 tablet by mouth every morning.       . metoprolol (LOPRESSOR) 50 MG tablet Take 50 mg by mouth 2 (two) times daily.       . naproxen (NAPROSYN) 500 MG tablet Take 500 mg by mouth 2 (two) times daily with a meal.       . Pomegranate, Punica granatum, (POMEGRANATE PO) Take 435 mg by mouth daily.      . TURMERIC PO Take 600 mg by mouth 2 (two) times daily.      Marland Kitchen amLODipine (NORVASC) 5 MG tablet Take 1 tablet (5 mg total) by mouth daily.  30 tablet  6  . nitroGLYCERIN (NITROSTAT) 0.4 MG SL tablet Place 0.4 mg under the tongue every 5 (five) minutes as needed for chest pain.       . tadalafil (CIALIS) 20 MG tablet Take 20 mg by mouth daily as needed.         Family History  Problem Relation Age of Onset  . Colon cancer Father   . Colon cancer Maternal Grandmother   . Obesity Mother   . Diabetes Maternal Grandfather      Review of Systems:  The patient  has had multiple orthopedic procedures on both legs and both arms as well as his cervical spine.  The patient is a LAP-BAND around the GE junction and has difficulty swallowing some types of food. The LAP-BAND has not been adjusted over a year  The patient is right-hand dominant.  The patient denies previous trauma to the chest or pneumothorax pneumonia or hemoptysis     Cardiac Review of Systems: Y or N  Chest Pain [ yes   ]  Resting SOB [ no  ] Exertional SOB  [  yes]  Orthopnea [no  ]   Pedal Edema [no   ]    Palpitations [no  ] Syncope  [  no]  Presyncope [ no  ]  General Review of Systems: [Y] = yes [  ]=no Constitional: recent weight change [  ]; anorexia [  ]; fatigue [  ]; nausea [  ]; night sweats [  ]; fever [  ]; or chills [  ]                                                               Dental: poor dentition[  ]; Last Dentist visit: Physical exam annually   Eye : blurred vision [  ]; diplopia [   ]; vision changes [  ];  Amaurosis fugax[  ]; Resp: cough [  ];  wheezing[  ];  hemoptysis[  ]; shortness of breath[  ]; paroxysmal nocturnal dyspnea[  ]; dyspnea on exertion[yes  ]; or orthopnea[  ];  GI:  gallstones[  ], vomiting[  ];  dysphagia[  ]; melena[  ];  hematochezia [  ]; heartburn[  ];   Hx of  Colonoscopy[  ]; GU: kidney stones [  ]; hematuria[  ];   dysuria [  ];  nocturia[  ];  history of     obstruction [  ]; urinary frequency [  ]             Skin: rash, swelling[  ];, hair loss[  ];  peripheral edema[  ];  or itching[  ]; Musculosketetal: myalgias[yes  ];  joint swelling[  ];  joint erythema[  ];  joint pain[yes  ];  back pain[  ];  Heme/Lymph: bruising[  ];  bleeding[  ];  anemia[  ];  Neuro: TIA[  ];  headaches[  ];  stroke[  ];  vertigo[  ];  seizures[  ];   paresthesias[  ];  difficulty walking[  ];  Psych:depression[  ]; anxiety[  ];  Endocrine: diabetes[  ];  thyroid dysfunction[  ];  Immunizations: Flu [  ]; Pneumococcal[  ];  Other:  Physical Exam: BP  132/68  Pulse 39  Temp(Src) 98.1 F (36.7 C) (Oral)  Resp 20  Ht 5' 8.5" (1.74 m)  Wt 239 lb (108.41 kg)  BMI 35.81 kg/m2  SpO2 100% General appearance-middle-aged obese male no acute distress HEENT normocephalic dentition good pupils equal Neck without JVD mass or bruit-posterior surgical scar from cervical laminectomy Thorax-note deformity tenderness breath sounds clear Cardiac-regular rhythm without murmur or gallop Abdomen-obese surgical scar from LAP-BAND placement and LAP-BAND reservoir right upper quadrant Extremities-surgical scars over both knees and both hands from orthopedic procedures Vascular-no evidence of venous insufficiency lower extremities, pulses present Neuro-right-hand dominant no focal motor deficit    Diagnostic Studies & Laboratory data: Cardiac catheterization left main stenosis proximal LAD and circumflex disease RCA with minimal disease in a patent stent     Recent Radiology Findings:   No results found.    Recent Lab Findings: Lab Results  Component Value Date   WBC 4.2 12/26/2013   HGB 16.0 12/26/2013   HCT 46.6 12/26/2013   PLT 188 12/26/2013   GLUCOSE 80 12/26/2013   ALT 23 08/29/2012   AST 23 08/29/2012   NA 138 12/26/2013   K 3.8 12/26/2013   CL 98 12/26/2013   CREATININE 0.77 12/26/2013   BUN 11 12/26/2013   CO2 23 12/26/2013  INR 1.05 12/26/2013      Assessment / Plan:      Patient will benefit from surgical coronary  revascularization. The patient is currently stable without resting angina We'll plan Plavix washout prior to multivessel CABG since patient is stable. We'll check baseline P2 Y. 12 level. We'll plan surgery first part of next week as long as patient remains stable  Patient's ascending aorta previously was 4.1 cm. Check CT of thoracic aorta prior to surgery.    @ME1 @ 12/26/2013 7:56 PM

## 2013-12-26 NOTE — Interval H&P Note (Signed)
Cath Lab Visit (complete for each Cath Lab visit)  Clinical Evaluation Leading to the Procedure:   ACS: No.  Non-ACS:    Anginal Classification: CCS III  Anti-ischemic medical therapy: Maximal Therapy (2 or more classes of medications)  Non-Invasive Test Results: No non-invasive testing performed  Prior CABG: No previous CABG      History and Physical Interval Note:  12/26/2013 3:23 PM  [name]  has presented today for surgery, with the diagnosis of cp  The various methods of treatment have been discussed with the patient and family. After consideration of risks, benefits and other options for treatment, the patient has consented to  Procedure(s): LEFT HEART CATHETERIZATION WITH CORONARY ANGIOGRAM (N/A) as a surgical intervention .  The patient's history has been reviewed, patient examined, no change in status, stable for surgery.  I have reviewed the patient's chart and labs.  Questions were answered to the patient's satisfaction.     Dalton [employer]

## 2013-12-26 NOTE — Progress Notes (Addendum)
VASCULAR LAB PRELIMINARY  PRELIMINARY  PRELIMINARY  PRELIMINARY  Pre-op Cardiac Surgery  Carotid Findings:  Bilateral:  1-39% ICA stenosis.  Vertebral artery flow is antegrade.     Upper Extremity Right Left  Brachial Pressures 139T 136T  Radial Waveforms T T  Ulnar Waveforms T T  Palmar Arch (Allen's Test) WNL WNL   Findings:      Lower  Extremity Right Left  Dorsalis Pedis    Anterior Tibial    Posterior Tibial    Ankle/Brachial Indices      Findings:  Palpable pedal pulses bilaterally.   [name], [name], RVS 12/26/2013, 6:15 PM

## 2013-12-26 NOTE — CV Procedure (Signed)
    Cardiac Catheterization Procedure Note  Name: [name] MRN: [id number] DOB: [birth year]  Procedure: Left Heart Cath, Selective Coronary Angiography, LV angiography  Indication: Exertional chest pain x 2 weeks with history of CAD.    Procedural Details: The right wrist was prepped, draped, and anesthetized with 1% lidocaine. Using the modified Seldinger technique, a 5 French sheath was introduced into the right radial artery. 3 mg of verapamil was administered through the sheath, weight-based unfractionated heparin was administered intravenously. Standard Judkins catheters were used for selective coronary angiography and left ventriculography. Catheter exchanges were performed over an exchange length guidewire. There were no immediate procedural complications. A TR band was used for radial hemostasis at the completion of the procedure.  The patient was transferred to the post catheterization recovery area for further monitoring.  Procedural Findings: Hemodynamics: AO 128/64 LV 138/13  Coronary angiography: Coronary dominance: right  Left mainstem: 90+% distal left main stenosis.   Left anterior descending (LAD): 95% ostial LAD stenosis.  There is a long stented segment in the proximal LAD with probable small chronic dissection towards the distal end of the stented segment.  Moderate D1 with 40% proximal stenosis.   Left circumflex (LCx): 90% ostial LCx stenosis.  Large branching PLOM with 30% ostial stenosis.   Right coronary artery (RCA): Proximal RCA stent with about 30% distal in-stent restenosis.  The RCA is dominant but relatively small.   Left ventriculography: Left ventricular systolic function is normal, LVEF is estimated at 55% without wall motion abnormalities in the RAO projection, there is no significant mitral regurgitation   Final Conclusions:  Severe complex lesion at the bifurcation of the left main into the LAD and LCx.  There is 90+% distal left main  stenosis, 95% ostial LAD stenosis, and 90% ostial LCx stenosis.  The patient has had 2 weeks of new exertional chest pain, no chest pain at rest.  Technically, this is unstable angina.  I am uncomfortable sending him home with the new symptoms and the severe complicated LM bifurcation lesion.  He will need to stop Plavix.  I am going to admit him for Plavix wash-out followed by CABG.  Will have CVTS see him (called).   Loralie Champagne 12/26/2013, 4:04 PM

## 2013-12-27 ENCOUNTER — Ambulatory Visit (HOSPITAL_COMMUNITY): Payer: BC Managed Care – PPO

## 2013-12-27 ENCOUNTER — Encounter (HOSPITAL_COMMUNITY)
Admission: RE | Disposition: A | Discharge status: Home / Self Care | Payer: Self-pay | Source: Ambulatory Visit | Attending: Cardiothoracic Surgery

## 2013-12-27 DIAGNOSIS — I77810 Thoracic aortic ectasia: Secondary | ICD-10-CM

## 2013-12-27 DIAGNOSIS — I10 Essential (primary) hypertension: Secondary | ICD-10-CM

## 2013-12-27 DIAGNOSIS — E785 Hyperlipidemia, unspecified: Secondary | ICD-10-CM

## 2013-12-27 DIAGNOSIS — I519 Heart disease, unspecified: Secondary | ICD-10-CM

## 2013-12-27 DIAGNOSIS — I200 Unstable angina: Secondary | ICD-10-CM

## 2013-12-27 DIAGNOSIS — I209 Angina pectoris, unspecified: Secondary | ICD-10-CM

## 2013-12-27 DIAGNOSIS — I2510 Atherosclerotic heart disease of native coronary artery without angina pectoris: Principal | ICD-10-CM

## 2013-12-27 LAB — HEMOGLOBIN A1C
HEMOGLOBIN A1C: 5.5 % (ref <5.7)
Mean Plasma Glucose: 111 mg/dL (ref <117)

## 2013-12-27 LAB — PLATELET INHIBITION P2Y12: PLATELET FUNCTION P2Y12: 42 [PRU] — AB (ref 194–418)

## 2013-12-27 LAB — SURGICAL PCR SCREEN
MRSA, PCR: NEGATIVE
Staphylococcus aureus: POSITIVE — AB

## 2013-12-27 LAB — BASIC METABOLIC PANEL
BUN: 16 mg/dL (ref 6–23)
CHLORIDE: 100 meq/L (ref 96–112)
CO2: 26 meq/L (ref 19–32)
Calcium: 8.7 mg/dL (ref 8.4–10.5)
Creatinine, Ser: 0.86 mg/dL (ref 0.50–1.35)
GFR calc Af Amer: >90 mL/min (ref >90)
GFR calc non Af Amer: >90 mL/min (ref >90)
GLUCOSE: 81 mg/dL (ref 70–99)
POTASSIUM: 4.1 meq/L (ref 3.7–5.3)
SODIUM: 139 meq/L (ref 137–147)

## 2013-12-27 LAB — CBC
HEMATOCRIT: 45.3 % (ref 39.0–52.0)
HEMOGLOBIN: 15 g/dL (ref 13.0–17.0)
MCH: 30.5 pg (ref 26.0–34.0)
MCHC: 33.1 g/dL (ref 30.0–36.0)
MCV: 92.3 fL (ref 78.0–100.0)
Platelets: 171 10*3/uL (ref 150–400)
RBC: 4.91 MIL/uL (ref 4.22–5.81)
RDW: 13.9 % (ref 11.5–15.5)
WBC: 7 10*3/uL (ref 4.0–10.5)

## 2013-12-27 LAB — LIPID PANEL
Cholesterol: 146 mg/dL (ref 0–200)
HDL: 49 mg/dL (ref >39)
LDL CALC: 79 mg/dL (ref 0–99)
Total CHOL/HDL Ratio: 3 RATIO
Triglycerides: 90 mg/dL (ref <150)
VLDL: 18 mg/dL (ref 0–40)

## 2013-12-27 SURGERY — CORONARY ARTERY BYPASS GRAFTING (CABG)
Anesthesia: General | Site: Chest

## 2013-12-27 MED ORDER — ACETAMINOPHEN 500 MG PO TABS
1000.0000 mg | ORAL_TABLET | Freq: Two times a day (BID) | ORAL | Status: DC
  Administered 2013-12-27 – 2013-12-29 (×6): 1000 mg via ORAL
  Filled 2013-12-27 (×10): qty 2

## 2013-12-27 NOTE — Progress Notes (Signed)
Pt has home CPAP with tubing and nasal pillows. Pt unit was checked and ok to wear. PT places self on and off CPAP. RT made pt aware that if he had any concerns to call.

## 2013-12-27 NOTE — Progress Notes (Signed)
SUBJECTIVE: Pt feeling well. Denies chest pain, palpitations, and shortness of breath. Only complaint is arthritis.     Intake/Output Summary (Last 24 hours) at 12/27/13 1042 Last data filed at 12/27/13 1005  Gross per 24 hour  Intake    723 ml  Output      0 ml  Net    723 ml    Current Facility-Administered Medications  Medication Dose Route Frequency Provider Last Rate Last Dose  . 0.9 %  sodium chloride infusion  250 mL Intravenous PRN Leonie Man, MD      . 0.9 %  sodium chloride infusion   Intravenous Continuous Leonie Man, MD 75 mL/hr at 12/26/13 1055    . acetaminophen (TYLENOL) tablet 650 mg  650 mg Oral Q4H PRN Larey Dresser, MD      . amLODipine (NORVASC) tablet 5 mg  5 mg Oral Daily Larey Dresser, MD   5 mg at 12/27/13 1004  . aspirin tablet 325 mg  325 mg Oral Daily Larey Dresser, MD   325 mg at 12/27/13 1002  . atorvastatin (LIPITOR) tablet 20 mg  20 mg Oral Daily Larey Dresser, MD   20 mg at 12/27/13 1002  . cyclobenzaprine (FLEXERIL) tablet 10 mg  10 mg Oral TID PRN Larey Dresser, MD      . enoxaparin (LOVENOX) injection 40 mg  40 mg Subcutaneous Q24H Larey Dresser, MD      . hydrochlorothiazide (HYDRODIURIL) tablet 25 mg  25 mg Oral Daily Larey Dresser, MD   25 mg at 12/27/13 1002  . losartan (COZAAR) tablet 100 mg  100 mg Oral Daily Larey Dresser, MD   100 mg at 12/27/13 1002  . metoprolol (LOPRESSOR) tablet 50 mg  50 mg Oral BID Larey Dresser, MD   50 mg at 12/27/13 1002  . nitroGLYCERIN (NITROSTAT) SL tablet 0.4 mg  0.4 mg Sublingual Q5 min PRN Larey Dresser, MD      . ondansetron [facility]) injection 4 mg  4 mg Intravenous Q6H PRN Larey Dresser, MD      . sodium chloride 0.9 % injection 3 mL  3 mL Intravenous Q12H Leonie Man, MD   3 mL at 12/27/13 1005  . sodium chloride 0.9 % injection 3 mL  3 mL Intravenous PRN Leonie Man, MD        Filed Vitals:   12/26/13 2100 12/26/13 2230 12/27/13 0600 12/27/13 1002  BP:  136/63  128/66 136/63  Pulse: 59 61 50 58  Temp: 97.7 F (36.5 C)  97.7 F (36.5 C)   TempSrc:      Resp: 18 18 18    Height:      Weight:   242 lb 12.8 oz (110.133 kg)   SpO2: 96% 98% 96%     PHYSICAL EXAM General: NAD Neck: No JVD, no thyromegaly.  Lungs: Clear to auscultation bilaterally with normal respiratory effort. CV: Nondisplaced PMI.  Regular rate and rhythm, normal S1/S2, no S3/S4, no murmur.  No pretibial edema.  No carotid bruit.  Normal pedal pulses.  Abdomen: Soft, nontender, no hepatosplenomegaly, no distention.  Neurologic: Alert and oriented x 3.  Psych: Normal affect. Extremities: No clubbing or cyanosis.   TELEMETRY: Reviewed telemetry pt in sinus bradycardia/sinus rhythm.  LABS: Basic Metabolic Panel:  Recent Labs  12/26/13 1104 12/26/13 2101 12/27/13 0418  NA 138  --  139  K 3.8  --  4.1  CL 98  --  100  CO2 23  --  26  GLUCOSE 80  --  81  BUN 11  --  16  CREATININE 0.77 0.83 0.86  CALCIUM 9.0  --  8.7   Liver Function Tests: No results found for this basename: AST, ALT, ALKPHOS, BILITOT, PROT, ALBUMIN,  in the last 72 hours No results found for this basename: LIPASE, AMYLASE,  in the last 72 hours CBC:  Recent Labs  12/26/13 2101 12/27/13 0418  WBC 5.3 7.0  HGB 16.6 15.0  HCT 49.0 45.3  MCV 93.3 92.3  PLT 195 171   Cardiac Enzymes: No results found for this basename: CKTOTAL, CKMB, CKMBINDEX, TROPONINI,  in the last 72 hours BNP: No components found with this basename: POCBNP,  D-Dimer: No results found for this basename: DDIMER,  in the last 72 hours Hemoglobin A1C:  Recent Labs  12/26/13 2101  HGBA1C 5.5   Fasting Lipid Panel:  Recent Labs  12/27/13 0418  CHOL 146  HDL 49  LDLCALC 79  TRIG 90  CHOLHDL 3.0   Thyroid Function Tests:  Recent Labs  12/26/13 2101  TSH 1.850   Anemia Panel: No results found for this basename: VITAMINB12, FOLATE, FERRITIN, TIBC, IRON, RETICCTPCT,  in the last 72  hours  RADIOLOGY: No results found.    ASSESSMENT AND PLAN: 1. Severe complex CAD with LMCA-LAD lesion along with LCx disease: Awaiting CABG after Plavix washout. Asymptomatic. Continue current medical therapy which includes ASA, beta blocker, and statin. 2. HTN: Well controlled on current therapy which includes amlodipine, losartan, and HCTZ.   Kate Sable, M.D., F.A.C.C.

## 2013-12-27 NOTE — Progress Notes (Signed)
  Echocardiogram 2D Echocardiogram has been performed.  [name], Matthew FRANCES 12/27/2013, 3:45 PM

## 2013-12-27 NOTE — Progress Notes (Signed)
Incentive spirometer taken to room and pt instructed how to use, why and importance. Cardiac surgery book taken to wife for review. Questions encouraged

## 2013-12-27 NOTE — Progress Notes (Signed)
1 Day Post-Op Procedure(s) (LRB): LEFT HEART CATHETERIZATION WITH CORONARY ANGIOGRAM (N/A) Subjective: Patient remains stable without angina Platelet function severely inhibited according to P2 Y. 12 assay today Continue Plavix washout until surgery early next week Start IV heparin per pharmacy tomorrow until surgery Carotid duplex exam negative, urinalysis negative, PFTs pending Chest x-ray clear Objective: Vital signs in last 24 hours: Temp:  [97.7 F (36.5 C)-97.8 F (36.6 C)] 97.8 F (36.6 C) (06/27 1348) Pulse Rate:  [39-63] 59 (06/27 1348) Cardiac Rhythm:  [-] Normal sinus rhythm;Sinus bradycardia (06/27 1058) Resp:  [18] 18 (06/27 1348) BP: (119-136)/(63-78) 124/66 mmHg (06/27 1348) SpO2:  [96 %-98 %] 96 % (06/27 1348) Weight:  [242 lb 12.8 oz (110.133 kg)] 242 lb 12.8 oz (110.133 kg) (06/27 0600)  Hemodynamic parameters for last 24 hours:   afebrile sinus rhythm  Intake/Output from previous day: 06/26 0701 - 06/27 0700 In: 480 [P.O.:480] Out: -  Intake/Output this shift: Total I/O In: 243 [P.O.:240; I.V.:3] Out: -   Patient is alert and comfortable somewhat frustrated No bleeding  from cardiac cath site  Lab Results:  Recent Labs  12/26/13 2101 12/27/13 0418  WBC 5.3 7.0  HGB 16.6 15.0  HCT 49.0 45.3  PLT 195 171   BMET:  Recent Labs  12/26/13 1104 12/26/13 2101 12/27/13 0418  NA 138  --  139  K 3.8  --  4.1  CL 98  --  100  CO2 23  --  26  GLUCOSE 80  --  81  BUN 11  --  16  CREATININE 0.77 0.83 0.86  CALCIUM 9.0  --  8.7    PT/INR:  Recent Labs  12/26/13 2100  LABPROT 13.6  INR 1.04   ABG    Component Value Date/Time   PHART 7.433 12/26/2013 1840   HCO3 25.4* 12/26/2013 1840   TCO2 26.6 12/26/2013 1840   O2SAT 95.3 12/26/2013 1840   CBG (last 3)  No results found for this basename: GLUCAP,  in the last 72 hours  Assessment/Plan: S/P Procedure(s) (LRB): LEFT HEART CATHETERIZATION WITH CORONARY ANGIOGRAM (N/A) Recheck P2 Y. 12  assay tomorrow   LOS: 1 day    [name],[name] 12/27/2013

## 2013-12-28 ENCOUNTER — Encounter (HOSPITAL_COMMUNITY): Payer: Self-pay | Admitting: *Deleted

## 2013-12-28 DIAGNOSIS — D6959 Other secondary thrombocytopenia: Secondary | ICD-10-CM

## 2013-12-28 DIAGNOSIS — T45515A Adverse effect of anticoagulants, initial encounter: Secondary | ICD-10-CM

## 2013-12-28 LAB — PLATELET INHIBITION P2Y12: Platelet Function  P2Y12: 86 [PRU] — ABNORMAL LOW (ref 194–418)

## 2013-12-28 NOTE — Progress Notes (Signed)
Pt places self on home cpap. Will monitor 

## 2013-12-28 NOTE — Progress Notes (Signed)
2 Days Post-Op Procedure(s) (LRB): LEFT HEART CATHETERIZATION WITH CORONARY ANGIOGRAM (N/A) Subjective: Unstable angina after recurrent CAD following multivessel PCI including left main stenosis Stable without angina during Plavix washout P2 Y. 12 level slightly improved to 85-we'll recheck in a.m. CABG scheduled for Tuesday pending platelet function assay  Objective: Vital signs in last 24 hours: Temp:  [97.3 F (36.3 C)-97.9 F (36.6 C)] 97.3 F (36.3 C) (06/28 0613) Pulse Rate:  [54-62] 54 (06/28 8921) Cardiac Rhythm:  [-] Sinus bradycardia;Normal sinus rhythm (06/28 0841) Resp:  [18] 18 (06/28 0613) BP: (119-135)/(66-75) 119/71 mmHg (06/28 0613) SpO2:  [96 %-98 %] 98 % (06/28 0613)  Hemodynamic parameters for last 24 hours:   stable sinus rhythm  Intake/Output from previous day: 06/27 0701 - 06/28 0700 In: 243 [P.O.:240; I.V.:3] Out: -  Intake/Output this shift: Total I/O In: 240 [P.O.:240] Out: -   No bleeding from cath site  Lab Results:  Recent Labs  12/26/13 2101 12/27/13 0418  WBC 5.3 7.0  HGB 16.6 15.0  HCT 49.0 45.3  PLT 195 171   BMET:  Recent Labs  12/26/13 1104 12/26/13 2101 12/27/13 0418  NA 138  --  139  K 3.8  --  4.1  CL 98  --  100  CO2 23  --  26  GLUCOSE 80  --  81  BUN 11  --  16  CREATININE 0.77 0.83 0.86  CALCIUM 9.0  --  8.7    PT/INR:  Recent Labs  12/26/13 2100  LABPROT 13.6  INR 1.04   ABG    Component Value Date/Time   PHART 7.433 12/26/2013 1840   HCO3 25.4* 12/26/2013 1840   TCO2 26.6 12/26/2013 1840   O2SAT 95.3 12/26/2013 1840   CBG (last 3)  No results found for this basename: GLUCAP,  in the last 72 hours  Assessment/Plan: S/P Procedure(s) (LRB): LEFT HEART CATHETERIZATION WITH CORONARY ANGIOGRAM (N/A) Multivessel CABG tentatively Tuesday, June 30   LOS: 2 days    VAN TRIGT III,Lankford Gutzmer 12/28/2013

## 2013-12-28 NOTE — Progress Notes (Signed)
HR sustained mid 40's while sleeping asymptomatic

## 2013-12-28 NOTE — Progress Notes (Signed)
SUBJECTIVE: Pt ambulating hallways without chest pain or shortness of breath. P2Y12 up to 86 from 42. Echo demonstrated normal LV systolic function, EF 26-71%, with grade 2 diastolic dysfunction.     Intake/Output Summary (Last 24 hours) at 12/28/13 0940 Last data filed at 12/28/13 0854  Gross per 24 hour  Intake    243 ml  Output      0 ml  Net    243 ml    Current Facility-Administered Medications  Medication Dose Route Frequency Provider Last Rate Last Dose  . 0.9 %  sodium chloride infusion  250 mL Intravenous PRN Leonie Man, MD      . 0.9 %  sodium chloride infusion   Intravenous Continuous Leonie Man, MD 75 mL/hr at 12/26/13 1055    . acetaminophen (TYLENOL) tablet 1,000 mg  1,000 mg Oral BID Lendon Colonel, NP   1,000 mg at 12/27/13 2315  . acetaminophen (TYLENOL) tablet 650 mg  650 mg Oral Q4H PRN Larey Dresser, MD      . amLODipine (NORVASC) tablet 5 mg  5 mg Oral Daily Larey Dresser, MD   5 mg at 12/27/13 1004  . aspirin tablet 325 mg  325 mg Oral Daily Larey Dresser, MD   325 mg at 12/27/13 1002  . atorvastatin (LIPITOR) tablet 20 mg  20 mg Oral Daily Larey Dresser, MD   20 mg at 12/27/13 1002  . cyclobenzaprine (FLEXERIL) tablet 10 mg  10 mg Oral TID PRN Larey Dresser, MD      . enoxaparin (LOVENOX) injection 40 mg  40 mg Subcutaneous Q24H Larey Dresser, MD   40 mg at 12/27/13 1801  . hydrochlorothiazide (HYDRODIURIL) tablet 25 mg  25 mg Oral Daily Larey Dresser, MD   25 mg at 12/27/13 1002  . losartan (COZAAR) tablet 100 mg  100 mg Oral Daily Larey Dresser, MD   100 mg at 12/27/13 1002  . metoprolol (LOPRESSOR) tablet 50 mg  50 mg Oral BID Larey Dresser, MD   50 mg at 12/27/13 2316  . nitroGLYCERIN (NITROSTAT) SL tablet 0.4 mg  0.4 mg Sublingual Q5 min PRN Larey Dresser, MD      . ondansetron [facility]) injection 4 mg  4 mg Intravenous Q6H PRN Larey Dresser, MD      . sodium chloride 0.9 % injection 3 mL  3 mL Intravenous Q12H  Leonie Man, MD   3 mL at 12/27/13 2316  . sodium chloride 0.9 % injection 3 mL  3 mL Intravenous PRN Leonie Man, MD        Filed Vitals:   12/27/13 1002 12/27/13 1348 12/27/13 2040 12/28/13 0613  BP: 136/63 124/66 135/75 119/71  Pulse: 58 59 62 54  Temp:  97.8 F (36.6 C) 97.9 F (36.6 C) 97.3 F (36.3 C)  TempSrc:  Oral Oral Oral  Resp:  18 18 18   Height:      Weight:      SpO2:  96% 96% 98%    PHYSICAL EXAM General: NAD Neck: No JVD, no thyromegaly.  Lungs: Clear to auscultation bilaterally with normal respiratory effort. CV: Nondisplaced PMI.  Regular rate and rhythm, normal S1/S2, no S3/S4, no murmur.  No pretibial edema.  No carotid bruit.  Normal pedal pulses.  Abdomen: Soft, nontender, no hepatosplenomegaly, no distention.  Neurologic: Alert and oriented x 3.  Psych: Normal affect. Extremities: No clubbing  or cyanosis.   TELEMETRY: Reviewed telemetry pt in sinus rhythm/sinus bradycardia.  LABS: Basic Metabolic Panel:  Recent Labs  12/26/13 1104 12/26/13 2101 12/27/13 0418  NA 138  --  139  K 3.8  --  4.1  CL 98  --  100  CO2 23  --  26  GLUCOSE 80  --  81  BUN 11  --  16  CREATININE 0.77 0.83 0.86  CALCIUM 9.0  --  8.7   Liver Function Tests: No results found for this basename: AST, ALT, ALKPHOS, BILITOT, PROT, ALBUMIN,  in the last 72 hours No results found for this basename: LIPASE, AMYLASE,  in the last 72 hours CBC:  Recent Labs  12/26/13 2101 12/27/13 0418  WBC 5.3 7.0  HGB 16.6 15.0  HCT 49.0 45.3  MCV 93.3 92.3  PLT 195 171   Cardiac Enzymes: No results found for this basename: CKTOTAL, CKMB, CKMBINDEX, TROPONINI,  in the last 72 hours BNP: No components found with this basename: POCBNP,  D-Dimer: No results found for this basename: DDIMER,  in the last 72 hours Hemoglobin A1C:  Recent Labs  12/26/13 2101  HGBA1C 5.5   Fasting Lipid Panel:  Recent Labs  12/27/13 0418  CHOL 146  HDL 49  LDLCALC 79  TRIG 90    CHOLHDL 3.0   Thyroid Function Tests:  Recent Labs  12/26/13 2101  TSH 1.850   Anemia Panel: No results found for this basename: VITAMINB12, FOLATE, FERRITIN, TIBC, IRON, RETICCTPCT,  in the last 72 hours  RADIOLOGY: Dg Chest 2 View  12/27/2013   CLINICAL DATA:  CAD, hypertension  EXAM: CHEST  2 VIEW  COMPARISON:  08/29/2012  FINDINGS: Cardiomediastinal silhouette is stable. No acute infiltrate or pleural effusion. No pulmonary edema. Mild degenerative changes thoracic spine.  IMPRESSION: No active cardiopulmonary disease.   Electronically Signed   By: Lahoma Crocker M.D.   On: 12/27/2013 15:07      ASSESSMENT AND PLAN: 1. Severe complex CAD with LMCA-LAD lesion along with LCx disease: Awaiting CABG after Plavix washout. P2Y12 now 86 (previoulsy 42). Echo demonstrated normal LV systolic function, EF 86-76%, with grade 2 diastolic dysfunction. Asymptomatic. Continue current medical therapy which includes ASA, beta blocker, and statin. To be started on heparin. 2. HTN: Well controlled on current therapy which includes amlodipine, losartan, and HCTZ.  Kate Sable, M.D., F.A.C.C.

## 2013-12-29 ENCOUNTER — Inpatient Hospital Stay (HOSPITAL_COMMUNITY): Payer: BC Managed Care – PPO

## 2013-12-29 LAB — PULMONARY FUNCTION TEST
DL/VA % pred: 177 %
DL/VA: 7.85 ml/min/mmHg/L
DLCO cor % pred: 195 %
DLCO cor: 55.44 ml/min/mmHg
DLCO unc % pred: 198 %
DLCO unc: 56.05 ml/min/mmHg
FEF 25-75 Post: 3.79 L/sec
FEF 25-75 Pre: 3.23 L/sec
FEF2575-%Change-Post: 17 %
FEF2575-%Pred-Post: 150 %
FEF2575-%Pred-Pre: 128 %
FEV1-%Change-Post: 3 %
FEV1-%Pred-Post: 107 %
FEV1-%Pred-Pre: 103 %
FEV1-Post: 3.33 L
FEV1-Pre: 3.22 L
FEV1FVC-%Change-Post: 0 %
FEV1FVC-%Pred-Pre: 109 %
FEV6-%Change-Post: 5 %
FEV6-%Pred-Post: 104 %
FEV6-%Pred-Pre: 98 %
FEV6-Post: 4.1 L
FEV6-Pre: 3.9 L
FEV6FVC-%Change-Post: 0 %
FEV6FVC-%Pred-Post: 105 %
FEV6FVC-%Pred-Pre: 104 %
FVC-%Change-Post: 4 %
FVC-%Pred-Post: 98 %
FVC-%Pred-Pre: 94 %
FVC-Post: 4.1 L
FVC-Pre: 3.93 L
Post FEV1/FVC ratio: 81 %
Post FEV6/FVC ratio: 100 %
Pre FEV1/FVC ratio: 82 %
Pre FEV6/FVC Ratio: 100 %
RV % pred: 111 %
RV: 2.4 L
TLC % pred: 109 %
TLC: 7.04 L

## 2013-12-29 LAB — PREPARE RBC (CROSSMATCH)

## 2013-12-29 LAB — PLATELET INHIBITION P2Y12: Platelet Function  P2Y12: 101 [PRU] — ABNORMAL LOW (ref 194–418)

## 2013-12-29 LAB — ABO/RH: ABO/RH(D): A POS

## 2013-12-29 MED ORDER — ATORVASTATIN CALCIUM 20 MG PO TABS
20.0000 mg | ORAL_TABLET | Freq: Every day | ORAL | Status: DC
  Administered 2013-12-29 – 2014-01-02 (×4): 20 mg via ORAL
  Filled 2013-12-29 (×6): qty 1

## 2013-12-29 MED ORDER — SODIUM CHLORIDE 0.9 % IV SOLN
INTRAVENOUS | Status: AC
  Administered 2013-12-30: 1 [IU]/h via INTRAVENOUS
  Filled 2013-12-29: qty 1

## 2013-12-29 MED ORDER — BISACODYL 5 MG PO TBEC: 5.0000 mg | DELAYED_RELEASE_TABLET | Freq: Once | ORAL | Status: DC

## 2013-12-29 MED ORDER — POTASSIUM CHLORIDE 2 MEQ/ML IV SOLN
80.0000 meq | INTRAVENOUS | Status: DC
  Filled 2013-12-29: qty 40

## 2013-12-29 MED ORDER — PHENYLEPHRINE HCL 10 MG/ML IJ SOLN
30.0000 ug/min | INTRAVENOUS | Status: AC
  Administered 2013-12-30: 15 ug/min via INTRAVENOUS
  Filled 2013-12-29: qty 2

## 2013-12-29 MED ORDER — TEMAZEPAM 15 MG PO CAPS: 15.0000 mg | ORAL_CAPSULE | Freq: Once | ORAL | Status: AC | PRN

## 2013-12-29 MED ORDER — CHLORHEXIDINE GLUCONATE 4 % EX LIQD
60.0000 mL | Freq: Once | CUTANEOUS | Status: AC
  Administered 2013-12-30: 4 via TOPICAL
  Filled 2013-12-29: qty 60

## 2013-12-29 MED ORDER — BISACODYL 5 MG PO TBEC
5.0000 mg | DELAYED_RELEASE_TABLET | Freq: Once | ORAL | Status: DC
  Filled 2013-12-29: qty 1

## 2013-12-29 MED ORDER — MAGNESIUM SULFATE 50 % IJ SOLN
40.0000 meq | INTRAMUSCULAR | Status: DC
  Filled 2013-12-29: qty 10

## 2013-12-29 MED ORDER — VANCOMYCIN HCL 10 G IV SOLR
1500.0000 mg | INTRAVENOUS | Status: AC
  Administered 2013-12-30: 1500 mg via INTRAVENOUS
  Filled 2013-12-29: qty 1500

## 2013-12-29 MED ORDER — DEXTROSE 5 % IV SOLN
0.5000 ug/min | INTRAVENOUS | Status: DC
  Filled 2013-12-29: qty 4

## 2013-12-29 MED ORDER — CHLORHEXIDINE GLUCONATE 4 % EX LIQD
60.0000 mL | Freq: Once | CUTANEOUS | Status: AC
  Administered 2013-12-29: 4 via TOPICAL
  Filled 2013-12-29: qty 60

## 2013-12-29 MED ORDER — PLASMA-LYTE 148 IV SOLN
INTRAVENOUS | Status: AC
  Administered 2013-12-30: 08:00:00
  Filled 2013-12-29: qty 2.5

## 2013-12-29 MED ORDER — ALBUTEROL SULFATE (2.5 MG/3ML) 0.083% IN NEBU
2.5000 mg | INHALATION_SOLUTION | Freq: Once | RESPIRATORY_TRACT | Status: AC
  Administered 2013-12-29: 2.5 mg via RESPIRATORY_TRACT

## 2013-12-29 MED ORDER — CEFUROXIME SODIUM 750 MG IJ SOLR
750.0000 mg | INTRAMUSCULAR | Status: DC
  Filled 2013-12-29: qty 750

## 2013-12-29 MED ORDER — DEXTROSE 5 % IV SOLN
1.5000 g | INTRAVENOUS | Status: AC
  Administered 2013-12-30: 1500 mg via INTRAVENOUS
  Administered 2013-12-30: 750 mg via INTRAVENOUS
  Filled 2013-12-29: qty 1.5

## 2013-12-29 MED ORDER — SODIUM CHLORIDE 0.9 % IV SOLN
INTRAVENOUS | Status: DC
  Filled 2013-12-29: qty 30

## 2013-12-29 MED ORDER — NITROGLYCERIN IN D5W 200-5 MCG/ML-% IV SOLN
2.0000 ug/min | INTRAVENOUS | Status: AC
  Administered 2013-12-30: 5 ug/min via INTRAVENOUS
  Filled 2013-12-29: qty 250

## 2013-12-29 MED ORDER — TEMAZEPAM 15 MG PO CAPS: 15.0000 mg | ORAL_CAPSULE | Freq: Once | ORAL | Status: DC | PRN

## 2013-12-29 MED ORDER — METOPROLOL TARTRATE 12.5 MG HALF TABLET
12.5000 mg | ORAL_TABLET | Freq: Once | ORAL | Status: AC
  Administered 2013-12-30: 12.5 mg via ORAL
  Filled 2013-12-29: qty 1

## 2013-12-29 MED ORDER — DOPAMINE-DEXTROSE 3.2-5 MG/ML-% IV SOLN
2.0000 ug/kg/min | INTRAVENOUS | Status: DC
  Filled 2013-12-29: qty 250

## 2013-12-29 MED ORDER — SODIUM CHLORIDE 0.9 % IV SOLN
INTRAVENOUS | Status: AC
  Administered 2013-12-30: 69.8 mL/h via INTRAVENOUS
  Administered 2013-12-30: 13:00:00 via INTRAVENOUS
  Filled 2013-12-29 (×2): qty 40

## 2013-12-29 MED ORDER — ALPRAZOLAM 0.25 MG PO TABS
0.2500 mg | ORAL_TABLET | ORAL | Status: DC | PRN
  Administered 2013-12-29: 0.5 mg via ORAL
  Filled 2013-12-29: qty 2

## 2013-12-29 MED ORDER — DEXMEDETOMIDINE HCL IN NACL 400 MCG/100ML IV SOLN
0.1000 ug/kg/h | INTRAVENOUS | Status: AC
  Administered 2013-12-30: 0.3 ug/kg/h via INTRAVENOUS
  Filled 2013-12-29: qty 100

## 2013-12-29 NOTE — Progress Notes (Signed)
3 Days Post-Op Procedure(s) (LRB): LEFT HEART CATHETERIZATION WITH CORONARY ANGIOGRAM (N/A) Subjective: No angina with plavix washout P2Y12 slowly increasing- > 100  Objective: Vital signs in last 24 hours: Temp:  [97.3 F (36.3 C)-97.7 F (36.5 C)] 97.3 F (36.3 C) (06/29 0528) Pulse Rate:  [50-63] 50 (06/29 0528) Cardiac Rhythm:  [-] Normal sinus rhythm (06/29 0809) Resp:  [18-19] 18 (06/29 0528) BP: (112-135)/(62-82) 112/72 mmHg (06/29 0528) SpO2:  [95 %-97 %] 97 % (06/29 0528) Weight:  [242 lb 8.1 oz (110 kg)] 242 lb 8.1 oz (110 kg) (06/29 0528)  Hemodynamic parameters for last 24 hours:  nsr  Intake/Output from previous day: 06/28 0701 - 06/29 0700 In: 243 [P.O.:240; I.V.:3] Out: -  Intake/Output this shift: Total I/O In: 480 [P.O.:480] Out: -   Exam Alert, comfortable No bleeding from cath site  Lab Results:  Recent Labs  12/26/13 2101 12/27/13 0418  WBC 5.3 7.0  HGB 16.6 15.0  HCT 49.0 45.3  PLT 195 171   BMET:  Recent Labs  12/26/13 2101 12/27/13 0418  NA  --  139  K  --  4.1  CL  --  100  CO2  --  26  GLUCOSE  --  81  BUN  --  16  CREATININE 0.83 0.86  CALCIUM  --  8.7    PT/INR:  Recent Labs  12/26/13 2100  LABPROT 13.6  INR 1.04   ABG    Component Value Date/Time   PHART 7.433 12/26/2013 1840   HCO3 25.4* 12/26/2013 1840   TCO2 26.6 12/26/2013 1840   O2SAT 95.3 12/26/2013 1840   CBG (last 3)  No results found for this basename: GLUCAP,  in the last 72 hours  Assessment/Plan: S/P Procedure(s) (LRB): LEFT HEART CATHETERIZATION WITH CORONARY ANGIOGRAM (N/A) Will proceed with CABG tomorrow after 4 days of plavix washout-patient understands he may require blood product transfusion during or after surgery and agrees to proceed.   LOS: 3 days    VAN TRIGT III,PETER 12/29/2013

## 2013-12-29 NOTE — Progress Notes (Signed)
1100-1140 Cardiac Rehab Completed pre-op education with pt. He has pre-op heart surgery booklet and states that he has been reading it. I gave him the surgery pt care guide. I answered his questions. We discussed sternal precautions, use of IS and walking post-op. He is using IS and can set it up to the top. Pt is ambulating independently in hall. We will follow pt post-op as ordered. Deon Pilling, RN 12/29/2013 11:44 AM

## 2013-12-29 NOTE — Progress Notes (Signed)
Patient has home CPAP.  Stated that he would wear tonight and did not need any assistance.  Was told if he had any complications to call RT.

## 2013-12-29 NOTE — Progress Notes (Signed)
Subjective: NO CP  No SOB Objective: Filed Vitals:   12/28/13 1100 12/28/13 1335 12/28/13 2138 12/29/13 0528  BP: 120/80 120/62 135/82 112/72  Pulse: 60 50 63 50  Temp:  97.3 F (36.3 C) 97.7 F (36.5 C) 97.3 F (36.3 C)  TempSrc:  Oral Oral Oral  Resp:  19 18 18   Height:      Weight:    242 lb 8.1 oz (110 kg)  SpO2:  95% 95% 97%   Weight change:   Intake/Output Summary (Last 24 hours) at 12/29/13 0948 Last data filed at 12/29/13 0900  Gross per 24 hour  Intake    483 ml  Output      0 ml  Net    483 ml    General: Alert, awake, oriented x3, in no acute distress Neck:  JVP is normal Heart: Regular rate and rhythm, without murmurs, rubs, gallops.  Lungs: Clear to auscultation.  No rales or wheezes. Exemities:  No edema.   Neuro: Grossly intact, nonfocal.  Tel:  SB  Lab Results: Results for orders placed during the hospital encounter of 12/26/13 (from the past 24 hour(s))  PLATELET INHIBITION P2Y12     Status: Abnormal   Collection Time    12/29/13  4:45 AM      Result Value Ref Range   Platelet Function  P2Y12 101 (*) 194 - 418 PRU    Studies/Results: No results found.  Medications: Reviewed   @PROBHOSP @  1,  CAD  3V dz with normal  Plan for surgery tomorrow  Note platelet function improved this AM    LOS: 3 days   Dorris Carnes 12/29/2013, 9:48 AM

## 2013-12-30 ENCOUNTER — Encounter (HOSPITAL_COMMUNITY): Payer: Self-pay | Admitting: Certified Registered Nurse Anesthetist

## 2013-12-30 ENCOUNTER — Inpatient Hospital Stay (HOSPITAL_COMMUNITY): Payer: BC Managed Care – PPO

## 2013-12-30 ENCOUNTER — Encounter (HOSPITAL_COMMUNITY)
Admission: RE | Disposition: A | Discharge status: Home / Self Care | Payer: BC Managed Care – PPO | Source: Ambulatory Visit | Attending: Cardiothoracic Surgery

## 2013-12-30 ENCOUNTER — Inpatient Hospital Stay (HOSPITAL_COMMUNITY): Payer: BC Managed Care – PPO | Admitting: Anesthesiology

## 2013-12-30 ENCOUNTER — Encounter (HOSPITAL_COMMUNITY): Payer: BC Managed Care – PPO | Admitting: Anesthesiology

## 2013-12-30 DIAGNOSIS — I2510 Atherosclerotic heart disease of native coronary artery without angina pectoris: Secondary | ICD-10-CM

## 2013-12-30 DIAGNOSIS — Z951 Presence of aortocoronary bypass graft: Secondary | ICD-10-CM

## 2013-12-30 HISTORY — PX: CORONARY ARTERY BYPASS GRAFT: SHX141

## 2013-12-30 LAB — POCT I-STAT 3, ART BLOOD GAS (G3+)
ACID-BASE DEFICIT: 1 mmol/L (ref 0.0–2.0)
ACID-BASE DEFICIT: 2 mmol/L (ref 0.0–2.0)
ACID-BASE EXCESS: 1 mmol/L (ref 0.0–2.0)
ACID-BASE EXCESS: 2 mmol/L (ref 0.0–2.0)
Acid-Base Excess: 1 mmol/L (ref 0.0–2.0)
Acid-Base Excess: 1 mmol/L (ref 0.0–2.0)
Acid-Base Excess: 1 mmol/L (ref 0.0–2.0)
BICARBONATE: 25.5 meq/L — AB (ref 20.0–24.0)
BICARBONATE: 22.2 meq/L (ref 20.0–24.0)
Bicarbonate: 27.5 mEq/L — ABNORMAL HIGH (ref 20.0–24.0)
Bicarbonate: 26.3 mEq/L — ABNORMAL HIGH (ref 20.0–24.0)
Bicarbonate: 26.4 mEq/L — ABNORMAL HIGH (ref 20.0–24.0)
Bicarbonate: 26 mEq/L — ABNORMAL HIGH (ref 20.0–24.0)
Bicarbonate: 24.4 mEq/L — ABNORMAL HIGH (ref 20.0–24.0)
O2 SAT: 91 %
O2 Saturation: 100 %
O2 Saturation: 100 %
O2 Saturation: 79 %
O2 Saturation: 94 %
O2 Saturation: 97 %
O2 Saturation: 94 %
PCO2 ART: 44.4 mmHg (ref 35.0–45.0)
PCO2 ART: 38.8 mmHg (ref 35.0–45.0)
PO2 ART: 44 mmHg — AB (ref 80.0–100.0)
Patient temperature: 37
Patient temperature: 37.4
TCO2: 29 mmol/L (ref 0–100)
TCO2: 28 mmol/L (ref 0–100)
TCO2: 28 mmol/L (ref 0–100)
TCO2: 27 mmol/L (ref 0–100)
TCO2: 27 mmol/L (ref 0–100)
TCO2: 26 mmol/L (ref 0–100)
TCO2: 23 mmol/L (ref 0–100)
pCO2 arterial: 49.5 mmHg — ABNORMAL HIGH (ref 35.0–45.0)
pCO2 arterial: 39.7 mmHg (ref 35.0–45.0)
pCO2 arterial: 41.8 mmHg (ref 35.0–45.0)
pCO2 arterial: 40.6 mmHg (ref 35.0–45.0)
pCO2 arterial: 37.4 mmHg (ref 35.0–45.0)
pH, Arterial: 7.353 (ref 7.350–7.450)
pH, Arterial: 7.38 (ref 7.350–7.450)
pH, Arterial: 7.431 (ref 7.350–7.450)
pH, Arterial: 7.426 (ref 7.350–7.450)
pH, Arterial: 7.401 (ref 7.350–7.450)
pH, Arterial: 7.386 (ref 7.350–7.450)
pH, Arterial: 7.383 (ref 7.350–7.450)
pO2, Arterial: 244 mmHg — ABNORMAL HIGH (ref 80.0–100.0)
pO2, Arterial: 497 mmHg — ABNORMAL HIGH (ref 80.0–100.0)
pO2, Arterial: 59 mmHg — ABNORMAL LOW (ref 80.0–100.0)
pO2, Arterial: 71 mmHg — ABNORMAL LOW (ref 80.0–100.0)
pO2, Arterial: 91 mmHg (ref 80.0–100.0)
pO2, Arterial: 74 mmHg — ABNORMAL LOW (ref 80.0–100.0)

## 2013-12-30 LAB — POCT I-STAT, CHEM 8
BUN: 15 mg/dL (ref 6–23)
CALCIUM ION: 1.15 mmol/L (ref 1.13–1.30)
CHLORIDE: 102 meq/L (ref 96–112)
Creatinine, Ser: 0.8 mg/dL (ref 0.50–1.35)
GLUCOSE: 151 mg/dL — AB (ref 70–99)
HCT: 45 % (ref 39.0–52.0)
Hemoglobin: 15.3 g/dL (ref 13.0–17.0)
POTASSIUM: 3.5 meq/L — AB (ref 3.7–5.3)
Sodium: 142 mEq/L (ref 137–147)
TCO2: 22 mmol/L (ref 0–100)

## 2013-12-30 LAB — CBC
HCT: 51.1 % (ref 39.0–52.0)
HCT: 42.8 % (ref 39.0–52.0)
HCT: 42.7 % (ref 39.0–52.0)
Hemoglobin: 17.4 g/dL — ABNORMAL HIGH (ref 13.0–17.0)
Hemoglobin: 14.4 g/dL (ref 13.0–17.0)
Hemoglobin: 14.4 g/dL (ref 13.0–17.0)
MCH: 31.7 pg (ref 26.0–34.0)
MCH: 31.2 pg (ref 26.0–34.0)
MCH: 31.3 pg (ref 26.0–34.0)
MCHC: 34.1 g/dL (ref 30.0–36.0)
MCHC: 33.6 g/dL (ref 30.0–36.0)
MCHC: 33.7 g/dL (ref 30.0–36.0)
MCV: 93.1 fL (ref 78.0–100.0)
MCV: 92.8 fL (ref 78.0–100.0)
MCV: 92.8 fL (ref 78.0–100.0)
PLATELETS: 149 10*3/uL — AB (ref 150–400)
Platelets: 190 10*3/uL (ref 150–400)
Platelets: 170 10*3/uL (ref 150–400)
RBC: 5.49 MIL/uL (ref 4.22–5.81)
RBC: 4.61 MIL/uL (ref 4.22–5.81)
RBC: 4.6 MIL/uL (ref 4.22–5.81)
RDW: 13.5 % (ref 11.5–15.5)
RDW: 13.4 % (ref 11.5–15.5)
RDW: 13.3 % (ref 11.5–15.5)
WBC: 6.6 10*3/uL (ref 4.0–10.5)
WBC: 11 10*3/uL — AB (ref 4.0–10.5)
WBC: 13.2 10*3/uL — ABNORMAL HIGH (ref 4.0–10.5)

## 2013-12-30 LAB — POCT I-STAT 4, (NA,K, GLUC, HGB,HCT)
GLUCOSE: 105 mg/dL — AB (ref 70–99)
GLUCOSE: 127 mg/dL — AB (ref 70–99)
GLUCOSE: 139 mg/dL — AB (ref 70–99)
Glucose, Bld: 114 mg/dL — ABNORMAL HIGH (ref 70–99)
Glucose, Bld: 102 mg/dL — ABNORMAL HIGH (ref 70–99)
Glucose, Bld: 159 mg/dL — ABNORMAL HIGH (ref 70–99)
HCT: 52 % (ref 39.0–52.0)
HCT: 41 % (ref 39.0–52.0)
HEMATOCRIT: 55 % — AB (ref 39.0–52.0)
HEMATOCRIT: 32 % — AB (ref 39.0–52.0)
HEMATOCRIT: 38 % — AB (ref 39.0–52.0)
HEMATOCRIT: 41 % (ref 39.0–52.0)
HEMOGLOBIN: 18.7 g/dL — AB (ref 13.0–17.0)
HEMOGLOBIN: 10.9 g/dL — AB (ref 13.0–17.0)
HEMOGLOBIN: 12.9 g/dL — AB (ref 13.0–17.0)
Hemoglobin: 17.7 g/dL — ABNORMAL HIGH (ref 13.0–17.0)
Hemoglobin: 13.9 g/dL (ref 13.0–17.0)
Hemoglobin: 13.9 g/dL (ref 13.0–17.0)
POTASSIUM: 4 meq/L (ref 3.7–5.3)
POTASSIUM: 3.9 meq/L (ref 3.7–5.3)
POTASSIUM: 3.3 meq/L — AB (ref 3.7–5.3)
Potassium: 3.9 mEq/L (ref 3.7–5.3)
Potassium: 3.6 mEq/L — ABNORMAL LOW (ref 3.7–5.3)
Potassium: 3.3 mEq/L — ABNORMAL LOW (ref 3.7–5.3)
SODIUM: 137 meq/L (ref 137–147)
SODIUM: 136 meq/L — AB (ref 137–147)
Sodium: 137 mEq/L (ref 137–147)
Sodium: 127 mEq/L — ABNORMAL LOW (ref 137–147)
Sodium: 138 mEq/L (ref 137–147)
Sodium: 136 mEq/L — ABNORMAL LOW (ref 137–147)

## 2013-12-30 LAB — BASIC METABOLIC PANEL
BUN: 17 mg/dL (ref 6–23)
CO2: 23 mEq/L (ref 19–32)
Calcium: 9.4 mg/dL (ref 8.4–10.5)
Chloride: 97 mEq/L (ref 96–112)
Creatinine, Ser: 0.82 mg/dL (ref 0.50–1.35)
GFR calc Af Amer: >90 mL/min (ref >90)
GFR calc non Af Amer: >90 mL/min (ref >90)
Glucose, Bld: 98 mg/dL (ref 70–99)
Potassium: 3.6 mEq/L — ABNORMAL LOW (ref 3.7–5.3)
Sodium: 137 mEq/L (ref 137–147)

## 2013-12-30 LAB — PROTIME-INR
INR: 1.28 (ref 0.00–1.49)
PROTHROMBIN TIME: 16 s — AB (ref 11.6–15.2)

## 2013-12-30 LAB — HEMOGLOBIN AND HEMATOCRIT, BLOOD
HCT: 40.6 % (ref 39.0–52.0)
Hemoglobin: 13.8 g/dL (ref 13.0–17.0)

## 2013-12-30 LAB — CREATININE, SERUM
Creatinine, Ser: 0.81 mg/dL (ref 0.50–1.35)
GFR calc Af Amer: >90 mL/min (ref >90)
GFR calc non Af Amer: >90 mL/min (ref >90)

## 2013-12-30 LAB — POCT ACTIVATED CLOTTING TIME
ACTIVATED CLOTTING TIME: 118 s
Activated Clotting Time: 619 seconds
Activated Clotting Time: 95 seconds

## 2013-12-30 LAB — MAGNESIUM: Magnesium: 2.4 mg/dL (ref 1.5–2.5)

## 2013-12-30 LAB — PLATELET INHIBITION P2Y12: Platelet Function  P2Y12: 98 [PRU] — ABNORMAL LOW (ref 194–418)

## 2013-12-30 LAB — PLATELET COUNT: Platelets: 165 10*3/uL (ref 150–400)

## 2013-12-30 LAB — APTT: aPTT: 31 seconds (ref 24–37)

## 2013-12-30 SURGERY — CORONARY ARTERY BYPASS GRAFTING (CABG)
Anesthesia: General | Site: Chest

## 2013-12-30 MED ORDER — SODIUM CHLORIDE 0.9 % IV SOLN
INTRAVENOUS | Status: DC
  Filled 2013-12-30: qty 20

## 2013-12-30 MED ORDER — ALBUMIN HUMAN 5 % IV SOLN
INTRAVENOUS | Status: DC | PRN
  Administered 2013-12-30: 12:00:00 via INTRAVENOUS

## 2013-12-30 MED ORDER — PHENYLEPHRINE 40 MCG/ML (10ML) SYRINGE FOR IV PUSH (FOR BLOOD PRESSURE SUPPORT)
PREFILLED_SYRINGE | INTRAVENOUS | Status: AC
  Filled 2013-12-30: qty 10

## 2013-12-30 MED ORDER — PROTAMINE SULFATE 10 MG/ML IV SOLN
INTRAVENOUS | Status: DC | PRN
  Administered 2013-12-30: 70 mg via INTRAVENOUS
  Administered 2013-12-30: 40 mg via INTRAVENOUS
  Administered 2013-12-30 (×2): 70 mg via INTRAVENOUS
  Administered 2013-12-30: 20 mg via INTRAVENOUS

## 2013-12-30 MED ORDER — ROCURONIUM BROMIDE 100 MG/10ML IV SOLN
INTRAVENOUS | Status: DC | PRN
  Administered 2013-12-30 (×5): 50 mg via INTRAVENOUS

## 2013-12-30 MED ORDER — ACETAMINOPHEN 160 MG/5ML PO SOLN
1000.0000 mg | Freq: Four times a day (QID) | ORAL | Status: DC
  Filled 2013-12-30: qty 40

## 2013-12-30 MED ORDER — ACETAMINOPHEN 650 MG RE SUPP
650.0000 mg | Freq: Once | RECTAL | Status: AC
  Administered 2013-12-30: 650 mg via RECTAL

## 2013-12-30 MED ORDER — GELATIN ABSORBABLE MT POWD
OROMUCOSAL | Status: DC | PRN
  Administered 2013-12-30 (×3): via TOPICAL

## 2013-12-30 MED ORDER — METOPROLOL TARTRATE 12.5 MG HALF TABLET
12.5000 mg | ORAL_TABLET | Freq: Two times a day (BID) | ORAL | Status: DC
  Administered 2013-12-31 (×2): 12.5 mg via ORAL
  Filled 2013-12-30 (×5): qty 1

## 2013-12-30 MED ORDER — SODIUM CHLORIDE 0.9 % IV SOLN
20.0000 ug | INTRAVENOUS | Status: DC | PRN
  Administered 2013-12-30: 20 ug via INTRAVENOUS

## 2013-12-30 MED ORDER — EPHEDRINE SULFATE 50 MG/ML IJ SOLN
INTRAMUSCULAR | Status: AC
  Filled 2013-12-30: qty 1

## 2013-12-30 MED ORDER — FAMOTIDINE IN NACL 20-0.9 MG/50ML-% IV SOLN
20.0000 mg | Freq: Two times a day (BID) | INTRAVENOUS | Status: AC
  Administered 2013-12-30 (×2): 20 mg via INTRAVENOUS
  Filled 2013-12-30: qty 50

## 2013-12-30 MED ORDER — ARTIFICIAL TEARS OP OINT
TOPICAL_OINTMENT | OPHTHALMIC | Status: DC | PRN
  Administered 2013-12-30: 1 via OPHTHALMIC

## 2013-12-30 MED ORDER — DEXMEDETOMIDINE HCL IN NACL 200 MCG/50ML IV SOLN
0.1000 ug/kg/h | INTRAVENOUS | Status: DC
  Administered 2013-12-30: 0.7 ug/kg/h via INTRAVENOUS
  Filled 2013-12-30: qty 50

## 2013-12-30 MED ORDER — OXYCODONE HCL 5 MG PO TABS
5.0000 mg | ORAL_TABLET | ORAL | Status: DC | PRN
  Administered 2013-12-30: 5 mg via ORAL
  Administered 2013-12-31 – 2014-01-01 (×8): 10 mg via ORAL
  Filled 2013-12-30 (×8): qty 2
  Filled 2013-12-30: qty 1

## 2013-12-30 MED ORDER — DEXTROSE 5 % IV SOLN
0.0000 ug/min | INTRAVENOUS | Status: DC
  Filled 2013-12-30: qty 2

## 2013-12-30 MED ORDER — PROTAMINE SULFATE 10 MG/ML IV SOLN
INTRAVENOUS | Status: AC
  Filled 2013-12-30: qty 5

## 2013-12-30 MED ORDER — ALBUMIN HUMAN 5 % IV SOLN: 250.0000 mL | INTRAVENOUS | Status: AC | PRN

## 2013-12-30 MED ORDER — SODIUM CHLORIDE 0.9 % IV SOLN
INTRAVENOUS | Status: DC | PRN
  Administered 2013-12-30: 13:00:00 via INTRAVENOUS

## 2013-12-30 MED ORDER — POTASSIUM CHLORIDE 10 MEQ/50ML IV SOLN
10.0000 meq | INTRAVENOUS | Status: AC
  Administered 2013-12-30 (×3): 10 meq via INTRAVENOUS

## 2013-12-30 MED ORDER — MIDAZOLAM HCL 2 MG/2ML IJ SOLN
INTRAMUSCULAR | Status: AC
  Filled 2013-12-30: qty 2

## 2013-12-30 MED ORDER — SODIUM CHLORIDE 0.9 % IV SOLN
20.0000 ug | Freq: Once | INTRAVENOUS | Status: DC
  Filled 2013-12-30: qty 5

## 2013-12-30 MED ORDER — MUPIROCIN 2 % EX OINT
1.0000 "application " | TOPICAL_OINTMENT | Freq: Two times a day (BID) | CUTANEOUS | Status: DC
  Administered 2013-12-30: 1 via NASAL
  Filled 2013-12-30 (×2): qty 22

## 2013-12-30 MED ORDER — ROCURONIUM BROMIDE 50 MG/5ML IV SOLN
INTRAVENOUS | Status: AC
  Filled 2013-12-30: qty 4

## 2013-12-30 MED ORDER — ACETAMINOPHEN 160 MG/5ML PO SOLN: 650.0000 mg | Freq: Once | ORAL | Status: AC

## 2013-12-30 MED ORDER — HEPARIN SODIUM (PORCINE) 1000 UNIT/ML IJ SOLN
INTRAMUSCULAR | Status: DC | PRN
  Administered 2013-12-30: 5000 [IU] via INTRAVENOUS
  Administered 2013-12-30: 22000 [IU] via INTRAVENOUS

## 2013-12-30 MED ORDER — HEMOSTATIC AGENTS (NO CHARGE) OPTIME
TOPICAL | Status: DC | PRN
  Administered 2013-12-30: 1 via TOPICAL

## 2013-12-30 MED ORDER — PROPOFOL 10 MG/ML IV BOLUS
INTRAVENOUS | Status: DC | PRN
  Administered 2013-12-30: 110 mg via INTRAVENOUS

## 2013-12-30 MED ORDER — ROCURONIUM BROMIDE 50 MG/5ML IV SOLN
INTRAVENOUS | Status: AC
  Filled 2013-12-30: qty 1

## 2013-12-30 MED ORDER — SODIUM CHLORIDE 0.9 % IJ SOLN: 3.0000 mL | Freq: Two times a day (BID) | INTRAMUSCULAR | Status: DC

## 2013-12-30 MED ORDER — BISACODYL 10 MG RE SUPP: 10.0000 mg | Freq: Every day | RECTAL | Status: DC

## 2013-12-30 MED ORDER — FENTANYL CITRATE 0.05 MG/ML IJ SOLN
INTRAMUSCULAR | Status: AC
  Filled 2013-12-30: qty 5

## 2013-12-30 MED ORDER — BISACODYL 5 MG PO TBEC
10.0000 mg | DELAYED_RELEASE_TABLET | Freq: Every day | ORAL | Status: DC
Start: 2013-12-31 — End: 2014-01-03
  Administered 2013-12-31 – 2014-01-01 (×2): 10 mg via ORAL
  Filled 2013-12-30 (×2): qty 2

## 2013-12-30 MED ORDER — PROPOFOL 10 MG/ML IV BOLUS
INTRAVENOUS | Status: AC
  Filled 2013-12-30: qty 20

## 2013-12-30 MED ORDER — SODIUM CHLORIDE 0.9 % IJ SOLN
3.0000 mL | INTRAMUSCULAR | Status: DC | PRN
  Administered 2014-01-01: 3 mL via INTRAVENOUS

## 2013-12-30 MED ORDER — ACETAMINOPHEN 500 MG PO TABS
1000.0000 mg | ORAL_TABLET | Freq: Four times a day (QID) | ORAL | Status: DC
  Administered 2013-12-31 – 2014-01-03 (×12): 1000 mg via ORAL
  Filled 2013-12-30 (×19): qty 2

## 2013-12-30 MED ORDER — LACTATED RINGERS IV SOLN
INTRAVENOUS | Status: DC | PRN
  Administered 2013-12-30: 07:00:00 via INTRAVENOUS

## 2013-12-30 MED ORDER — METOPROLOL TARTRATE 25 MG/10 ML ORAL SUSPENSION
12.5000 mg | Freq: Two times a day (BID) | ORAL | Status: DC
  Filled 2013-12-30 (×5): qty 5

## 2013-12-30 MED ORDER — POTASSIUM CHLORIDE 10 MEQ/50ML IV SOLN
10.0000 meq | INTRAVENOUS | Status: AC
  Administered 2013-12-30 (×3): 10 meq via INTRAVENOUS
  Filled 2013-12-30 (×3): qty 50

## 2013-12-30 MED ORDER — MIDAZOLAM HCL 5 MG/5ML IJ SOLN
INTRAMUSCULAR | Status: DC | PRN
  Administered 2013-12-30: 5 mg via INTRAVENOUS
  Administered 2013-12-30: 6 mg via INTRAVENOUS
  Administered 2013-12-30: 5 mg via INTRAVENOUS
  Administered 2013-12-30: 1 mg via INTRAVENOUS
  Administered 2013-12-30: 3 mg via INTRAVENOUS
  Administered 2013-12-30: 2 mg via INTRAVENOUS

## 2013-12-30 MED ORDER — SUCCINYLCHOLINE CHLORIDE 20 MG/ML IJ SOLN
INTRAMUSCULAR | Status: AC
  Filled 2013-12-30: qty 1

## 2013-12-30 MED ORDER — DEXTROSE 5 % IV SOLN
1.5000 g | Freq: Two times a day (BID) | INTRAVENOUS | Status: AC
  Administered 2013-12-31 – 2014-01-01 (×4): 1.5 g via INTRAVENOUS
  Filled 2013-12-30 (×4): qty 1.5

## 2013-12-30 MED ORDER — VANCOMYCIN HCL IN DEXTROSE 1-5 GM/200ML-% IV SOLN
1000.0000 mg | Freq: Two times a day (BID) | INTRAVENOUS | Status: AC
  Administered 2013-12-30 – 2013-12-31 (×2): 1000 mg via INTRAVENOUS
  Filled 2013-12-30 (×2): qty 200

## 2013-12-30 MED ORDER — DEXMEDETOMIDINE HCL IN NACL 200 MCG/50ML IV SOLN
INTRAVENOUS | Status: AC
  Filled 2013-12-30: qty 50

## 2013-12-30 MED ORDER — PANTOPRAZOLE SODIUM 40 MG PO TBEC
40.0000 mg | DELAYED_RELEASE_TABLET | Freq: Every day | ORAL | Status: DC
  Administered 2014-01-01 – 2014-01-03 (×3): 40 mg via ORAL
  Filled 2013-12-30 (×3): qty 1

## 2013-12-30 MED ORDER — DEXTROSE 5 % IV SOLN
10.0000 mg | INTRAVENOUS | Status: DC | PRN
  Administered 2013-12-30: 20 ug/min via INTRAVENOUS

## 2013-12-30 MED ORDER — PROTAMINE SULFATE 10 MG/ML IV SOLN
INTRAVENOUS | Status: AC
  Filled 2013-12-30: qty 25

## 2013-12-30 MED ORDER — VANCOMYCIN HCL IN DEXTROSE 1-5 GM/200ML-% IV SOLN
1000.0000 mg | Freq: Once | INTRAVENOUS | Status: DC
  Filled 2013-12-30: qty 200

## 2013-12-30 MED ORDER — MORPHINE SULFATE 2 MG/ML IJ SOLN
2.0000 mg | INTRAMUSCULAR | Status: DC | PRN
  Administered 2013-12-30 – 2013-12-31 (×5): 2 mg via INTRAVENOUS
  Filled 2013-12-30 (×6): qty 1

## 2013-12-30 MED ORDER — SUCCINYLCHOLINE CHLORIDE 20 MG/ML IJ SOLN
INTRAMUSCULAR | Status: DC | PRN
  Administered 2013-12-30: 120 mg via INTRAVENOUS

## 2013-12-30 MED ORDER — MIDAZOLAM HCL 2 MG/2ML IJ SOLN: 2.0000 mg | INTRAMUSCULAR | Status: DC | PRN

## 2013-12-30 MED ORDER — MAGNESIUM SULFATE 4000MG/100ML IJ SOLN
4.0000 g | Freq: Once | INTRAMUSCULAR | Status: AC
  Administered 2013-12-30: 4 g via INTRAVENOUS
  Filled 2013-12-30: qty 100

## 2013-12-30 MED ORDER — MORPHINE SULFATE 2 MG/ML IJ SOLN
1.0000 mg | INTRAMUSCULAR | Status: AC | PRN
Start: 2013-12-30 — End: 2013-12-31
  Administered 2013-12-30 (×3): 2 mg via INTRAVENOUS
  Filled 2013-12-30 (×2): qty 1

## 2013-12-30 MED ORDER — POTASSIUM CHLORIDE 10 MEQ/50ML IV SOLN
10.0000 meq | Freq: Once | INTRAVENOUS | Status: AC
  Administered 2013-12-30: 10 meq via INTRAVENOUS

## 2013-12-30 MED ORDER — ASPIRIN EC 325 MG PO TBEC
325.0000 mg | DELAYED_RELEASE_TABLET | Freq: Every day | ORAL | Status: DC
  Administered 2013-12-31: 325 mg via ORAL
  Filled 2013-12-30 (×2): qty 1

## 2013-12-30 MED ORDER — HEPARIN SODIUM (PORCINE) 1000 UNIT/ML IJ SOLN
INTRAMUSCULAR | Status: AC
  Filled 2013-12-30: qty 1

## 2013-12-30 MED ORDER — INSULIN REGULAR BOLUS VIA INFUSION
0.0000 [IU] | Freq: Three times a day (TID) | INTRAVENOUS | Status: DC
  Filled 2013-12-30: qty 10

## 2013-12-30 MED ORDER — SODIUM CHLORIDE 0.9 % IV SOLN
INTRAVENOUS | Status: DC
  Administered 2013-12-30: 7.4 [IU]/h via INTRAVENOUS
  Filled 2013-12-30: qty 1

## 2013-12-30 MED ORDER — LACTATED RINGERS IV SOLN
INTRAVENOUS | Status: DC | PRN
  Administered 2013-12-30 (×2): via INTRAVENOUS

## 2013-12-30 MED ORDER — METOCLOPRAMIDE HCL 5 MG/ML IJ SOLN
10.0000 mg | Freq: Four times a day (QID) | INTRAMUSCULAR | Status: AC
  Administered 2013-12-30 – 2013-12-31 (×3): 10 mg via INTRAVENOUS
  Filled 2013-12-30 (×4): qty 2

## 2013-12-30 MED ORDER — METOPROLOL TARTRATE 1 MG/ML IV SOLN: 2.5000 mg | INTRAVENOUS | Status: DC | PRN

## 2013-12-30 MED ORDER — LACTATED RINGERS IV SOLN
INTRAVENOUS | Status: DC | PRN
  Administered 2013-12-30 (×2): via INTRAVENOUS

## 2013-12-30 MED ORDER — SODIUM CHLORIDE 0.9 % IV SOLN
INTRAVENOUS | Status: DC
  Administered 2013-12-30: 10 mL/h via INTRAVENOUS

## 2013-12-30 MED ORDER — LIDOCAINE HCL (CARDIAC) 20 MG/ML IV SOLN
INTRAVENOUS | Status: AC
  Filled 2013-12-30: qty 5

## 2013-12-30 MED ORDER — LACTATED RINGERS IV SOLN: 500.0000 mL | Freq: Once | INTRAVENOUS | Status: DC | PRN

## 2013-12-30 MED ORDER — MIDAZOLAM HCL 10 MG/2ML IJ SOLN
INTRAMUSCULAR | Status: AC
  Filled 2013-12-30: qty 2

## 2013-12-30 MED ORDER — ASPIRIN 81 MG PO CHEW: 324.0000 mg | CHEWABLE_TABLET | Freq: Every day | ORAL | Status: DC

## 2013-12-30 MED ORDER — ONDANSETRON HCL 4 MG/2ML IJ SOLN
4.0000 mg | Freq: Four times a day (QID) | INTRAMUSCULAR | Status: DC | PRN
  Administered 2014-01-01: 4 mg via INTRAVENOUS
  Filled 2013-12-30: qty 2

## 2013-12-30 MED ORDER — NITROGLYCERIN IN D5W 200-5 MCG/ML-% IV SOLN: 0.0000 ug/min | INTRAVENOUS | Status: DC

## 2013-12-30 MED ORDER — 0.9 % SODIUM CHLORIDE (POUR BTL) OPTIME
TOPICAL | Status: DC | PRN
  Administered 2013-12-30: 6000 mL

## 2013-12-30 MED ORDER — ARTIFICIAL TEARS OP OINT
TOPICAL_OINTMENT | OPHTHALMIC | Status: AC
Start: 2013-12-30 — End: 2013-12-30
  Filled 2013-12-30: qty 3.5

## 2013-12-30 MED ORDER — LACTATED RINGERS IV SOLN
INTRAVENOUS | Status: DC
Start: 2013-12-30 — End: 2014-01-01

## 2013-12-30 MED ORDER — FENTANYL CITRATE 0.05 MG/ML IJ SOLN
INTRAMUSCULAR | Status: DC | PRN
  Administered 2013-12-30: 250 ug via INTRAVENOUS
  Administered 2013-12-30: 50 ug via INTRAVENOUS
  Administered 2013-12-30: 100 ug via INTRAVENOUS
  Administered 2013-12-30: 150 ug via INTRAVENOUS
  Administered 2013-12-30 (×3): 100 ug via INTRAVENOUS
  Administered 2013-12-30: 150 ug via INTRAVENOUS
  Administered 2013-12-30: 50 ug via INTRAVENOUS
  Administered 2013-12-30: 100 ug via INTRAVENOUS
  Administered 2013-12-30: 150 ug via INTRAVENOUS
  Administered 2013-12-30: 200 ug via INTRAVENOUS

## 2013-12-30 MED ORDER — SODIUM CHLORIDE 0.45 % IV SOLN
INTRAVENOUS | Status: DC
  Administered 2013-12-30: 10 mL/h via INTRAVENOUS

## 2013-12-30 MED ORDER — DOCUSATE SODIUM 100 MG PO CAPS
200.0000 mg | ORAL_CAPSULE | Freq: Every day | ORAL | Status: DC
  Administered 2013-12-31 – 2014-01-02 (×3): 200 mg via ORAL
  Filled 2013-12-30 (×4): qty 2

## 2013-12-30 MED ORDER — SODIUM CHLORIDE 0.9 % IV SOLN: 250.0000 mL | INTRAVENOUS | Status: DC

## 2013-12-30 SURGICAL SUPPLY — 105 items
ADAPTER CARDIO PERF ANTE/RETRO (ADAPTER) ×4 IMPLANT
ADPR PRFSN 84XANTGRD RTRGD (ADAPTER) ×2
ATTRACTOMAT 16X20 MAGNETIC DRP (DRAPES) ×7 IMPLANT
BAG DECANTER FOR FLEXI CONT (MISCELLANEOUS) ×4 IMPLANT
BANDAGE ELASTIC 4 VELCRO ST LF (GAUZE/BANDAGES/DRESSINGS) ×4 IMPLANT
BANDAGE ELASTIC 6 VELCRO ST LF (GAUZE/BANDAGES/DRESSINGS) ×4 IMPLANT
BANDAGE GAUZE ELAST BULKY 4 IN (GAUZE/BANDAGES/DRESSINGS) ×4 IMPLANT
BASKET HEART  (ORDER IN 25'S) (MISCELLANEOUS) ×1
BASKET HEART (ORDER IN 25'S) (MISCELLANEOUS) ×1
BASKET HEART (ORDER IN 25S) (MISCELLANEOUS) ×2 IMPLANT
BLADE STERNUM SYSTEM 6 (BLADE) ×4 IMPLANT
BLADE SURG 11 STRL SS (BLADE) ×3 IMPLANT
BLADE SURG 12 STRL SS (BLADE) ×4 IMPLANT
BLADE SURG ROTATE 9660 (MISCELLANEOUS) ×3 IMPLANT
BNDG GAUZE ELAST 4 BULKY (GAUZE/BANDAGES/DRESSINGS) ×3 IMPLANT
CANISTER SUCTION 2500CC (MISCELLANEOUS) ×4 IMPLANT
CANNULA GUNDRY RCSP 15FR (MISCELLANEOUS) ×4 IMPLANT
CANNULA VENOUS LOW PROF 32X40 (CANNULA) IMPLANT
CARDIAC SUCTION (MISCELLANEOUS) ×4 IMPLANT
CATH CPB KIT VANTRIGT (MISCELLANEOUS) ×4 IMPLANT
CATH ROBINSON RED A/P 18FR (CATHETERS) ×12 IMPLANT
CATH THORACIC 36FR RT ANG (CATHETERS) ×4 IMPLANT
CLIP TI WIDE RED SMALL 24 (CLIP) ×3 IMPLANT
CLOSURE WOUND 1/2 X4 (GAUZE/BANDAGES/DRESSINGS) ×1
COVER SURGICAL LIGHT HANDLE (MISCELLANEOUS) ×4 IMPLANT
CRADLE DONUT ADULT HEAD (MISCELLANEOUS) ×4 IMPLANT
DRAIN CHANNEL 32F RND 10.7 FF (WOUND CARE) ×4 IMPLANT
DRAPE CARDIOVASCULAR INCISE (DRAPES) ×4
DRAPE PROXIMA HALF (DRAPES) ×12 IMPLANT
DRAPE SLUSH/WARMER DISC (DRAPES) ×4 IMPLANT
DRAPE SRG 135X102X78XABS (DRAPES) ×2 IMPLANT
DRSG AQUACEL AG ADV 3.5X14 (GAUZE/BANDAGES/DRESSINGS) ×4 IMPLANT
ELECT BLADE 4.0 EZ CLEAN MEGAD (MISCELLANEOUS) ×4
ELECT BLADE 6.5 EXT (BLADE) ×4 IMPLANT
ELECT CAUTERY BLADE 6.4 (BLADE) ×4 IMPLANT
ELECT REM PT RETURN 9FT ADLT (ELECTROSURGICAL) ×8
ELECTRODE BLDE 4.0 EZ CLN MEGD (MISCELLANEOUS) ×2 IMPLANT
ELECTRODE REM PT RTRN 9FT ADLT (ELECTROSURGICAL) ×4 IMPLANT
GLOVE BIO SURGEON STRL SZ 6 (GLOVE) ×6 IMPLANT
GLOVE BIO SURGEON STRL SZ7.5 (GLOVE) ×12 IMPLANT
GLOVE BIO SURGEON STRL SZ8 (GLOVE) ×3 IMPLANT
GLOVE BIOGEL PI IND STRL 6 (GLOVE) ×3 IMPLANT
GLOVE BIOGEL PI IND STRL 6.5 (GLOVE) ×3 IMPLANT
GLOVE BIOGEL PI INDICATOR 6 (GLOVE) ×6
GLOVE BIOGEL PI INDICATOR 6.5 (GLOVE) ×6
GOWN STRL REUS W/ TWL LRG LVL3 (GOWN DISPOSABLE) ×8 IMPLANT
GOWN STRL REUS W/TWL LRG LVL3 (GOWN DISPOSABLE) ×16
HEMOSTAT POWDER SURGIFOAM 1G (HEMOSTASIS) ×12 IMPLANT
HEMOSTAT SURGICEL 2X14 (HEMOSTASIS) ×7 IMPLANT
INSERT FOGARTY XLG (MISCELLANEOUS) IMPLANT
KIT BASIN OR (CUSTOM PROCEDURE TRAY) ×4 IMPLANT
KIT ROOM TURNOVER OR (KITS) ×4 IMPLANT
KIT SUCTION CATH 14FR (SUCTIONS) ×4 IMPLANT
KIT VASOVIEW W/TROCAR VH 2000 (KITS) ×4 IMPLANT
LEAD PACING MYOCARDI (MISCELLANEOUS) ×4 IMPLANT
MARKER GRAFT CORONARY BYPASS (MISCELLANEOUS) ×12 IMPLANT
NS IRRIG 1000ML POUR BTL (IV SOLUTION) ×23 IMPLANT
PACK OPEN HEART (CUSTOM PROCEDURE TRAY) ×4 IMPLANT
PAD ARMBOARD 7.5X6 YLW CONV (MISCELLANEOUS) ×8 IMPLANT
PAD ELECT DEFIB RADIOL ZOLL (MISCELLANEOUS) ×4 IMPLANT
PENCIL BUTTON HOLSTER BLD 10FT (ELECTRODE) ×4 IMPLANT
PUNCH AORTIC ROTATE 4.0MM (MISCELLANEOUS) IMPLANT
PUNCH AORTIC ROTATE 4.5MM 8IN (MISCELLANEOUS) IMPLANT
PUNCH AORTIC ROTATE 5MM 8IN (MISCELLANEOUS) IMPLANT
SPONGE GAUZE 4X4 12PLY (GAUZE/BANDAGES/DRESSINGS) ×8 IMPLANT
SPONGE GAUZE 4X4 12PLY STER LF (GAUZE/BANDAGES/DRESSINGS) ×6 IMPLANT
SPONGE LAP 18X18 X RAY DECT (DISPOSABLE) ×6 IMPLANT
STRIP CLOSURE SKIN 1/2X4 (GAUZE/BANDAGES/DRESSINGS) ×2 IMPLANT
SURGIFLO W/THROMBIN 8M KIT (HEMOSTASIS) ×4 IMPLANT
SUT BONE WAX W31G (SUTURE) ×4 IMPLANT
SUT MNCRL AB 4-0 PS2 18 (SUTURE) ×3 IMPLANT
SUT PROLENE 3 0 SH DA (SUTURE) ×12 IMPLANT
SUT PROLENE 3 0 SH1 36 (SUTURE) IMPLANT
SUT PROLENE 4 0 RB 1 (SUTURE) ×4
SUT PROLENE 4 0 SH DA (SUTURE) ×4 IMPLANT
SUT PROLENE 4-0 RB1 .5 CRCL 36 (SUTURE) ×2 IMPLANT
SUT PROLENE 5 0 C 1 36 (SUTURE) IMPLANT
SUT PROLENE 6 0 C 1 24 (SUTURE) ×3 IMPLANT
SUT PROLENE 6 0 C 1 30 (SUTURE) ×3 IMPLANT
SUT PROLENE 6 0 CC (SUTURE) ×12 IMPLANT
SUT PROLENE 8 0 BV175 6 (SUTURE) IMPLANT
SUT PROLENE BLUE 7 0 (SUTURE) ×7 IMPLANT
SUT SILK  1 MH (SUTURE)
SUT SILK 1 MH (SUTURE) IMPLANT
SUT SILK 2 0 SH CR/8 (SUTURE) IMPLANT
SUT SILK 3 0 SH CR/8 (SUTURE) IMPLANT
SUT STEEL 6MS V (SUTURE) ×8 IMPLANT
SUT STEEL SZ 6 DBL 3X14 BALL (SUTURE) ×4 IMPLANT
SUT VIC AB 1 CTX 36 (SUTURE) ×8
SUT VIC AB 1 CTX36XBRD ANBCTR (SUTURE) ×4 IMPLANT
SUT VIC AB 2-0 CT1 27 (SUTURE)
SUT VIC AB 2-0 CT1 36 (SUTURE) ×3 IMPLANT
SUT VIC AB 2-0 CT1 TAPERPNT 27 (SUTURE) IMPLANT
SUT VIC AB 2-0 CTX 27 (SUTURE) IMPLANT
SUT VIC AB 3-0 X1 27 (SUTURE) IMPLANT
SUTURE E-PAK OPEN HEART (SUTURE) ×4 IMPLANT
SYSTEM SAHARA CHEST DRAIN ATS (WOUND CARE) ×4 IMPLANT
TAPE CLOTH SURG 4X10 WHT LF (GAUZE/BANDAGES/DRESSINGS) ×3 IMPLANT
TAPE PAPER 2X10 WHT MICROPORE (GAUZE/BANDAGES/DRESSINGS) ×3 IMPLANT
TOWEL OR 17X24 6PK STRL BLUE (TOWEL DISPOSABLE) ×8 IMPLANT
TOWEL OR 17X26 10 PK STRL BLUE (TOWEL DISPOSABLE) ×8 IMPLANT
TRAY FOLEY IC TEMP SENS 16FR (CATHETERS) ×4 IMPLANT
TUBING INSUFFLATION 10FT LAP (TUBING) ×4 IMPLANT
UNDERPAD 30X30 INCONTINENT (UNDERPADS AND DIAPERS) ×4 IMPLANT
WATER STERILE IRR 1000ML POUR (IV SOLUTION) ×8 IMPLANT

## 2013-12-30 NOTE — Anesthesia Preprocedure Evaluation (Addendum)
Anesthesia Evaluation  Patient identified by MRN, date of birth, ID band Patient awake    Reviewed: Allergy & Precautions, H&P , NPO status , Patient's Chart, lab work & pertinent test results, reviewed documented beta blocker date and time   Airway Mallampati: II TM Distance: >3 FB Neck ROM: Full    Dental  (+) Dental Advisory Given, Teeth Intact   Pulmonary sleep apnea and Continuous Positive Airway Pressure Ventilation ,  + rhonchi         Cardiovascular hypertension, Pt. on medications and Pt. on home beta blockers + CAD, + Past MI, + Cardiac Stents and + Peripheral Vascular Disease Rhythm:Regular Rate:Normal   12/27/13 2D Echo:  Study Conclusions  - Left ventricle: The cavity size was at the upper limits of   normal. Wall thickness was normal. Systolic function was normal. The estimated ejection fraction was in the range of 55% to 60%. Wall motion was normal; there were no regional wall motion abnormalities. Features are consistent with a pseudonormal left ventricular filling pattern, with concomitant abnormal relaxation   and increased filling pressure (grade 2 diastolic dysfunction). - Aortic valve: There was trivial regurgitation. - Right atrium: The atrium was mildly dilated. - Atrial septum: A patent foramen ovale cannot be excluded. There was an atrial septal aneurysm.    Neuro/Psych    GI/Hepatic hiatal hernia,   Endo/Other  Morbid obesity  Renal/GU      Musculoskeletal  (+) Arthritis -,   Abdominal (+) + obese,   Peds  Hematology   Anesthesia Other Findings   Reproductive/Obstetrics                       Anesthesia Physical Anesthesia Plan  ASA: III  Anesthesia Plan: General   Post-op Pain Management:    Induction: Intravenous  Airway Management Planned: Oral ETT  Additional Equipment: Arterial line, CVP, PA Cath, 3D TEE and Ultrasound Guidance Line Placement  Intra-op  Plan:   Post-operative Plan: Post-operative intubation/ventilation  Informed Consent: I have reviewed the patients History and Physical, chart, labs and discussed the procedure including the risks, benefits and alternatives for the proposed anesthesia with the patient or authorized representative who has indicated his/her understanding and acceptance.   Dental advisory given  Plan Discussed with: CRNA, Anesthesiologist and Surgeon  Anesthesia Plan Comments:        Anesthesia Quick Evaluation

## 2013-12-30 NOTE — Progress Notes (Signed)
The patient was examined and preop studies reviewed. There has been no change from the prior exam and the patient is ready for surgery.  plan CABG on D [place] today

## 2013-12-30 NOTE — Procedures (Signed)
Extubation Procedure Note  Patient Details:   Name: ZAKHI DUPRE DOB: [birth year] MRN: [id number]   Airway Documentation:   Pt extubated to 4L [place]. No stridor noted. BBS equal and dim. Pt able to vocalize and cough.  Evaluation  O2 sats: stable throughout and currently acceptable Complications: No apparent complications Patient did tolerate procedure well. Bilateral Breath Sounds: Clear;Diminished Suctioning: Airway   Lissa Merlin 12/30/2013, 6:19 PM

## 2013-12-30 NOTE — Brief Op Note (Signed)
12/26/2013 - 12/30/2013  11:34 AM  PATIENT:  [name]  64 y.o. male  PRE-OPERATIVE DIAGNOSIS:  CAD  POST-OPERATIVE DIAGNOSIS:  CAD  PROCEDURE:   CORONARY ARTERY BYPASS GRAFTING x 3 (LIMA-LAD, SVG-D, SVG-OM1)  ENDOSCOPIC VEIN HARVEST RIGHT THIGH  SURGEON:  Surgeon(s): Ivin Poot, MD  ASSISTANT: Suzzanne Cloud, PA-C  ANESTHESIA:   general  PATIENT CONDITION:  ICU - intubated and hemodynamically stable.  PRE-OPERATIVE WEIGHT: 110 kg

## 2013-12-30 NOTE — OR Nursing (Signed)
12:20-1st call to SICU

## 2013-12-30 NOTE — Transfer of Care (Signed)
Immediate Anesthesia Transfer of Care Note  Patient: [name]  Procedure(s) Performed: Procedure(s): CORONARY ARTERY BYPASS GRAFTING (CABG) x three, using left internal mammary artery and right leg greater sapheneous vein harvested endoscopically - LIMA to LAD, SVG-D, SVG-OM1 (N/A)  Patient Location: SICU  Anesthesia Type:General  Level of Consciousness: sedated, unresponsive and Patient remains intubated per anesthesia plan  Airway & Oxygen Therapy: Patient remains intubated per anesthesia plan and Patient placed on Ventilator (see vital sign flow sheet for setting)  Post-op Assessment: Report given to PACU RN and Post -op Vital signs reviewed and stable  Post vital signs: Reviewed and stable  Complications: No apparent anesthesia complications

## 2013-12-30 NOTE — Progress Notes (Signed)
PM ROUNDS  Intubated, starting to stir a little  Om IMV 4, 40 %- weaning  BP 106/64  Pulse 80  Temp(Src) 98.4 F (36.9 C) (Oral)  Resp 25  Ht 5\' 8"  (1.727 m)  Wt 242 lb 8.1 oz (110 kg)  BMI 36.33 kg/m2  SpO2 99%  CO= 4.96   Intake/Output Summary (Last 24 hours) at 12/30/13 1757 Last data filed at 12/30/13 1700  Gross per 24 hour  Intake 5447.34 ml  Output   2725 ml  Net 2722.34 ml    Minimal CT output  K= 3.3- supplemented Hct 41  Doing well early postop

## 2013-12-30 NOTE — Plan of Care (Signed)
Problem: Phase I - Pre-Op Goal: Point person for discharge identified Outcome: Completed/Met Date Met:  12/30/13 Home with wife

## 2013-12-30 NOTE — OR Nursing (Signed)
2nd call to SICU

## 2013-12-30 NOTE — Progress Notes (Signed)
Upon documentation of patients pre procedure checklist, it was noted the PCR surgical swab done on 6/27 was positive for staph aureus . Standing protocol was initiated. Will continue to monitor.

## 2013-12-31 ENCOUNTER — Inpatient Hospital Stay (HOSPITAL_COMMUNITY): Payer: BC Managed Care – PPO

## 2013-12-31 ENCOUNTER — Encounter (HOSPITAL_COMMUNITY): Payer: Self-pay | Admitting: Cardiothoracic Surgery

## 2013-12-31 LAB — PREPARE PLATELET PHERESIS: UNIT DIVISION: 0

## 2013-12-31 LAB — BLOOD GAS, ARTERIAL
Acid-Base Excess: 1.5 mmol/L (ref 0.0–2.0)
Bicarbonate: 25.4 mEq/L — ABNORMAL HIGH (ref 20.0–24.0)
Drawn by: 296031
FIO2: 21 %
O2 Saturation: 95.3 %
Patient temperature: 98.6
TCO2: 26.6 mmol/L (ref 0–100)
pCO2 arterial: 38.6 mmHg (ref 35.0–45.0)
pH, Arterial: 7.433 (ref 7.350–7.450)
pO2, Arterial: 76.9 mmHg — ABNORMAL LOW (ref 80.0–100.0)

## 2013-12-31 LAB — GLUCOSE, CAPILLARY
GLUCOSE-CAPILLARY: 128 mg/dL — AB (ref 70–99)
GLUCOSE-CAPILLARY: 89 mg/dL (ref 70–99)
GLUCOSE-CAPILLARY: 161 mg/dL — AB (ref 70–99)
GLUCOSE-CAPILLARY: 119 mg/dL — AB (ref 70–99)
GLUCOSE-CAPILLARY: 90 mg/dL (ref 70–99)
GLUCOSE-CAPILLARY: 119 mg/dL — AB (ref 70–99)
GLUCOSE-CAPILLARY: 100 mg/dL — AB (ref 70–99)
Glucose-Capillary: 100 mg/dL — ABNORMAL HIGH (ref 70–99)
Glucose-Capillary: 108 mg/dL — ABNORMAL HIGH (ref 70–99)
Glucose-Capillary: 114 mg/dL — ABNORMAL HIGH (ref 70–99)
Glucose-Capillary: 115 mg/dL — ABNORMAL HIGH (ref 70–99)
Glucose-Capillary: 123 mg/dL — ABNORMAL HIGH (ref 70–99)
Glucose-Capillary: 125 mg/dL — ABNORMAL HIGH (ref 70–99)
Glucose-Capillary: 130 mg/dL — ABNORMAL HIGH (ref 70–99)
Glucose-Capillary: 133 mg/dL — ABNORMAL HIGH (ref 70–99)
Glucose-Capillary: 133 mg/dL — ABNORMAL HIGH (ref 70–99)
Glucose-Capillary: 142 mg/dL — ABNORMAL HIGH (ref 70–99)
Glucose-Capillary: 148 mg/dL — ABNORMAL HIGH (ref 70–99)
Glucose-Capillary: 159 mg/dL — ABNORMAL HIGH (ref 70–99)
Glucose-Capillary: 165 mg/dL — ABNORMAL HIGH (ref 70–99)
Glucose-Capillary: 186 mg/dL — ABNORMAL HIGH (ref 70–99)
Glucose-Capillary: 223 mg/dL — ABNORMAL HIGH (ref 70–99)
Glucose-Capillary: 78 mg/dL (ref 70–99)
Glucose-Capillary: 79 mg/dL (ref 70–99)
Glucose-Capillary: 88 mg/dL (ref 70–99)
Glucose-Capillary: 92 mg/dL (ref 70–99)

## 2013-12-31 LAB — BASIC METABOLIC PANEL
BUN: 18 mg/dL (ref 6–23)
CALCIUM: 7.9 mg/dL — AB (ref 8.4–10.5)
CO2: 23 mEq/L (ref 19–32)
Chloride: 101 mEq/L (ref 96–112)
Creatinine, Ser: 0.7 mg/dL (ref 0.50–1.35)
GFR calc Af Amer: >90 mL/min (ref >90)
Glucose, Bld: 126 mg/dL — ABNORMAL HIGH (ref 70–99)
Potassium: 3.6 mEq/L — ABNORMAL LOW (ref 3.7–5.3)
Sodium: 137 mEq/L (ref 137–147)

## 2013-12-31 LAB — CBC
HCT: 39.4 % (ref 39.0–52.0)
HEMATOCRIT: 41 % (ref 39.0–52.0)
HEMOGLOBIN: 13.3 g/dL (ref 13.0–17.0)
Hemoglobin: 13.7 g/dL (ref 13.0–17.0)
MCH: 31.1 pg (ref 26.0–34.0)
MCH: 31.1 pg (ref 26.0–34.0)
MCHC: 33.8 g/dL (ref 30.0–36.0)
MCHC: 33.4 g/dL (ref 30.0–36.0)
MCV: 92.1 fL (ref 78.0–100.0)
MCV: 93 fL (ref 78.0–100.0)
PLATELETS: 185 10*3/uL (ref 150–400)
Platelets: 161 10*3/uL (ref 150–400)
RBC: 4.28 MIL/uL (ref 4.22–5.81)
RBC: 4.41 MIL/uL (ref 4.22–5.81)
RDW: 13.6 % (ref 11.5–15.5)
RDW: 13.6 % (ref 11.5–15.5)
WBC: 14.3 10*3/uL — ABNORMAL HIGH (ref 4.0–10.5)
WBC: 16.2 10*3/uL — ABNORMAL HIGH (ref 4.0–10.5)

## 2013-12-31 LAB — POCT I-STAT, CHEM 8
BUN: 15 mg/dL (ref 6–23)
Calcium, Ion: 1.18 mmol/L (ref 1.13–1.30)
Chloride: 95 mEq/L — ABNORMAL LOW (ref 96–112)
Creatinine, Ser: 0.8 mg/dL (ref 0.50–1.35)
Glucose, Bld: 197 mg/dL — ABNORMAL HIGH (ref 70–99)
HEMATOCRIT: 43 % (ref 39.0–52.0)
Hemoglobin: 14.6 g/dL (ref 13.0–17.0)
Potassium: 3.5 mEq/L — ABNORMAL LOW (ref 3.7–5.3)
Sodium: 136 mEq/L — ABNORMAL LOW (ref 137–147)
TCO2: 22 mmol/L (ref 0–100)

## 2013-12-31 LAB — MAGNESIUM
MAGNESIUM: 2.1 mg/dL (ref 1.5–2.5)
Magnesium: 2.2 mg/dL (ref 1.5–2.5)

## 2013-12-31 LAB — CREATININE, SERUM
Creatinine, Ser: 0.81 mg/dL (ref 0.50–1.35)
GFR calc Af Amer: >90 mL/min (ref >90)
GFR calc non Af Amer: >90 mL/min (ref >90)

## 2013-12-31 MED ORDER — TRAMADOL HCL 50 MG PO TABS: 50.0000 mg | ORAL_TABLET | Freq: Four times a day (QID) | ORAL | Status: DC | PRN

## 2013-12-31 MED ORDER — LORATADINE 10 MG PO TABS
10.0000 mg | ORAL_TABLET | Freq: Every day | ORAL | Status: DC | PRN
  Administered 2014-01-01: 10 mg via ORAL
  Filled 2013-12-31 (×2): qty 1

## 2013-12-31 MED ORDER — POTASSIUM CHLORIDE CRYS ER 20 MEQ PO TBCR
20.0000 meq | EXTENDED_RELEASE_TABLET | Freq: Once | ORAL | Status: AC
  Administered 2013-12-31: 20 meq via ORAL
  Filled 2013-12-31: qty 1

## 2013-12-31 MED ORDER — INSULIN ASPART 100 UNIT/ML ~~LOC~~ SOLN
0.0000 [IU] | SUBCUTANEOUS | Status: DC
  Administered 2013-12-31: 17:00:00 via SUBCUTANEOUS
  Administered 2014-01-01: 2 [IU] via SUBCUTANEOUS

## 2013-12-31 MED ORDER — FUROSEMIDE 10 MG/ML IJ SOLN
40.0000 mg | Freq: Two times a day (BID) | INTRAMUSCULAR | Status: AC
  Administered 2013-12-31 – 2014-01-01 (×3): 40 mg via INTRAVENOUS
  Filled 2013-12-31 (×5): qty 4

## 2013-12-31 MED ORDER — INSULIN DETEMIR 100 UNIT/ML ~~LOC~~ SOLN
8.0000 [IU] | Freq: Every day | SUBCUTANEOUS | Status: DC
  Administered 2013-12-31: 8 [IU] via SUBCUTANEOUS
  Filled 2013-12-31 (×3): qty 0.08

## 2013-12-31 MED ORDER — POTASSIUM CHLORIDE 10 MEQ/50ML IV SOLN
10.0000 meq | INTRAVENOUS | Status: AC
  Administered 2013-12-31 (×3): 10 meq via INTRAVENOUS
  Filled 2013-12-31: qty 50

## 2013-12-31 MED ORDER — NAPROXEN 500 MG PO TABS
500.0000 mg | ORAL_TABLET | Freq: Two times a day (BID) | ORAL | Status: DC
  Administered 2014-01-02 – 2014-01-03 (×2): 500 mg via ORAL
  Filled 2013-12-31 (×9): qty 1

## 2013-12-31 MED FILL — Electrolyte-R (PH 7.4) Solution: INTRAVENOUS | Qty: 3000 | Status: AC

## 2013-12-31 MED FILL — Sodium Bicarbonate IV Soln 8.4%: INTRAVENOUS | Qty: 100 | Status: AC

## 2013-12-31 MED FILL — Heparin Sodium (Porcine) Inj 1000 Unit/ML: INTRAMUSCULAR | Qty: 10 | Status: AC

## 2013-12-31 MED FILL — Lidocaine HCl IV Inj 20 MG/ML: INTRAVENOUS | Qty: 10 | Status: AC

## 2013-12-31 MED FILL — Sodium Chloride IV Soln 0.9%: INTRAVENOUS | Qty: 2000 | Status: AC

## 2013-12-31 NOTE — Op Note (Signed)
NAMECRESCENCIO, JOZWIAK              ACCOUNT NO.:  [id number]  MEDICAL RECORD NO.:  [id number]  LOCATION:  2S14C                        FACILITY:  [facility]  PHYSICIAN:  Ivin Poot, M.D.  DATE OF BIRTH:  Feb 25, 1951  DATE OF PROCEDURE:  12/30/2013 DATE OF DISCHARGE:                              OPERATIVE REPORT   OPERATION: 1. Coronary artery bypass grafting x3 (left internal mammary artery to     LAD, saphenous vein graft to diagonal, saphenous vein graft to     OM1). 2. Endoscopic harvest of right leg greater saphenous vein.  PREOPERATIVE DIAGNOSIS:  Severe left main stenosis with unstable angina.  POSTOPERATIVE DIAGNOSIS:  Severe left main stenosis with unstable angina.  SURGEON:  Ivin Poot, M.D.  ASSISTANT:  Suzzanne Cloud, PA-C.  ANESTHESIA:  General by Dr. Mliss Sax.  INDICATIONS:  The patient is a 63 year old, obese male with history of prior coronary artery disease, angina, and coronary disease treated with PCI.  The patient presented with symptoms of unstable angina.  Coronary angiograms were performed.  Cardiac enzymes were negative.  He has a patent drug-eluting stent to the right coronary without distal disease. The patient had a high-grade left main stenosis and proximal in-stent LAD stenosis and proximal circumflex stenosis.  The diagonal branch to LAD also had a stenosis at its origin.  LV systolic function was well- preserved and 2D echocardiogram demonstrated no significant valvular disease.  He was felt to be candidate for surgical coronary revascularization.  I examined the patient in his hospital room after reviewing the results of the cardiac catheterization.  The patient had been on regular dose Plavix for over a year for his previously placed stents.  He was not having any rest symptoms and the plan was to schedule surgery for this admit, but after a Plavix washout.  His initial platelet assay with P2Y12 for Plavix activity was  extremely abnormal.  With time in 72 hours of Plavix washout, the P2Y12 level significantly increased.  The patient understood the risks of this operation as well as the risks of alternative therapies for his left main stenosis.  We discussed the details of surgery including location of the surgical incisions, the use of general anesthesia and cardiopulmonary bypass, and the expected postoperative hospital recovery.  I discussed the risks of stroke, MI, bleeding, blood transfusion requirement, infection, postop pleural effusion, postop cardiac arrhythmia, and death.  He demonstrated his understanding and agreed to proceed with surgery under what I felt was an informed consent.  OPERATIVE FINDINGS: 1. Diffusely diseased LAD and diagonal with calcification, but     adequate targets. 2. Good conduit. 3. No intraoperative blood cell transfusion required, 1 platelet     transfusion required for diffuse coagulopathy after reversal of     heparin with protamine.  OPERATIVE PROCEDURE:  The patient was brought to the operating room and placed supine on the operating table.  General anesthesia was induced under invasive hemodynamic monitoring.  The chest, abdomen, and legs were prepped with Betadine and draped as a sterile field.  A proper time- out was performed.  A transesophageal echo probe was not placed because the patient has a lap band for previous  bariatric surgery.  A sternal incision was made as the saphenous vein was harvested endoscopically from the right leg.  The left internal mammary artery was harvested as a pedicle graft from its origin at the subclavian vessels.  It was a 1.5- mm vessel with excellent flow.  The sternal retractor was placed using the deep blades due to the patient's obese body habitus.  The pericardium was opened and suspended.  Pursestrings were placed in the ascending aorta and right atrium and heparin was administered.  The ACT was documented as being  therapeutic and the patient was cannulated and placed on cardiopulmonary bypass.  The coronary arteries were identified for grafting and the mammary artery and vein grafts were prepared for the distal anastomoses.  Cardioplegia cannulas were placed with antegrade and retrograde cold blood cardioplegia and the patient was cooled to 32 degrees.  The aortic crossclamp was applied and 1 liter of cold blood cardioplegia was delivered in split doses between the antegrade aortic and retrograde coronary sinus catheters.  There was good cardioplegic arrest and septal temperature dropped less than 12 degrees.  Cardioplegia was delivered every 20 minutes or less.  The distal coronary anastomoses were performed.  The first distal anastomosis was to the OM1 branch of the left coronary.  It had a proximal left main stenosis of 95%.  A reverse saphenous vein was sewn end-to-side with running 7-0 Prolene with excellent flow through the graft.  Cardioplegia was redosed.  The second distal anastomosis was at the diagonal branch to LAD.  This is 1.5-mm vessel with a calcified 90% stenosis at its origin.  A reverse saphenous vein was sewn end-to-side with running 7-0 Prolene with excellent flow through graft.  Cardioplegia was redosed.  The third distal anastomosis with the distal third of the LAD.  It was very heavily calcified proximally and diffusely mildly calcified distally.  The left IMA pedicle was brought through an opening in the left lateral pericardium was brought down onto the LAD and sewn end-to- side with running 8-0 Prolene.  There was excellent flow through the anastomosis after briefly releasing the pedicle bulldog on the mammary artery.  The bulldog was reapplied and the pedicle was secured to the epicardium with 6-0 Prolene.  Cardioplegia was redosed.  While the crossclamp was still in place, 2 proximal vein anastomoses were performed on the ascending aorta with a 4.5 mm punch running  6-0 Prolene.  Prior to tying down the final proximal anastomosis, the air was vented from the coronaries with a dose of retrograde warm blood cardioplegia.  The crossclamp was then removed.  The vein grafts were de-aired and the grafts were open.  The cardioplegia cannula was removed.  The patient was cardioverted back to a regular rhythm.  The proximal and distal anastomosis were checked and found to be hemostatic.  There was good flow down the grafts.  Temporary pacer wires were applied.  The patient was rewarmed and reperfused.  The lungs were expanded and the ventilator was resumed.  The patient was weaned from cardiopulmonary bypass without difficulty.  Cardiac output was 7L and blood pressure was normal.  No inotropes were required. Protamine was administered without adverse reaction.  There was still considerable generalized oozing especially from the chest wall and mediastinal fat.  The patient received a unit of platelets with improved coagulation function.  The superior pericardial fat was closed over the aorta and vein grafts.  The anterior mediastinal and left pleural chest tube were placed and brought through  separate incisions.  The sternum was closed with interrupted steel wire.  The pectoralis fascia was closed in a running #1 Vicryl.  The subcutaneous and skin layers were closed with a running Vicryl and sterile dressings were applied.  Total cardiopulmonary bypass time was 100 minutes.     Ivin Poot, M.D.     PV/MEDQ  D:  12/30/2013  T:  12/31/2013  Job:  [id number]  cc:   Loralie Champagne, MD

## 2013-12-31 NOTE — Progress Notes (Signed)
Patient ID: [name], male   DOB: Oct 23, 1950, 63 y.o.   MRN: [id number] EVENING ROUNDS NOTE :     [address]       [place] [address]             [phone]                 1 Day Post-Op Procedure(s) (LRB): CORONARY ARTERY BYPASS GRAFTING (CABG) x three, using left internal mammary artery and right leg greater sapheneous vein harvested endoscopically - LIMA to LAD, SVG-D, SVG-OM1 (N/A)  Total Length of Stay:  LOS: 5 days  BP 137/74  Pulse 76  Temp(Src) 99.9 F (37.7 C) (Oral)  Resp 23  Ht 5\' 8"  (1.727 m)  Wt 250 lb 7.1 oz (113.6 kg)  BMI 38.09 kg/m2  SpO2 100%  .Intake/Output     06/30 0701 - 07/01 0700 07/01 0701 - 07/02 0700   P.O.     I.V. (mL/kg) 4753.3 (41.8) 244.7 (2.2)   Blood 804    IV Piggyback 1000 350   Total Intake(mL/kg) 6557.3 (57.7) 594.7 (5.2)   Urine (mL/kg/hr) 2565 (0.9) 1530 (1.2)   Emesis/NG output 150 (0.1)    Blood 850 (0.3)    Chest Tube 352 (0.1) 95 (0.1)   Total Output 3917 1625   Net +2640.3 -1030.3          . sodium chloride 20 mL/hr at 12/31/13 0800  . sodium chloride 10 mL/hr at 12/31/13 0800  . sodium chloride    . insulin (NOVOLIN-R) infusion Stopped (12/31/13 1200)  . lactated ringers 20 mL/hr at 12/31/13 0800  . phenylephrine (NEO-SYNEPHRINE) Adult infusion Stopped (12/31/13 0400)     Lab Results  Component Value Date   WBC 16.2* 12/31/2013   HGB 13.7 12/31/2013   HCT 41.0 12/31/2013   PLT 185 12/31/2013   GLUCOSE 197* 12/31/2013   CHOL 146 12/27/2013   TRIG 90 12/27/2013   HDL 49 12/27/2013   LDLCALC 79 12/27/2013   ALT 23 08/29/2012   AST 23 08/29/2012   NA 136* 12/31/2013   K 3.5* 12/31/2013   CL 95* 12/31/2013   CREATININE 0.81 12/31/2013   BUN 15 12/31/2013   CO2 23 12/31/2013   TSH 1.850 12/26/2013   INR 1.28 12/30/2013   HGBA1C 5.5 12/26/2013   Stable day, walked in [employer] MD  Beeper [phone] Office [phone] 12/31/2013 6:31 PM

## 2013-12-31 NOTE — Progress Notes (Addendum)
T[facility] DAILY ICU PROGRESS NOTE                   [address]            [place] [address]          [phone]   1 Day Post-Op Procedure(s) (LRB): CORONARY ARTERY BYPASS GRAFTING (CABG) x three, using left internal mammary artery and right leg greater sapheneous vein harvested endoscopically - LIMA to LAD, SVG-D, SVG-OM1 (N/A)  Total Length of Stay:  LOS: 5 days   Subjective: Awake, alert, extubated, on CPAP and breathing comfortably. Sore, otherwise no complaints.   Objective: Vital signs in last 24 hours: Temp:  [96.8 F (36 C)-100.6 F (38.1 C)] 98.1 F (36.7 C) (07/01 0700) Pulse Rate:  [66-80] 66 (07/01 0700) Cardiac Rhythm:  [-] Normal sinus rhythm;Atrial paced (07/01 0600) Resp:  [12-27] 23 (07/01 0700) BP: (106)/(53-64) 106/64 mmHg (06/30 1733) SpO2:  [90 %-100 %] 97 % (07/01 0700) Arterial Line BP: (98-134)/(51-79) 126/63 mmHg (07/01 0700) FiO2 (%):  [40 %-50 %] 40 % (06/30 1748) Weight:  [250 lb 7.1 oz (113.6 kg)] 250 lb 7.1 oz (113.6 kg) (07/01 0500)  Filed Weights   12/27/13 0600 12/29/13 0528 12/31/13 0500  Weight: 242 lb 12.8 oz (110.133 kg) 242 lb 8.1 oz (110 kg) 250 lb 7.1 oz (113.6 kg)  PRE-OPERATIVE WEIGHT: 110 kg   Weight change:    Hemodynamic parameters for last 24 hours: PAP: (22-36)/(11-22) 36/22 mmHg CO:  [4.2 L/min-8.5 L/min] 8.5 L/min CI:  [1.9 L/min/m2-3.4 L/min/m2] 3 L/min/m2  Intake/Output from previous day: 06/30 0701 - 07/01 0700 In: 6557.3 [I.V.:4753.3; Blood:804; IV Piggyback:1000] Out: 7564 [Urine:2565; Emesis/NG output:150; Blood:850; Chest Tube:352]  CBGs 128-89-142-148-159-165-161-119-179-126     Current Meds: Scheduled Meds: . acetaminophen  1,000 mg Oral 4 times per day   Or  . acetaminophen (TYLENOL) oral liquid 160 mg/5 mL  1,000 mg Per Tube 4 times per day  . aspirin EC  325 mg Oral Daily   Or  . aspirin  324 mg Per Tube Daily  . atorvastatin  20 mg Oral q1800  . bisacodyl  10 mg Oral Daily   Or    . bisacodyl  10 mg Rectal Daily  . cefUROXime (ZINACEF)  IV  1.5 g Intravenous Q12H  . docusate sodium  200 mg Oral Daily  . insulin regular  0-10 Units Intravenous TID WC  . metoCLOPramide (REGLAN) injection  10 mg Intravenous 4 times per day  . metoprolol tartrate  12.5 mg Oral BID   Or  . metoprolol tartrate  12.5 mg Per Tube BID  . [START ON 01/01/2014] pantoprazole  40 mg Oral Daily  . potassium chloride  10 mEq Intravenous Q1 Hr x 3  . sodium chloride  3 mL Intravenous Q12H  . vancomycin  1,000 mg Intravenous Q12H   Continuous Infusions: . sodium chloride 20 mL/hr at 12/31/13 0700  . sodium chloride 10 mL/hr at 12/31/13 0700  . sodium chloride    . dexmedetomidine Stopped (12/31/13 0500)  . insulin (NOVOLIN-R) infusion 3.8 Units/hr (12/31/13 0700)  . lactated ringers 20 mL/hr at 12/31/13 0700  . nitroGLYCERIN Stopped (12/30/13 1430)  . phenylephrine (NEO-SYNEPHRINE) Adult infusion Stopped (12/31/13 0400)   PRN Meds:.albumin human, metoprolol, midazolam, morphine injection, ondansetron (ZOFRAN) IV, oxyCODONE, sodium chloride   Physical Exam: General appearance: alert, cooperative and no distress Heart: Regular rate and rhythm, paced at 70, but SR around 62 under pacer Lungs:  diminished breath sounds bibasilar Extremities: no edema, redness or tenderness in the calves or thighs Wound: Dressed and dry    Lab Results: CBC: Recent Labs  12/30/13 2000 12/30/13 2004 12/31/13 0403  WBC 13.2*  --  14.3*  HGB 14.4 15.3 13.3  HCT 42.7 45.0 39.4  PLT 170  --  161   BMET:  Recent Labs  12/30/13 0422  12/30/13 2004 12/31/13 0403  NA 137  < > 142 137  K 3.6*  < > 3.5* 3.6*  CL 97  --  102 101  CO2 23  --   --  23  GLUCOSE 98  < > 151* 126*  BUN 17  --  15 18  CREATININE 0.82  < > 0.80 0.70  CALCIUM 9.4  --   --  7.9*  < > = values in this interval not displayed.  PT/INR:  Recent Labs  12/30/13 1350  LABPROT 16.0*  INR 1.28   Radiology: Dg Chest 1  View  12/30/2013   CLINICAL DATA:  Check Swan-Ganz placement  EXAM: CHEST - 1 VIEW  COMPARISON:  Film from earlier in the same day  FINDINGS: The cardiac shadow remains enlarged. The Swan-Ganz catheter has been withdrawn and repositioned and now lies within the pulmonary outflow tract directed towards the right pulmonary artery. A nasogastric catheter is been placed and lies within the stomach. Mild vascular congestion is noted although this may be related to a poor inspiratory effort.  IMPRESSION: Swan-Ganz catheter now within the pulmonary outflow tract directed towards a right pulmonary artery.  Nasogastric catheter in satisfactory position.  Vascular congestion although this may be in part due to a poor inspiratory effort.   Electronically Signed   By: Inez Catalina M.D.   On: 12/30/2013 11:00   Dg Chest Portable 1 View  12/30/2013   CLINICAL DATA:  Post CABG  EXAM: PORTABLE CHEST - 1 VIEW  COMPARISON:  12/30/2013  FINDINGS: Cardiomediastinal silhouette is stable. Right Swan-Ganz catheter is unchanged in position. Left chest tube in place. Status post median sternotomy. NG tube in place. Endotracheal tube with tip 4.4 cm above the carina. No pneumothorax. Mild left basilar atelectasis. No infiltrate or pulmonary edema.  IMPRESSION: Stable right Swan-Ganz catheter position. Endotracheal and NG tube in place. Left chest tube. Status post CABG. Mild left basilar atelectasis. No pulmonary edema.   Electronically Signed   By: Lahoma Crocker M.D.   On: 12/30/2013 14:22   Dg Chest Portable 1 View  12/30/2013   CLINICAL DATA:  Swan-Ganz catheter placement.  EXAM: PORTABLE CHEST - 1 VIEW  COMPARISON:  December 27, 2013.  FINDINGS: Stable cardiomegaly. No pneumothorax is noted. No significant pleural effusion is noted. No acute pulmonary disease is noted. Interval placement of right internal jugular Swan-Ganz catheter with catheter an tip coiled within the right ventricle. Bony thorax is intact.  IMPRESSION: Interval  placement of right internal jugular Swan-Ganz catheter. The distal catheter and tip are coiled within the right ventricle. Critical Value/emergent results were called by telephone at the time of interpretation on 12/30/2013 at 7:57 AM to Elita Quick in the [employer], who verbally acknowledged these results and will contact Dr. Prescott Gum.   Electronically Signed   By: Sabino Dick M.D.   On: 12/30/2013 07:59     Assessment/Plan: S/P Procedure(s) (LRB): CORONARY ARTERY BYPASS GRAFTING (CABG) x three, using left internal mammary artery and right leg greater sapheneous vein harvested endoscopically - LIMA to LAD, SVG-D, SVG-OM1 (N/A)  CV- paced at 70, SR around 62 under pacer.  BPs stable, off all drips.  Endocrine- CBGs stable, will wean insulin gtt and convert to Levemir.  Not on meds at home (A1C=5.5)  Pulm- stable, currently on home CPAP.  Will transition to nasal cannula O2.  Hypokalemia- receiving runs of K+.  Expected postop blood loss anemia- [name]/[name] stable.  Mobilize, d/c lines and tubes, routine POD#1 progression.   COLLINS,[name] 12/31/2013 7:45 AM  patient examined and medical record reviewed,agree with above note. Resume plavix postop for patent RCA stent start 75 mg in am 7-2 VAN TRIGT III,PETER 12/31/2013

## 2014-01-01 ENCOUNTER — Inpatient Hospital Stay (HOSPITAL_COMMUNITY): Payer: BC Managed Care – PPO

## 2014-01-01 LAB — TYPE AND SCREEN
ABO/RH(D): A POS
Antibody Screen: NEGATIVE
Unit division: 0
Unit division: 0
Unit division: 0

## 2014-01-01 LAB — BASIC METABOLIC PANEL
Anion gap: 12 (ref 5–15)
BUN: 16 mg/dL (ref 6–23)
CO2: 27 mEq/L (ref 19–32)
Calcium: 8.4 mg/dL (ref 8.4–10.5)
Chloride: 95 mEq/L — ABNORMAL LOW (ref 96–112)
Creatinine, Ser: 0.81 mg/dL (ref 0.50–1.35)
GFR calc Af Amer: >90 mL/min (ref >90)
GFR calc non Af Amer: >90 mL/min (ref >90)
Glucose, Bld: 105 mg/dL — ABNORMAL HIGH (ref 70–99)
Potassium: 3.9 mEq/L (ref 3.7–5.3)
Sodium: 134 mEq/L — ABNORMAL LOW (ref 137–147)

## 2014-01-01 LAB — CBC
HCT: 37.5 % — ABNORMAL LOW (ref 39.0–52.0)
Hemoglobin: 12.5 g/dL — ABNORMAL LOW (ref 13.0–17.0)
MCH: 30.9 pg (ref 26.0–34.0)
MCHC: 33.3 g/dL (ref 30.0–36.0)
MCV: 92.8 fL (ref 78.0–100.0)
Platelets: 169 10*3/uL (ref 150–400)
RBC: 4.04 MIL/uL — ABNORMAL LOW (ref 4.22–5.81)
RDW: 13.7 % (ref 11.5–15.5)
WBC: 15.5 10*3/uL — ABNORMAL HIGH (ref 4.0–10.5)

## 2014-01-01 LAB — GLUCOSE, CAPILLARY
GLUCOSE-CAPILLARY: 97 mg/dL (ref 70–99)
GLUCOSE-CAPILLARY: 100 mg/dL — AB (ref 70–99)
Glucose-Capillary: 119 mg/dL — ABNORMAL HIGH (ref 70–99)
Glucose-Capillary: 125 mg/dL — ABNORMAL HIGH (ref 70–99)
Glucose-Capillary: 108 mg/dL — ABNORMAL HIGH (ref 70–99)

## 2014-01-01 MED ORDER — SODIUM CHLORIDE 0.9 % IV SOLN: 250.0000 mL | INTRAVENOUS | Status: DC | PRN

## 2014-01-01 MED ORDER — MAGNESIUM HYDROXIDE 400 MG/5ML PO SUSP: 30.0000 mL | Freq: Every day | ORAL | Status: DC | PRN

## 2014-01-01 MED ORDER — INSULIN DETEMIR 100 UNIT/ML ~~LOC~~ SOLN
10.0000 [IU] | Freq: Every day | SUBCUTANEOUS | Status: DC
  Administered 2014-01-01: 10 [IU] via SUBCUTANEOUS
  Filled 2014-01-01 (×2): qty 0.1

## 2014-01-01 MED ORDER — ALUM & MAG HYDROXIDE-SIMETH 200-200-20 MG/5ML PO SUSP: 15.0000 mL | ORAL | Status: DC | PRN

## 2014-01-01 MED ORDER — FUROSEMIDE 40 MG PO TABS
40.0000 mg | ORAL_TABLET | Freq: Every day | ORAL | Status: DC
  Administered 2014-01-01 – 2014-01-02 (×2): 40 mg via ORAL
  Filled 2014-01-01 (×3): qty 1

## 2014-01-01 MED ORDER — MOVING RIGHT ALONG BOOK
Freq: Once | Status: AC
  Administered 2014-01-01: 15:00:00
  Filled 2014-01-01: qty 1

## 2014-01-01 MED ORDER — DM-GUAIFENESIN ER 30-600 MG PO TB12
1.0000 | ORAL_TABLET | Freq: Two times a day (BID) | ORAL | Status: DC
  Administered 2014-01-01 – 2014-01-03 (×5): 1 via ORAL
  Filled 2014-01-01 (×6): qty 1

## 2014-01-01 MED ORDER — SODIUM CHLORIDE 0.9 % IJ SOLN
3.0000 mL | Freq: Two times a day (BID) | INTRAMUSCULAR | Status: DC
  Administered 2014-01-01 – 2014-01-02 (×2): 3 mL via INTRAVENOUS

## 2014-01-01 MED ORDER — POTASSIUM CHLORIDE 10 MEQ/50ML IV SOLN
10.0000 meq | INTRAVENOUS | Status: AC
  Administered 2014-01-01 (×2): 10 meq via INTRAVENOUS
  Filled 2014-01-01 (×2): qty 50

## 2014-01-01 MED ORDER — ASPIRIN EC 81 MG PO TBEC
81.0000 mg | DELAYED_RELEASE_TABLET | Freq: Every day | ORAL | Status: DC
  Administered 2014-01-01 – 2014-01-03 (×3): 81 mg via ORAL
  Filled 2014-01-01 (×3): qty 1

## 2014-01-01 MED ORDER — SODIUM CHLORIDE 0.9 % IJ SOLN: 3.0000 mL | INTRAMUSCULAR | Status: DC | PRN

## 2014-01-01 MED ORDER — METOPROLOL TARTRATE 25 MG PO TABS
25.0000 mg | ORAL_TABLET | Freq: Two times a day (BID) | ORAL | Status: DC
  Administered 2014-01-01 – 2014-01-03 (×5): 25 mg via ORAL
  Filled 2014-01-01 (×6): qty 1

## 2014-01-01 MED ORDER — CLOPIDOGREL BISULFATE 75 MG PO TABS
75.0000 mg | ORAL_TABLET | Freq: Every day | ORAL | Status: DC
  Administered 2014-01-02 – 2014-01-03 (×2): 75 mg via ORAL
  Filled 2014-01-01 (×2): qty 1

## 2014-01-01 MED ORDER — INSULIN ASPART 100 UNIT/ML ~~LOC~~ SOLN: 0.0000 [IU] | Freq: Three times a day (TID) | SUBCUTANEOUS | Status: DC

## 2014-01-01 MED FILL — Potassium Chloride Inj 2 mEq/ML: INTRAVENOUS | Qty: 40 | Status: AC

## 2014-01-01 MED FILL — Heparin Sodium (Porcine) Inj 1000 Unit/ML: INTRAMUSCULAR | Qty: 30 | Status: AC

## 2014-01-01 MED FILL — Magnesium Sulfate Inj 50%: INTRAMUSCULAR | Qty: 10 | Status: AC

## 2014-01-01 NOTE — Anesthesia Postprocedure Evaluation (Signed)
  Anesthesia Post-op Note  Patient: [name]  Procedure(s) Performed: Procedure(s): CORONARY ARTERY BYPASS GRAFTING (CABG) x three, using left internal mammary artery and right leg greater sapheneous vein harvested endoscopically - LIMA to LAD, SVG-D, SVG-OM1 (N/A)  Patient Location: PACU and SICU  Anesthesia Type:General  Level of Consciousness: awake, alert  and oriented  Airway and Oxygen Therapy: Patient Spontanous Breathing and Patient connected to nasal cannula oxygen  Post-op Pain: none  Post-op Assessment: Post-op Vital signs reviewed and Patient's Cardiovascular Status Stable  Post-op Vital Signs: Reviewed and stable  Last Vitals:  Filed Vitals:   01/01/14 0738  BP:   Pulse:   Temp: 37.4 C  Resp:     Complications: No apparent anesthesia complications

## 2014-01-01 NOTE — Progress Notes (Signed)
2 Days Post-Op Procedure(s) (LRB): CORONARY ARTERY BYPASS GRAFTING (CABG) x three, using left internal mammary artery and right leg greater sapheneous vein harvested endoscopically - LIMA to LAD, SVG-D, SVG-OM1 (N/A) Subjective: Pod # 2 CABG for L main  NSR Walked in unit CXR clear  Objective: Vital signs in last 24 hours: Temp:  [97.9 F (36.6 C)-100.2 F (37.9 C)] 99.4 F (37.4 C) (07/02 0738) Pulse Rate:  [67-84] 84 (07/02 0700) Cardiac Rhythm:  [-] Normal sinus rhythm (07/02 0600) Resp:  [18-33] 21 (07/02 0700) BP: (99-147)/(55-75) 110/62 mmHg (07/02 0700) SpO2:  [94 %-100 %] 96 % (07/02 0700) Arterial Line BP: (159-201)/(67-108) 189/108 mmHg (07/01 1400) Weight:  [243 lb 6.2 oz (110.4 kg)] 243 lb 6.2 oz (110.4 kg) (07/02 0500)  Hemodynamic parameters for last 24 hours: PAP: (39)/(18) 39/18 mmHg  Intake/Output from previous day: 07/01 0701 - 07/02 0700 In: 924.7 [I.V.:524.7; IV Piggyback:400] Out: 3300 [Urine:3345; Chest Tube:210] Intake/Output this shift:    Alert and comfortable No pleural air leak, drainage  Lab Results:  Recent Labs  12/31/13 1645 01/01/14 0430  WBC 16.2* 15.5*  HGB 13.7 12.5*  HCT 41.0 37.5*  PLT 185 169   BMET:  Recent Labs  12/31/13 0403 12/31/13 1637 12/31/13 1645 01/01/14 0430  NA 137 136*  --  134*  K 3.6* 3.5*  --  3.9  CL 101 95*  --  95*  CO2 23  --   --  27  GLUCOSE 126* 197*  --  105*  BUN 18 15  --  16  CREATININE 0.70 0.80 0.81 0.81  CALCIUM 7.9*  --   --  8.4    PT/INR:  Recent Labs  12/30/13 1350  LABPROT 16.0*  INR 1.28   ABG    Component Value Date/Time   PHART 7.383 12/30/2013 1911   HCO3 22.2 12/30/2013 1911   TCO2 22 12/31/2013 1637   ACIDBASEDEF 2.0 12/30/2013 1911   O2SAT 94.0 12/30/2013 1911   CBG (last 3)   Recent Labs  12/31/13 1919 12/31/13 2328 01/01/14 0350  GLUCAP 119* 100* 119*    Assessment/Plan: S/P Procedure(s) (LRB): CORONARY ARTERY BYPASS GRAFTING (CABG) x three, using left  internal mammary artery and right leg greater sapheneous vein harvested endoscopically - LIMA to LAD, SVG-D, SVG-OM1 (N/A) tx stepdown Cont Levemir and SSI plaxix for patent RCA stent   LOS: 6 days    VAN TRIGT III,PETER 01/01/2014

## 2014-01-01 NOTE — Progress Notes (Signed)
TCTS DAILY ICU PROGRESS NOTE                   [address]            [place] [address]          [phone]   2 Days Post-Op Procedure(s) (LRB): CORONARY ARTERY BYPASS GRAFTING (CABG) x three, using left internal mammary artery and right leg greater sapheneous vein harvested endoscopically - LIMA to LAD, SVG-D, SVG-OM1 (N/A)  Total Length of Stay:  LOS: 6 days   Subjective: OOB in chair. Feels well this am, just sore. Walked in hall yesterday. Breathing stable.   Objective: Vital signs in last 24 hours: Temp:  [97.9 F (36.6 C)-100.2 F (37.9 C)] 99.4 F (37.4 C) (07/02 0738) Pulse Rate:  [67-84] 84 (07/02 0700) Cardiac Rhythm:  [-] Normal sinus rhythm (07/02 0600) Resp:  [18-33] 21 (07/02 0700) BP: (99-147)/(55-75) 110/62 mmHg (07/02 0700) SpO2:  [94 %-100 %] 96 % (07/02 0700) Arterial Line BP: (143-201)/(67-108) 189/108 mmHg (07/01 1400) Weight:  [243 lb 6.2 oz (110.4 kg)] 243 lb 6.2 oz (110.4 kg) (07/02 0500)  Filed Weights   12/29/13 0528 12/31/13 0500 01/01/14 0500  Weight: 242 lb 8.1 oz (110 kg) 250 lb 7.1 oz (113.6 kg) 243 lb 6.2 oz (110.4 kg)  PRE-OPERATIVE WEIGHT: 110 kg   Weight change: -7 lb 0.9 oz (-3.2 kg)   Hemodynamic parameters for last 24 hours: PAP: (36-39)/(18-20) 39/18 mmHg  Intake/Output from previous day: 07/01 0701 - 07/02 0700 In: 924.7 [I.V.:524.7; IV Piggyback:400] Out: 1062 [Urine:3345; Chest Tube:210]  CBGs 223-119-100-119-105     Current Meds: Scheduled Meds: . acetaminophen  1,000 mg Oral 4 times per day   Or  . acetaminophen (TYLENOL) oral liquid 160 mg/5 mL  1,000 mg Per Tube 4 times per day  . aspirin EC  325 mg Oral Daily   Or  . aspirin  324 mg Per Tube Daily  . atorvastatin  20 mg Oral q1800  . bisacodyl  10 mg Oral Daily   Or  . bisacodyl  10 mg Rectal Daily  . cefUROXime (ZINACEF)  IV  1.5 g Intravenous Q12H  . docusate sodium  200 mg Oral Daily  . furosemide  40 mg Intravenous BID  . insulin  aspart  0-24 Units Subcutaneous 6 times per day  . insulin detemir  8 Units Subcutaneous Daily  . metoprolol tartrate  12.5 mg Oral BID   Or  . metoprolol tartrate  12.5 mg Per Tube BID  . naproxen  500 mg Oral BID WC  . pantoprazole  40 mg Oral Daily  . potassium chloride  10 mEq Intravenous Q1 Hr x 2  . sodium chloride  3 mL Intravenous Q12H   Continuous Infusions: . sodium chloride 20 mL/hr at 12/31/13 0800  . sodium chloride 10 mL/hr at 12/31/13 0800  . sodium chloride    . lactated ringers 20 mL/hr at 12/31/13 0800  . phenylephrine (NEO-SYNEPHRINE) Adult infusion Stopped (12/31/13 0400)   PRN Meds:.loratadine, metoprolol, midazolam, morphine injection, ondansetron (ZOFRAN) IV, oxyCODONE, sodium chloride   Physical Exam: General appearance: alert, cooperative and no distress Heart: regular rate and rhythm Lungs: diminished breath sounds bilaterally Extremities: Trace LE edema Wound: Dressed and dry    Lab Results: CBC: Recent Labs  12/31/13 1645 01/01/14 0430  WBC 16.2* 15.5*  HGB 13.7 12.5*  HCT 41.0 37.5*  PLT 185 169   BMET:  Recent Labs  12/31/13  0403 12/31/13 1637 12/31/13 1645 01/01/14 0430  NA 137 136*  --  134*  K 3.6* 3.5*  --  3.9  CL 101 95*  --  95*  CO2 23  --   --  27  GLUCOSE 126* 197*  --  105*  BUN 18 15  --  16  CREATININE 0.70 0.80 0.81 0.81  CALCIUM 7.9*  --   --  8.4    PT/INR:  Recent Labs  12/30/13 1350  LABPROT 16.0*  INR 1.28   Radiology: Dg Chest 1 View  12/30/2013   CLINICAL DATA:  Check Swan-Ganz placement  EXAM: CHEST - 1 VIEW  COMPARISON:  Film from earlier in the same day  FINDINGS: The cardiac shadow remains enlarged. The Swan-Ganz catheter has been withdrawn and repositioned and now lies within the pulmonary outflow tract directed towards the right pulmonary artery. A nasogastric catheter is been placed and lies within the stomach. Mild vascular congestion is noted although this may be related to a poor inspiratory  effort.  IMPRESSION: Swan-Ganz catheter now within the pulmonary outflow tract directed towards a right pulmonary artery.  Nasogastric catheter in satisfactory position.  Vascular congestion although this may be in part due to a poor inspiratory effort.   Electronically Signed   By: Inez Catalina M.D.   On: 12/30/2013 11:00   Dg Chest Portable 1 View In Am  12/31/2013   CLINICAL DATA:  Status post coronary artery bypass graft.  EXAM: PORTABLE CHEST - 1 VIEW  COMPARISON:  December 30, 2013.  FINDINGS: Stable cardiomegaly. Endotracheal tube has been removed. Left-sided chest tube is noted without evidence of pneumothorax. Right internal jugular Swan-Ganz catheter is noted with tip in the expected position of the main pulmonary artery. Minimal right basilar opacity is noted most consistent with subsegmental atelectasis. Nasogastric tube has been removed.  IMPRESSION: Left-sided chest tube is unchanged in position with no evidence of pneumothorax. Slightly increased right basilar opacity is noted most consistent with minimal subsegmental atelectasis.   Electronically Signed   By: Sabino Dick M.D.   On: 12/31/2013 07:49   Dg Chest Portable 1 View  12/30/2013   CLINICAL DATA:  Post CABG  EXAM: PORTABLE CHEST - 1 VIEW  COMPARISON:  12/30/2013  FINDINGS: Cardiomediastinal silhouette is stable. Right Swan-Ganz catheter is unchanged in position. Left chest tube in place. Status post median sternotomy. NG tube in place. Endotracheal tube with tip 4.4 cm above the carina. No pneumothorax. Mild left basilar atelectasis. No infiltrate or pulmonary edema.  IMPRESSION: Stable right Swan-Ganz catheter position. Endotracheal and NG tube in place. Left chest tube. Status post CABG. Mild left basilar atelectasis. No pulmonary edema.   Electronically Signed   By: Lahoma Crocker M.D.   On: 12/30/2013 14:22     Assessment/Plan: S/P Procedure(s) (LRB): CORONARY ARTERY BYPASS GRAFTING (CABG) x three, using left internal mammary artery  and right leg greater sapheneous vein harvested endoscopically - LIMA to LAD, SVG-D, SVG-OM1 (N/A) CV- Maintaining SR, BPs stable.  Continue beta blocker, add ACE-I as able. Endocrine- CBGs stable on low dose Levemir. Not on meds preop.  (A1C=5.5)  Pulm- stable. Continue pulm toilet. Hypokalemia- replace K+.  Expected postop blood loss anemia- H/H stable.  Hopefully can d/c remaining CT and Foley and tx stepdown today.   Gregorio Worley H 01/01/2014 7:53 AM

## 2014-01-01 NOTE — Progress Notes (Signed)
Patient transferred with belongings, medications, and chart to room 2 [address] 22 with nurse. Attached to monitor and new nurse at bedside. No new problems at this time.  Moses Odoherty A  

## 2014-01-02 ENCOUNTER — Inpatient Hospital Stay (HOSPITAL_COMMUNITY): Payer: BC Managed Care – PPO

## 2014-01-02 LAB — CBC
HCT: 34.3 % — ABNORMAL LOW (ref 39.0–52.0)
Hemoglobin: 11.5 g/dL — ABNORMAL LOW (ref 13.0–17.0)
MCH: 30.8 pg (ref 26.0–34.0)
MCHC: 33.5 g/dL (ref 30.0–36.0)
MCV: 92 fL (ref 78.0–100.0)
Platelets: 176 10*3/uL (ref 150–400)
RBC: 3.73 MIL/uL — ABNORMAL LOW (ref 4.22–5.81)
RDW: 13.4 % (ref 11.5–15.5)
WBC: 11.7 10*3/uL — ABNORMAL HIGH (ref 4.0–10.5)

## 2014-01-02 LAB — BASIC METABOLIC PANEL
Anion gap: 15 (ref 5–15)
BUN: 18 mg/dL (ref 6–23)
CO2: 26 mEq/L (ref 19–32)
Calcium: 8.5 mg/dL (ref 8.4–10.5)
Chloride: 93 mEq/L — ABNORMAL LOW (ref 96–112)
Creatinine, Ser: 0.85 mg/dL (ref 0.50–1.35)
GFR calc Af Amer: >90 mL/min (ref >90)
GFR calc non Af Amer: >90 mL/min (ref >90)
Glucose, Bld: 107 mg/dL — ABNORMAL HIGH (ref 70–99)
Potassium: 3.5 mEq/L — ABNORMAL LOW (ref 3.7–5.3)
Sodium: 134 mEq/L — ABNORMAL LOW (ref 137–147)

## 2014-01-02 LAB — GLUCOSE, CAPILLARY
Glucose-Capillary: 99 mg/dL (ref 70–99)
Glucose-Capillary: 90 mg/dL (ref 70–99)

## 2014-01-02 MED ORDER — LACTULOSE 10 GM/15ML PO SOLN
20.0000 g | Freq: Every day | ORAL | Status: AC
  Filled 2014-01-02: qty 30

## 2014-01-02 MED ORDER — POTASSIUM CHLORIDE CRYS ER 20 MEQ PO TBCR
40.0000 meq | EXTENDED_RELEASE_TABLET | Freq: Once | ORAL | Status: AC
  Administered 2014-01-02: 40 meq via ORAL

## 2014-01-02 MED ORDER — POTASSIUM CHLORIDE CRYS ER 20 MEQ PO TBCR
20.0000 meq | EXTENDED_RELEASE_TABLET | Freq: Every day | ORAL | Status: DC
  Administered 2014-01-02: 20 meq via ORAL
  Filled 2014-01-02 (×3): qty 1

## 2014-01-02 MED ORDER — FE FUMARATE-B12-VIT C-FA-IFC PO CAPS
1.0000 | ORAL_CAPSULE | Freq: Every day | ORAL | Status: DC
  Administered 2014-01-02 – 2014-01-03 (×2): 1 via ORAL
  Filled 2014-01-02 (×2): qty 1

## 2014-01-02 NOTE — Progress Notes (Signed)
Patient had two large bowel movements today. [name] R

## 2014-01-02 NOTE — Progress Notes (Signed)
Patient ambulated in hallway twice today on day shift. Patient ambulated independently. Heart rate WNL. Tolerated walk well. No complaints of dyspnea or dizziness. Roxan Hockey, RN

## 2014-01-02 NOTE — Progress Notes (Addendum)
      [address]       [place] [address]             [phone]        3 Days Post-Op Procedure(s) (LRB): CORONARY ARTERY BYPASS GRAFTING (CABG) x three, using left internal mammary artery and right leg greater sapheneous vein harvested endoscopically - LIMA to LAD, SVG-D, SVG-OM1 (N/A)  Subjective: Patient passing flatus but no bowel movement yet. No other complaints.  Objective: Vital signs in last 24 hours: Temp:  [98.1 F (36.7 C)-99.4 F (37.4 C)] 98.1 F (36.7 C) (07/03 0543) Pulse Rate:  [69-86] 84 (07/03 0543) Cardiac Rhythm:  [-] Normal sinus rhythm (07/02 2053) Resp:  [19-26] 24 (07/03 0543) BP: (104-127)/(57-72) 105/62 mmHg (07/03 0543) SpO2:  [91 %-96 %] 91 % (07/03 0543) Weight:  [236 lb 9.6 oz (107.321 kg)] 236 lb 9.6 oz (107.321 kg) (07/03 0543)  Pre op weight 110 kg Current Weight  01/02/14 236 lb 9.6 oz (107.321 kg)       Intake/Output from previous day: 07/02 0701 - 07/03 0700 In: 1210 [P.O.:940; I.V.:120; IV Piggyback:150] Out: 2815 [Urine:2795; Chest Tube:20]   Physical Exam:  Cardiovascular: RRR, no murmurs, gallops, or rubs. Pulmonary: Slightly diminished at bases; no rales, wheezes, or rhonchi. Abdomen: Soft, non tender, bowel sounds present. Extremities: Mild bilateral lower extremity edema. Wounds: Clean and dry.  No erythema or signs of infection.  Lab Results: CBC: Recent Labs  01/01/14 0430 01/02/14 0510  WBC 15.5* 11.7*  HGB 12.5* 11.5*  HCT 37.5* 34.3*  PLT 169 176   BMET:  Recent Labs  01/01/14 0430 01/02/14 0510  NA 134* 134*  K 3.9 3.5*  CL 95* 93*  CO2 27 26  GLUCOSE 105* 107*  BUN 16 18  CREATININE 0.81 0.85  CALCIUM 8.4 8.5    PT/INR:  Lab Results  Component Value Date   INR 1.28 12/30/2013   INR 1.04 12/26/2013   INR 1.05 12/26/2013   ABG:  INR: Will add last result for INR, ABG once components are confirmed Will add last 4 CBG results once components are  confirmed  Assessment/Plan:  1. CV - SR in the 80's. On Lopressor 25 bid and Plavix 75 daily. Monitor BP as may need to adjust Lopressor. 2.  Pulmonary - CXR appears to show no pneumothorax, bibasilar atelectasis, cardiomegaly. Encourage incentive spirometer and flutter valve 3. Volume Overload - On Lasix 40 daily. Below pre op weight but still with some LE edema. 4.  Acute blood loss anemia - H and H decreased to 11.5 and 34.3. Start Trinsicon 5. Supplement potassium 6. Mild hyponatremia-sodium remains 134. Due to diuresis 7. Remove EPW 8. CBGs 97/100/99. On Insulin. Pre op HGA1C 5.5. Stop accu checks, SS, and Insulin 9. LOC in am if no BM today 10. Likely home Sunday  ZIMMERMAN,DONIELLE MPA-C 01/02/2014,7:37 AM  patient examined and medical record reviewed,agree with above note. VAN TRIGT III,Tyrrell Stephens 01/02/2014

## 2014-01-02 NOTE — Progress Notes (Signed)
Pt ambulated approximately 450 ft accompanied by RN and friend. Pt tolerated ambulation well. Pt is now resting in chair with call bell within reach. Will continue to monitor.   Mar Daring R

## 2014-01-02 NOTE — Discharge Summary (Signed)
Physician Discharge Summary       [address]       [place] [address]             [phone]    Patient ID: [name] MRN: [id number] DOB/AGE: 08/20/1950 63 y.o.  Admit date: 12/26/2013 Discharge date: 01/03/2014  Admission Diagnoses: 1. History of CAD (s/p PCI with multiple stents to LAD, left Circumflex, and RCA) 2. History of MI 3. History of hypertension 4. History of hyperlipidemia 5. History of obesity 6. History of OSA 7. History of tobacco abuse 8. History of GI bleed  Discharge Diagnoses:  1. History of CAD (s/p PCI with multiple stents to LAD, left Circumflex, and RCA) 2. History of MI 3. History of hypertension 4. History of hyperlipidemia 5. History of obesity 6. History of OSA 7. History of tobacco abuse 8. History of GI bleed 9. ABL anemia   Procedure (s):  1. Cardiac catheterization done by Dr. Aundra Dubin on 12/26/2013: Hemodynamics:  AO 128/64  LV 138/13  Coronary angiography:  Coronary dominance: right  Left mainstem: 90+% distal left main stenosis.  Left anterior descending (LAD): 95% ostial LAD stenosis. There is a long stented segment in the proximal LAD with probable small chronic dissection towards the distal end of the stented segment. Moderate D1 with 40% proximal stenosis.  Left circumflex (LCx): 90% ostial LCx stenosis. Large branching PLOM with 30% ostial stenosis.  Right coronary artery (RCA): Proximal RCA stent with about 30% distal in-stent restenosis. The RCA is dominant but relatively small.  Left ventriculography: Left ventricular systolic function is normal, LVEF is estimated at 55% without wall motion abnormalities in the RAO projection, there is no significant mitral regurgitation   2.Coronary artery bypass grafting x3 (left internal mammary artery to  LAD, saphenous vein graft to diagonal, saphenous vein graft to  OM1) with endoscopic harvest of right leg greater saphenous vein by Dr. Prescott Gum on  12/30/2013.   History of Presenting Illness: This is a 63 year old nondiabetic,obese(status post bariatric surgery )African American male with prior history of significant coronary disease and previous PCI to the RCA and LAD on chronic Plavix who presented with unstable angina. His symptoms occur with exertion. Cardiac enzymes have been negative. Cardiac angiogram was performed by Dr. Aundra Dubin demonstrates left main stenosis, proximal LAD stenosis, proximal circumflex stenosis and mild disease of the RCA with a patent stent. LV function fairly well preserved. No evidence of AS or MR. The patient was admitted to cardiology and cardiothoracic surgery was consulted for the consideration of coronary artery bypass grafting surgery. Potential risks, complications, and benefits of the surgery were discussed with the patient and he agreed to proceed with surgery. Pre operative carotid duplex US showed no significant carotid artery stenosis bilaterally. He had to wait for Plavix washout. He then underwent a CABG x 3 on 12/30/2013.  Brief Hospital Course:  The patient was extubated the evening of surgery without difficulty. He remained afebrile and hemodynamically stable. Initially, he was paced at 70. Gordy Councilman, a line, chest tubes, and foley were removed early in the post operative course. He wore CPAP at night for his OSA. Lopressor was started and titrated accordingly. He was volume over loaded and diuresed. He was weaned off the insulin drip.  The patient's HGA1C pre op was 5.5. He did have ABL anemia. The last H and H was 11.5 and 34.3. He was started on Trinsicon.The patient was felt surgically stable for transfer from the ICU to  PCTU for further convalescence on 01/01/2013. He continues to progress with cardiac rehab. He was ambulating on room air. He has been tolerating a diet and has had a bowel movement. Epicardial pacing wires and chest tube sutures will be removed prior to discharge. Provided  the patient remains afebrile, hemodynamically stable, and pending morning round evaluation, He is felt surgically stable for discharge today 01/03/2014.    Latest Vital Signs: Blood pressure 109/72, pulse 78, temperature 97.7 F (36.5 C), temperature source Oral, resp. rate 18, height 5\' 8"  (1.727 m), weight 234 lb 3.2 oz (106.232 kg), SpO2 95.00%.  Physical Exam: Cardiovascular: RRR, no murmurs, gallops, or rubs.  Pulmonary: Slightly diminished at bases; no rales, wheezes, or rhonchi.  Abdomen: Soft, non tender, bowel sounds present.  Extremities: Mild bilateral lower extremity edema.  Wounds: Clean and dry. No erythema or signs of infection.   Discharge Condition:Stable  Recent laboratory studies:  Lab Results  Component Value Date   WBC 11.7* 01/02/2014   HGB 11.5* 01/02/2014   HCT 34.3* 01/02/2014   MCV 92.0 01/02/2014   PLT 176 01/02/2014   Lab Results  Component Value Date   NA 137 01/03/2014   K 3.4* 01/03/2014   CL 97 01/03/2014   CO2 26 01/03/2014   CREATININE 0.77 01/03/2014   GLUCOSE 102* 01/03/2014      Diagnostic Studies: Dg Chest 2 View  01/02/2014   CLINICAL DATA:  CABG  EXAM: CHEST  2 VIEW  COMPARISON:  Portal chest radiograph 01/01/2014.  FINDINGS: Interval removal of the right internal jugular sheath. Slight increased aeration in the lungs compared to previous study. The left chest tube has been removed. No pneumothorax. Linear areas of density within the lung bases. No focal regions of consolidation. Median sternotomy and post coronary artery bypass graft changes stable. Cardiac silhouette enlarged. No acute osseous abnormalities.  IMPRESSION: Slight improved aeration within the lungs. Bibasilar atelectasis. No pneumothorax nor focal regions of consolidation.   Electronically Signed   By: Margaree Mackintosh M.D.   On: 01/02/2014 07:57    Discharge Medications:   Medication List    STOP taking these medications       amLODipine 5 MG tablet  Commonly known as:  NORVASC      AXIRON 30 MG/ACT Soln  Generic drug:  Testosterone     losartan-hydrochlorothiazide 100-25 MG per tablet  Commonly known as:  HYZAAR     naproxen 500 MG tablet  Commonly known as:  NAPROSYN     nitroGLYCERIN 0.4 MG SL tablet  Commonly known as:  NITROSTAT      TAKE these medications       aspirin 81 MG tablet  Take 1 tablet (81 mg total) by mouth daily.     atorvastatin 20 MG tablet  Commonly known as:  LIPITOR  Take 1 tablet (20 mg total) by mouth daily.     cholecalciferol 1000 UNITS tablet  Commonly known as:  VITAMIN D  Take 1,000 Units by mouth daily.     clopidogrel 75 MG tablet  Commonly known as:  PLAVIX  Take 75 mg by mouth every morning.     Co Q-10 200 MG Caps  Take 1 capsule by mouth daily.     cyclobenzaprine 10 MG tablet  Commonly known as:  FLEXERIL  Take 10 mg by mouth 3 (three) times daily as needed for muscle spasms.     ferrous sulfate 325 (65 FE) MG tablet  Take 1 tablet (325 mg total)  by mouth daily with breakfast. For one month then stop.     fish oil-omega-3 fatty acids 1000 MG capsule  Take 1 g by mouth 2 (two) times a week. Takes maybe one per week     metoprolol tartrate 25 MG tablet  Commonly known as:  LOPRESSOR  Take 2 tablets (50 mg total) by mouth 2 (two) times daily.     oxyCODONE 5 MG immediate release tablet  Commonly known as:  Oxy IR/ROXICODONE  Take 1-2 tablets (5-10 mg total) by mouth every 4 (four) hours as needed for severe pain.     POMEGRANATE PO  Take 435 mg by mouth daily.     tadalafil 20 MG tablet  Commonly known as:  CIALIS  Take 20 mg by mouth daily as needed.     TURMERIC PO  Take 600 mg by mouth 2 (two) times daily.         The patient has been discharged on:   1.Beta Blocker:  Yes [  x ]                              No   [   ]                              If No, reason:  2.Ace Inhibitor/ARB: Yes [   ]                                     No  [ x   ]                                     If No,  reason:Labile BP  3.Statin:   Yes [ x  ]                  No  [   ]                  If No, reason:  4.Ecasa:  Yes  [ x  ]                  No   [   ]                  If No, reason:  Follow Up Appointments: Follow-up Information   Follow up with Rozann Lesches, MD. (Call for a follow up appointment for 2 weeks)    Specialty:  Cardiology   Contact information:   [place] [address] [phone]       Follow up with VAN Wilber Oliphant, MD. (PA/LAT CXR to be taken (at [facility] which is in the same building as Dr. Everrett Coombe office) one hour prior to office appointment with Dr. Arsenio Katz will mail appointment date and time.)    Specialty:  Cardiothoracic Surgery   Contact information:   [address] [place] [address] [phone]       Signed: Lars Pinks MPA-C 01/03/2014, 8:22 AM

## 2014-01-02 NOTE — Discharge Instructions (Signed)
Activity: 1.May walk up steps                2.No lifting more than ten pounds for four weeks.                 3.No driving for four weeks.                4.Stop any activity that causes chest pain, shortness of breath, dizziness, sweating or excessive weakness.                5.Avoid straining.                6.Continue with your breathing exercises daily.  Diet: Low fat, Low salt diabetic diet  Wound Care: May shower.  Clean wounds with mild soap and water daily. Contact the office at [phone] if any problems arise.   Coronary Artery Bypass Grafting, Care After Refer to this sheet in the next few weeks. These instructions provide you with information on caring for yourself after your procedure. Your health care provider may also give you more specific instructions. Your treatment has been planned according to current medical practices, but problems sometimes occur. Call your health care provider if you have any problems or questions after your procedure. WHAT TO EXPECT AFTER THE PROCEDURE Recovery from surgery will be different for everyone. Some people feel well after 3 or 4 weeks, while for others it takes longer. After your procedure, it is typical to have the following:  Nausea and a lack of appetite.   Constipation.  Weakness and fatigue.   Depression or irritability.   Pain or discomfort at your incision site. HOME CARE INSTRUCTIONS  Take all medicines as directed by your health care provider. Do not stop taking medicines or start any new medicines without first checking with your health care provider.  Take your pulse as directed by your health care provider.  Perform deep breathing as directed by your health care provider. If you were given a device called an incentive spirometer, use it to practice deep breathing several times a day. Support your chest with a pillow or your arms when you take deep breaths or cough.  Keep incision areas clean, dry, and protected.  Remove or change any bandages (dressings) only as directed by your health care provider. You may have skin adhesive strips over the incision areas. Do not take the strips off. They will fall off on their own.  Check incision areas daily for any swelling, redness, or drainage.  If incisions were made in your legs, do the following:  Avoid crossing your legs.   Avoid sitting for long periods of time. Change positions every 30 minutes.   Elevate your legs when you are sitting.  Wear compression stockings as directed by your health care provider. These stockings help keep blood clots from forming in your legs.  Take showers once your health care provider approves. Until then, only take sponge baths. Pat incisions dry. Do not rub incisions with a washcloth or towel. Do not bathe, swim, or use a hot tub until directed by your health care provider.  Eat foods that are high in fiber, such as raw fruits and vegetables, whole grains, beans, and nuts. Meats should be lean cut. Avoid canned, processed, and fried foods.  Drink enough fluids to keep your urine clear or pale yellow.  Weigh yourself every day. This helps identify if you are retaining fluid that may make your heart and lungs work  harder.  Rest and limit activity as directed by your health care provider. You may be instructed to:  Stop any activity at once if you have chest pain, shortness of breath, irregular heartbeats, or dizziness. Get help right away if you have any of these symptoms.  Move around frequently for short periods or take short walks as directed by your health care provider. Increase your activities gradually. You may need physical therapy or cardiac rehabilitation to help strengthen your muscles and build your endurance.  Avoid lifting, pushing, or pulling anything heavier than 10 lb (4.5 kg) for at least 6 weeks after surgery.  Do not drive until your health care provider approves.  Ask your health care provider  when you may return to work.  Ask your health care provider when you may resume sexual activity.  Follow up with your health care provider as directed. SEEK MEDICAL CARE IF:  You have swelling, redness, increasing pain, or drainage at the site of an incision.  You have a fever.  You have swelling in your ankles or legs.  You have pain in your legs.   You gain 2 or more pounds (0.9 kg) a day.  You are nauseous or vomit.  You have diarrhea. SEEK IMMEDIATE MEDICAL CARE IF:  You have chest pain that goes to your jaw or arms.  You have shortness of breath.   You have a fast or irregular heartbeat.   You notice a "clicking" in your breastbone (sternum) when you move.   You have numbness or weakness in your arms or legs.  You feel dizzy or light-headed.  MAKE SURE YOU:  Understand these instructions.  Will watch your condition.  Will get help right away if you are not doing well or get worse. Document Released: 01/06/2005 Document Revised: 06/24/2013 Document Reviewed: 11/26/2012 [facility] Patient Information 2015 [place], [place]. This information is not intended to replace advice given to you by your health care provider. Make sure you discuss any questions you have with your health care provider.

## 2014-01-03 LAB — BASIC METABOLIC PANEL
Anion gap: 14 (ref 5–15)
BUN: 18 mg/dL (ref 6–23)
CALCIUM: 8.3 mg/dL — AB (ref 8.4–10.5)
CO2: 26 meq/L (ref 19–32)
Chloride: 97 mEq/L (ref 96–112)
Creatinine, Ser: 0.77 mg/dL (ref 0.50–1.35)
GFR calc Af Amer: >90 mL/min (ref >90)
GLUCOSE: 102 mg/dL — AB (ref 70–99)
Potassium: 3.4 mEq/L — ABNORMAL LOW (ref 3.7–5.3)
Sodium: 137 mEq/L (ref 137–147)

## 2014-01-03 MED ORDER — METOPROLOL TARTRATE 25 MG PO TABS: 50.0000 mg | ORAL_TABLET | Freq: Two times a day (BID) | ORAL | Status: DC

## 2014-01-03 MED ORDER — POTASSIUM CHLORIDE CRYS ER 20 MEQ PO TBCR
40.0000 meq | EXTENDED_RELEASE_TABLET | Freq: Once | ORAL | Status: AC
  Administered 2014-01-03: 40 meq via ORAL
  Filled 2014-01-03: qty 2

## 2014-01-03 MED ORDER — OXYCODONE HCL 5 MG PO TABS: 5.0000 mg | ORAL_TABLET | ORAL | Status: DC | PRN

## 2014-01-03 MED ORDER — ASPIRIN 81 MG PO TABS: 325.0000 mg | ORAL_TABLET | Freq: Every day | ORAL | Status: DC

## 2014-01-03 MED ORDER — POTASSIUM CHLORIDE CRYS ER 20 MEQ PO TBCR: 40.0000 meq | EXTENDED_RELEASE_TABLET | Freq: Once | ORAL | Status: AC

## 2014-01-03 MED ORDER — FERROUS SULFATE 325 (65 FE) MG PO TABS: 325.0000 mg | ORAL_TABLET | Freq: Every day | ORAL | Status: DC

## 2014-01-03 MED ORDER — ASPIRIN 81 MG PO TABS: 81.0000 mg | ORAL_TABLET | Freq: Every day | ORAL | Status: DC

## 2014-01-03 NOTE — Progress Notes (Addendum)
CT sutures removed per MD order and protocol. Sterri strips applied. Pt tolerated procedure well. Will continue to monitor. Sharyn Lull , RN

## 2014-01-03 NOTE — Progress Notes (Addendum)
      [address]       [place] [address]             [phone]        4 Days Post-Op Procedure(s) (LRB): CORONARY ARTERY BYPASS GRAFTING (CABG) x three, using left internal mammary artery and right leg greater sapheneous vein harvested endoscopically - LIMA to LAD, SVG-D, SVG-OM1 (N/A)  Subjective: Patient had 2 bowel movements. Wants to go home.  Objective: Vital signs in last 24 hours: Temp:  [97.7 F (36.5 C)-98.6 F (37 C)] 97.7 F (36.5 C) (07/04 0544) Pulse Rate:  [78-79] 78 (07/04 0544) Cardiac Rhythm:  [-] Normal sinus rhythm (07/03 2045) Resp:  [18-20] 18 (07/04 0544) BP: (109-137)/(67-80) 109/72 mmHg (07/04 0544) SpO2:  [95 %-96 %] 95 % (07/04 0544) Weight:  [234 lb 3.2 oz (106.232 kg)] 234 lb 3.2 oz (106.232 kg) (07/04 0544)  Pre op weight 110 kg Current Weight  01/03/14 234 lb 3.2 oz (106.232 kg)      Intake/Output from previous day: 07/03 0701 - 07/04 0700 In: 600 [P.O.:600] Out: 1375 [Urine:1375]   Physical Exam:  Cardiovascular: RRR, no murmurs, gallops, or rubs. Pulmonary: Mostly clear; no rales, wheezes, or rhonchi. Abdomen: Soft, non tender, bowel sounds present. Extremities: No lower extremity edema. Wounds: Clean and dry.  No erythema or signs of infection.  Lab Results: CBC:  Recent Labs  01/01/14 0430 01/02/14 0510  WBC 15.5* 11.7*  HGB 12.5* 11.5*  HCT 37.5* 34.3*  PLT 169 176   BMET:   Recent Labs  01/02/14 0510 01/03/14 0325  NA 134* 137  K 3.5* 3.4*  CL 93* 97  CO2 26 26  GLUCOSE 107* 102*  BUN 18 18  CREATININE 0.85 0.77  CALCIUM 8.5 8.3*    PT/INR:  Lab Results  Component Value Date   INR 1.28 12/30/2013   INR 1.04 12/26/2013   INR 1.05 12/26/2013   ABG:  INR: Will add last result for INR, ABG once components are confirmed Will add last 4 CBG results once components are confirmed  Assessment/Plan:  1. CV - SR in the 80's. On Lopressor 25 bid and Plavix 75 daily.  2.  Pulmonary -   Encourage incentive spirometer and flutter valve 3. Volume Overload - On Lasix 40 daily. Below pre op weight but still with some LE edema. 4.  Acute blood loss anemia - H and H decreased to 11.5 and 34.3. Continue Trinsicon 5. Supplement potassium 6. Hyponatremia resolved-sodium up to 137 7. Remove EPW 8.Home later today  ZIMMERMAN,DONIELLE MPA-C 01/03/2014,8:00 AM   I have seen and examined the patient and agree with the assessment and plan as outlined.  D/C home  Chaniya Genter H 01/03/2014 11:05 AM

## 2014-01-03 NOTE — Progress Notes (Signed)
EPWS  Removed per MD order and protocol. Wire ends intact. Pt tolerated procedure well. Vitals stable. Pt resting in bed X 1 hour. Call bell within reach. Bed Alarm set. Will continue to monitor.  

## 2014-01-03 NOTE — Progress Notes (Signed)
Pt discharged per MD order and protocol. Discharge instructions reviewed with patient and all questions answered. Pt given all prescriptions and aware of follow up appointments.  

## 2014-01-06 NOTE — Discharge Summary (Signed)
patient examined and medical record reviewed,agree with above note. VAN TRIGT III,Kelechi Astarita 01/06/2014   

## 2014-01-15 ENCOUNTER — Ambulatory Visit (INDEPENDENT_AMBULATORY_CARE_PROVIDER_SITE_OTHER): Payer: BC Managed Care – PPO | Admitting: Physician Assistant

## 2014-01-15 ENCOUNTER — Encounter (INDEPENDENT_AMBULATORY_CARE_PROVIDER_SITE_OTHER): Payer: Self-pay

## 2014-01-15 VITALS — BP 124/84 | HR 72 | Temp 98.8°F | Resp 18 | Ht 68.5 in | Wt 234.8 lb

## 2014-01-15 DIAGNOSIS — Z4651 Encounter for fitting and adjustment of gastric lap band: Secondary | ICD-10-CM

## 2014-01-15 NOTE — Progress Notes (Signed)
  HISTORY: [name] is a 63 y.o.male who received an AP-Standard lap-band in March 2014 by Dr. Lucia Gaskins. The patient has lost 14 lbs since their last visit in May, and has lost 48 lbs since surgery. He underwent CABG x 3 two weeks ago and is doing very well. He has no further chest pain and actually walked 5 miles yesterday over the course of 2.5 hours. He reports having increasing appetite and tolerance of larger portions since his surgery but no regurgitation or reflux. He'd like a fill today but wants to be very conservative such that he doesn't have obstruction.  VITAL SIGNS: Filed Vitals:   01/15/14 0917  BP: 124/84  Pulse: 72  Temp: 98.8 F (37.1 C)  Resp: 18    PHYSICAL EXAM: Physical exam reveals a very well-appearing 62 y.o.male in no apparent distress Neurologic: Awake, alert, oriented Psych: Bright affect, conversant Respiratory: Breathing even and unlabored. No stridor or wheezing Abdomen: Soft, nontender, nondistended to palpation. Incisions well-healed. No incisional hernias. Port easily palpated. Extremities: Atraumatic, good range of motion.  ASSESMENT: 63 y.o.  male  s/p AP-Standard lap-band.   PLAN: The patient's port was accessed with a 20G Huber needle without difficulty. Clear fluid was aspirated and 0.25 mL saline was added to the port to give a total predicted volume of 3.45 mL. The patient was able to swallow water without difficulty following the procedure and was instructed to take clear liquids for the next 24-48 hours and advance slowly as tolerated. He's scheduled to return in three months. I asked him to contact us should he continue to have significant hunger.

## 2014-01-15 NOTE — Patient Instructions (Signed)

## 2014-01-16 ENCOUNTER — Ambulatory Visit: Payer: BC Managed Care – PPO | Admitting: Cardiology

## 2014-01-21 ENCOUNTER — Encounter: Payer: Self-pay | Admitting: Cardiology

## 2014-01-21 ENCOUNTER — Ambulatory Visit (INDEPENDENT_AMBULATORY_CARE_PROVIDER_SITE_OTHER): Payer: BC Managed Care – PPO | Admitting: Cardiology

## 2014-01-21 VITALS — BP 126/83 | HR 75 | Ht 68.0 in | Wt 232.0 lb

## 2014-01-21 DIAGNOSIS — E785 Hyperlipidemia, unspecified: Secondary | ICD-10-CM

## 2014-01-21 DIAGNOSIS — I2510 Atherosclerotic heart disease of native coronary artery without angina pectoris: Secondary | ICD-10-CM

## 2014-01-21 DIAGNOSIS — I10 Essential (primary) hypertension: Secondary | ICD-10-CM

## 2014-01-21 NOTE — Assessment & Plan Note (Signed)
Multivessel/left main disease as detailed above now status post CABG. He looks to be doing very well recuperating so far. I encouraged him to continue his regular walking and proceed with evaluation for cardiac rehabilitation in August. He will be seeing Dr. Prescott Gum in the next 2-3 weeks. No change to current cardiac regimen. Not entirely clear to me that he will need to stay on Plavix long-term now he is status post CABG.

## 2014-01-21 NOTE — Progress Notes (Signed)
Clinical Summary [name] is a 63 y.o.male not seen by me in the office since October 2013. Recent visit with Dr. Harl Bowie noted in June in the setting of recurrent angina, referred for cardiac catheterization at that time. He underwent cardiac catheterization with Dr. Aundra Dubin on June 26 revealing a complex bifurcation left main lesion extending into the LAD and circumflex, 90% overall, LVEF 55%. He was seen by [facility] and ultimately underwent CABG with LIMA to the LAD, SVG to diagonal, and SVG to OM1 with Dr. Prescott Gum.  He is doing very well, has been walking regularly, will check into cardiac rehabilitation sometime in August. He reports compliance with his medications. Has had some cough, but this is improving. No unusual chest soreness or drainage from his sternal incision site.  Lab work from July showed potassium 3.4, BUN 18, creatinine 0.7, hemoglobin 11.5,  platelets 176. Lipid panel in June showed cholesterol 146, triglycerides 90, HDL 49, LDL 79.  Echocardiogram from June revealed LVEF 55-60% with grade 2 diastolic dysfunction, trivial aortic regurgitation, mild right atrial enlargement, atrial septal aneurysm.  ECG today shows sinus rhythm with poor R-wave progression consistent with previous anterior wall infarct, nonspecific T-wave changes.   Allergies  Allergen Reactions  . Chlorhexidine Itching    Pt. States on last admission CHG wipes made him itch and it had to be washed off.  Uncertain if benadryl was given.    Current Outpatient Prescriptions  Medication Sig Dispense Refill  . aspirin 81 MG tablet Take 1 tablet (81 mg total) by mouth daily.  30 tablet    . atorvastatin (LIPITOR) 20 MG tablet Take 1 tablet (20 mg total) by mouth daily.  30 tablet  6  . cholecalciferol (VITAMIN D) 1000 UNITS tablet Take 1,000 Units by mouth daily.      . clopidogrel (PLAVIX) 75 MG tablet Take 75 mg by mouth every morning.       . Coenzyme Q10 (CO Q-10) 200 MG CAPS Take 1 capsule by mouth  daily.      . cyclobenzaprine (FLEXERIL) 10 MG tablet Take 10 mg by mouth 3 (three) times daily as needed for muscle spasms.      . ferrous sulfate 325 (65 FE) MG tablet Take 1 tablet (325 mg total) by mouth daily with breakfast. For one month then stop.    0  . fish oil-omega-3 fatty acids 1000 MG capsule Take 1 g by mouth 2 (two) times a week. Takes maybe one per week      . metoprolol (LOPRESSOR) 25 MG tablet Take 2 tablets (50 mg total) by mouth 2 (two) times daily.  60 tablet  1  . oxyCODONE (OXY IR/ROXICODONE) 5 MG immediate release tablet Take 1-2 tablets (5-10 mg total) by mouth every 4 (four) hours as needed for severe pain.  30 tablet  0  . Pomegranate, Punica granatum, (POMEGRANATE PO) Take 435 mg by mouth daily.      . tadalafil (CIALIS) 20 MG tablet Take 20 mg by mouth daily as needed.       . TURMERIC PO Take 600 mg by mouth 2 (two) times daily.       No current facility-administered medications for this visit.    Past Medical History  Diagnosis Date  . Coronary atherosclerosis of native coronary artery     Multiple stents - RCA/LAD/diagonal, overlapping DES LAD, stent thrombosis 2005, ultimately CABG  . Essential hypertension, benign   . Peripheral vascular disease   .  Myocardial infarction     Anterior with LAD stent thrombosis 2005  . Arthritis   . Hyperlipemia   . History of GI bleed     On nonsteroidals  . Aortic root dilatation     41 mm by CT 2008, 36 mm by echo 2011  . Obesity   . H/O hiatal hernia   . Sleep apnea     CPAP daily    Past Surgical History  Procedure Laterality Date  . Appendectomy    . Cholecystectomy    . Total knee arthroplasty Right 2006  . Total knee arthroplasty Left 2007  . Elbow arthroscopy Right   . Shoulder arthroscopy Right 4/11  . Cervical fusion  9/10  . Coronary angioplasty with stent placement  8299,3716  . Carpal tunnel release Right 6/12  . Carpal tunnel release  06/06/2011    Procedure: CARPAL TUNNEL RELEASE;  Surgeon:  Cammie Sickle., MD;  Location: [place];  Service: Orthopedics;  Laterality: Right;  . Ulnar nerve transposition  06/06/2011    Procedure: ULNAR NERVE DECOMPRESSION/TRANSPOSITION;  Surgeon: Cammie Sickle., MD;  Location: [place];  Service: Orthopedics;  Laterality: Right;  decompression ulnar nerve right cubital tunnel   . Breath tek h pylori  06/11/2012    Procedure: BREATH TEK H PYLORI;  Surgeon: Shann Medal, MD;  Location: [facility];  Service: General;  Laterality: N/A;  Seth Bake  . Laparoscopic gastric banding  3/14  . Carpal tunnel release Right 04/10/2013    Procedure: REVISION OF CARPAL TUNNEL RELEASE LIGAMENT RIGHT WRIST;  Surgeon: Cammie Sickle., MD;  Location: [place];  Service: Orthopedics;  Laterality: Right;  . Coronary artery bypass graft N/A 12/30/2013    Procedure: CORONARY ARTERY BYPASS GRAFTING (CABG) x three, using left internal mammary artery and right leg greater sapheneous vein harvested endoscopically - LIMA to LAD, SVG-D, SVG-OM1;  Surgeon: Ivin Poot, MD;  Location: [place];  Service: Open Heart Surgery;  Laterality: N/A;    Family History  Problem Relation Age of Onset  . Colon cancer Father   . Colon cancer Maternal Grandmother   . Obesity Mother   . Diabetes Maternal Grandfather     Social History Mr. Epperly reports that he has never smoked. He quit smokeless tobacco use about 10 years ago. His smokeless tobacco use included Chew. Mr. Rigel reports that he drinks alcohol.  Review of Systems No palpitations or syncope. Appetite has been good. Sleeping reasonably well. Other systems reviewed and negative except as outlined.  Physical Examination Filed Vitals:   01/21/14 1454  BP: 126/83  Pulse: 75   Filed Weights   01/21/14 1454  Weight: 232 lb (105.235 kg)   Obese male, appears comfortable at rest. HEENT: Conjunctiva and lids normal, oropharynx clear. Neck: Supple, no elevated  JVP or carotid bruits, no thyromegaly. Lungs: Clear to auscultation, nonlabored breathing at rest. Thorax: Well-healed sternal incision, no erythema or drainage. Cardiac: Regular rate and rhythm, no S3 or significant systolic murmur, no pericardial rub. Abdomen: Soft, obese, nontender, bowel sounds present. Extremities: Trace edema, distal pulses 2+. Skin: Warm and dry. Musculoskeletal: No kyphosis. Neuropsychiatric: Alert and oriented x3, affect grossly appropriate.   Problem List and Plan   CAD, NATIVE VESSEL Multivessel/left main disease as detailed above now status post CABG. He looks to be doing very well recuperating so far. I encouraged him to continue his regular walking and proceed with evaluation for cardiac  rehabilitation in August. He will be seeing Dr. Prescott Gum in the next 2-3 weeks. No change to current cardiac regimen. Not entirely clear to me that he will need to stay on Plavix long-term now he is status post CABG.  Essential hypertension, benign Blood pressure control is reasonable today.  HYPERLIPIDEMIA-MIXED On Lipitor, recent LDL 79.    Satira Sark, M.D., F.A.C.C.

## 2014-01-21 NOTE — Addendum Note (Signed)
Addended by: Orion Modest on: 01/21/2014 03:42 PM   Modules accepted: Orders

## 2014-01-21 NOTE — Patient Instructions (Signed)
Your physician recommends that you schedule a follow-up appointment in: 6 weeks. Your physician recommends that you continue on your current medications as directed. Please refer to the Current Medication list given to you today. 

## 2014-01-21 NOTE — Assessment & Plan Note (Signed)
On Lipitor, recent LDL 79.

## 2014-01-21 NOTE — Assessment & Plan Note (Signed)
Blood pressure control is reasonable today. 

## 2014-01-26 ENCOUNTER — Encounter (HOSPITAL_COMMUNITY): Payer: BC Managed Care – PPO

## 2014-02-02 ENCOUNTER — Ambulatory Visit (INDEPENDENT_AMBULATORY_CARE_PROVIDER_SITE_OTHER): Payer: BC Managed Care – PPO | Admitting: General Surgery

## 2014-02-02 ENCOUNTER — Encounter (HOSPITAL_COMMUNITY): Payer: BC Managed Care – PPO

## 2014-02-02 ENCOUNTER — Encounter (INDEPENDENT_AMBULATORY_CARE_PROVIDER_SITE_OTHER): Payer: Self-pay | Admitting: General Surgery

## 2014-02-02 VITALS — BP 130/76 | HR 74 | Resp 18 | Ht 68.5 in | Wt 227.0 lb

## 2014-02-02 DIAGNOSIS — Z4651 Encounter for fitting and adjustment of gastric lap band: Secondary | ICD-10-CM

## 2014-02-02 NOTE — Progress Notes (Signed)
Chief complaint followup lap band with dysphagia  History: Patient is status post lap band March of 2014. He has had good progressive weight loss. He had a 0.25 cc fill by [facility] on July 16. He did well for about a week but now has progressive intolerance of oral intake not being able to tolerate any solid food and even liquids take a long time and occasionally come back up. He is having heartburn.  Exam: BP 130/76  Pulse 74  Resp 18  Ht 5' 8.5" (1.74 m)  Wt 227 lb (102.967 kg)  BMI 34.01 kg/m2 Weight loss 8 pounds from 716, total weight loss 56 pounds General: No distress Abdomen: Soft nontender port site looks fine  Assessment and plan: Restriction following reasons fill. He is getting liquids down with some difficulty and after discussion elected to remove the one quarter cc that was added 2 weeks ago. After this he felt that the liquids definitely would easier. He will stay on a liquid diet for 2 days and gradually had solid food. He will call for any persistent difficulties otherwise return in about 3 months.

## 2014-02-02 NOTE — Patient Instructions (Signed)
Liquid diet only for 48 hours then cautiously begin solid food with very small bites in chewing very well. If you are not definitely improving toward the middle of the week please call back for a return appointment

## 2014-02-05 ENCOUNTER — Encounter (INDEPENDENT_AMBULATORY_CARE_PROVIDER_SITE_OTHER): Payer: BC Managed Care – PPO

## 2014-02-09 ENCOUNTER — Other Ambulatory Visit: Payer: Self-pay | Admitting: Cardiothoracic Surgery

## 2014-02-09 DIAGNOSIS — I10 Essential (primary) hypertension: Secondary | ICD-10-CM

## 2014-02-11 ENCOUNTER — Encounter: Payer: Self-pay | Admitting: Cardiothoracic Surgery

## 2014-02-11 ENCOUNTER — Ambulatory Visit
Admission: RE | Admit: 2014-02-11 | Discharge: 2014-02-11 | Disposition: A | Discharge status: Home / Self Care | Payer: BC Managed Care – PPO | Source: Ambulatory Visit | Attending: Cardiothoracic Surgery | Admitting: Cardiothoracic Surgery

## 2014-02-11 ENCOUNTER — Ambulatory Visit (INDEPENDENT_AMBULATORY_CARE_PROVIDER_SITE_OTHER): Payer: Self-pay | Admitting: Cardiothoracic Surgery

## 2014-02-11 VITALS — BP 134/87 | HR 72 | Ht 68.5 in | Wt 227.0 lb

## 2014-02-11 DIAGNOSIS — I10 Essential (primary) hypertension: Secondary | ICD-10-CM

## 2014-02-11 DIAGNOSIS — Z951 Presence of aortocoronary bypass graft: Secondary | ICD-10-CM

## 2014-02-11 NOTE — Progress Notes (Signed)
PCP is Rory Percy, MD Referring Provider is Arnoldo Lenis, MD  Chief Complaint  Patient presents with  . F/U CARDIAC    4-5 WK F/U [facility] CABD    HPI: Routine followup one month after multivessel CABG. Patient doing very well. Incisions all healing. No recurrent angina or symptoms of CHF. No significant surgical pain. Patient has been walking up to 5 miles daily. He will be starting outpatient cardiac rehabilitation at [facility] in 2 weeks.  The patient was discharged home on Plavix. His stents however or almost 64 years old and the Plavix can be discontinued after his current prescription supply is finished.. Past Medical History  Diagnosis Date  . Coronary atherosclerosis of native coronary artery     Multiple stents - RCA/LAD/diagonal, overlapping DES LAD, stent thrombosis 2005, ultimately CABG  . Essential hypertension, benign   . Peripheral vascular disease   . Myocardial infarction     Anterior with LAD stent thrombosis 2005  . Arthritis   . Hyperlipemia   . History of GI bleed     On nonsteroidals  . Aortic root dilatation     41 mm by CT 2008, 36 mm by echo 2011  . Obesity   . H/O hiatal hernia   . Sleep apnea     CPAP daily    Past Surgical History  Procedure Laterality Date  . Appendectomy    . Cholecystectomy    . Total knee arthroplasty Right 2006  . Total knee arthroplasty Left 2007  . Elbow arthroscopy Right   . Shoulder arthroscopy Right 4/11  . Cervical fusion  9/10  . Coronary angioplasty with stent placement  8937,[id number]  . Carpal tunnel release Right 6/12  . Carpal tunnel release  06/06/2011    Procedure: CARPAL TUNNEL RELEASE;  Surgeon: Cammie Sickle., MD;  Location: [place];  Service: Orthopedics;  Laterality: Right;  . Ulnar nerve transposition  06/06/2011    Procedure: ULNAR NERVE DECOMPRESSION/TRANSPOSITION;  Surgeon: Cammie Sickle., MD;  Location: [place];  Service: Orthopedics;  Laterality:  Right;  decompression ulnar nerve right cubital tunnel   . Breath tek h pylori  06/11/2012    Procedure: BREATH TEK H PYLORI;  Surgeon: Shann Medal, MD;  Location: [facility];  Service: General;  Laterality: N/A;  Seth Bake  . Laparoscopic gastric banding  3/14  . Carpal tunnel release Right 04/10/2013    Procedure: REVISION OF CARPAL TUNNEL RELEASE LIGAMENT RIGHT WRIST;  Surgeon: Cammie Sickle., MD;  Location: [place];  Service: Orthopedics;  Laterality: Right;  . Coronary artery bypass graft N/A 12/30/2013    Procedure: CORONARY ARTERY BYPASS GRAFTING (CABG) x three, using left internal mammary artery and right leg greater sapheneous vein harvested endoscopically - LIMA to LAD, SVG-D, SVG-OM1;  Surgeon: Ivin Poot, MD;  Location: [place];  Service: Open Heart Surgery;  Laterality: N/A;    Family History  Problem Relation Age of Onset  . Colon cancer Father   . Colon cancer Maternal Grandmother   . Obesity Mother   . Diabetes Maternal Grandfather     Social History History  Substance Use Topics  . Smoking status: Never Smoker   . Smokeless tobacco: Former Systems developer    Types: Chew    Quit date: 07/03/2003  . Alcohol Use: Yes     Comment:  sometimes 4 to 5 beers a day - but not every day    Current  Outpatient Prescriptions  Medication Sig Dispense Refill  . aspirin 81 MG tablet Take 1 tablet (81 mg total) by mouth daily.  30 tablet    . atorvastatin (LIPITOR) 20 MG tablet Take 1 tablet (20 mg total) by mouth daily.  30 tablet  6  . cholecalciferol (VITAMIN D) 1000 UNITS tablet Take 1,000 Units by mouth daily.      . clopidogrel (PLAVIX) 75 MG tablet Take 75 mg by mouth every morning.       . Coenzyme Q10 (CO Q-10) 200 MG CAPS Take 1 capsule by mouth daily.      . ferrous sulfate 325 (65 FE) MG tablet Take 1 tablet (325 mg total) by mouth daily with breakfast. For one month then stop.    0  . fish oil-omega-3 fatty acids 1000 MG capsule Take 1 g by mouth 2  (two) times a week. Takes maybe one per week      . metoprolol (LOPRESSOR) 25 MG tablet Take 2 tablets (50 mg total) by mouth 2 (two) times daily.  60 tablet  1  . Pomegranate, Punica granatum, (POMEGRANATE PO) Take 435 mg by mouth daily.      . tadalafil (CIALIS) 20 MG tablet Take 20 mg by mouth daily as needed.       . TURMERIC PO Take 600 mg by mouth 2 (two) times daily.      . cyclobenzaprine (FLEXERIL) 10 MG tablet Take 10 mg by mouth 3 (three) times daily as needed for muscle spasms.       No current facility-administered medications for this visit.    Allergies  Allergen Reactions  . Chlorhexidine Itching    Pt. States on last admission CHG wipes made him itch and it had to be washed off.  Uncertain if benadryl was given.    Review of Systems improved appetite and strength. Losing weight as desired.  BP 134/87  Pulse 72  Ht 5' 8.5" (1.74 m)  Wt 227 lb (102.967 kg)  BMI 34.01 kg/m2  SpO2 97% Physical Exam Alert and comfortable Lungs clear Heart rhythm regular Sternal incision well-healed Right leg incision has a firm probable collagen scarring without erythema or cellulitis. No fluctuance.  Diagnostic Tests: Chest x-rays clear  Impression: Excellent progress 4 weeks after multivessel CABG. Patient will stop his Plavix after his current supply is finish. He'll continue his other medications.   Plan: Patient return in 3 weeks to check his right leg.

## 2014-02-19 ENCOUNTER — Encounter (HOSPITAL_COMMUNITY)
Admission: RE | Admit: 2014-02-19 | Discharge: 2014-02-19 | Disposition: A | Discharge status: Home / Self Care | Payer: BC Managed Care – PPO | Source: Ambulatory Visit | Attending: Cardiology | Admitting: Cardiology

## 2014-02-19 ENCOUNTER — Encounter (HOSPITAL_COMMUNITY): Payer: Self-pay

## 2014-02-19 VITALS — BP 122/80 | HR 59 | Ht 68.0 in | Wt 234.1 lb

## 2014-02-19 DIAGNOSIS — I2510 Atherosclerotic heart disease of native coronary artery without angina pectoris: Secondary | ICD-10-CM | POA: Insufficient documentation

## 2014-02-19 DIAGNOSIS — Z951 Presence of aortocoronary bypass graft: Secondary | ICD-10-CM

## 2014-02-19 DIAGNOSIS — Z5189 Encounter for other specified aftercare: Secondary | ICD-10-CM | POA: Insufficient documentation

## 2014-02-19 NOTE — Progress Notes (Signed)
Patient was referred to Cardiac Rehab by Dr. Rozann Lesches due to recent CABGx4 on 12/30/13. During orientation advised patient on arrival and appointment times what to wear, what to do before, during and after exercise. Reviewed attendance and class policy. Talked about inclement weather and class consultation policy. Pt is scheduled to start Cardiac Rehab on 02/23/14 at 9:30 am. Pt was advised to come to class 5 minutes before class starts. He was also given instructions on meeting with the dietician and attending the Family Structure classes. Pt is eager to get started. Patient was able to complete 6 minute walk test without any abnormal S/S.

## 2014-02-19 NOTE — Patient Instructions (Signed)
Pt has finished orientation and is scheduled to start CR on 02/23/14 at 9:30 am. Pt has been instructed to arrive to class 15 minutes early for scheduled class. Pt has been instructed to wear comfortable clothing and shoes with rubber soles. Pt has been told to take their medications 1 hour prior to coming to class.  If the patient is not going to attend class, he/she has been instructed to call.

## 2014-02-21 ENCOUNTER — Inpatient Hospital Stay (HOSPITAL_COMMUNITY)
Admission: EM | Admit: 2014-02-21 | Discharge: 2014-02-24 | DRG: 292 | Disposition: A | Discharge status: Home / Self Care | Payer: BC Managed Care – PPO | Attending: Internal Medicine | Admitting: Internal Medicine

## 2014-02-21 ENCOUNTER — Encounter (HOSPITAL_COMMUNITY): Payer: Self-pay | Admitting: Emergency Medicine

## 2014-02-21 ENCOUNTER — Emergency Department (HOSPITAL_COMMUNITY): Payer: BC Managed Care – PPO

## 2014-02-21 DIAGNOSIS — Z7902 Long term (current) use of antithrombotics/antiplatelets: Secondary | ICD-10-CM

## 2014-02-21 DIAGNOSIS — Z981 Arthrodesis status: Secondary | ICD-10-CM

## 2014-02-21 DIAGNOSIS — I480 Paroxysmal atrial fibrillation: Secondary | ICD-10-CM

## 2014-02-21 DIAGNOSIS — E876 Hypokalemia: Secondary | ICD-10-CM | POA: Diagnosis not present

## 2014-02-21 DIAGNOSIS — I509 Heart failure, unspecified: Principal | ICD-10-CM | POA: Diagnosis present

## 2014-02-21 DIAGNOSIS — Z79899 Other long term (current) drug therapy: Secondary | ICD-10-CM

## 2014-02-21 DIAGNOSIS — R0609 Other forms of dyspnea: Secondary | ICD-10-CM | POA: Diagnosis not present

## 2014-02-21 DIAGNOSIS — I739 Peripheral vascular disease, unspecified: Secondary | ICD-10-CM | POA: Diagnosis present

## 2014-02-21 DIAGNOSIS — Z7982 Long term (current) use of aspirin: Secondary | ICD-10-CM

## 2014-02-21 DIAGNOSIS — G4733 Obstructive sleep apnea (adult) (pediatric): Secondary | ICD-10-CM | POA: Diagnosis present

## 2014-02-21 DIAGNOSIS — Z96659 Presence of unspecified artificial knee joint: Secondary | ICD-10-CM

## 2014-02-21 DIAGNOSIS — I2510 Atherosclerotic heart disease of native coronary artery without angina pectoris: Secondary | ICD-10-CM | POA: Diagnosis present

## 2014-02-21 DIAGNOSIS — Z6841 Body Mass Index (BMI) 40.0 and over, adult: Secondary | ICD-10-CM

## 2014-02-21 DIAGNOSIS — E782 Mixed hyperlipidemia: Secondary | ICD-10-CM | POA: Diagnosis present

## 2014-02-21 DIAGNOSIS — M25511 Pain in right shoulder: Secondary | ICD-10-CM | POA: Diagnosis present

## 2014-02-21 DIAGNOSIS — Z951 Presence of aortocoronary bypass graft: Secondary | ICD-10-CM

## 2014-02-21 DIAGNOSIS — I4891 Unspecified atrial fibrillation: Secondary | ICD-10-CM | POA: Diagnosis not present

## 2014-02-21 DIAGNOSIS — I77819 Aortic ectasia, unspecified site: Secondary | ICD-10-CM | POA: Diagnosis present

## 2014-02-21 DIAGNOSIS — I4892 Unspecified atrial flutter: Secondary | ICD-10-CM | POA: Diagnosis not present

## 2014-02-21 DIAGNOSIS — R0602 Shortness of breath: Secondary | ICD-10-CM

## 2014-02-21 DIAGNOSIS — Z9861 Coronary angioplasty status: Secondary | ICD-10-CM

## 2014-02-21 DIAGNOSIS — Z9884 Bariatric surgery status: Secondary | ICD-10-CM

## 2014-02-21 DIAGNOSIS — I10 Essential (primary) hypertension: Secondary | ICD-10-CM | POA: Diagnosis present

## 2014-02-21 DIAGNOSIS — R0989 Other specified symptoms and signs involving the circulatory and respiratory systems: Secondary | ICD-10-CM | POA: Diagnosis not present

## 2014-02-21 DIAGNOSIS — I252 Old myocardial infarction: Secondary | ICD-10-CM

## 2014-02-21 LAB — PROTIME-INR
INR: 1.22 (ref 0.00–1.49)
PROTHROMBIN TIME: 15.4 s — AB (ref 11.6–15.2)

## 2014-02-21 LAB — CBC
HEMATOCRIT: 44.9 % (ref 39.0–52.0)
Hemoglobin: 15.1 g/dL (ref 13.0–17.0)
MCH: 30.3 pg (ref 26.0–34.0)
MCHC: 33.6 g/dL (ref 30.0–36.0)
MCV: 90.2 fL (ref 78.0–100.0)
Platelets: 236 10*3/uL (ref 150–400)
RBC: 4.98 MIL/uL (ref 4.22–5.81)
RDW: 14.2 % (ref 11.5–15.5)
WBC: 10.1 10*3/uL (ref 4.0–10.5)

## 2014-02-21 LAB — I-STAT TROPONIN, ED: Troponin i, poc: 0 ng/mL (ref 0.00–0.08)

## 2014-02-21 LAB — TROPONIN I: Troponin I: <0.3 ng/mL (ref <0.30)

## 2014-02-21 LAB — BASIC METABOLIC PANEL
Anion gap: 15 (ref 5–15)
BUN: 12 mg/dL (ref 6–23)
CO2: 24 meq/L (ref 19–32)
CREATININE: 0.68 mg/dL (ref 0.50–1.35)
Calcium: 9.4 mg/dL (ref 8.4–10.5)
Chloride: 100 mEq/L (ref 96–112)
GFR calc Af Amer: >90 mL/min (ref >90)
GLUCOSE: 116 mg/dL — AB (ref 70–99)
Potassium: 4.5 mEq/L (ref 3.7–5.3)
SODIUM: 139 meq/L (ref 137–147)

## 2014-02-21 LAB — D-DIMER, QUANTITATIVE (NOT AT ARMC): D-Dimer, Quant: 2.33 ug/mL-FEU — ABNORMAL HIGH (ref 0.00–0.48)

## 2014-02-21 LAB — PRO B NATRIURETIC PEPTIDE: PRO B NATRI PEPTIDE: 278.1 pg/mL — AB (ref 0–125)

## 2014-02-21 LAB — TSH: TSH: 1.37 u[IU]/mL (ref 0.350–4.500)

## 2014-02-21 MED ORDER — OMEGA-3-ACID ETHYL ESTERS 1 G PO CAPS
1.0000 g | ORAL_CAPSULE | ORAL | Status: DC
  Administered 2014-02-23: 1 g via ORAL
  Filled 2014-02-21: qty 1

## 2014-02-21 MED ORDER — CLOPIDOGREL BISULFATE 75 MG PO TABS
75.0000 mg | ORAL_TABLET | Freq: Every morning | ORAL | Status: DC
  Administered 2014-02-22: 75 mg via ORAL
  Filled 2014-02-21 (×2): qty 1

## 2014-02-21 MED ORDER — OMEGA-3 FATTY ACIDS 1000 MG PO CAPS: 1.0000 g | ORAL_CAPSULE | ORAL | Status: DC

## 2014-02-21 MED ORDER — IOHEXOL 350 MG/ML SOLN
80.0000 mL | Freq: Once | INTRAVENOUS | Status: AC | PRN
  Administered 2014-02-21: 80 mL via INTRAVENOUS

## 2014-02-21 MED ORDER — FUROSEMIDE 10 MG/ML IJ SOLN
40.0000 mg | Freq: Once | INTRAMUSCULAR | Status: AC
  Administered 2014-02-21: 40 mg via INTRAVENOUS
  Filled 2014-02-21: qty 4

## 2014-02-21 MED ORDER — ASPIRIN 81 MG PO TABS: 81.0000 mg | ORAL_TABLET | Freq: Every day | ORAL | Status: DC

## 2014-02-21 MED ORDER — ASPIRIN 81 MG PO CHEW: 81.0000 mg | CHEWABLE_TABLET | Freq: Every day | ORAL | Status: DC

## 2014-02-21 MED ORDER — FUROSEMIDE 10 MG/ML IJ SOLN: 20.0000 mg | Freq: Once | INTRAMUSCULAR | Status: DC

## 2014-02-21 MED ORDER — ATORVASTATIN CALCIUM 20 MG PO TABS
20.0000 mg | ORAL_TABLET | Freq: Every day | ORAL | Status: DC
  Administered 2014-02-21 – 2014-02-23 (×3): 20 mg via ORAL
  Filled 2014-02-21 (×4): qty 1

## 2014-02-21 MED ORDER — CYCLOBENZAPRINE HCL 10 MG PO TABS
10.0000 mg | ORAL_TABLET | Freq: Three times a day (TID) | ORAL | Status: DC | PRN
  Filled 2014-02-21: qty 1

## 2014-02-21 MED ORDER — VITAMIN D3 25 MCG (1000 UNIT) PO TABS
1000.0000 [IU] | ORAL_TABLET | Freq: Every day | ORAL | Status: DC
  Administered 2014-02-21 – 2014-02-24 (×4): 1000 [IU] via ORAL
  Filled 2014-02-21 (×4): qty 1

## 2014-02-21 MED ORDER — NITROGLYCERIN 0.4 MG SL SUBL: 0.4000 mg | SUBLINGUAL_TABLET | SUBLINGUAL | Status: DC | PRN

## 2014-02-21 MED ORDER — ASPIRIN 81 MG PO CHEW
81.0000 mg | CHEWABLE_TABLET | Freq: Every day | ORAL | Status: DC
  Administered 2014-02-22 – 2014-02-24 (×3): 81 mg via ORAL
  Filled 2014-02-21 (×3): qty 1

## 2014-02-21 MED ORDER — HEPARIN SODIUM (PORCINE) 5000 UNIT/ML IJ SOLN
5000.0000 [IU] | Freq: Three times a day (TID) | INTRAMUSCULAR | Status: DC
  Administered 2014-02-21 – 2014-02-22 (×2): 5000 [IU] via SUBCUTANEOUS
  Filled 2014-02-21 (×5): qty 1

## 2014-02-21 MED ORDER — ACETAMINOPHEN 325 MG PO TABS
650.0000 mg | ORAL_TABLET | ORAL | Status: DC | PRN
  Administered 2014-02-22: 650 mg via ORAL
  Filled 2014-02-21: qty 2

## 2014-02-21 MED ORDER — ASPIRIN 81 MG PO CHEW: 324.0000 mg | CHEWABLE_TABLET | ORAL | Status: AC

## 2014-02-21 MED ORDER — ONDANSETRON HCL 4 MG/2ML IJ SOLN: 4.0000 mg | Freq: Four times a day (QID) | INTRAMUSCULAR | Status: DC | PRN

## 2014-02-21 MED ORDER — ASPIRIN 300 MG RE SUPP: 300.0000 mg | RECTAL | Status: AC

## 2014-02-21 MED ORDER — SODIUM CHLORIDE 0.9 % IJ SOLN
3.0000 mL | INTRAMUSCULAR | Status: DC | PRN
  Administered 2014-02-21: 3 mL via INTRAVENOUS

## 2014-02-21 MED ORDER — SODIUM CHLORIDE 0.9 % IJ SOLN
3.0000 mL | Freq: Two times a day (BID) | INTRAMUSCULAR | Status: DC
  Administered 2014-02-22 – 2014-02-24 (×3): 3 mL via INTRAVENOUS

## 2014-02-21 MED ORDER — SODIUM CHLORIDE 0.9 % IV SOLN: 250.0000 mL | INTRAVENOUS | Status: DC | PRN

## 2014-02-21 MED ORDER — METOPROLOL TARTRATE 50 MG PO TABS
50.0000 mg | ORAL_TABLET | Freq: Two times a day (BID) | ORAL | Status: DC
  Administered 2014-02-21: 50 mg via ORAL
  Filled 2014-02-21 (×3): qty 1

## 2014-02-21 NOTE — Progress Notes (Signed)
Patient setup on hospital provided machine using nasal mask and auto titration mode (min 4, max 20).  2L oxygen bleed in.  Patient is familiar with equipment and procedure.

## 2014-02-21 NOTE — H&P (Signed)
Patient ID: [name] MRN: [id number], DOB/AGE: 04-May-1951   Admit date: 02/21/2014   Primary Physician: Rory Percy, MD Primary Cardiologist: Dr. Domenic Polite  Pt. Profile:  [name] is a 63 y.o. male with a history of CAD s/p remote stenting and recent CABG x3 on 12/30/2013, PVD, HTN, hx of GI bleed, aortic root dilatation, obesity, hiatal hernia and sleep apnea who presented to [facility] today with chest pain and exertional dyspnea.      The patient was on a recent car trip to [facility] and on the way home he noticed right shoulder pain that slowly moved to his central chest and now into his left shoulder. The pain is is not reminiscent of his previous cardiac chest pain which was a central chest tightness. He admits to recently carrying heavy luggage and groceries which he was advised not to do after his open heart surgery. The pain feels as if it would be relieved if he could "pop" his shoulder back into place. The patient also admits to shortness of breath that is worse with exertion. He has recently been walking and was having no exertional symptoms before today and yesterday. While in the room he states that he felt short of breath when simply trying to pick up his water cup. He had mild orthopnea but no PND or LE edema. He does have most OSA and wears a CPAP nightly. When on vacation he ate a lot of salty sea food and he had previously had been carefully watching his salt intake.   Problem List  Past Medical History  Diagnosis Date  . Coronary atherosclerosis of native coronary artery     Multiple stents - RCA/LAD/diagonal, overlapping DES LAD, stent thrombosis 2005, ultimately CABG  . Essential hypertension, benign   . Peripheral vascular disease   . Myocardial infarction     Anterior with LAD stent thrombosis 2005  . Arthritis   . Hyperlipemia   . History of GI bleed     On nonsteroidals  . Aortic root dilatation     41 mm by CT 2008, 36 mm by echo 2011  .  Obesity   . H/O hiatal hernia   . Sleep apnea     CPAP daily    Past Surgical History  Procedure Laterality Date  . Appendectomy    . Cholecystectomy    . Total knee arthroplasty Right 2006  . Total knee arthroplasty Left 2007  . Elbow arthroscopy Right   . Shoulder arthroscopy Right 4/11  . Cervical fusion  9/10  . Coronary angioplasty with stent placement  3662,9476  . Carpal tunnel release Right 6/12  . Carpal tunnel release  06/06/2011    Procedure: CARPAL TUNNEL RELEASE;  Surgeon: Cammie Sickle., MD;  Location: [place];  Service: Orthopedics;  Laterality: Right;  . Ulnar nerve transposition  06/06/2011    Procedure: ULNAR NERVE DECOMPRESSION/TRANSPOSITION;  Surgeon: Cammie Sickle., MD;  Location: [place];  Service: Orthopedics;  Laterality: Right;  decompression ulnar nerve right cubital tunnel   . Breath tek h pylori  06/11/2012    Procedure: BREATH TEK H PYLORI;  Surgeon: Shann Medal, MD;  Location: [facility];  Service: General;  Laterality: N/A;  Seth Bake  . Laparoscopic gastric banding  3/14  . Carpal tunnel release Right 04/10/2013    Procedure: REVISION OF CARPAL TUNNEL RELEASE LIGAMENT RIGHT WRIST;  Surgeon: Cammie Sickle., MD;  Location: [facility];  Service: Orthopedics;  Laterality: Right;  . Coronary artery bypass graft N/A 12/30/2013    Procedure: CORONARY ARTERY BYPASS GRAFTING (CABG) x three, using left internal mammary artery and right leg greater sapheneous vein harvested endoscopically - LIMA to LAD, SVG-D, SVG-OM1;  Surgeon: Ivin Poot, MD;  Location: [place];  Service: Open Heart Surgery;  Laterality: N/A;  . Cardiac surgery       Allergies  Allergies  Allergen Reactions  . Chlorhexidine Itching    Pt. States on last admission CHG wipes made him itch and it had to be washed off.  Uncertain if benadryl was given.     Home Medications  Prior to Admission medications   Medication Sig  Start Date End Date Taking? Authorizing Provider  aspirin 81 MG tablet Take 1 tablet (81 mg total) by mouth daily. 01/03/14  Yes Donielle Liston Alba, PA-C  atorvastatin (LIPITOR) 20 MG tablet Take 1 tablet (20 mg total) by mouth daily. 12/23/13  Yes Satira Sark, MD  cholecalciferol (VITAMIN D) 1000 UNITS tablet Take 1,000 Units by mouth daily.   Yes Historical Provider, MD  clopidogrel (PLAVIX) 75 MG tablet Take 75 mg by mouth every morning.    Yes Historical Provider, MD  Coenzyme Q10 (CO Q-10) 200 MG CAPS Take 1 capsule by mouth daily.   Yes Historical Provider, MD  cyclobenzaprine (FLEXERIL) 10 MG tablet Take 10 mg by mouth 3 (three) times daily as needed for muscle spasms.   Yes Historical Provider, MD  fish oil-omega-3 fatty acids 1000 MG capsule Take 1 g by mouth 2 (two) times a week.    Yes Historical Provider, MD  metoprolol (LOPRESSOR) 25 MG tablet Take 2 tablets (50 mg total) by mouth 2 (two) times daily. 01/03/14  Yes Donielle Liston Alba, PA-C  Pomegranate, Punica granatum, (POMEGRANATE PO) Take 435 mg by mouth daily.   Yes Historical Provider, MD  TURMERIC PO Take 600 mg by mouth 2 (two) times daily.   Yes Historical Provider, MD  tadalafil (CIALIS) 20 MG tablet Take 20 mg by mouth daily as needed for erectile dysfunction.     Historical Provider, MD    Family History  Family History  Problem Relation Age of Onset  . Colon cancer Father   . Colon cancer Maternal Grandmother   . Obesity Mother   . Diabetes Maternal Grandfather    Family Status  Relation Status Death Age  . Father Deceased   . Mother Deceased      Social History  History   Social History  . Marital Status: Married    Spouse Name: N/A    Number of Children: N/A  . Years of Education: N/A   Occupational History  . Not on file.   Social History Main Topics  . Smoking status: Never Smoker   . Smokeless tobacco: Former Systems developer    Types: Chew    Quit date: 07/03/2003  . Alcohol Use: 0.6 oz/week     1 Cans of beer per week     Comment:  sometimes 4 to 5 beers a day - but not every day  . Drug Use: No  . Sexual Activity: Not on file   Other Topics Concern  . Not on file   Social History Narrative  . No narrative on file     All other systems reviewed and are otherwise negative except as noted above.  Physical Exam  Blood pressure 115/62, pulse 87, temperature 98.3 F (36.8  C), temperature source Oral, resp. rate 25, height 5\' 8"  (1.727 m), weight 231 lb (104.781 kg), SpO2 95.00%.  General: Pleasant, NAD. Obese  Psych: Normal affect. Neuro: Alert and oriented X 3. Moves all extremities spontaneously. HEENT: Normal  Neck: Supple without bruits or JVD. Lungs:  Resp regular and unlabored, mild crackles at bases.  Heart: RRR no s3, s4, or murmurs. Abdomen: Soft, non-tender, non-distended, BS + x 4.  Extremities: No clubbing, cyanosis or edema. DP/PT/Radials 2+ and equal bilaterally.  Labs   Lab Results  Component Value Date   WBC 10.1 02/21/2014   HGB 15.1 02/21/2014   HCT 44.9 02/21/2014   MCV 90.2 02/21/2014   PLT 236 02/21/2014    Recent Labs Lab 02/21/14 1148  NA 139  K 4.5  CL 100  CO2 24  BUN 12  CREATININE 0.68  CALCIUM 9.4  GLUCOSE 116*   Lab Results  Component Value Date   CHOL 146 12/27/2013   HDL 49 12/27/2013   LDLCALC 79 12/27/2013   TRIG 90 12/27/2013   Lab Results  Component Value Date   DDIMER 2.33* 02/21/2014     Radiology/Studies  Dg Chest 2 View  02/21/2014   CLINICAL DATA:  Left-sided chest pain with shortness of breath for 4 days; history of previous MI and CABG  EXAM: CHEST  2 VIEW  COMPARISON:  PA and lateral chest of February 11, 2014  FINDINGS: The lungs are adequately inflated. There is no focal infiltrate. There are coarse interstitial markings bilaterally. There small bilateral pleural effusions layering posteriorly. The cardiac silhouette is mildly enlarged. The central pulmonary vascularity is engorged. There are 8 intact sternal  wires present. The bony thorax exhibits no acute abnormalities.  IMPRESSION: Mild CHF with interstitial edema and small bilateral pleural effusions.      Dg Chest 2 View  02/11/2014   CLINICAL DATA:  CABG 12/30/2013, hypertension  EXAM: CHEST  2 VIEW  COMPARISON:  01/09/2014  FINDINGS: There is no focal parenchymal opacity, pleural effusion, or pneumothorax. The heart and mediastinal contours are unremarkable. Recent CABG.  The osseous structures are unremarkable.  IMPRESSION: No active cardiopulmonary disease.      2D ECHO: 12/27/2013 LV EF: 55% - 60% Study Conclusions - Left ventricle: The cavity size was at the upper limits of normal. Wall thickness was normal. Systolic function was normal. The estimated ejection fraction was in the range of 55% to 60%. Wall motion was normal; there were no regional wall motion abnormalities. Features are consistent with a pseudonormal left ventricular filling pattern, with concomitant abnormal relaxation and increased filling pressure (grade 2 diastolic dysfunction). - Aortic valve: There was trivial regurgitation. - Right atrium: The atrium was mildly dilated. - Atrial septum: A patent foramen ovale cannot be excluded. There was an atrial septal aneurysm.   Ct Angio Chest Pe W/cm &/or Wo Cm  02/21/2014   CLINICAL DATA:  Chest pain and dyspnea  EXAM: CT ANGIOGRAPHY CHEST WITH CONTRAST  TECHNIQUE: Multidetector CT imaging of the chest was performed using the standard protocol during bolus administration of intravenous contrast. Multiplanar CT image reconstructions and MIPs were obtained to evaluate the vascular anatomy.  CONTRAST:  34mL OMNIPAQUE IOHEXOL 350 MG/ML SOLN  COMPARISON:  Chest radiograph February 21, 2014  FINDINGS: There is no demonstrable pulmonary embolus. There is dilatation of the ascending aorta with a maximum transverse diameter of 4.4 x 4.3 cm. There is no thoracic aortic dissection. There are multiple foci of coronary artery  calcification. Patient  has had a coronary artery bypass graft.  There are small pleural effusions bilaterally with bibasilar consolidation. There is no appreciable interstitial edema. Heart is enlarged with a small pericardial effusion. There is left ventricular hypertrophy. Findings do suggest a degree of pulmonary venous hypertension.  There is no appreciable thoracic adenopathy.  In the visualized upper abdomen, a lap band is positioned at the gastric cardia.  There are no blastic or lytic bone lesions. Visualized thyroid appears normal.  Review of the MIP images confirms the above findings.  IMPRESSION: No demonstrable pulmonary embolus. Mild dilatation of the ascending thoracic aorta with a maximum transverse diameter 4.4 x 4.3 cm.  Evidence of volume overload with bilateral effusions, cardiomegaly, and pulmonary venous hypertension. There is patchy consolidation in the lung bases. There is a small pericardial effusion.  Lap band in gastric cardia region.  No appreciable adenopathy  ECG  Mild inferior ST leads, that are unchanged from previous Normal ECG  ASSESSMENT AND PLAN  CARDEN TEEL is a 63 y.o. male with a history of CAD s/p remote stenting and recent CABG x3 on 12/30/2013, PVD, HTN, hx of GI bleed, aortic root dilatation, obesity, hiatal hernia and sleep apnea who presented to [facility] today with chest pain and exertional dyspnea.     Right shoulder and chest pain- very atypical and not reminiscent of prior cardiac chest pain. -- Troponin neg x1 and EKG with mild ST elevation in inferior and lateral leads. Similar to previous. No heparin for now. -- Continue to cycle troponin and monitor EKG. -- Continue ASA/Plavix, statin and BB. The patient was discharged home on Plavix. His stents however or almost 64 years old and the Plavix can be discontinued after his current prescription supply is finished per Dr. Nils Pyle. Will continue for now  SOB- D dimer elevated but no evidence of PE on  CTA. However, CT with evidence of volume overload: bilateral effusions, cardiomegaly, and pulmonary venous hypertension. There is patchy consolidation in the lung bases and a small pericardial effusion. BNP slightly elevated at 278 -- 2D ECHO: 12/27/2013 with EF: 55% - 60% and G2DD. Mild RA dilation and atrial septal aneurysm. -- WIll admit and repeat 2D ECHO in AM -- Likely mildly volume overloaded in the setting of dietary indescretion on vacation. Will give 40mg  IV Lasix tonight and once again tomorrow. Strict I/Os  HTN- continue metoprolol 50 BID  OSA- CPAP   Signed, Perry Mount, PA-C 02/21/2014, 3:49 PM  Pager [phone]  I have seen, examined the patient, and reviewed the above assessment and plan.  Changes to above are made where necessary.  The patient presents with acute CHF and SOB.  I suspect dietary indiscretion while on vacation (he says he ate a lot of seafood) as the cause.  At this time, I also cannot rule out acute ischemia. I will therefore admit for overnight observation/ cycle CMs and diuresis.   Obtain an echo. If echo is low risk and symptoms resolve with diuresis then I think that patient can be discharged tomorrow.  If however his symptoms persists after diuresis then further CV risk stratification may be required.  Co Sign: Thompson Grayer, MD 02/21/2014 5:37 PM

## 2014-02-21 NOTE — ED Notes (Signed)
Patient returned from xray, monitor reapplied 

## 2014-02-21 NOTE — ED Notes (Signed)
Reports pain is with movement, recent open heart surgery performed here at [facility] after he had a cath.

## 2014-02-21 NOTE — ED Provider Notes (Signed)
CSN: [id number]     Arrival date & time 02/21/14  1133 History   First MD Initiated Contact with Patient 02/21/14 1149     Chief Complaint  Patient presents with  . Shoulder Pain  . Chest Pain     (Consider location/radiation/quality/duration/timing/severity/associated sxs/prior Treatment) The history is provided by the patient and medical records. No language interpreter was used.   [name] Is a 63 year old male with past medical history of coronary artery disease, hypertension, peripheral vascular disease, previous MI, history of GI bleed, aortic root dilatation, obesity, hiatal hernia and sleep apnea. He is status post triple bypass CABG performed on 12/30/2013. She presents to the emergency department chief complaint of chest pain and exertional dyspnea. He states that on Wednesday [place]. He Wednesday with pain in the right shoulder. He states that the pain seemed to get better with some movement of the arm however would always return. He states that the next day the pain radiated to the right front of his shoulder. He also noticed exertional dyspnea. He states  Saturday the pain moved centrally and then began to wrap around the left side of his chest. He states that the pain is now constant however worse with exertion. He also becomes short of with minimal exertion. Past Medical History  Diagnosis Date  . Coronary atherosclerosis of native coronary artery     Multiple stents - RCA/LAD/diagonal, overlapping DES LAD, stent thrombosis 2005, ultimately CABG  . Essential hypertension, benign   . Peripheral vascular disease   . Myocardial infarction     Anterior with LAD stent thrombosis 2005  . Arthritis   . Hyperlipemia   . History of GI bleed     On nonsteroidals  . Aortic root dilatation     41 mm by CT 2008, 36 mm by echo 2011  . Obesity   . H/O hiatal hernia   . Sleep apnea     CPAP daily   Past Surgical History  Procedure Laterality Date   . Appendectomy    . Cholecystectomy    . Total knee arthroplasty Right 2006  . Total knee arthroplasty Left 2007  . Elbow arthroscopy Right   . Shoulder arthroscopy Right 4/11  . Cervical fusion  9/10  . Coronary angioplasty with stent placement  9702,[id number]  . Carpal tunnel release Right 6/12  . Carpal tunnel release  06/06/2011    Procedure: CARPAL TUNNEL RELEASE;  Surgeon: Cammie Sickle., MD;  Location: [place];  Service: Orthopedics;  Laterality: Right;  . Ulnar nerve transposition  06/06/2011    Procedure: ULNAR NERVE DECOMPRESSION/TRANSPOSITION;  Surgeon: Cammie Sickle., MD;  Location: [place];  Service: Orthopedics;  Laterality: Right;  decompression ulnar nerve right cubital tunnel   . Breath tek h pylori  06/11/2012    Procedure: BREATH TEK H PYLORI;  Surgeon: Shann Medal, MD;  Location: [facility];  Service: General;  Laterality: N/A;  Seth Bake  . Laparoscopic gastric banding  3/14  . Carpal tunnel release Right 04/10/2013    Procedure: REVISION OF CARPAL TUNNEL RELEASE LIGAMENT RIGHT WRIST;  Surgeon: Cammie Sickle., MD;  Location: [place];  Service: Orthopedics;  Laterality: Right;  . Coronary artery bypass graft N/A 12/30/2013    Procedure: CORONARY ARTERY BYPASS GRAFTING (CABG) x three, using left internal mammary artery and right leg greater sapheneous vein harvested endoscopically - LIMA to LAD, SVG-D, SVG-OM1;  Surgeon:  Ivin Poot, MD;  Location: [facility];  Service: Open Heart Surgery;  Laterality: N/A;  . Cardiac surgery     Family History  Problem Relation Age of Onset  . Colon cancer Father   . Colon cancer Maternal Grandmother   . Obesity Mother   . Diabetes Maternal Grandfather    History  Substance Use Topics  . Smoking status: Never Smoker   . Smokeless tobacco: Former Systems developer    Types: Chew    Quit date: 07/03/2003  . Alcohol Use: 0.6 oz/week    1 Cans of beer per week     Comment:   sometimes 4 to 5 beers a day - but not every day    Review of Systems   Ten systems reviewed and are negative for acute change, except as noted in the HPI.    Allergies  Chlorhexidine  Home Medications   Prior to Admission medications   Medication Sig Start Date End Date Taking? Authorizing Provider  aspirin 81 MG tablet Take 1 tablet (81 mg total) by mouth daily. 01/03/14  Yes Donielle Liston Alba, PA-C  atorvastatin (LIPITOR) 20 MG tablet Take 1 tablet (20 mg total) by mouth daily. 12/23/13  Yes Satira Sark, MD  cholecalciferol (VITAMIN D) 1000 UNITS tablet Take 1,000 Units by mouth daily.   Yes Historical Provider, MD  clopidogrel (PLAVIX) 75 MG tablet Take 75 mg by mouth every morning.    Yes Historical Provider, MD  Coenzyme Q10 (CO Q-10) 200 MG CAPS Take 1 capsule by mouth daily.   Yes Historical Provider, MD  cyclobenzaprine (FLEXERIL) 10 MG tablet Take 10 mg by mouth 3 (three) times daily as needed for muscle spasms.   Yes Historical Provider, MD  fish oil-omega-3 fatty acids 1000 MG capsule Take 1 g by mouth 2 (two) times a week.    Yes Historical Provider, MD  metoprolol (LOPRESSOR) 25 MG tablet Take 2 tablets (50 mg total) by mouth 2 (two) times daily. 01/03/14  Yes Donielle Liston Alba, PA-C  Pomegranate, Punica granatum, (POMEGRANATE PO) Take 435 mg by mouth daily.   Yes Historical Provider, MD  TURMERIC PO Take 600 mg by mouth 2 (two) times daily.   Yes Historical Provider, MD  tadalafil (CIALIS) 20 MG tablet Take 20 mg by mouth daily as needed for erectile dysfunction.     Historical Provider, MD   BP 112/67  Pulse 80  Temp(Src) 98.3 F (36.8 C) (Oral)  Resp 23  Ht 5\' 8"  (1.727 m)  Wt 231 lb (104.781 kg)  BMI 35.13 kg/m2  SpO2 95% Physical Exam  Nursing note and vitals reviewed. Constitutional: He is oriented to person, place, and time. He appears well-developed and well-nourished. No distress.  HENT:  Head: Normocephalic and atraumatic.  Eyes: Conjunctivae  are normal. No scleral icterus.  Neck: Normal range of motion. Neck supple. No JVD present.  Cardiovascular: Normal rate, regular rhythm and normal heart sounds.   No pedal edema  Pulmonary/Chest: Effort normal. No respiratory distress. He has rales (Crackles BL bases). He exhibits no tenderness.  tachypnea  Abdominal: Soft. There is no tenderness.  Musculoskeletal: Normal range of motion. He exhibits no edema.  Neurological: He is alert and oriented to person, place, and time.  Skin: Skin is warm and dry. He is not diaphoretic.  Psychiatric: His behavior is normal.    ED Course  Procedures (including critical care time) Labs Review Labs Reviewed  PROTIME-INR - Abnormal; Notable for the following:  Prothrombin Time 15.4 (*)    All other components within normal limits  CBC  BASIC METABOLIC PANEL  PRO B NATRIURETIC PEPTIDE  D-DIMER, QUANTITATIVE  I-STAT TROPOININ, ED    Imaging Review No results found.   EKG Interpretation   Date/Time:  Saturday February 21 2014 11:40:58 EDT Ventricular Rate:  82 PR Interval:  184 QRS Duration: 96 QT Interval:  382 QTC Calculation: 446 R Axis:   78 Text Interpretation:  Normal sinus rhythm Normal ECG ED PHYSICIAN  INTERPRETATION AVAILABLE IN [facility] Confirmed by TEST, Record  ([id number]) on 02/23/2014 7:30:37 AM      MDM   Final diagnoses:  Shortness of breath  Essential hypertension, benign  Morbid obesity  S/P CABG x 3  Acute congestive heart failure, unspecified congestive heart failure type  PAF (paroxysmal atrial fibrillation)    Patient with multiple RF. Doubt acs as the  Cause of his sxs considering his progressive pain and SOB over the past 4 days. More likley would be PE or CHF. He has no known hx of CHF and his last echo showed Normal LVEF. EKG is normal, unchanged and without signs of ischemia   Patient D-DImer elevated to 2.25, Pro BNP is only slightly elevated and nonspecific. Will proceed to CTA. nCXR  shows mild chf/Pulmonary edema/pleural effusions   CTA negative for PE. + pulmonary edema and htn. Concern for chf. I have consulted with Dr. Rayann Heman who will consult on the patient.  Patient seen in shared visit with attending physician.  Dr. Rayann Heman will admit for diuresis and new echo. Negative troponin and labs otherwise non-acute. The patient appears reasonably stabilized for admission considering the current resources, flow, and capabilities available in the ED at this time, and I doubt any other [facility] requiring further screening and/or treatment in the ED prior to admission.  I personally reviewed the imaging tests through PACS system. I have reviewed and interpreted Lab values. I reviewed available ER/hospitalization records through the [place], [place] 02/24/14 0071

## 2014-02-21 NOTE — ED Notes (Signed)
Patient transported to X-ray 

## 2014-02-21 NOTE — ED Notes (Signed)
Patient to CAT scan

## 2014-02-21 NOTE — ED Notes (Signed)
Pt reports left shoulder pain yesterday, today chest pain and right shoulder pain too. sts sob when exerting

## 2014-02-22 DIAGNOSIS — I4891 Unspecified atrial fibrillation: Secondary | ICD-10-CM

## 2014-02-22 DIAGNOSIS — I4892 Unspecified atrial flutter: Secondary | ICD-10-CM | POA: Diagnosis present

## 2014-02-22 LAB — BASIC METABOLIC PANEL
ANION GAP: 13 (ref 5–15)
BUN: 14 mg/dL (ref 6–23)
CALCIUM: 9.1 mg/dL (ref 8.4–10.5)
CO2: 27 mEq/L (ref 19–32)
CREATININE: 0.76 mg/dL (ref 0.50–1.35)
Chloride: 99 mEq/L (ref 96–112)
GFR calc Af Amer: >90 mL/min (ref >90)
Glucose, Bld: 97 mg/dL (ref 70–99)
Potassium: 3.7 mEq/L (ref 3.7–5.3)
Sodium: 139 mEq/L (ref 137–147)

## 2014-02-22 LAB — LIPID PANEL
Cholesterol: 115 mg/dL (ref 0–200)
HDL: 52 mg/dL (ref >39)
LDL Cholesterol: 50 mg/dL (ref 0–99)
TRIGLYCERIDES: 63 mg/dL (ref <150)
Total CHOL/HDL Ratio: 2.2 RATIO
VLDL: 13 mg/dL (ref 0–40)

## 2014-02-22 LAB — TROPONIN I: Troponin I: <0.3 ng/mL (ref <0.30)

## 2014-02-22 LAB — CBC
HCT: 42.6 % (ref 39.0–52.0)
Hemoglobin: 14.1 g/dL (ref 13.0–17.0)
MCH: 29.7 pg (ref 26.0–34.0)
MCHC: 33.1 g/dL (ref 30.0–36.0)
MCV: 89.9 fL (ref 78.0–100.0)
PLATELETS: 224 10*3/uL (ref 150–400)
RBC: 4.74 MIL/uL (ref 4.22–5.81)
RDW: 14.2 % (ref 11.5–15.5)
WBC: 8.5 10*3/uL (ref 4.0–10.5)

## 2014-02-22 LAB — PROTIME-INR
INR: 1.32 (ref 0.00–1.49)
PROTHROMBIN TIME: 16.4 s — AB (ref 11.6–15.2)

## 2014-02-22 LAB — HEMOGLOBIN A1C
Hgb A1c MFr Bld: 5.2 % (ref <5.7)
MEAN PLASMA GLUCOSE: 103 mg/dL (ref <117)

## 2014-02-22 MED ORDER — CETYLPYRIDINIUM CHLORIDE 0.05 % MT LIQD
7.0000 mL | Freq: Two times a day (BID) | OROMUCOSAL | Status: DC
  Administered 2014-02-24: 7 mL via OROMUCOSAL

## 2014-02-22 MED ORDER — AMIODARONE HCL 200 MG PO TABS
200.0000 mg | ORAL_TABLET | Freq: Two times a day (BID) | ORAL | Status: DC
  Filled 2014-02-22 (×2): qty 1

## 2014-02-22 MED ORDER — METOPROLOL TARTRATE 50 MG PO TABS
75.0000 mg | ORAL_TABLET | Freq: Two times a day (BID) | ORAL | Status: DC
  Administered 2014-02-22 – 2014-02-24 (×5): 75 mg via ORAL
  Filled 2014-02-22 (×7): qty 1

## 2014-02-22 MED ORDER — CHLORHEXIDINE GLUCONATE 0.12 % MT SOLN: 15.0000 mL | Freq: Two times a day (BID) | OROMUCOSAL | Status: DC

## 2014-02-22 MED ORDER — APIXABAN 5 MG PO TABS
5.0000 mg | ORAL_TABLET | Freq: Two times a day (BID) | ORAL | Status: DC
  Administered 2014-02-22 – 2014-02-24 (×5): 5 mg via ORAL
  Filled 2014-02-22 (×6): qty 1

## 2014-02-22 MED ORDER — FUROSEMIDE 10 MG/ML IJ SOLN
20.0000 mg | Freq: Once | INTRAMUSCULAR | Status: AC
  Administered 2014-02-22: 20 mg via INTRAVENOUS
  Filled 2014-02-22: qty 2

## 2014-02-22 MED ORDER — AMIODARONE HCL IN DEXTROSE 360-4.14 MG/200ML-% IV SOLN
60.0000 mg/h | INTRAVENOUS | Status: AC
  Administered 2014-02-22 (×2): 60 mg/h via INTRAVENOUS
  Filled 2014-02-22: qty 200

## 2014-02-22 MED ORDER — AMIODARONE HCL IN DEXTROSE 360-4.14 MG/200ML-% IV SOLN
30.0000 mg/h | INTRAVENOUS | Status: DC
  Administered 2014-02-22: 30 mg/h via INTRAVENOUS
  Filled 2014-02-22 (×6): qty 200

## 2014-02-22 MED ORDER — AMIODARONE LOAD VIA INFUSION
150.0000 mg | Freq: Once | INTRAVENOUS | Status: AC
  Administered 2014-02-22: 150 mg via INTRAVENOUS
  Filled 2014-02-22: qty 83.34

## 2014-02-22 NOTE — Progress Notes (Signed)
Amiodarone Drug - Drug Interaction Consult Note  Recommendations: -Monitor statin therapy closely for any muscle pain or weakness, may consider decrease in dose since lipids well controlled. -Monitor for s/sx of bleeding while on Eliquis -Consider decreasing dose of metoprolol back down to home dose and monitor HR closely  Amiodarone is metabolized by the cytochrome P450 system and therefore has the potential to cause many drug interactions. Amiodarone has an average plasma half-life of 50 days (range 20 to 100 days).   There is potential for drug interactions to occur several weeks or months after stopping treatment and the onset of drug interactions may be slow after initiating amiodarone.   [x]  Statins: Increased risk of myopathy. Simvastatin- restrict dose to 20mg              daily. Other statins: counsel patients to report any muscle pain or weakness immediately.  [x]  Anticoagulants: Amiodarone can increase anticoagulant effect. Consider warfarin dose reduction. Patients should be monitored closely and the dose of anticoagulant altered accordingly, remembering that amiodarone levels take several weeks to stabilize.  []  Antiepileptics: Amiodarone can increase plasma concentration of phenytoin, the dose should be reduced. Note that small changes in phenytoin dose can result in large changes in levels. Monitor patient and counsel on signs of toxicity.  [x]  Beta blockers: increased risk of bradycardia, AV block and myocardial depression. Sotalol - avoid concomitant use.  []   Calcium channel blockers (diltiazem and verapamil): increased risk of bradycardia, AV block and myocardial depression.  []   Cyclosporine: Amiodarone increases levels of cyclosporine. Reduced dose of cyclosporine is recommended.  []  Digoxin dose should be halved when amiodarone is started.  []  Diuretics: increased risk of cardiotoxicity if hypokalemia occurs.  []  Oral hypoglycemic agents (glyburide, glipizide,  glimepiride): increased risk of hypoglycemia. Patient's glucose levels should be monitored closely when initiating amiodarone therapy.   []  Drugs that prolong the QT interval:  Torsades de pointes risk may be increased with concurrent use - avoid if possible.  Monitor QTc, also keep magnesium/potassium WNL if concurrent therapy can't be avoided. Marland Kitchen Antibiotics: e.g. fluoroquinolones, erythromycin. . Antiarrhythmics: e.g. quinidine, procainamide, disopyramide, sotalol. . Antipsychotics: e.g. phenothiazines, haloperidol.  . Lithium, tricyclic antidepressants, and methadone.  Thank Mable Fill  02/22/2014 9:36 AM

## 2014-02-22 NOTE — Progress Notes (Signed)
Patient brought his home CPAP to wear tonight. The CPAP has no frayed wires and was setup for the patient. Patient is able to apply the CPAP on his own.  RT will continue to monitor.

## 2014-02-22 NOTE — Discharge Instructions (Signed)

## 2014-02-22 NOTE — Progress Notes (Signed)
Utilization Review Completed.   Jathan Balling, RN, BSN Nurse Case Manager  

## 2014-02-22 NOTE — Progress Notes (Signed)
Pt Qtc at 460 ms per monitor Tech. Strip saved. contiues on Afib.

## 2014-02-22 NOTE — Progress Notes (Signed)
Patient ID: [name], male   DOB: 08-Oct-1950, 63 y.o.   MRN: [id number]    Subjective:  Denies SSCP, palpitations or Dyspnea Slept well   Objective:  Filed Vitals:   02/21/14 1730 02/21/14 1836 02/21/14 2100 02/22/14 0541  BP: 128/81 118/66 138/70 120/69  Pulse: 95 95 99 73  Temp:  100.2 F (37.9 C) 98.6 F (37 C) 98.5 F (36.9 C)  TempSrc:  Oral    Resp: 30 25 16 18   Height:      Weight:      SpO2: 96% 97% 98% 97%    Intake/Output from previous day:  Intake/Output Summary (Last 24 hours) at 02/22/14 0755 Last data filed at 02/22/14 0500  Gross per 24 hour  Intake      9 ml  Output   2100 ml  Net  -2091 ml    Physical Exam: Affect appropriate Obese white male  HEENT: normal Neck supple with no adenopathy JVP normal no bruits no thyromegaly Lungs clear with no wheezing and good diaphragmatic motion Heart:  S1/S2 no murmur, no rub, gallop or click PMI normal Abdomen: benighn, BS positve, no tenderness, no AAA no bruit.  No HSM or HJR Distal pulses intact with no bruits No edema Neuro non-focal Skin warm and dry No muscular weakness   Lab Results: Basic Metabolic Panel:  Recent Labs  02/21/14 1148 02/22/14 0630  NA 139 139  K 4.5 3.7  CL 100 99  CO2 24 27  GLUCOSE 116* 97  BUN 12 14  CREATININE 0.68 0.76  CALCIUM 9.4 9.1   CBC:  Recent Labs  02/21/14 1148 02/22/14 0630  WBC 10.1 8.5  HGB 15.1 14.1  HCT 44.9 42.6  MCV 90.2 89.9  PLT 236 224   Cardiac Enzymes:  Recent Labs  02/21/14 1915 02/22/14 02/22/14 0630  TROPONINI <0.30 <0.30 <0.30   D-Dimer:  Recent Labs  02/21/14 1148  DDIMER 2.33*   Fasting Lipid Panel:  Recent Labs  02/22/14 0630  CHOL 115  HDL 52  LDLCALC 50  TRIG 63  CHOLHDL 2.2   Thyroid Function Tests:  Recent Labs  02/21/14 1915  TSH 1.370    Imaging: Dg Chest 2 View  02/21/2014   CLINICAL DATA:  Left-sided chest pain with shortness of breath for 4 days; history of previous MI and  CABG  EXAM: CHEST  2 VIEW  COMPARISON:  PA and lateral chest of February 11, 2014  FINDINGS: The lungs are adequately inflated. There is no focal infiltrate. There are coarse interstitial markings bilaterally. There small bilateral pleural effusions layering posteriorly. The cardiac silhouette is mildly enlarged. The central pulmonary vascularity is engorged. There are 8 intact sternal wires present. The bony thorax exhibits no acute abnormalities.  IMPRESSION: Mild CHF with interstitial edema and small bilateral pleural effusions.   Electronically Signed   By: David  Martinique   On: 02/21/2014 13:32   Ct Angio Chest Pe W/cm &/or Wo Cm  02/21/2014   CLINICAL DATA:  Chest pain and dyspnea  EXAM: CT ANGIOGRAPHY CHEST WITH CONTRAST  TECHNIQUE: Multidetector CT imaging of the chest was performed using the standard protocol during bolus administration of intravenous contrast. Multiplanar CT image reconstructions and MIPs were obtained to evaluate the vascular anatomy.  CONTRAST:  50mL OMNIPAQUE IOHEXOL 350 MG/ML SOLN  COMPARISON:  Chest radiograph February 21, 2014  FINDINGS: There is no demonstrable pulmonary embolus. There is dilatation of the ascending aorta with a maximum transverse diameter of 4.4  x 4.3 cm. There is no thoracic aortic dissection. There are multiple foci of coronary artery calcification. Patient has had a coronary artery bypass graft.  There are small pleural effusions bilaterally with bibasilar consolidation. There is no appreciable interstitial edema. Heart is enlarged with a small pericardial effusion. There is left ventricular hypertrophy. Findings do suggest a degree of pulmonary venous hypertension.  There is no appreciable thoracic adenopathy.  In the visualized upper abdomen, a lap band is positioned at the gastric cardia.  There are no blastic or lytic bone lesions. Visualized thyroid appears normal.  Review of the MIP images confirms the above findings.  IMPRESSION: No demonstrable pulmonary  embolus. Mild dilatation of the ascending thoracic aorta with a maximum transverse diameter 4.4 x 4.3 cm.  Evidence of volume overload with bilateral effusions, cardiomegaly, and pulmonary venous hypertension. There is patchy consolidation in the lung bases. There is a small pericardial effusion.  Lap band in gastric cardia region.  No appreciable adenopathy.   Electronically Signed   By: Lowella Grip M.D.   On: 02/21/2014 14:55    Cardiac Studies:  ECG:  SR LAD nonspecific ST changes    Telemetry:  Periods of PAF not sustained  Echo:  Pending   Medications:   . antiseptic oral rinse  7 mL Mouth Rinse q12n4p  . aspirin  324 mg Oral NOW   Or  . aspirin  300 mg Rectal NOW  . aspirin  81 mg Oral Daily  . atorvastatin  20 mg Oral Daily  . cholecalciferol  1,000 Units Oral Daily  . clopidogrel  75 mg Oral q morning - 10a  . heparin  5,000 Units Subcutaneous 3 times per day  . metoprolol  50 mg Oral BID  . [START ON 02/23/2014] omega-3 acid ethyl esters  1 g Oral Once per day on Mon Thu  . sodium chloride  3 mL Intravenous Q12H       Assessment/Plan:  CHF:  Echo pending over 2 L diuresis    Improved  PAF:  New diagnosis  Not sustained CHADVASC 3  For now favor increase beta blocker and short course of amiodarone with outpatient monitoring before commiting to anticoagulation Contributing to CHF  Check atrial sizes with echo  CAD:  No chest pain r/o ASA  Chol Statin   Jenkins Rouge 02/22/2014, 7:55 AM

## 2014-02-22 NOTE — Progress Notes (Signed)
Subjective:   Objective: Vital signs in last 24 hours: Temp:  [98.3 F (36.8 C)-100.2 F (37.9 C)] 98.5 F (36.9 C) (08/23 0541) Pulse Rate:  [73-99] 73 (08/23 0541) Resp:  [16-31] 18 (08/23 0541) BP: (111-140)/(55-81) 120/69 mmHg (08/23 0541) SpO2:  [94 %-99 %] 97 % (08/23 0541) Weight:  [231 lb (104.781 kg)] 231 lb (104.781 kg) (08/22 1142)    Intake/Output from previous day: 08/22 0701 - 08/23 0700 In: 9 [I.V.:9] Out: 2100 [Urine:2100] Intake/Output this shift:    Medications Current Facility-Administered Medications  Medication Dose Route Frequency Provider Last Rate Last Dose  . 0.9 %  sodium chloride infusion  250 mL Intravenous PRN Perry Mount, PA-C      . acetaminophen (TYLENOL) tablet 650 mg  650 mg Oral Q4H PRN Perry Mount, PA-C      . antiseptic oral rinse (CPC / CETYLPYRIDINIUM CHLORIDE 0.05%) solution 7 mL  7 mL Mouth Rinse q12n4p Thompson Grayer, MD      . aspirin chewable tablet 324 mg  324 mg Oral NOW Perry Mount, PA-C       Or  . aspirin suppository 300 mg  300 mg Rectal NOW Perry Mount, PA-C      . aspirin chewable tablet 81 mg  81 mg Oral Daily Thompson Grayer, MD      . atorvastatin (LIPITOR) tablet 20 mg  20 mg Oral Daily Perry Mount, PA-C   20 mg at 02/21/14 2134  . cholecalciferol (VITAMIN D) tablet 1,000 Units  1,000 Units Oral Daily Perry Mount, PA-C   1,000 Units at 02/21/14 2134  . clopidogrel (PLAVIX) tablet 75 mg  75 mg Oral q morning - 10a Perry Mount, PA-C      . cyclobenzaprine (FLEXERIL) tablet 10 mg  10 mg Oral TID PRN Perry Mount, PA-C      . heparin injection 5,000 Units  5,000 Units Subcutaneous 3 times per day Perry Mount, PA-C   5,000 Units at 02/22/14 [phone]  . metoprolol (LOPRESSOR) tablet 50 mg  50 mg Oral BID Perry Mount, PA-C   50 mg at 02/21/14 2134  . nitroGLYCERIN (NITROSTAT) SL tablet 0.4 mg  0.4 mg Sublingual Q5 Min x 3 PRN Perry Mount, PA-C      . Derrill Memo ON 02/23/2014] omega-3 acid ethyl esters (LOVAZA)  capsule 1 g  1 g Oral Once per day on Mon Thu Thompson Grayer, MD      . ondansetron [facility]) injection 4 mg  4 mg Intravenous Q6H PRN Perry Mount, PA-C      . sodium chloride 0.9 % injection 3 mL  3 mL Intravenous Q12H Perry Mount, PA-C      . sodium chloride 0.9 % injection 3 mL  3 mL Intravenous PRN Perry Mount, PA-C   3 mL at 02/21/14 2140    PE:   Lab Results:   Recent Labs  02/21/14 1148 02/22/14 0630  WBC 10.1 8.5  HGB 15.1 14.1  HCT 44.9 42.6  PLT 236 224   BMET  Recent Labs  02/21/14 1148  NA 139  K 4.5  CL 100  CO2 24  GLUCOSE 116*  BUN 12  CREATININE 0.68  CALCIUM 9.4   PT/INR  Recent Labs  02/21/14 1148  LABPROT 15.4*  INR 1.22    Assessment/Plan  Active Problems:   Shortness of breath  Right shoulder and chest pain-   very atypical and not reminiscent of prior cardiac chest pain.  Troponin negative x3.  SOB-  D dimer elevated but no evidence of PE on CTA. However, CT with evidence of volume overload: bilateral effusions, cardiomegaly, and pulmonary venous hypertension. There is patchy consolidation in the lung bases and a small pericardial effusion. BNP slightly elevated at 278  -- 2D ECHO: 12/27/2013 with EF: 55% - 60% and G2DD. Mild RA dilation and atrial septal aneurysm.  -- 2D ECHO pending  -- Net fluids: -2.1L    PAF: Review of telemetry shows the patient is having short runs of a-Fib.  CHADSVASc= 3.  He is On ASA and plavix.  Recommend adding NOAC and stopping ASA.    CAD:  ASA, plavix HTN- BP well controlled.  On metoprolol 50 BID  OSA- CPAP  HLD:  Statin and lovaza     LOS: 1 day    Tome Wilson PA-C 02/22/2014 7:28 AM

## 2014-02-23 ENCOUNTER — Encounter (HOSPITAL_COMMUNITY): Payer: BC Managed Care – PPO

## 2014-02-23 DIAGNOSIS — I2510 Atherosclerotic heart disease of native coronary artery without angina pectoris: Secondary | ICD-10-CM | POA: Diagnosis present

## 2014-02-23 DIAGNOSIS — I739 Peripheral vascular disease, unspecified: Secondary | ICD-10-CM | POA: Diagnosis present

## 2014-02-23 DIAGNOSIS — R0609 Other forms of dyspnea: Secondary | ICD-10-CM | POA: Diagnosis present

## 2014-02-23 DIAGNOSIS — Z951 Presence of aortocoronary bypass graft: Secondary | ICD-10-CM | POA: Diagnosis not present

## 2014-02-23 DIAGNOSIS — I77819 Aortic ectasia, unspecified site: Secondary | ICD-10-CM | POA: Diagnosis present

## 2014-02-23 DIAGNOSIS — Z96659 Presence of unspecified artificial knee joint: Secondary | ICD-10-CM | POA: Diagnosis not present

## 2014-02-23 DIAGNOSIS — E876 Hypokalemia: Secondary | ICD-10-CM | POA: Diagnosis not present

## 2014-02-23 DIAGNOSIS — Z79899 Other long term (current) drug therapy: Secondary | ICD-10-CM | POA: Diagnosis not present

## 2014-02-23 DIAGNOSIS — I509 Heart failure, unspecified: Secondary | ICD-10-CM | POA: Diagnosis present

## 2014-02-23 DIAGNOSIS — Z7902 Long term (current) use of antithrombotics/antiplatelets: Secondary | ICD-10-CM | POA: Diagnosis not present

## 2014-02-23 DIAGNOSIS — I379 Nonrheumatic pulmonary valve disorder, unspecified: Secondary | ICD-10-CM

## 2014-02-23 DIAGNOSIS — G4733 Obstructive sleep apnea (adult) (pediatric): Secondary | ICD-10-CM | POA: Diagnosis present

## 2014-02-23 DIAGNOSIS — I4891 Unspecified atrial fibrillation: Secondary | ICD-10-CM | POA: Diagnosis not present

## 2014-02-23 DIAGNOSIS — Z6841 Body Mass Index (BMI) 40.0 and over, adult: Secondary | ICD-10-CM | POA: Diagnosis not present

## 2014-02-23 DIAGNOSIS — Z9861 Coronary angioplasty status: Secondary | ICD-10-CM | POA: Diagnosis not present

## 2014-02-23 DIAGNOSIS — Z9884 Bariatric surgery status: Secondary | ICD-10-CM | POA: Diagnosis not present

## 2014-02-23 DIAGNOSIS — E782 Mixed hyperlipidemia: Secondary | ICD-10-CM | POA: Diagnosis present

## 2014-02-23 DIAGNOSIS — I4892 Unspecified atrial flutter: Secondary | ICD-10-CM | POA: Diagnosis not present

## 2014-02-23 DIAGNOSIS — I10 Essential (primary) hypertension: Secondary | ICD-10-CM | POA: Diagnosis present

## 2014-02-23 DIAGNOSIS — I252 Old myocardial infarction: Secondary | ICD-10-CM | POA: Diagnosis not present

## 2014-02-23 DIAGNOSIS — Z7982 Long term (current) use of aspirin: Secondary | ICD-10-CM | POA: Diagnosis not present

## 2014-02-23 DIAGNOSIS — Z981 Arthrodesis status: Secondary | ICD-10-CM | POA: Diagnosis not present

## 2014-02-23 DIAGNOSIS — M25511 Pain in right shoulder: Secondary | ICD-10-CM | POA: Diagnosis present

## 2014-02-23 LAB — BASIC METABOLIC PANEL
Anion gap: 13 (ref 5–15)
BUN: 16 mg/dL (ref 6–23)
CHLORIDE: 98 meq/L (ref 96–112)
CO2: 27 mEq/L (ref 19–32)
Calcium: 9 mg/dL (ref 8.4–10.5)
Creatinine, Ser: 0.91 mg/dL (ref 0.50–1.35)
GFR calc non Af Amer: 88 mL/min — ABNORMAL LOW (ref >90)
Glucose, Bld: 103 mg/dL — ABNORMAL HIGH (ref 70–99)
POTASSIUM: 3.5 meq/L — AB (ref 3.7–5.3)
Sodium: 138 mEq/L (ref 137–147)

## 2014-02-23 MED ORDER — SENNA 8.6 MG PO TABS
1.0000 | ORAL_TABLET | Freq: Every day | ORAL | Status: DC
  Filled 2014-02-23: qty 1

## 2014-02-23 MED ORDER — AMIODARONE HCL 200 MG PO TABS
400.0000 mg | ORAL_TABLET | Freq: Two times a day (BID) | ORAL | Status: DC
  Administered 2014-02-23 – 2014-02-24 (×3): 400 mg via ORAL
  Filled 2014-02-23 (×4): qty 2

## 2014-02-23 MED ORDER — POTASSIUM CHLORIDE CRYS ER 20 MEQ PO TBCR
40.0000 meq | EXTENDED_RELEASE_TABLET | Freq: Once | ORAL | Status: AC
  Administered 2014-02-23: 40 meq via ORAL
  Filled 2014-02-23: qty 2

## 2014-02-23 MED ORDER — BISACODYL 5 MG PO TBEC
5.0000 mg | DELAYED_RELEASE_TABLET | Freq: Every day | ORAL | Status: DC
  Administered 2014-02-23: 5 mg via ORAL
  Filled 2014-02-23 (×2): qty 1

## 2014-02-23 NOTE — Progress Notes (Signed)
  Echocardiogram 2D Echocardiogram has been performed.  Darlina Sicilian M 02/23/2014, 10:53 AM

## 2014-02-23 NOTE — Progress Notes (Addendum)
Subjective: No chest pain, no SOB.  Objective: Vital signs in last 24 hours: Temp:  [97.9 F (36.6 C)-98.7 F (37.1 C)] 97.9 F (36.6 C) (08/24 0524) Pulse Rate:  [65-120] 65 (08/24 0524) Resp:  [16-18] 16 (08/24 0524) BP: (110-131)/(66-73) 118/73 mmHg (08/24 0524) SpO2:  [96 %-99 %] 97 % (08/24 0524) Weight change:  Last BM Date: 02/20/14 Intake/Output from previous day: -2657 08/23 0701 - 08/24 0700 In: 643 [P.O.:640; I.V.:3] Out: 3300 [Urine:3300] Intake/Output this shift:    PE: General:Pleasant affect, NAD Skin:Warm and dry, brisk capillary refill HEENT:normocephalic, sclera clear, mucus membranes moist Neck:supple, no JVD, no bruits  Heart:S1S2 RRR without murmur, gallup, rub or click Lungs:clear without rales, rhonchi, or wheezes NTI:RWER, non tender, + BS, do not palpate liver spleen or masses Ext:no lower ext edema, 2+ pedal pulses, 2+ radial pulses Neuro:alert and oriented, MAE, follows commands, + facial symmetry   Lab Results:  Recent Labs  02/21/14 1148 02/22/14 0630  WBC 10.1 8.5  HGB 15.1 14.1  HCT 44.9 42.6  PLT 236 224   BMET  Recent Labs  02/22/14 0630 02/23/14 0355  NA 139 138  K 3.7 3.5*  CL 99 98  CO2 27 27  GLUCOSE 97 103*  BUN 14 16  CREATININE 0.76 0.91  CALCIUM 9.1 9.0    Recent Labs  02/22/14 02/22/14 0630  TROPONINI <0.30 <0.30    Lab Results  Component Value Date   CHOL 115 02/22/2014   HDL 52 02/22/2014   LDLCALC 50 02/22/2014   TRIG 63 02/22/2014   CHOLHDL 2.2 02/22/2014   Lab Results  Component Value Date   HGBA1C 5.2 02/21/2014     Lab Results  Component Value Date   TSH 1.370 02/21/2014    Hepatic Function Panel No results found for this basename: PROT, ALBUMIN, AST, ALT, ALKPHOS, BILITOT, BILIDIR, IBILI,  in the last 72 hours  Recent Labs  02/22/14 0630  CHOL 115   No results found for this basename: PROTIME,  in the last 72 hours     Studies/Results: Dg Chest 2 View  02/21/2014    CLINICAL DATA:  Left-sided chest pain with shortness of breath for 4 days; history of previous MI and CABG  EXAM: CHEST  2 VIEW  COMPARISON:  PA and lateral chest of February 11, 2014  FINDINGS: The lungs are adequately inflated. There is no focal infiltrate. There are coarse interstitial markings bilaterally. There small bilateral pleural effusions layering posteriorly. The cardiac silhouette is mildly enlarged. The central pulmonary vascularity is engorged. There are 8 intact sternal wires present. The bony thorax exhibits no acute abnormalities.  IMPRESSION: Mild CHF with interstitial edema and small bilateral pleural effusions.   Electronically Signed   By: David  Martinique   On: 02/21/2014 13:32   Ct Angio Chest Pe W/cm &/or Wo Cm  02/21/2014   CLINICAL DATA:  Chest pain and dyspnea  EXAM: CT ANGIOGRAPHY CHEST WITH CONTRAST  TECHNIQUE: Multidetector CT imaging of the chest was performed using the standard protocol during bolus administration of intravenous contrast. Multiplanar CT image reconstructions and MIPs were obtained to evaluate the vascular anatomy.  CONTRAST:  40mL OMNIPAQUE IOHEXOL 350 MG/ML SOLN  COMPARISON:  Chest radiograph February 21, 2014  FINDINGS: There is no demonstrable pulmonary embolus. There is dilatation of the ascending aorta with a maximum transverse diameter of 4.4 x 4.3 cm. There is no thoracic aortic dissection. There are multiple foci of coronary  artery calcification. Patient has had a coronary artery bypass graft.  There are small pleural effusions bilaterally with bibasilar consolidation. There is no appreciable interstitial edema. Heart is enlarged with a small pericardial effusion. There is left ventricular hypertrophy. Findings do suggest a degree of pulmonary venous hypertension.  There is no appreciable thoracic adenopathy.  In the visualized upper abdomen, a lap band is positioned at the gastric cardia.  There are no blastic or lytic bone lesions. Visualized thyroid appears  normal.  Review of the MIP images confirms the above findings.  IMPRESSION: No demonstrable pulmonary embolus. Mild dilatation of the ascending thoracic aorta with a maximum transverse diameter 4.4 x 4.3 cm.  Evidence of volume overload with bilateral effusions, cardiomegaly, and pulmonary venous hypertension. There is patchy consolidation in the lung bases. There is a small pericardial effusion.  Lap band in gastric cardia region.  No appreciable adenopathy.   Electronically Signed   By: Lowella Grip M.D.   On: 02/21/2014 14:55    Medications: I have reviewed the patient's current medications. Scheduled Meds: . antiseptic oral rinse  7 mL Mouth Rinse q12n4p  . apixaban  5 mg Oral BID  . aspirin  81 mg Oral Daily  . atorvastatin  20 mg Oral Daily  . cholecalciferol  1,000 Units Oral Daily  . clopidogrel  75 mg Oral q morning - 10a  . metoprolol  75 mg Oral BID  . omega-3 acid ethyl esters  1 g Oral Once per day on Mon Thu  . sodium chloride  3 mL Intravenous Q12H   Continuous Infusions: . amiodarone 30 mg/hr (02/22/14 2136)   PRN Meds:.sodium chloride, acetaminophen, cyclobenzaprine, nitroGLYCERIN, ondansetron (ZOFRAN) IV, sodium chloride  Assessment/Plan: 63 y.o. male with a history of CAD s/p remote stenting and recent CABG x3 on 12/30/2013, PVD, HTN, hx of GI bleed, aortic root dilatation, obesity, hiatal hernia and sleep apnea who presented to [facility] today with chest pain and exertional dyspnea   Right shoulder and chest pain-  very atypical and not reminiscent of prior cardiac chest pain. Troponin negative x3. But symptoms completely resolved now.  Poss related to PAF? Vs. HF SOB- CHF   D dimer elevated but no evidence of PE on CTA. However, CT with evidence of volume overload: bilateral effusions, cardiomegaly, and pulmonary venous hypertension. Mild widening of thoracic aorta There is patchy consolidation in the lung bases and a small pericardial effusion. BNP slightly elevated at  278 - pt on vacation with salty foods/seafood. -- 2D ECHO: 12/27/2013 with EF: 55% - 60% and G2DD. Mild RA dilation and atrial septal aneurysm.  -- 2D ECHO pending  -- Net fluids: since admit -4748L no weights since admit, has rec'd 2 doses of lasix.  PAF: New Dx. Review of telemetry shows the patient is having short runs of a-Fib. CHADSVASc= 3. He is On ASA and plavix. Per Dr. Johnsie Cancel" For now favor increase beta blocker and short course of amiodarone with outpatient monitoring before commiting to anticoagulation-" though eliquis is started..  This is contributing to CHF.. Maintaining SR since 1200 pm yesterday- on IV amiodarone, change to po- 400 mg BID for 2 weeks then 200 daily-   CAD: ASA, plavix  HTN- BP well controlled. On metoprolol 50 BID  OSA- CPAP  HLD: Statin and lovaza WCT- rate irreg and 120 - 5 beat epeisodes Hypokalemia-replace- 40 meq   LOS: 2 days   Time spent with pt. :15 minutes. [facility]  Nurse Practitioner Certified Pager [phone]  or after 5pm and on weekends call [phone] 02/23/2014, 8:15 AM  Patient seen, examined. Available data reviewed. Agree with findings, assessment, and plan as outlined by Cecilie Kicks, PA-C. The patient is feeling better and is now maintaining sinus rhythm on IV amiodarone. 2D Echo in progress. Breathing improved. Exam reveals alert, oriented male in NAD. Heart RRR, lungs CTA, no peripheral edema.   Plan as above. Reviewed history carefully. Would use strategy of eliquis and low-dose ASA after recent CABG with PAF. Do not see role for continuing clopidogrel and bleeding risk of triple therapy too high. Will monitor another 24 hours with transition from IV to oral amiodarone and anticipate d/c in am.  Sherren Mocha, M.D. 02/23/2014 10:28 AM

## 2014-02-23 NOTE — Progress Notes (Signed)
Pt has home cppa and places self on/off.  RT checked in and left water to refill humidifier.  Rt will continue to monitor.

## 2014-02-24 MED ORDER — METOPROLOL TARTRATE 50 MG PO TABS: 75.0000 mg | ORAL_TABLET | Freq: Two times a day (BID) | ORAL | Status: DC

## 2014-02-24 MED ORDER — APIXABAN 5 MG PO TABS: 5.0000 mg | ORAL_TABLET | Freq: Two times a day (BID) | ORAL | Status: DC

## 2014-02-24 MED ORDER — AMIODARONE HCL 400 MG PO TABS: 400.0000 mg | ORAL_TABLET | Freq: Two times a day (BID) | ORAL | Status: DC

## 2014-02-24 NOTE — Progress Notes (Signed)
Patient Profile: 63 y.o. male with a history of CAD s/p remote stenting and recent CABG x3 on 12/30/2013, PVD, HTN, hx of GI bleed, aortic root dilatation, obesity, hiatal hernia and sleep apnea who presented to [facility] today with chest pain and exertional dyspnea  Subjective: No complaints. Denies any chest pain, dyspnea or palpitations.   Objective: Vital signs in last 24 hours: Temp:  [97.5 F (36.4 C)-98.2 F (36.8 C)] 97.8 F (36.6 C) (08/25 0600) Pulse Rate:  [60-72] 60 (08/25 0600) Resp:  [16] 16 (08/25 0600) BP: (118-150)/(67-84) 121/71 mmHg (08/25 0600) SpO2:  [96 %-99 %] 97 % (08/25 0600) Weight:  [226 lb (102.513 kg)] 226 lb (102.513 kg) (08/25 0600) Last BM Date: 02/20/14 (laxitive given see mar)  Intake/Output from previous day: 08/24 0701 - 08/25 0700 In: 1080 [P.O.:1080] Out: 1700 [Urine:1700] Intake/Output this shift:    Medications Current Facility-Administered Medications  Medication Dose Route Frequency Provider Last Rate Last Dose  . 0.9 %  sodium chloride infusion  250 mL Intravenous PRN Eileen Stanford, PA-C      . acetaminophen (TYLENOL) tablet 650 mg  650 mg Oral Q4H PRN Eileen Stanford, PA-C   650 mg at 02/22/14 1803  . amiodarone (PACERONE) tablet 400 mg  400 mg Oral BID Cecilie Kicks, NP   400 mg at 02/23/14 2047  . antiseptic oral rinse (CPC / CETYLPYRIDINIUM CHLORIDE 0.05%) solution 7 mL  7 mL Mouth Rinse q12n4p Thompson Grayer, MD      . apixaban (ELIQUIS) tablet 5 mg  5 mg Oral BID Tarri Fuller, PA-C   5 mg at 02/23/14 2047  . aspirin chewable tablet 81 mg  81 mg Oral Daily Thompson Grayer, MD   81 mg at 02/23/14 1024  . atorvastatin (LIPITOR) tablet 20 mg  20 mg Oral Daily Eileen Stanford, PA-C   20 mg at 02/23/14 2047  . bisacodyl (DULCOLAX) EC tablet 5 mg  5 mg Oral QHS Cecilie Kicks, NP   5 mg at 02/23/14 2046  . cholecalciferol (VITAMIN D) tablet 1,000 Units  1,000 Units Oral Daily Eileen Stanford, PA-C   1,000 Units at 02/23/14 1025  .  cyclobenzaprine (FLEXERIL) tablet 10 mg  10 mg Oral TID PRN Eileen Stanford, PA-C      . metoprolol tartrate (LOPRESSOR) tablet 75 mg  75 mg Oral BID Josue Hector, MD   75 mg at 02/23/14 2047  . nitroGLYCERIN (NITROSTAT) SL tablet 0.4 mg  0.4 mg Sublingual Q5 Min x 3 PRN Eileen Stanford, PA-C      . omega-3 acid ethyl esters (LOVAZA) capsule 1 g  1 g Oral Once per day on Mon Thu Thompson Grayer, MD   1 g at 02/23/14 1027  . ondansetron (ZOFRAN) injection 4 mg  4 mg Intravenous Q6H PRN Eileen Stanford, PA-C      . sodium chloride 0.9 % injection 3 mL  3 mL Intravenous Q12H Eileen Stanford, PA-C   3 mL at 02/23/14 1032  . sodium chloride 0.9 % injection 3 mL  3 mL Intravenous PRN Eileen Stanford, PA-C   3 mL at 02/21/14 2140    PE: General appearance: alert, cooperative and no distress Neck: no JVD Lungs: clear to auscultation bilaterally Heart: regular rate and rhythm Extremities: no LEE Pulses: 2+ and symmetric Skin: warm and dry Neurologic: Grossly normal  Lab Results:   Recent Labs  02/21/14 1148 02/22/14 0630  WBC 10.1 8.5  HGB 15.1  14.1  HCT 44.9 42.6  PLT 236 224   BMET  Recent Labs  02/21/14 1148 02/22/14 0630 02/23/14 0355  NA 139 139 138  K 4.5 3.7 3.5*  CL 100 99 98  CO2 24 27 27   GLUCOSE 116* 97 103*  BUN 12 14 16   CREATININE 0.68 0.76 0.91  CALCIUM 9.4 9.1 9.0   PT/INR  Recent Labs  02/21/14 1148 02/22/14 0630  LABPROT 15.4* 16.4*  INR 1.22 1.32   Cholesterol  Recent Labs  02/22/14 0630  CHOL 115     Assessment/Plan  Principal Problem:   Shortness of breath Active Problems:   HYPERLIPIDEMIA-MIXED   CAD, NATIVE VESSEL   Morbid obesity, weight 283, BMI - 42.5.   History of laparoscopic adjustable gastric banding, AP standard, 09/09/2012.   S/P CABG x 3   PAF (paroxysmal atrial fibrillation)   Shoulder pain, right- rt,   1. Right shoulder and chest pain-  very atypical and not reminiscent of prior cardiac chest pain.  Troponin negative x3. But symptoms completely resolved now. Poss related to PAF? Vs. HF   2. SOB- CHF  D dimer elevated but no evidence of PE on CTA. However, CT with evidence of volume overload: bilateral effusions, cardiomegaly, and pulmonary venous hypertension. Mild widening of thoracic aorta and patchy consolidation in the lung bases and a small pericardial effusion. BNP slightly elevated at 278 on admit - pt on vacation with salty foods/seafood.  -- He received 2 doses of IV Lasix. -- 2D ECHO: EF unchanged at 50-55%; wall motion normal -- Net fluids: since admit -5368L.  --Euvolumeic on physical exam and no further dyspnea. --  3. PAF: New Dx. CHADSVASc= 3. Maintaining NSR with HR in the mid 60s. Continue PO Amiodarone at 400 mg BID as well as 75 mg of Lopressor BID. Continue Eliquis for stroke prophylaxis.   4. CAD: s/p CABG 12/30/13. Continue  ASA, statin and BB.   5. HTN- BP well controlled. On metoprolol 75 mg BID   6. OSA- CPAP   7. HLD:  Continue Statin and lovaza   Dispo: Plan for discharge home today with f/u with Dr. Domenic Polite in [place].   LOS: 3 days    Brittainy M. Ladoris Gene 02/24/2014 7:58 AM  Patient seen, examined. Available data reviewed. Agree with findings, assessment, and plan as outlined by Brittainy SImmons, PA-C. Independently interviewed and examined the patient. Echo reviewed as below: Study Conclusions  - Left ventricle: The cavity size was mildly dilated. Systolic function was normal. The estimated ejection fraction was in the range of 50% to 55%. Wall motion was normal; there were no regional wall motion abnormalities. - Aortic valve: Moderate thickening and calcification, consistent with sclerosis. There was trivial regurgitation. - Aorta: Aortic root dimension: 38 mm (ED). - Ascending aorta: The ascending aorta was mildly dilated. - Mitral valve: Calcified annulus. - Left atrium: The atrium was mildly dilated. - Tricuspid valve: There was  mild regurgitation. - Pulmonic valve: There was mild regurgitation. - Pericardium, extracardiac: A trivial pericardial effusion was identified posterior to the heart.  The patient is doing well, maintaining sinus rhythm. OK for discharge home this am to follow-up with Dr Domenic Polite. Would treat with Eliquis and low-dose ASA. He has been instructed to stop plavix. Will treat with amiodarone for the short-term. Hopefully his AF will limited to the post-operative period (he is still within 2 months of CABG).  Sherren Mocha, M.D. 02/24/2014 10:49 AM

## 2014-02-24 NOTE — Discharge Summary (Signed)
Physician Discharge Summary  Patient ID: [name] MRN: [id number] DOB/AGE: February 18, 1951 63 y.o.  Admit date: 02/21/2014 Discharge date: 02/24/2014  Admission Diagnoses: Shortness of breath  Discharge Diagnoses:  Principal Problem:   Shortness of breath Active Problems:   HYPERLIPIDEMIA-MIXED   CAD, NATIVE VESSEL   Morbid obesity, weight 283, BMI - 42.5.   History of laparoscopic adjustable gastric banding, AP standard, 09/09/2012.   S/P CABG x 3   PAF (paroxysmal atrial fibrillation)   Shoulder pain, right- rt,    Discharged Condition: stable  Hospital Course: [name] is a 63 y.o. male with a history of CAD s/p remote stenting and recent CABG x3 on 12/30/2013, PVD, HTN, hx of GI bleed, aortic root dilatation, obesity, hiatal hernia and sleep apnea who presented to [facility]  On 02/21/14 with chest pain and exertional dyspnea.   He reported he was on a recent car trip to [facility] and on the way home he noticed right shoulder pain that slowly moved to his central chest and into his left shoulder. The pain was is not reminiscent of his previous cardiac chest pain which was a central chest tightness. He admited to recently carrying heavy luggage and groceries which he was advised not to do after his open heart surgery. The pain felt as if it would be relieved if he could "pop" his shoulder back into place. He also noted mild DOE, but no orthopnea or PND. While on vacation, he mentioned that he ate a lot of salty sea food and he had previously  been carefully watching his salt intake.   He was admitted for w/u. Cardiac enzymes were negative x 3.  A 2D echo was obtained revealing normal systolic function with an EF of 55-60% and no WMAs. His chest pain was atypical and felt not to be cardiac in nature. ProBNP was slightly elevated at 278.1. This was felt to be related to dietary indiscretion. He was treated with 2 doses of IV Lasix and noted improvement in symptoms and dyspnea  resolved.   During his admission, he was noted to have several runs of atrial flutter on telemetry. This is a new diagnosis. CHADVASC score was calculated at 3. It was decided to increase his Lopressor to 75 mg BID and to add amiodarone. He was placed on 400 mg BID. He was also placed on Eliquis for A/C. His Plavix was discontinued. He was continued on low dose ASA. He was further monitored and remained in NSR. His HR remained stable. He had no further issues. He was last seen and examined by Dr. Burt Knack, who determined he was stable for discharge home. He is scheduled for f/u with Dr. Domenic Polite in [place] on 03/04/14.     Consults: None  Significant Diagnostic Studies:   2D echo 02/23/14 Study Conclusions  - Left ventricle: The cavity size was mildly dilated. Systolic function was normal. The estimated ejection fraction was in the range of 50% to 55%. Wall motion was normal; there were no regional wall motion abnormalities. - Aortic valve: Moderate thickening and calcification, consistent with sclerosis. There was trivial regurgitation. - Aorta: Aortic root dimension: 38 mm (ED). - Ascending aorta: The ascending aorta was mildly dilated. - Mitral valve: Calcified annulus. - Left atrium: The atrium was mildly dilated. - Tricuspid valve: There was mild regurgitation. - Pulmonic valve: There was mild regurgitation. - Pericardium, extracardiac: A trivial pericardial effusion was identified posterior to the heart.   Treatments: See Hospital Course  Discharge Exam:  Blood pressure 121/71, pulse 60, temperature 97.8 F (36.6 C), temperature source Oral, resp. rate 16, height 5\' 8"  (1.727 m), weight 226 lb (102.513 kg), SpO2 97.00%.   Disposition: 01-Home or Self Care     Medication List    STOP taking these medications       clopidogrel 75 MG tablet  Commonly known as:  PLAVIX      TAKE these medications       amiodarone 400 MG tablet  Commonly known as:  PACERONE  Take 1 tablet  (400 mg total) by mouth 2 (two) times daily.     apixaban 5 MG Tabs tablet  Commonly known as:  ELIQUIS  Take 1 tablet (5 mg total) by mouth 2 (two) times daily.     aspirin 81 MG tablet  Take 1 tablet (81 mg total) by mouth daily.     atorvastatin 20 MG tablet  Commonly known as:  LIPITOR  Take 1 tablet (20 mg total) by mouth daily.     cholecalciferol 1000 UNITS tablet  Commonly known as:  VITAMIN D  Take 1,000 Units by mouth daily.     Co Q-10 200 MG Caps  Take 1 capsule by mouth daily.     cyclobenzaprine 10 MG tablet  Commonly known as:  FLEXERIL  Take 10 mg by mouth 3 (three) times daily as needed for muscle spasms.     fish oil-omega-3 fatty acids 1000 MG capsule  Take 1 g by mouth 2 (two) times a week.     metoprolol 50 MG tablet  Commonly known as:  LOPRESSOR  Take 1.5 tablets (75 mg total) by mouth 2 (two) times daily.     POMEGRANATE PO  Take 435 mg by mouth daily.     tadalafil 20 MG tablet  Commonly known as:  CIALIS  Take 20 mg by mouth daily as needed for erectile dysfunction.     TURMERIC PO  Take 600 mg by mouth 2 (two) times daily.       Follow-up Information   Follow up with Rozann Lesches, MD On 03/04/2014. (3:20 pm )    Specialty:  Cardiology   Contact information:   [address] [place] [address] [place], INCLUDING PHYSICIAN TIME: >30 MINUTES  Signed: Lyda Jester 02/24/2014, 5:18 PM

## 2014-02-24 NOTE — Progress Notes (Signed)
Discharge education completed by RN. Pt received a copy of discharge paperwork and confirms understanding of follow up appointments and discharge medications. Pt denies questions at this time. IV removed, site is within normal limits. Pt will discharge from the unit via wheelchair.

## 2014-02-25 ENCOUNTER — Encounter (HOSPITAL_COMMUNITY): Payer: BC Managed Care – PPO

## 2014-02-27 ENCOUNTER — Encounter (HOSPITAL_COMMUNITY): Payer: BC Managed Care – PPO

## 2014-03-02 ENCOUNTER — Encounter (HOSPITAL_COMMUNITY)
Admission: RE | Admit: 2014-03-02 | Discharge: 2014-03-02 | Disposition: A | Discharge status: Home / Self Care | Payer: BC Managed Care – PPO | Source: Ambulatory Visit | Attending: Cardiology | Admitting: Cardiology

## 2014-03-02 DIAGNOSIS — Z5189 Encounter for other specified aftercare: Secondary | ICD-10-CM | POA: Diagnosis present

## 2014-03-02 DIAGNOSIS — I2510 Atherosclerotic heart disease of native coronary artery without angina pectoris: Secondary | ICD-10-CM | POA: Diagnosis not present

## 2014-03-02 DIAGNOSIS — Z951 Presence of aortocoronary bypass graft: Secondary | ICD-10-CM | POA: Diagnosis not present

## 2014-03-04 ENCOUNTER — Encounter: Payer: Self-pay | Admitting: Cardiology

## 2014-03-04 ENCOUNTER — Encounter (HOSPITAL_COMMUNITY)
Admission: RE | Admit: 2014-03-04 | Discharge: 2014-03-04 | Disposition: A | Discharge status: Home / Self Care | Payer: BC Managed Care – PPO | Source: Ambulatory Visit | Attending: Cardiology | Admitting: Cardiology

## 2014-03-04 ENCOUNTER — Ambulatory Visit (INDEPENDENT_AMBULATORY_CARE_PROVIDER_SITE_OTHER): Payer: BC Managed Care – PPO | Admitting: Cardiology

## 2014-03-04 VITALS — BP 117/68 | HR 59 | Ht 68.0 in | Wt 231.0 lb

## 2014-03-04 DIAGNOSIS — I4892 Unspecified atrial flutter: Secondary | ICD-10-CM

## 2014-03-04 DIAGNOSIS — I10 Essential (primary) hypertension: Secondary | ICD-10-CM

## 2014-03-04 DIAGNOSIS — Z951 Presence of aortocoronary bypass graft: Secondary | ICD-10-CM | POA: Diagnosis not present

## 2014-03-04 DIAGNOSIS — I2510 Atherosclerotic heart disease of native coronary artery without angina pectoris: Secondary | ICD-10-CM | POA: Insufficient documentation

## 2014-03-04 DIAGNOSIS — E785 Hyperlipidemia, unspecified: Secondary | ICD-10-CM

## 2014-03-04 DIAGNOSIS — Z5189 Encounter for other specified aftercare: Secondary | ICD-10-CM | POA: Insufficient documentation

## 2014-03-04 MED ORDER — AMIODARONE HCL 200 MG PO TABS: 200.0000 mg | ORAL_TABLET | Freq: Every day | ORAL | Status: DC

## 2014-03-04 NOTE — Patient Instructions (Signed)
Your physician recommends that you schedule a follow-up appointment in: 1 month. Your physician has recommended you make the following change in your medication:  STOP ASPIRIN. Decrease amiodarone to 200 mg daily. You may break your 400 mg tablets in half daily until they are finished. Continue all other medications the same.

## 2014-03-04 NOTE — Assessment & Plan Note (Signed)
Continue Eliquis, stop aspirin, reduce amiodarone to 200 mg once daily. Office followup in one month.

## 2014-03-04 NOTE — ED Provider Notes (Signed)
Medical screening examination/treatment/procedure(s) were performed by non-physician practitioner and as supervising physician I was immediately available for consultation/collaboration.    Dot Lanes, MD 03/04/14 2214

## 2014-03-04 NOTE — Assessment & Plan Note (Signed)
Blood pressure control is good today. No changes made. 

## 2014-03-04 NOTE — Assessment & Plan Note (Signed)
Doing relatively well status post CABG in June. He is not interested formal cardiac rehabilitation, plans to exercise on his own. Recent followup echocardiogram showed LVEF 50-55%.

## 2014-03-04 NOTE — Progress Notes (Signed)
Clinical Summary [name] is a 63 y.o.male last seen in July of this year in followup of CABG. Record review finds interval hospitalization in late August secondary to shortness of breath. He had been traveling prior to this in [place], also carrying heavy luggage and groceries, salt intake was also increased. He ruled out for myocardial infarction and echocardiogram showed stable LVEF. He was given IV Lasix for mild component of volume overload. He was also noted to have transient episodes of atrial flutter by telemetry, was started on Eliquis and also placed on amiodarone.  He states he feels much better. Breathing is at baseline. He walks up to 5 miles at a time. It sounds like he may have been taking amiodarone at 800 mg twice daily instead of 400 mg twice daily. Otherwise tolerating Eliquis.  Echocardiogram from August showed mildly dilated LV with LVEF 50-55%, sclerotic aortic valve with trivial aortic regurgitation, aortic root 38 mm in diastole, mild left atrial enlargement, mild tricuspid regurgitation and pulmonic regurgitation, trivial pericardial effusion posteriorly.  He has had documentation of normal TSH and LFTs. ECG from August 24 showed sinus rhythm with left anterior fascicular block and nonspecific ST-T changes.  Allergies  Allergen Reactions  . Chlorhexidine Itching    Pt. States on last admission CHG wipes made him itch and it had to be washed off.  Uncertain if benadryl was given.    Current Outpatient Prescriptions  Medication Sig Dispense Refill  . apixaban (ELIQUIS) 5 MG TABS tablet Take 1 tablet (5 mg total) by mouth 2 (two) times daily.  60 tablet  11  . atorvastatin (LIPITOR) 20 MG tablet Take 1 tablet (20 mg total) by mouth daily.  30 tablet  6  . cholecalciferol (VITAMIN D) 1000 UNITS tablet Take 1,000 Units by mouth daily.      . Coenzyme Q10 (CO Q-10) 200 MG CAPS Take 1 capsule by mouth daily.      . cyclobenzaprine (FLEXERIL) 10 MG tablet Take 10  mg by mouth 3 (three) times daily as needed for muscle spasms.      . fish oil-omega-3 fatty acids 1000 MG capsule Take 1 g by mouth 2 (two) times a week.       . metoprolol tartrate (LOPRESSOR) 50 MG tablet Take 1.5 tablets (75 mg total) by mouth 2 (two) times daily.  90 tablet  5  . Pomegranate, Punica granatum, (POMEGRANATE PO) Take 435 mg by mouth daily.      . tadalafil (CIALIS) 20 MG tablet Take 20 mg by mouth daily as needed for erectile dysfunction.       . TURMERIC PO Take 600 mg by mouth 2 (two) times daily.      Marland Kitchen amiodarone (PACERONE) 200 MG tablet Take 1 tablet (200 mg total) by mouth daily.  90 tablet  3   No current facility-administered medications for this visit.    Past Medical History  Diagnosis Date  . Coronary atherosclerosis of native coronary artery     Multiple stents - RCA/LAD/diagonal, overlapping DES LAD, stent thrombosis 2005, ultimately CABG  . Essential hypertension, benign   . Peripheral vascular disease   . Myocardial infarction     Anterior with LAD stent thrombosis 2005  . Arthritis   . Hyperlipemia   . History of GI bleed     On nonsteroidals  . Aortic root dilatation     41 mm by CT 2008, 36 mm by echo 2011  . Obesity   .  H/O hiatal hernia   . Sleep apnea     CPAP daily    Past Surgical History  Procedure Laterality Date  . Appendectomy    . Cholecystectomy    . Total knee arthroplasty Right 2006  . Total knee arthroplasty Left 2007  . Elbow arthroscopy Right   . Shoulder arthroscopy Right 4/11  . Cervical fusion  9/10  . Coronary angioplasty with stent placement  4098,1191  . Carpal tunnel release Right 6/12  . Carpal tunnel release  06/06/2011    Procedure: CARPAL TUNNEL RELEASE;  Surgeon: Cammie Sickle., MD;  Location: [place];  Service: Orthopedics;  Laterality: Right;  . Ulnar nerve transposition  06/06/2011    Procedure: ULNAR NERVE DECOMPRESSION/TRANSPOSITION;  Surgeon: Cammie Sickle., MD;  Location:  [place];  Service: Orthopedics;  Laterality: Right;  decompression ulnar nerve right cubital tunnel   . Breath tek h pylori  06/11/2012    Procedure: BREATH TEK H PYLORI;  Surgeon: Shann Medal, MD;  Location: [facility];  Service: General;  Laterality: N/A;  Seth Bake  . Laparoscopic gastric banding  3/14  . Carpal tunnel release Right 04/10/2013    Procedure: REVISION OF CARPAL TUNNEL RELEASE LIGAMENT RIGHT WRIST;  Surgeon: Cammie Sickle., MD;  Location: [facility];  Service: Orthopedics;  Laterality: Right;  . Coronary artery bypass graft N/A 12/30/2013    Procedure: CORONARY ARTERY BYPASS GRAFTING (CABG) x three, using left internal mammary artery and right leg greater sapheneous vein harvested endoscopically - LIMA to LAD, SVG-D, SVG-OM1;  Surgeon: Ivin Poot, MD;  Location: [facility];  Service: Open Heart Surgery;  Laterality: N/A;  . Cardiac surgery      Social History [name] reports that he has never smoked. He quit smokeless tobacco use about 10 years ago. His smokeless tobacco use included Chew. [name] reports that he drinks about .6 ounces of alcohol per week.  Review of Systems Other systems reviewed and negative.  Physical Examination Filed Vitals:   03/04/14 1520  BP: 117/68  Pulse: 59   Filed Weights   03/04/14 1520  Weight: 231 lb (104.781 kg)    Obese male, appears comfortable at rest.  HEENT: Conjunctiva and lids normal, oropharynx clear.  Neck: Supple, no elevated JVP or carotid bruits, no thyromegaly.  Lungs: Clear to auscultation, nonlabored breathing at rest.  Thorax: Well-healed sternal incision, no erythema or drainage.  Cardiac: Regular rate and rhythm, no S3 or significant systolic murmur, no pericardial rub.  Abdomen: Soft, obese, nontender, bowel sounds present.  Extremities: Trace edema, distal pulses 2+.  Skin: Warm and dry.  Musculoskeletal: No kyphosis.  Neuropsychiatric: Alert and oriented x3,  affect grossly appropriate.   Problem List and Plan   Paroxysmal atrial flutter Continue Eliquis, stop aspirin, reduce amiodarone to 200 mg once daily. Office followup in one month.  CAD, NATIVE VESSEL Doing relatively well status post CABG in June. He is not interested formal cardiac rehabilitation, plans to exercise on his own. Recent followup echocardiogram showed LVEF 50-55%.  HYPERLIPIDEMIA-MIXED Continues on Lipitor.  Essential hypertension, benign Blood pressure control is good today. No changes made.    Satira Sark, M.D., F.A.C.C.

## 2014-03-04 NOTE — Assessment & Plan Note (Signed)
Continues on Lipitor.

## 2014-03-06 ENCOUNTER — Encounter (HOSPITAL_COMMUNITY): Payer: BC Managed Care – PPO

## 2014-03-09 ENCOUNTER — Encounter (HOSPITAL_COMMUNITY): Payer: BC Managed Care – PPO

## 2014-03-11 ENCOUNTER — Ambulatory Visit (INDEPENDENT_AMBULATORY_CARE_PROVIDER_SITE_OTHER): Payer: Self-pay | Admitting: Cardiothoracic Surgery

## 2014-03-11 ENCOUNTER — Encounter (HOSPITAL_COMMUNITY): Payer: BC Managed Care – PPO

## 2014-03-11 ENCOUNTER — Encounter: Payer: Self-pay | Admitting: Cardiothoracic Surgery

## 2014-03-11 VITALS — BP 135/78 | HR 48 | Resp 20 | Ht 68.0 in | Wt 231.0 lb

## 2014-03-11 DIAGNOSIS — I2510 Atherosclerotic heart disease of native coronary artery without angina pectoris: Secondary | ICD-10-CM

## 2014-03-11 DIAGNOSIS — Z951 Presence of aortocoronary bypass graft: Secondary | ICD-10-CM

## 2014-03-11 NOTE — Progress Notes (Signed)
PCP is Rory Percy, MD Referring Provider is Arnoldo Lenis, MD  Chief Complaint  Patient presents with  . Routine Post Op    4 week f/u, re check right leg    HPI: 2 month followup after CABG Patient developed atrial fibrillation and fluid retention after returning from a trip to [place] drop his son at college and was hospitalized. He was treated with diuretics and amiodarone. Cardiac enzymes are negative. Echocardiogram short normal LV function. Chest x-ray showed some mild pleural effusions. He responded to medical therapy. He was placed on Eliquis as well   Past Medical History  Diagnosis Date  . Coronary atherosclerosis of native coronary artery     Multiple stents - RCA/LAD/diagonal, overlapping DES LAD, stent thrombosis 2005, ultimately CABG  . Essential hypertension, benign   . Peripheral vascular disease   . Myocardial infarction     Anterior with LAD stent thrombosis 2005  . Arthritis   . Hyperlipemia   . History of GI bleed     On nonsteroidals  . Aortic root dilatation     41 mm by CT 2008, 36 mm by echo 2011  . Obesity   . H/O hiatal hernia   . Sleep apnea     CPAP daily    Past Surgical History  Procedure Laterality Date  . Appendectomy    . Cholecystectomy    . Total knee arthroplasty Right 2006  . Total knee arthroplasty Left 2007  . Elbow arthroscopy Right   . Shoulder arthroscopy Right 4/11  . Cervical fusion  9/10  . Coronary angioplasty with stent placement  5852,[id number]  . Carpal tunnel release Right 6/12  . Carpal tunnel release  06/06/2011    Procedure: CARPAL TUNNEL RELEASE;  Surgeon: Cammie Sickle., MD;  Location: [place];  Service: Orthopedics;  Laterality: Right;  . Ulnar nerve transposition  06/06/2011    Procedure: ULNAR NERVE DECOMPRESSION/TRANSPOSITION;  Surgeon: Cammie Sickle., MD;  Location: [place];  Service: Orthopedics;  Laterality: Right;  decompression ulnar nerve right cubital tunnel    . Breath tek h pylori  06/11/2012    Procedure: BREATH TEK H PYLORI;  Surgeon: Shann Medal, MD;  Location: [facility];  Service: General;  Laterality: N/A;  Seth Bake  . Laparoscopic gastric banding  3/14  . Carpal tunnel release Right 04/10/2013    Procedure: REVISION OF CARPAL TUNNEL RELEASE LIGAMENT RIGHT WRIST;  Surgeon: Cammie Sickle., MD;  Location: [place];  Service: Orthopedics;  Laterality: Right;  . Coronary artery bypass graft N/A 12/30/2013    Procedure: CORONARY ARTERY BYPASS GRAFTING (CABG) x three, using left internal mammary artery and right leg greater sapheneous vein harvested endoscopically - LIMA to LAD, SVG-D, SVG-OM1;  Surgeon: Ivin Poot, MD;  Location: [place];  Service: Open Heart Surgery;  Laterality: N/A;  . Cardiac surgery      Family History  Problem Relation Age of Onset  . Colon cancer Father   . Colon cancer Maternal Grandmother   . Obesity Mother   . Diabetes Maternal Grandfather     Social History History  Substance Use Topics  . Smoking status: Never Smoker   . Smokeless tobacco: Former Systems developer    Types: Chew    Quit date: 07/03/2003  . Alcohol Use: 0.6 oz/week    1 Cans of beer per week     Comment:  sometimes 4 to 5 beers a day - but  not every day    Current Outpatient Prescriptions  Medication Sig Dispense Refill  . amiodarone (PACERONE) 200 MG tablet Take 1 tablet (200 mg total) by mouth daily.  90 tablet  3  . apixaban (ELIQUIS) 5 MG TABS tablet Take 1 tablet (5 mg total) by mouth 2 (two) times daily.  60 tablet  11  . atorvastatin (LIPITOR) 20 MG tablet Take 1 tablet (20 mg total) by mouth daily.  30 tablet  6  . cholecalciferol (VITAMIN D) 1000 UNITS tablet Take 1,000 Units by mouth daily.      . Coenzyme Q10 (CO Q-10) 200 MG CAPS Take 1 capsule by mouth daily.      . cyclobenzaprine (FLEXERIL) 10 MG tablet Take 10 mg by mouth 3 (three) times daily as needed for muscle spasms.      . fish oil-omega-3 fatty acids  1000 MG capsule Take 1 g by mouth 2 (two) times a week.       . metoprolol tartrate (LOPRESSOR) 50 MG tablet Take 1.5 tablets (75 mg total) by mouth 2 (two) times daily.  90 tablet  5  . Pomegranate, Punica granatum, (POMEGRANATE PO) Take 435 mg by mouth daily.      . tadalafil (CIALIS) 20 MG tablet Take 20 mg by mouth daily as needed for erectile dysfunction.       . TURMERIC PO Take 600 mg by mouth 2 (two) times daily.       No current facility-administered medications for this visit.    Allergies  Allergen Reactions  . Chlorhexidine Itching    Pt. States on last admission CHG wipes made him itch and it had to be washed off.  Uncertain if benadryl was given.    Review of Systems no fever   now feels well  BP 135/78  Pulse 48  Resp 20  Ht 5\' 8"  (1.727 m)  Wt 231 lb (104.781 kg)  BMI 35.13 kg/m2  SpO2 98% Physical Exam Alert and comfortable Lungs clear Heart rate regular No edema Incision is well-healed  Diagnostic Tests: X-ray CTA and echo from recent hospitalization reviewed Sternal wires all intact and the sternum healing well.  Impression: Doing well 2 months postop Continue rehabilitation activities Continue weight restriction no lifting more than 20 pounds until 3 months after surgery  Plan return as needed:

## 2014-03-13 ENCOUNTER — Encounter (HOSPITAL_COMMUNITY): Payer: BC Managed Care – PPO

## 2014-03-16 ENCOUNTER — Encounter (HOSPITAL_COMMUNITY): Payer: BC Managed Care – PPO

## 2014-03-18 ENCOUNTER — Encounter (HOSPITAL_COMMUNITY): Payer: BC Managed Care – PPO

## 2014-03-20 ENCOUNTER — Encounter (HOSPITAL_COMMUNITY): Payer: BC Managed Care – PPO

## 2014-03-23 ENCOUNTER — Encounter (HOSPITAL_COMMUNITY): Payer: BC Managed Care – PPO

## 2014-03-25 ENCOUNTER — Telehealth: Payer: Self-pay | Admitting: *Deleted

## 2014-03-25 ENCOUNTER — Encounter (HOSPITAL_COMMUNITY): Payer: BC Managed Care – PPO

## 2014-03-25 DIAGNOSIS — I10 Essential (primary) hypertension: Secondary | ICD-10-CM

## 2014-03-25 MED ORDER — FUROSEMIDE 20 MG PO TABS: 40.0000 mg | ORAL_TABLET | Freq: Every day | ORAL | Status: DC

## 2014-03-25 NOTE — Telephone Encounter (Signed)
Patient informed and verbalize understanding of plan. Lab order faxed to [facility] lab.

## 2014-03-25 NOTE — Telephone Encounter (Signed)
Noted. Patient is not on a diuretic at this time, did improve with IV diuresis during his last hospital stay. Let's put him on Lasix 40 mg daily for the next 3 or 4 days, if he starts to feel better, would reduce to Lasix 20 mg daily. Check BMET in 2 weeks

## 2014-03-25 NOTE — Telephone Encounter (Signed)
C/O of stitch or sharp pain feeling on left rib cage that started on yesterday. Patient said its more intense today than it was on yesterday. Patient said when he inhales to his full capacity, he feels the sharp pain in his left rib cage. No c/o sob, chest pain, or dizziness. No c/o swelling anywhere. Patient didn't have any problems sleeping last night. Patient said the is leaving to go out of town on Saturday and is concern because this is the same feeling he had back in August when he was hospitalized with fluid on his lungs and he went into a-fib. Patient informed that he would be called back with MD's response.

## 2014-03-27 ENCOUNTER — Encounter (HOSPITAL_COMMUNITY): Payer: BC Managed Care – PPO

## 2014-03-30 ENCOUNTER — Encounter (HOSPITAL_COMMUNITY): Payer: BC Managed Care – PPO

## 2014-04-01 ENCOUNTER — Encounter (HOSPITAL_COMMUNITY): Payer: BC Managed Care – PPO

## 2014-04-03 ENCOUNTER — Encounter (HOSPITAL_COMMUNITY): Payer: BC Managed Care – PPO

## 2014-04-06 ENCOUNTER — Encounter (HOSPITAL_COMMUNITY): Payer: BC Managed Care – PPO

## 2014-04-08 ENCOUNTER — Encounter (HOSPITAL_COMMUNITY): Payer: BC Managed Care – PPO

## 2014-04-10 ENCOUNTER — Encounter (HOSPITAL_COMMUNITY): Payer: BC Managed Care – PPO

## 2014-04-13 ENCOUNTER — Encounter (HOSPITAL_COMMUNITY): Payer: BC Managed Care – PPO

## 2014-04-15 ENCOUNTER — Encounter (HOSPITAL_COMMUNITY): Payer: BC Managed Care – PPO

## 2014-04-15 ENCOUNTER — Ambulatory Visit (INDEPENDENT_AMBULATORY_CARE_PROVIDER_SITE_OTHER): Payer: BC Managed Care – PPO | Admitting: Cardiology

## 2014-04-15 ENCOUNTER — Encounter: Payer: Self-pay | Admitting: Cardiology

## 2014-04-15 VITALS — BP 129/71 | HR 52 | Ht 68.0 in | Wt 237.2 lb

## 2014-04-15 DIAGNOSIS — I2510 Atherosclerotic heart disease of native coronary artery without angina pectoris: Secondary | ICD-10-CM

## 2014-04-15 DIAGNOSIS — N529 Male erectile dysfunction, unspecified: Secondary | ICD-10-CM

## 2014-04-15 DIAGNOSIS — I4892 Unspecified atrial flutter: Secondary | ICD-10-CM

## 2014-04-15 DIAGNOSIS — E782 Mixed hyperlipidemia: Secondary | ICD-10-CM

## 2014-04-15 DIAGNOSIS — I10 Essential (primary) hypertension: Secondary | ICD-10-CM

## 2014-04-15 DIAGNOSIS — Z5181 Encounter for therapeutic drug level monitoring: Secondary | ICD-10-CM

## 2014-04-15 NOTE — Patient Instructions (Addendum)
Your physician recommends that you schedule a follow-up appointment in: 3 months. You will receive a reminder letter in the mail in about 1-2 months reminding you to call and schedule your appointment. If you don't receive this letter, please contact our office. Your physician recommends that you continue on your current medications as directed. Please refer to the Current Medication list given to you today. Your physician recommends that you have lab work in 3 months just before your next visit in January 2016.

## 2014-04-15 NOTE — Assessment & Plan Note (Signed)
Blood pressure is reasonably well controlled today.

## 2014-04-15 NOTE — Assessment & Plan Note (Signed)
On Cialis per Dr. Nadara Mustard. No specific cardiac contraindication for him to have this medication refilled. I told him that I would forward this note to Dr. Nadara Mustard.

## 2014-04-15 NOTE — Assessment & Plan Note (Signed)
Continues on Lipitor and co-Q10.

## 2014-04-15 NOTE — Assessment & Plan Note (Signed)
Multivessel status post CABG in June of this year. He is symptomatically stable at this time. Continue observation.

## 2014-04-15 NOTE — Assessment & Plan Note (Signed)
Maintaining sinus rhythm. Continue Eliquis and amiodarone as well as beta blocker. Will likely reduce amiodarone to 100 mg daily ultimately. Followup CBC and BMET for his next visit in 3 months.

## 2014-04-15 NOTE — Progress Notes (Signed)
Clinical Summary Mr. Ludemann is a 63 y.o.male last seen in September. Since that time he states that he was diagnosed with pleurisy by Dr. Nadara Mustard, placed on steroids. Reports a chest x-ray that did not show any reaccumulating pleural effusion, and therefore did not start Lasix. He has been doing well, in fact traveled to [place] since saw him.  Last BMET in August showed potassium 3.5, BUN 16, creatinine 0.9.  Echocardiogram from August showed mildly dilated LV with LVEF 50-55%, sclerotic aortic valve with trivial aortic regurgitation, aortic root 38 mm in diastole, mild left atrial enlargement, mild tricuspid regurgitation and pulmonic regurgitation, trivial pericardial effusion posteriorly.  Reports no palpitations, tolerating amiodarone and Eliquis.  He asked if it was okay if Dr. Nadara Mustard refilled his Cialis. He is not using any nitrates.   Allergies  Allergen Reactions  . Chlorhexidine Itching    Pt. States on last admission CHG wipes made him itch and it had to be washed off.  Uncertain if benadryl was given.    Current Outpatient Prescriptions  Medication Sig Dispense Refill  . amiodarone (PACERONE) 200 MG tablet Take 1 tablet (200 mg total) by mouth daily.  90 tablet  3  . apixaban (ELIQUIS) 5 MG TABS tablet Take 1 tablet (5 mg total) by mouth 2 (two) times daily.  60 tablet  11  . atorvastatin (LIPITOR) 20 MG tablet Take 1 tablet (20 mg total) by mouth daily.  30 tablet  6  . cholecalciferol (VITAMIN D) 1000 UNITS tablet Take 1,000 Units by mouth daily.      . Coenzyme Q10 (CO Q-10) 200 MG CAPS Take 1 capsule by mouth daily.      . cyclobenzaprine (FLEXERIL) 10 MG tablet Take 10 mg by mouth 3 (three) times daily as needed for muscle spasms.      . fish oil-omega-3 fatty acids 1000 MG capsule Take 1 g by mouth 2 (two) times a week.       . furosemide (LASIX) 20 MG tablet Take 2 tablets (40 mg total) by mouth daily. For 3-4 days, if symptoms improve reduce to 20 mg daily Lab work  in 2 weeks around 04/08/14  90 tablet  3  . metoprolol tartrate (LOPRESSOR) 50 MG tablet Take 1.5 tablets (75 mg total) by mouth 2 (two) times daily.  90 tablet  5  . naproxen (NAPROSYN) 500 MG tablet Take 500 mg by mouth as needed.      . Pomegranate, Punica granatum, (POMEGRANATE PO) Take 435 mg by mouth daily.      . tadalafil (CIALIS) 20 MG tablet Take 20 mg by mouth daily as needed for erectile dysfunction.       . TURMERIC PO Take 600 mg by mouth 2 (two) times daily.       No current facility-administered medications for this visit.    Past Medical History  Diagnosis Date  . Coronary atherosclerosis of native coronary artery     Multiple stents - RCA/LAD/diagonal, overlapping DES LAD, stent thrombosis 2005, ultimately CABG  . Essential hypertension, benign   . Peripheral vascular disease   . Myocardial infarction     Anterior with LAD stent thrombosis 2005  . Arthritis   . Hyperlipemia   . History of GI bleed     On nonsteroidals  . Aortic root dilatation     41 mm by CT 2008, 36 mm by echo 2011  . Obesity   . H/O hiatal hernia   . Sleep apnea  CPAP daily  . Paroxysmal atrial flutter     Past Surgical History  Procedure Laterality Date  . Appendectomy    . Cholecystectomy    . Total knee arthroplasty Right 2006  . Total knee arthroplasty Left 2007  . Elbow arthroscopy Right   . Shoulder arthroscopy Right 4/11  . Cervical fusion  9/10  . Coronary angioplasty with stent placement  [id number]  . Carpal tunnel release Right 6/12  . Carpal tunnel release  06/06/2011    Procedure: CARPAL TUNNEL RELEASE;  Surgeon: Cammie Sickle., MD;  Location: [place];  Service: Orthopedics;  Laterality: Right;  . Ulnar nerve transposition  06/06/2011    Procedure: ULNAR NERVE DECOMPRESSION/TRANSPOSITION;  Surgeon: Cammie Sickle., MD;  Location: [place];  Service: Orthopedics;  Laterality: Right;  decompression ulnar nerve right cubital tunnel    . Breath tek h pylori  06/11/2012    Procedure: BREATH TEK H PYLORI;  Surgeon: Shann Medal, MD;  Location: [facility];  Service: General;  Laterality: N/A;  Seth Bake  . Laparoscopic gastric banding  3/14  . Carpal tunnel release Right 04/10/2013    Procedure: REVISION OF CARPAL TUNNEL RELEASE LIGAMENT RIGHT WRIST;  Surgeon: Cammie Sickle., MD;  Location: [place];  Service: Orthopedics;  Laterality: Right;  . Coronary artery bypass graft N/A 12/30/2013    Procedure: CORONARY ARTERY BYPASS GRAFTING (CABG) x three, using left internal mammary artery and right leg greater sapheneous vein harvested endoscopically - LIMA to LAD, SVG-D, SVG-OM1;  Surgeon: Ivin Poot, MD;  Location: [place];  Service: Open Heart Surgery;  Laterality: N/A;  . Cardiac surgery      Social History Mr. Aytes reports that he has never smoked. He quit smokeless tobacco use about 10 years ago. His smokeless tobacco use included Chew. Mr. Coltrane reports that he drinks about .6 ounces of alcohol per week.  Review of Systems Other systems reviewed and negative except as outlined.  Physical Examination Filed Vitals:   04/15/14 1456  BP: 129/71  Pulse: 52   Filed Weights   04/15/14 1456  Weight: 237 lb 4 oz (107.616 kg)    Obese male, appears comfortable at rest.  HEENT: Conjunctiva and lids normal, oropharynx clear.  Neck: Supple, no elevated JVP or carotid bruits, no thyromegaly.  Lungs: Clear to auscultation, nonlabored breathing at rest.  Thorax: Well-healed sternal incision, no erythema or drainage.  Cardiac: Regular rate and rhythm, no S3 or significant systolic murmur, no pericardial rub.  Abdomen: Soft, obese, nontender, bowel sounds present.  Extremities: No edema, distal pulses 2+.  Skin: Warm and dry.  Musculoskeletal: No kyphosis.  Neuropsychiatric: Alert and oriented x3, affect grossly appropriate.   Problem List and Plan   CAD, NATIVE VESSEL Multivessel status  post CABG in June of this year. He is symptomatically stable at this time. Continue observation.  Paroxysmal atrial flutter Maintaining sinus rhythm. Continue Eliquis and amiodarone as well as beta blocker. Will likely reduce amiodarone to 100 mg daily ultimately. Followup CBC and BMET for his next visit in 3 months.  Mixed hyperlipidemia Continues on Lipitor and co-Q10.  Essential hypertension, benign Blood pressure is reasonably well controlled today.  Erectile dysfunction On Cialis per Dr. Nadara Mustard. No specific cardiac contraindication for him to have this medication refilled. I told him that I would forward this note to Dr. Nadara Mustard.    Satira Sark, M.D., F.A.C.C.

## 2014-04-16 ENCOUNTER — Encounter (INDEPENDENT_AMBULATORY_CARE_PROVIDER_SITE_OTHER): Payer: BC Managed Care – PPO

## 2014-04-17 ENCOUNTER — Encounter (HOSPITAL_COMMUNITY): Payer: BC Managed Care – PPO

## 2014-04-20 ENCOUNTER — Encounter (HOSPITAL_COMMUNITY): Payer: BC Managed Care – PPO

## 2014-04-22 ENCOUNTER — Encounter (HOSPITAL_COMMUNITY): Payer: BC Managed Care – PPO

## 2014-04-24 ENCOUNTER — Encounter (HOSPITAL_COMMUNITY): Payer: BC Managed Care – PPO

## 2014-04-27 ENCOUNTER — Encounter (HOSPITAL_COMMUNITY): Payer: BC Managed Care – PPO

## 2014-04-29 ENCOUNTER — Encounter (HOSPITAL_COMMUNITY): Payer: BC Managed Care – PPO

## 2014-05-01 ENCOUNTER — Encounter (HOSPITAL_COMMUNITY): Payer: BC Managed Care – PPO

## 2014-05-04 ENCOUNTER — Encounter (HOSPITAL_COMMUNITY): Payer: BC Managed Care – PPO

## 2014-05-06 ENCOUNTER — Encounter (HOSPITAL_COMMUNITY): Payer: BC Managed Care – PPO

## 2014-05-08 ENCOUNTER — Encounter: Payer: Self-pay | Admitting: Cardiology

## 2014-05-08 ENCOUNTER — Encounter (HOSPITAL_COMMUNITY): Payer: BC Managed Care – PPO

## 2014-05-09 ENCOUNTER — Encounter: Payer: Self-pay | Admitting: Cardiology

## 2014-05-11 ENCOUNTER — Encounter (HOSPITAL_COMMUNITY): Payer: BC Managed Care – PPO

## 2014-05-11 ENCOUNTER — Encounter: Payer: Self-pay | Admitting: Cardiology

## 2014-05-11 ENCOUNTER — Encounter: Payer: Self-pay | Admitting: Cardiovascular Disease

## 2014-05-13 ENCOUNTER — Encounter (HOSPITAL_COMMUNITY): Payer: BC Managed Care – PPO

## 2014-05-15 ENCOUNTER — Encounter (HOSPITAL_COMMUNITY): Payer: BC Managed Care – PPO

## 2014-06-11 ENCOUNTER — Encounter (HOSPITAL_COMMUNITY): Payer: Self-pay | Admitting: Cardiology

## 2014-06-16 NOTE — Addendum Note (Signed)
Encounter addended by: Cathie Olden, RN on: 06/16/2014  3:20 PM<BR>     Documentation filed: Notes Section

## 2014-06-16 NOTE — Progress Notes (Signed)
Patient is discharged from [facility] program today, March 04, 2014 after 2 sessions due to health.

## 2014-07-27 ENCOUNTER — Telehealth: Payer: Self-pay | Admitting: *Deleted

## 2014-07-27 ENCOUNTER — Ambulatory Visit (INDEPENDENT_AMBULATORY_CARE_PROVIDER_SITE_OTHER): Payer: BLUE CROSS/BLUE SHIELD | Admitting: Cardiology

## 2014-07-27 ENCOUNTER — Encounter: Payer: Self-pay | Admitting: Cardiology

## 2014-07-27 VITALS — BP 127/82 | HR 48 | Ht 68.0 in | Wt 235.4 lb

## 2014-07-27 DIAGNOSIS — N529 Male erectile dysfunction, unspecified: Secondary | ICD-10-CM

## 2014-07-27 DIAGNOSIS — I2510 Atherosclerotic heart disease of native coronary artery without angina pectoris: Secondary | ICD-10-CM

## 2014-07-27 DIAGNOSIS — I4892 Unspecified atrial flutter: Secondary | ICD-10-CM

## 2014-07-27 DIAGNOSIS — I10 Essential (primary) hypertension: Secondary | ICD-10-CM

## 2014-07-27 MED ORDER — AMIODARONE HCL 200 MG PO TABS: 100.0000 mg | ORAL_TABLET | Freq: Every day | ORAL | Status: DC

## 2014-07-27 NOTE — Assessment & Plan Note (Signed)
Blood pressure control is good today. 

## 2014-07-27 NOTE — Progress Notes (Signed)
Reason for visit: CAD, paroxysmal atrial flutter, hypertension  Clinical Summary [name] is a 64 y.o.male last seen in October 2015. Interval record review finds hospitalization at [facility] with treatment of an abscess on the right leg. Patient was placed on IV antibiotics and ultimately underwent incision and drainage by Dr. Anthony Sar, was off Eliquis temporarily.  He presents today for a routine visit. Reports no palpitations or chest pain. We discussed trying to wean his amiodarone further, and perhaps eventually discontinuing it.  He states that he has not gotten back to his regular exercise regimen during the cold weather. Does have a treadmill indoors.  Lab work from November 2015 showed BUN 13, creatinine 1.0, potassium 3.7, hemoglobin 14.1, platelets 237. He has had no bleeding problems on Eliquis.  Echocardiogram from August showed mildly dilated LV with LVEF 50-55%, sclerotic aortic valve with trivial aortic regurgitation, aortic root 38 mm in diastole, mild left atrial enlargement, mild tricuspid regurgitation and pulmonic regurgitation, trivial pericardial effusion posteriorly.  Allergies  Allergen Reactions  . Chlorhexidine Itching    Pt. States on last admission CHG wipes made him itch and it had to be washed off.  Uncertain if benadryl was given.    Current Outpatient Prescriptions  Medication Sig Dispense Refill  . amiodarone (PACERONE) 200 MG tablet Take 0.5 tablets (100 mg total) by mouth daily. 45 tablet 3  . apixaban (ELIQUIS) 5 MG TABS tablet Take 1 tablet (5 mg total) by mouth 2 (two) times daily. 60 tablet 11  . atorvastatin (LIPITOR) 20 MG tablet Take 1 tablet (20 mg total) by mouth daily. 30 tablet 6  . cholecalciferol (VITAMIN D) 1000 UNITS tablet Take 1,000 Units by mouth daily.    . Coenzyme Q10 (CO Q-10) 200 MG CAPS Take 1 capsule by mouth daily.    . cyclobenzaprine (FLEXERIL) 10 MG tablet Take 10 mg by mouth 3 (three) times daily as needed for muscle  spasms.    . fish oil-omega-3 fatty acids 1000 MG capsule Take 1 g by mouth 2 (two) times a week.     . furosemide (LASIX) 20 MG tablet Take 2 tablets (40 mg total) by mouth daily. For 3-4 days, if symptoms improve reduce to 20 mg daily Lab work in 2 weeks around 04/08/14 90 tablet 3  . metoprolol tartrate (LOPRESSOR) 50 MG tablet Take 1.5 tablets (75 mg total) by mouth 2 (two) times daily. 90 tablet 5  . naproxen (NAPROSYN) 500 MG tablet Take 500 mg by mouth as needed.    . Pomegranate, Punica granatum, (POMEGRANATE PO) Take 435 mg by mouth daily.    . tadalafil (CIALIS) 20 MG tablet Take 20 mg by mouth daily as needed for erectile dysfunction.     . TURMERIC PO Take 600 mg by mouth 2 (two) times daily.     No current facility-administered medications for this visit.    Past Medical History  Diagnosis Date  . Coronary atherosclerosis of native coronary artery     Multiple stents - RCA/LAD/diagonal, overlapping DES LAD, stent thrombosis 2005, ultimately CABG  . Essential hypertension, benign   . Peripheral vascular disease   . Myocardial infarction     Anterior with LAD stent thrombosis 2005  . Arthritis   . Hyperlipemia   . History of GI bleed     On nonsteroidals  . Aortic root dilatation     41 mm by CT 2008, 36 mm by echo 2011  . Obesity   . H/O hiatal hernia   .  Sleep apnea     CPAP daily  . Paroxysmal atrial flutter     Past Surgical History  Procedure Laterality Date  . Appendectomy    . Cholecystectomy    . Total knee arthroplasty Right 2006  . Total knee arthroplasty Left 2007  . Elbow arthroscopy Right   . Shoulder arthroscopy Right 4/11  . Cervical fusion  9/10  . Coronary angioplasty with stent placement  [id number]  . Carpal tunnel release Right 6/12  . Carpal tunnel release  06/06/2011    Procedure: CARPAL TUNNEL RELEASE;  Surgeon: Cammie Sickle., MD;  Location: [place];  Service: Orthopedics;  Laterality: Right;  . Ulnar nerve  transposition  06/06/2011    Procedure: ULNAR NERVE DECOMPRESSION/TRANSPOSITION;  Surgeon: Cammie Sickle., MD;  Location: [place];  Service: Orthopedics;  Laterality: Right;  decompression ulnar nerve right cubital tunnel   . Breath tek h pylori  06/11/2012    Procedure: BREATH TEK H PYLORI;  Surgeon: Shann Medal, MD;  Location: [facility];  Service: General;  Laterality: N/A;  Seth Bake  . Laparoscopic gastric banding  3/14  . Carpal tunnel release Right 04/10/2013    Procedure: REVISION OF CARPAL TUNNEL RELEASE LIGAMENT RIGHT WRIST;  Surgeon: Cammie Sickle., MD;  Location: [place];  Service: Orthopedics;  Laterality: Right;  . Coronary artery bypass graft N/A 12/30/2013    Procedure: CORONARY ARTERY BYPASS GRAFTING (CABG) x three, using left internal mammary artery and right leg greater sapheneous vein harvested endoscopically - LIMA to LAD, SVG-D, SVG-OM1;  Surgeon: Ivin Poot, MD;  Location: [place];  Service: Open Heart Surgery;  Laterality: N/A;  . Cardiac surgery    . Left heart catheterization with coronary angiogram N/A 12/26/2013    Procedure: LEFT HEART CATHETERIZATION WITH CORONARY ANGIOGRAM;  Surgeon: Leonie Man, MD;  Location: [facility];  Service: Cardiovascular;  Laterality: N/A;    Family History  Problem Relation Age of Onset  . Colon cancer Father   . Colon cancer Maternal Grandmother   . Obesity Mother   . Diabetes Maternal Grandfather     Social History Mr. Skeels reports that he has never smoked. He quit smokeless tobacco use about 11 years ago. His smokeless tobacco use included Chew. Mr. Cloe reports that he drinks about 0.6 oz of alcohol per week.  Review of Systems Complete review of systems negative except as otherwise outlined in the clinical summary and also the following. Stable appetite. No orthopnea or PND. No claudication. No syncope.  Physical Examination Filed Vitals:   07/27/14 1038  BP:  127/82  Pulse: 48    Wt Readings from Last 3 Encounters:  07/27/14 235 lb 6.4 oz (106.777 kg)  04/15/14 237 lb 4 oz (107.616 kg)  03/11/14 231 lb (104.781 kg)   Obese male, appears comfortable at rest.  HEENT: Conjunctiva and lids normal, oropharynx clear.  Neck: Supple, no elevated JVP or carotid bruits, no thyromegaly.  Lungs: Clear to auscultation, nonlabored breathing at rest.  Thorax: Well-healed sternal incision, no erythema or drainage.  Cardiac: Regular rate and rhythm, no S3 or significant systolic murmur, no pericardial rub.  Abdomen: Soft, obese, nontender, bowel sounds present.  Extremities: No edema, distal pulses 2+.  Skin: Warm and dry.  Musculoskeletal: No kyphosis.  Neuropsychiatric: Alert and oriented x3, affect grossly appropriate.   Problem List and Plan   Paroxysmal atrial flutter Continue Lopressor and Eliquis, cut amiodarone back  to 100 mg daily. We may eventually try to stop this medication altogether.   Essential hypertension, benign Blood pressure control is good today.   CAD, NATIVE VESSEL Symptomatically stable status post CABG in June 2015. He remains on beta blocker and statin therapy. Not on long-acting nitrates.   Erectile dysfunction Followed by Dr. Nadara Mustard, on Cialis. I reiterated that he does not have any direct cardiac contraindication to using Cialis at this particular time.     Satira Sark, M.D., F.A.C.C.

## 2014-07-27 NOTE — Assessment & Plan Note (Signed)
Symptomatically stable status post CABG in June 2015. He remains on beta blocker and statin therapy. Not on long-acting nitrates.

## 2014-07-27 NOTE — Assessment & Plan Note (Signed)
Continue Lopressor and Eliquis, cut amiodarone back to 100 mg daily. We may eventually try to stop this medication altogether.

## 2014-07-27 NOTE — Assessment & Plan Note (Signed)
Followed by Dr. Nadara Mustard, on Cialis. I reiterated that he does not have any direct cardiac contraindication to using Cialis at this particular time.

## 2014-07-27 NOTE — Patient Instructions (Signed)
Your physician recommends that you schedule a follow-up appointment in: 3 months. Your physician has recommended you make the following change in your medication:  Decrease amiodarone to 100 mg daily. Continue all other medications the same.

## 2014-07-27 NOTE — Telephone Encounter (Signed)
Called and left message with front staff at [facility] office that per Dr. Domenic Polite, its okay for this patient be prescribed cialis by Dr. Nadara Mustard.

## 2014-08-27 ENCOUNTER — Other Ambulatory Visit: Payer: Self-pay | Admitting: Cardiology

## 2014-10-29 ENCOUNTER — Ambulatory Visit (INDEPENDENT_AMBULATORY_CARE_PROVIDER_SITE_OTHER): Payer: BLUE CROSS/BLUE SHIELD | Admitting: Cardiology

## 2014-10-29 ENCOUNTER — Encounter: Payer: Self-pay | Admitting: Cardiology

## 2014-10-29 VITALS — BP 130/80 | HR 55 | Ht 68.0 in | Wt 233.0 lb

## 2014-10-29 DIAGNOSIS — I4892 Unspecified atrial flutter: Secondary | ICD-10-CM | POA: Diagnosis not present

## 2014-10-29 DIAGNOSIS — I2510 Atherosclerotic heart disease of native coronary artery without angina pectoris: Secondary | ICD-10-CM | POA: Diagnosis not present

## 2014-10-29 DIAGNOSIS — E782 Mixed hyperlipidemia: Secondary | ICD-10-CM

## 2014-10-29 MED ORDER — ASPIRIN EC 81 MG PO TBEC: 81.0000 mg | DELAYED_RELEASE_TABLET | Freq: Every day | ORAL | Status: DC

## 2014-10-29 NOTE — Patient Instructions (Signed)
Your physician has recommended you make the following change in your medication: Stop amiodarone. Stop eliquis. Start aspirin 81 mg a few days after being off eliquis. Continue all other medications the same. Your physician recommends that you schedule a follow-up appointment in: 6 months. You will receive a reminder letter in the mail in about 4 months reminding you to call and schedule your appointment. If you don't receive this letter, please contact our office.

## 2014-10-29 NOTE — Progress Notes (Signed)
Cardiology Office Note  Date: 10/29/2014   ID: [name], DOB [birth year], MRN [id number]  PCP: Rory Percy, MD  Primary Cardiologist: Rozann Lesches, MD   Chief Complaint  Patient presents with  . Coronary Artery Disease  . Atrial Flutter    History of Present Illness: [name] is a 64 y.o. male last seen in January. He presents for a routine visit.  Since I last saw him he had a trip to [place] with some of his old college roommates. He reports no chest pain or palpitations, has been on a stable medical regimen as outlined below. He tells that he would like to try to come off anticoagulant therapy and amiodarone.  He reports stable NYHA class II dyspnea, no dizziness , orthopnea, or PND.  He has been walking for exercise, reports no claudication. Otherwise tolerating his medications.   Past Medical History  Diagnosis Date  . Coronary atherosclerosis of native coronary artery     Multiple stents - RCA/LAD/diagonal, overlapping DES LAD, stent thrombosis 2005, ultimately CABG  . Essential hypertension, benign   . Peripheral vascular disease   . Myocardial infarction     Anterior with LAD stent thrombosis 2005  . Arthritis   . Hyperlipemia   . History of GI bleed     On nonsteroidals  . Aortic root dilatation     41 mm by CT 2008, 36 mm by echo 2011  . Obesity   . H/O hiatal hernia   . Sleep apnea     CPAP daily  . Paroxysmal atrial flutter     Past Surgical History  Procedure Laterality Date  . Appendectomy    . Cholecystectomy    . Total knee arthroplasty Right 2006  . Total knee arthroplasty Left 2007  . Elbow arthroscopy Right   . Shoulder arthroscopy Right 4/11  . Cervical fusion  9/10  . Coronary angioplasty with stent placement  8921,[id number]  . Carpal tunnel release Right 6/12  . Carpal tunnel release  06/06/2011    Procedure: CARPAL TUNNEL RELEASE;  Surgeon: Cammie Sickle., MD;  Location: [place];  Service:  Orthopedics;  Laterality: Right;  . Ulnar nerve transposition  06/06/2011    Procedure: ULNAR NERVE DECOMPRESSION/TRANSPOSITION;  Surgeon: Cammie Sickle., MD;  Location: [place];  Service: Orthopedics;  Laterality: Right;  decompression ulnar nerve right cubital tunnel   . Breath tek h pylori  06/11/2012    Procedure: BREATH TEK H PYLORI;  Surgeon: Shann Medal, MD;  Location: [facility];  Service: General;  Laterality: N/A;  Seth Bake  . Laparoscopic gastric banding  3/14  . Carpal tunnel release Right 04/10/2013    Procedure: REVISION OF CARPAL TUNNEL RELEASE LIGAMENT RIGHT WRIST;  Surgeon: Cammie Sickle., MD;  Location: [place];  Service: Orthopedics;  Laterality: Right;  . Coronary artery bypass graft N/A 12/30/2013    Procedure: CORONARY ARTERY BYPASS GRAFTING (CABG) x three, using left internal mammary artery and right leg greater sapheneous vein harvested endoscopically - LIMA to LAD, SVG-D, SVG-OM1;  Surgeon: Ivin Poot, MD;  Location: [place];  Service: Open Heart Surgery;  Laterality: N/A;  . Cardiac surgery    . Left heart catheterization with coronary angiogram N/A 12/26/2013    Procedure: LEFT HEART CATHETERIZATION WITH CORONARY ANGIOGRAM;  Surgeon: Leonie Man, MD;  Location: [facility];  Service: Cardiovascular;  Laterality: N/A;    Current Outpatient Prescriptions  Medication Sig Dispense Refill  . atorvastatin (LIPITOR) 20 MG tablet TAKE 1 TABLET ONCE DAILY. 30 tablet 6  . cholecalciferol (VITAMIN D) 1000 UNITS tablet Take 1,000 Units by mouth daily.    . Coenzyme Q10 (CO Q-10) 200 MG CAPS Take 1 capsule by mouth daily.    . cyclobenzaprine (FLEXERIL) 10 MG tablet Take 10 mg by mouth 3 (three) times daily as needed for muscle spasms.    . fish oil-omega-3 fatty acids 1000 MG capsule Take 1 g by mouth 2 (two) times a week.     . metoprolol tartrate (LOPRESSOR) 50 MG tablet Take 1.5 tablets (75 mg total) by mouth 2 (two) times  daily. 90 tablet 5  . naproxen (NAPROSYN) 500 MG tablet Take 500 mg by mouth as needed.    . Pomegranate, Punica granatum, (POMEGRANATE PO) Take 435 mg by mouth daily.    . tadalafil (CIALIS) 20 MG tablet Take 20 mg by mouth daily as needed for erectile dysfunction.     . TURMERIC PO Take 600 mg by mouth 2 (two) times daily.    Derrill Memo ON 11/01/2014] aspirin EC 81 MG tablet Take 1 tablet (81 mg total) by mouth daily. Start after stopping eliquis 90 tablet 3   No current facility-administered medications for this visit.    Allergies:  Chlorhexidine   Social History: The patient  reports that he has never smoked. He quit smokeless tobacco use about 11 years ago. His smokeless tobacco use included Chew. He reports that he drinks about 0.6 oz of alcohol per week. He reports that he does not use illicit drugs.   ROS:  Please see the history of present illness. Otherwise, complete review of systems is positive for erectile dysfunction.  All other systems are reviewed and negative.   Physical Exam: VS:  BP 130/80 mmHg  Pulse 55  Ht 5\' 8"  (1.727 m)  Wt 233 lb (105.688 kg)  BMI 35.44 kg/m2  SpO2 95%, BMI Body mass index is 35.44 kg/(m^2).  Wt Readings from Last 3 Encounters:  10/29/14 233 lb (105.688 kg)  07/27/14 235 lb 6.4 oz (106.777 kg)  04/15/14 237 lb 4 oz (107.616 kg)     Obese male, appears comfortable at rest.  HEENT: Conjunctiva and lids normal, oropharynx clear.  Neck: Supple, no elevated JVP or carotid bruits, no thyromegaly.  Lungs: Clear to auscultation, nonlabored breathing at rest.  Thorax: Well-healed sternal incision, no erythema or drainage.  Cardiac: Regular rate and rhythm, no S3 or significant systolic murmur, no pericardial rub.  Abdomen: Soft, obese, nontender, bowel sounds present.  Extremities: No edema, distal pulses 2+.  Skin: Warm and dry.  Musculoskeletal: No kyphosis.  Neuropsychiatric: Alert and oriented x3, affect grossly appropriate.   ECG:  ECG is not ordered today.   Recent Labwork: 12/31/2013: Magnesium 2.1 02/21/2014: Pro B Natriuretic peptide (BNP) 278.1*; TSH 1.370 02/22/2014: Hemoglobin 14.1; Platelets 224 02/23/2014: BUN 16; Creatinine 0.91; Potassium 3.5*; Sodium 138     Component Value Date/Time   CHOL 115 02/22/2014 0630   TRIG 63 02/22/2014 0630   HDL 52 02/22/2014 0630   CHOLHDL 2.2 02/22/2014 0630   VLDL 13 02/22/2014 0630   LDLCALC 50 02/22/2014 0630    Other Studies Reviewed Today:  Echocardiogram from August 2015 showed mildly dilated LV with LVEF 50-55%, sclerotic aortic valve with trivial aortic regurgitation, aortic root 38 mm in diastole, mild left atrial enlargement, mild tricuspid regurgitation and pulmonic regurgitation, trivial pericardial effusion posteriorly.  Assessment and  Plan:  1.  Paroxysmal atrial flutter, quiescent for the last several months.  Mr. Mathey would like to try and come off of anticoagulant and antiarrhythmic therapy. He will stop amiodarone and Eliquis, we will continue to observe back on aspirin and continuing beta blocker. If he has recurring atrial arrhythmias, we can discuss implications for continuing treatment long-term including anticoagulation for stroke reduction.  2.  CAD status post CABG in June 2015. Stable without angina symptoms.  3.  Hyperlipidemia, on Lipitor. Lipids have been well controlled.  Current medicines were reviewed with the patient today.   Disposition: FU with me in 6 months.   Signed, Satira Sark, MD, [facility] 10/29/2014 9:05 AM    [facility] [place], [place], [place] [address] Phone: [phone]; Fax: [phone]

## 2015-02-08 ENCOUNTER — Emergency Department (HOSPITAL_COMMUNITY)
Admission: EM | Admit: 2015-02-08 | Discharge: 2015-02-08 | Disposition: A | Discharge status: Home / Self Care | Payer: BLUE CROSS/BLUE SHIELD | Attending: Emergency Medicine | Admitting: Emergency Medicine

## 2015-02-08 ENCOUNTER — Emergency Department (HOSPITAL_COMMUNITY): Payer: BLUE CROSS/BLUE SHIELD

## 2015-02-08 ENCOUNTER — Encounter (HOSPITAL_COMMUNITY): Payer: Self-pay | Admitting: Neurology

## 2015-02-08 DIAGNOSIS — M1990 Unspecified osteoarthritis, unspecified site: Secondary | ICD-10-CM | POA: Insufficient documentation

## 2015-02-08 DIAGNOSIS — I252 Old myocardial infarction: Secondary | ICD-10-CM | POA: Insufficient documentation

## 2015-02-08 DIAGNOSIS — E669 Obesity, unspecified: Secondary | ICD-10-CM | POA: Insufficient documentation

## 2015-02-08 DIAGNOSIS — E785 Hyperlipidemia, unspecified: Secondary | ICD-10-CM | POA: Insufficient documentation

## 2015-02-08 DIAGNOSIS — Z79899 Other long term (current) drug therapy: Secondary | ICD-10-CM | POA: Insufficient documentation

## 2015-02-08 DIAGNOSIS — K2970 Gastritis, unspecified, without bleeding: Secondary | ICD-10-CM | POA: Diagnosis not present

## 2015-02-08 DIAGNOSIS — Z9981 Dependence on supplemental oxygen: Secondary | ICD-10-CM | POA: Diagnosis not present

## 2015-02-08 DIAGNOSIS — Z7982 Long term (current) use of aspirin: Secondary | ICD-10-CM | POA: Diagnosis not present

## 2015-02-08 DIAGNOSIS — I10 Essential (primary) hypertension: Secondary | ICD-10-CM | POA: Insufficient documentation

## 2015-02-08 DIAGNOSIS — G4730 Sleep apnea, unspecified: Secondary | ICD-10-CM | POA: Diagnosis not present

## 2015-02-08 DIAGNOSIS — R079 Chest pain, unspecified: Secondary | ICD-10-CM | POA: Diagnosis present

## 2015-02-08 LAB — BASIC METABOLIC PANEL
Anion gap: 10 (ref 5–15)
BUN: 13 mg/dL (ref 6–20)
CHLORIDE: 104 mmol/L (ref 101–111)
CO2: 22 mmol/L (ref 22–32)
Calcium: 8.9 mg/dL (ref 8.9–10.3)
Creatinine, Ser: 0.85 mg/dL (ref 0.61–1.24)
Glucose, Bld: 110 mg/dL — ABNORMAL HIGH (ref 65–99)
Potassium: 3.9 mmol/L (ref 3.5–5.1)
Sodium: 136 mmol/L (ref 135–145)

## 2015-02-08 LAB — I-STAT TROPONIN, ED: Troponin i, poc: 0 ng/mL (ref 0.00–0.08)

## 2015-02-08 LAB — CBC
HCT: 45.8 % (ref 39.0–52.0)
Hemoglobin: 16 g/dL (ref 13.0–17.0)
MCH: 31.4 pg (ref 26.0–34.0)
MCHC: 34.9 g/dL (ref 30.0–36.0)
MCV: 90 fL (ref 78.0–100.0)
Platelets: 203 10*3/uL (ref 150–400)
RBC: 5.09 MIL/uL (ref 4.22–5.81)
RDW: 12.9 % (ref 11.5–15.5)
WBC: 7.2 10*3/uL (ref 4.0–10.5)

## 2015-02-08 MED ORDER — ALUM & MAG HYDROXIDE-SIMETH 200-200-20 MG/5ML PO SUSP
15.0000 mL | Freq: Once | ORAL | Status: AC
  Administered 2015-02-08: 15 mL via ORAL
  Filled 2015-02-08: qty 30

## 2015-02-08 MED ORDER — PANTOPRAZOLE SODIUM 40 MG PO TBEC
40.0000 mg | DELAYED_RELEASE_TABLET | Freq: Every day | ORAL | Status: DC
  Administered 2015-02-08: 40 mg via ORAL
  Filled 2015-02-08: qty 1

## 2015-02-08 NOTE — ED Provider Notes (Signed)
CSN: [id number]     Arrival date & time 02/08/15  1550 History   First MD Initiated Contact with Patient 02/08/15 2028     Chief Complaint  Patient presents with  . Chest Pain     (Consider location/radiation/quality/duration/timing/severity/associated sxs/prior Treatment) HPI   [name] is a 64 y.o. male who was awakened today by burning pain in lower anterior chest. The pain comes and goew. He has had it several times in the last few mos. No meds tried. No cough, SOB, weakness, dizziness or diaphoresis. No cardiac history. No meds tried. There are no other known modifying factors.   Past Medical History  Diagnosis Date  . Coronary atherosclerosis of native coronary artery     Multiple stents - RCA/LAD/diagonal, overlapping DES LAD, stent thrombosis 2005, ultimately CABG  . Essential hypertension, benign   . Peripheral vascular disease   . Myocardial infarction     Anterior with LAD stent thrombosis 2005  . Arthritis   . Hyperlipemia   . History of GI bleed     On nonsteroidals  . Aortic root dilatation     41 mm by CT 2008, 36 mm by echo 2011  . Obesity   . H/O hiatal hernia   . Sleep apnea     CPAP daily  . Paroxysmal atrial flutter    Past Surgical History  Procedure Laterality Date  . Appendectomy    . Cholecystectomy    . Total knee arthroplasty Right 2006  . Total knee arthroplasty Left 2007  . Elbow arthroscopy Right   . Shoulder arthroscopy Right 4/11  . Cervical fusion  9/10  . Coronary angioplasty with stent placement  9381,8299  . Carpal tunnel release Right 6/12  . Carpal tunnel release  06/06/2011    Procedure: CARPAL TUNNEL RELEASE;  Surgeon: Cammie Sickle., MD;  Location: [place];  Service: Orthopedics;  Laterality: Right;  . Ulnar nerve transposition  06/06/2011    Procedure: ULNAR NERVE DECOMPRESSION/TRANSPOSITION;  Surgeon: Cammie Sickle., MD;  Location: [place];  Service: Orthopedics;  Laterality:  Right;  decompression ulnar nerve right cubital tunnel   . Breath tek h pylori  06/11/2012    Procedure: BREATH TEK H PYLORI;  Surgeon: Shann Medal, MD;  Location: [facility];  Service: General;  Laterality: N/A;  Seth Bake  . Laparoscopic gastric banding  3/14  . Carpal tunnel release Right 04/10/2013    Procedure: REVISION OF CARPAL TUNNEL RELEASE LIGAMENT RIGHT WRIST;  Surgeon: Cammie Sickle., MD;  Location: [place];  Service: Orthopedics;  Laterality: Right;  . Coronary artery bypass graft N/A 12/30/2013    Procedure: CORONARY ARTERY BYPASS GRAFTING (CABG) x three, using left internal mammary artery and right leg greater sapheneous vein harvested endoscopically - LIMA to LAD, SVG-D, SVG-OM1;  Surgeon: Ivin Poot, MD;  Location: [place];  Service: Open Heart Surgery;  Laterality: N/A;  . Cardiac surgery    . Left heart catheterization with coronary angiogram N/A 12/26/2013    Procedure: LEFT HEART CATHETERIZATION WITH CORONARY ANGIOGRAM;  Surgeon: Leonie Man, MD;  Location: [facility] CATH LAB;  Service: Cardiovascular;  Laterality: N/A;   Family History  Problem Relation Age of Onset  . Colon cancer Father   . Colon cancer Maternal Grandmother   . Obesity Mother   . Diabetes Maternal Grandfather    Social History  Substance Use Topics  . Smoking status: Never Smoker   .  Smokeless tobacco: Former Systems developer    Types: Chew    Quit date: 07/03/2003  . Alcohol Use: 0.6 oz/week    1 Cans of beer per week     Comment:  sometimes 4 to 5 beers a day - but not every day    Review of Systems  All other systems reviewed and are negative.     Allergies  Chlorhexidine  Home Medications   Prior to Admission medications   Medication Sig Start Date End Date Taking? Authorizing Provider  aspirin EC 81 MG tablet Take 1 tablet (81 mg total) by mouth daily. Start after stopping eliquis 11/01/14  Yes Satira Sark, MD  atorvastatin (LIPITOR) 20 MG tablet TAKE 1 TABLET  ONCE DAILY. 08/27/14  Yes Satira Sark, MD  cholecalciferol (VITAMIN D) 1000 UNITS tablet Take 1,000 Units by mouth daily.   Yes Historical Provider, MD  Coenzyme Q10 (CO Q-10) 200 MG CAPS Take 1 capsule by mouth daily.   Yes Historical Provider, MD  fish oil-omega-3 fatty acids 1000 MG capsule Take 1 g by mouth 2 (two) times daily.    Yes Historical Provider, MD  metoprolol tartrate (LOPRESSOR) 50 MG tablet Take 1.5 tablets (75 mg total) by mouth 2 (two) times daily. 02/24/14  Yes Brittainy M Simmons, PA-C  Pomegranate, Punica granatum, (POMEGRANATE PO) Take 435 mg by mouth 2 (two) times daily.    Yes Historical Provider, MD  tadalafil (CIALIS) 20 MG tablet Take 20 mg by mouth daily as needed for erectile dysfunction.    Yes Historical Provider, MD  naproxen (NAPROSYN) 500 MG tablet Take 500 mg by mouth as needed.    Historical Provider, MD  TURMERIC PO Take 600 mg by mouth 2 (two) times daily.    Historical Provider, MD   BP 148/92 mmHg  Pulse 59  Temp(Src) 97.9 F (36.6 C) (Oral)  Resp 13  Ht 5\' 8"  (1.727 m)  Wt 228 lb (103.42 kg)  BMI 34.68 kg/m2  SpO2 99% Physical Exam  Constitutional: He is oriented to person, place, and time. He appears well-developed and well-nourished.  HENT:  Head: Normocephalic and atraumatic.  Right Ear: External ear normal.  Left Ear: External ear normal.  Eyes: Conjunctivae and EOM are normal. Pupils are equal, round, and reactive to light.  Neck: Normal range of motion and phonation normal. Neck supple.  Cardiovascular: Normal rate, regular rhythm and normal heart sounds.   Pulmonary/Chest: Effort normal and breath sounds normal. He exhibits no bony tenderness.  Abdominal: Soft. There is no tenderness.  Musculoskeletal: Normal range of motion.  Neurological: He is alert and oriented to person, place, and time. No cranial nerve deficit or sensory deficit. He exhibits normal muscle tone. Coordination normal.  Skin: Skin is warm, dry and intact.   Psychiatric: He has a normal mood and affect. His behavior is normal. Judgment and thought content normal.  Nursing note and vitals reviewed.   ED Course  Procedures (including critical care time) Medications  alum & mag hydroxide-simeth (MAALOX/MYLANTA) 200-200-20 MG/5ML suspension 15 mL (15 mLs Oral Given 02/08/15 2056)    No data found.  Findings discussed with patient, all questions answered.   Labs Review Labs Reviewed  BASIC METABOLIC PANEL - Abnormal; Notable for the following:    Glucose, Bld 110 (*)    All other components within normal limits  CBC  I-STAT TROPOININ, ED    Imaging Review Dg Chest 2 View  02/08/2015   CLINICAL DATA:  Chest tightness for  4 days  EXAM: CHEST - 2 VIEW  COMPARISON:  03/25/2014  FINDINGS: Postsurgical changes are again seen. Cardiac shadow is stable. The lungs are clear bilaterally. Mild hyperinflation is noted. Gastric lap band is noted in the upper abdomen. No bony abnormality is seen.  IMPRESSION: No acute abnormality noted.   Electronically Signed   By: Inez Catalina M.D.   On: 02/08/2015 16:26     EKG Interpretation   Date/Time:  Monday February 08 2015 15:55:40 EDT Ventricular Rate:  61 PR Interval:  204 QRS Duration: 96 QT Interval:  442 QTC Calculation: 444 R Axis:   82 Text Interpretation:  Normal sinus rhythm Possible Anterior infarct , age  undetermined Abnormal ECG Since last tracing Nonspecific ST and T wave  abnormality has resolved Confirmed by Eulis Foster  MD, Jaquia Benedicto [phone]) on  02/08/2015 8:30:01 PM      MDM   Final diagnoses:  Gastritis    Eval. C/w non-cardiac CP, likely gastritis. Doubt ACS, PE or PNE.  Nursing Notes Reviewed/ Care Coordinated Applicable Imaging Reviewed Interpretation of Laboratory Data incorporated into ED treatment  The patient appears reasonably screened and/or stabilized for discharge and I doubt any other medical condition or other [facility] requiring further screening, evaluation, or treatment in  the ED at this time prior to discharge.  Plan: Home Medications- PPI and Antacid of choicerest; Home Treatments- rest; return here if the recommended treatment, does not improve the symptoms; Recommended follow up- PCP 1-2 weeks     Daleen Bo, MD 02/10/15 1303

## 2015-02-08 NOTE — ED Notes (Signed)
Pt reports cp since last week that is intermittent. This morning had cp that woke him up from sleep. Pain has been in center of chest, no radiation. Has hx of MI. Denies sob, or n/v.

## 2015-02-08 NOTE — Discharge Instructions (Signed)
Take Nexium (20 mg) or Prevacid (30 mg), daily for 1 month. Also, use Maalox or Mylanta before meals and at bedtime for 1 week.    Gastritis, Adult Gastritis is soreness and swelling (inflammation) of the lining of the stomach. Gastritis can develop as a sudden onset (acute) or long-term (chronic) condition. If gastritis is not treated, it can lead to stomach bleeding and ulcers. CAUSES  Gastritis occurs when the stomach lining is weak or damaged. Digestive juices from the stomach then inflame the weakened stomach lining. The stomach lining may be weak or damaged due to viral or bacterial infections. One common bacterial infection is the Helicobacter pylori infection. Gastritis can also result from excessive alcohol consumption, taking certain medicines, or having too much acid in the stomach.  SYMPTOMS  In some cases, there are no symptoms. When symptoms are present, they may include:  Pain or a burning sensation in the upper abdomen.  Nausea.  Vomiting.  An uncomfortable feeling of fullness after eating. DIAGNOSIS  Your caregiver may suspect you have gastritis based on your symptoms and a physical exam. To determine the cause of your gastritis, your caregiver may perform the following:  Blood or stool tests to check for the H pylori bacterium.  Gastroscopy. A thin, flexible tube (endoscope) is passed down the esophagus and into the stomach. The endoscope has a light and camera on the end. Your caregiver uses the endoscope to view the inside of the stomach.  Taking a tissue sample (biopsy) from the stomach to examine under a microscope. TREATMENT  Depending on the cause of your gastritis, medicines may be prescribed. If you have a bacterial infection, such as an H pylori infection, antibiotics may be given. If your gastritis is caused by too much acid in the stomach, H2 blockers or antacids may be given. Your caregiver may recommend that you stop taking aspirin, ibuprofen, or other  nonsteroidal anti-inflammatory drugs (NSAIDs). HOME CARE INSTRUCTIONS  Only take over-the-counter or prescription medicines as directed by your caregiver.  If you were given antibiotic medicines, take them as directed. Finish them even if you start to feel better.  Drink enough fluids to keep your urine clear or pale yellow.  Avoid foods and drinks that make your symptoms worse, such as:  Caffeine or alcoholic drinks.  Chocolate.  Peppermint or mint flavorings.  Garlic and onions.  Spicy foods.  Citrus fruits, such as oranges, lemons, or limes.  Tomato-based foods such as sauce, chili, salsa, and pizza.  Fried and fatty foods.  Eat small, frequent meals instead of large meals. SEEK IMMEDIATE MEDICAL CARE IF:   You have black or dark red stools.  You vomit blood or material that looks like coffee grounds.  You are unable to keep fluids down.  Your abdominal pain gets worse.  You have a fever.  You do not feel better after 1 week.  You have any other questions or concerns. MAKE SURE YOU:  Understand these instructions.  Will watch your condition.  Will get help right away if you are not doing well or get worse. Document Released: 06/13/2001 Document Revised: 12/19/2011 Document Reviewed: 08/02/2011 [facility] Patient Information 2015 [place], [place]. This information is not intended to replace advice given to you by your health care provider. Make sure you discuss any questions you have with your health care provider.

## 2015-04-06 ENCOUNTER — Other Ambulatory Visit: Payer: Self-pay | Admitting: *Deleted

## 2015-04-06 MED ORDER — ATORVASTATIN CALCIUM 20 MG PO TABS: 20.0000 mg | ORAL_TABLET | Freq: Every day | ORAL | Status: DC

## 2015-04-15 DIAGNOSIS — I10 Essential (primary) hypertension: Secondary | ICD-10-CM | POA: Diagnosis not present

## 2015-04-15 DIAGNOSIS — I2510 Atherosclerotic heart disease of native coronary artery without angina pectoris: Secondary | ICD-10-CM | POA: Diagnosis not present

## 2015-04-15 DIAGNOSIS — Z23 Encounter for immunization: Secondary | ICD-10-CM | POA: Diagnosis not present

## 2015-04-15 DIAGNOSIS — G4733 Obstructive sleep apnea (adult) (pediatric): Secondary | ICD-10-CM | POA: Diagnosis not present

## 2015-04-15 DIAGNOSIS — E291 Testicular hypofunction: Secondary | ICD-10-CM | POA: Diagnosis not present

## 2015-05-03 DIAGNOSIS — J0101 Acute recurrent maxillary sinusitis: Secondary | ICD-10-CM | POA: Diagnosis not present

## 2015-05-07 DIAGNOSIS — J0101 Acute recurrent maxillary sinusitis: Secondary | ICD-10-CM | POA: Diagnosis not present

## 2015-05-13 DIAGNOSIS — H6501 Acute serous otitis media, right ear: Secondary | ICD-10-CM | POA: Diagnosis not present

## 2015-05-13 DIAGNOSIS — J0101 Acute recurrent maxillary sinusitis: Secondary | ICD-10-CM | POA: Diagnosis not present

## 2015-06-08 ENCOUNTER — Other Ambulatory Visit: Payer: Self-pay | Admitting: Cardiology

## 2015-11-24 DIAGNOSIS — M1990 Unspecified osteoarthritis, unspecified site: Secondary | ICD-10-CM | POA: Diagnosis not present

## 2015-11-24 DIAGNOSIS — G4733 Obstructive sleep apnea (adult) (pediatric): Secondary | ICD-10-CM | POA: Diagnosis not present

## 2015-11-24 DIAGNOSIS — E7800 Pure hypercholesterolemia, unspecified: Secondary | ICD-10-CM | POA: Diagnosis not present

## 2015-11-24 DIAGNOSIS — E1165 Type 2 diabetes mellitus with hyperglycemia: Secondary | ICD-10-CM | POA: Diagnosis not present

## 2015-11-24 DIAGNOSIS — E290 Testicular hyperfunction: Secondary | ICD-10-CM | POA: Diagnosis not present

## 2015-11-24 DIAGNOSIS — Z13220 Encounter for screening for lipoid disorders: Secondary | ICD-10-CM | POA: Diagnosis not present

## 2015-11-24 DIAGNOSIS — E299 Testicular dysfunction, unspecified: Secondary | ICD-10-CM | POA: Diagnosis not present

## 2015-11-24 DIAGNOSIS — E298 Other testicular dysfunction: Secondary | ICD-10-CM | POA: Diagnosis not present

## 2015-11-24 DIAGNOSIS — I10 Essential (primary) hypertension: Secondary | ICD-10-CM | POA: Diagnosis not present

## 2015-11-24 DIAGNOSIS — E291 Testicular hypofunction: Secondary | ICD-10-CM | POA: Diagnosis not present

## 2015-12-01 DIAGNOSIS — Z0001 Encounter for general adult medical examination with abnormal findings: Secondary | ICD-10-CM | POA: Diagnosis not present

## 2015-12-01 DIAGNOSIS — I2510 Atherosclerotic heart disease of native coronary artery without angina pectoris: Secondary | ICD-10-CM | POA: Diagnosis not present

## 2015-12-01 DIAGNOSIS — Z1389 Encounter for screening for other disorder: Secondary | ICD-10-CM | POA: Diagnosis not present

## 2015-12-01 DIAGNOSIS — G4733 Obstructive sleep apnea (adult) (pediatric): Secondary | ICD-10-CM | POA: Diagnosis not present

## 2015-12-01 DIAGNOSIS — E7800 Pure hypercholesterolemia, unspecified: Secondary | ICD-10-CM | POA: Diagnosis not present

## 2015-12-01 DIAGNOSIS — E291 Testicular hypofunction: Secondary | ICD-10-CM | POA: Diagnosis not present

## 2015-12-01 DIAGNOSIS — E1165 Type 2 diabetes mellitus with hyperglycemia: Secondary | ICD-10-CM | POA: Diagnosis not present

## 2015-12-01 DIAGNOSIS — Z13220 Encounter for screening for lipoid disorders: Secondary | ICD-10-CM | POA: Diagnosis not present

## 2015-12-01 DIAGNOSIS — I10 Essential (primary) hypertension: Secondary | ICD-10-CM | POA: Diagnosis not present

## 2015-12-15 DIAGNOSIS — H2513 Age-related nuclear cataract, bilateral: Secondary | ICD-10-CM | POA: Diagnosis not present

## 2015-12-15 DIAGNOSIS — D3130 Benign neoplasm of unspecified choroid: Secondary | ICD-10-CM | POA: Diagnosis not present

## 2016-03-23 DIAGNOSIS — Z23 Encounter for immunization: Secondary | ICD-10-CM | POA: Diagnosis not present

## 2016-06-16 ENCOUNTER — Ambulatory Visit: Payer: BLUE CROSS/BLUE SHIELD | Admitting: Cardiology

## 2016-06-21 ENCOUNTER — Ambulatory Visit: Payer: BLUE CROSS/BLUE SHIELD | Admitting: Cardiology

## 2016-07-05 ENCOUNTER — Encounter: Payer: Self-pay | Admitting: *Deleted

## 2016-07-06 ENCOUNTER — Encounter: Payer: Self-pay | Admitting: Cardiology

## 2016-07-06 ENCOUNTER — Ambulatory Visit (INDEPENDENT_AMBULATORY_CARE_PROVIDER_SITE_OTHER): Payer: Medicare Other | Admitting: Cardiology

## 2016-07-06 VITALS — BP 128/80 | HR 58 | Ht 68.0 in | Wt 253.2 lb

## 2016-07-06 DIAGNOSIS — I10 Essential (primary) hypertension: Secondary | ICD-10-CM

## 2016-07-06 DIAGNOSIS — E782 Mixed hyperlipidemia: Secondary | ICD-10-CM | POA: Diagnosis not present

## 2016-07-06 DIAGNOSIS — I2510 Atherosclerotic heart disease of native coronary artery without angina pectoris: Secondary | ICD-10-CM

## 2016-07-06 DIAGNOSIS — I4892 Unspecified atrial flutter: Secondary | ICD-10-CM

## 2016-07-06 NOTE — Progress Notes (Signed)
Cardiology Office Note  Date: 07/06/2016   ID: ZOHAIB CRAFT, DOB [birth year], MRN [id number]  PCP: Rory Percy, MD  Primary Cardiologist: Rozann Lesches, MD   Chief Complaint  Patient presents with  . Coronary Artery Disease    History of Present Illness: [name] is a 66 y.o. male last seen in April 2016. He presents for a routine follow-up visit. Reports no major change in stamina, no exertional chest pain or palpitations. Typically walks on the treadmill for exercise, has dropped off some over the holidays.  At the last visit [name] preferred to come off of anticoagulation and amiodarone. He has had no obvious recurrent atrial flutter, diagnosed in 2015 after CABG. I reviewed his ECG today which shows sinus bradycardia with decreased R wave progression, rule out old anterior infarct pattern.  Last stress test was in 2013 as outlined below. He underwent CABG in 2015. We have discussed plan for follow-up ischemic testing in the future, he remains comfortable with observation for now.   I reviewed his medications. Cardiac regimen includes aspirin, Lipitor, and Lopressor.  Past Medical History:  Diagnosis Date  . Aortic root dilatation (HCC)    41 mm by CT 2008, 36 mm by echo 2011  . Arthritis   . Coronary atherosclerosis of native coronary artery    Multiple stents - RCA/LAD/diagonal, overlapping DES LAD, stent thrombosis 2005, ultimately CABG  . Essential hypertension, benign   . H/O hiatal hernia   . History of GI bleed    On nonsteroidals  . Hyperlipemia   . Myocardial infarction    Anterior with LAD stent thrombosis 2005  . Obesity   . Paroxysmal atrial flutter ([place])   . Peripheral vascular disease ([place])   . Sleep apnea    CPAP daily    Past Surgical History:  Procedure Laterality Date  . APPENDECTOMY    . BREATH TEK H PYLORI  06/11/2012   Procedure: BREATH TEK H PYLORI;  Surgeon: Shann Medal, MD;  Location: [facility];  Service: General;   Laterality: N/A;  Seth Bake  . CARDIAC SURGERY    . CARPAL TUNNEL RELEASE Right 6/12  . CARPAL TUNNEL RELEASE  06/06/2011   Procedure: CARPAL TUNNEL RELEASE;  Surgeon: Cammie Sickle., MD;  Location: [place];  Service: Orthopedics;  Laterality: Right;  . CARPAL TUNNEL RELEASE Right 04/10/2013   Procedure: REVISION OF CARPAL TUNNEL RELEASE LIGAMENT RIGHT WRIST;  Surgeon: Cammie Sickle., MD;  Location: [place];  Service: Orthopedics;  Laterality: Right;  . CERVICAL FUSION  9/10  . CHOLECYSTECTOMY    . CORONARY ANGIOPLASTY WITH STENT PLACEMENT  [id number]  . CORONARY ARTERY BYPASS GRAFT N/A 12/30/2013   Procedure: CORONARY ARTERY BYPASS GRAFTING (CABG) x three, using left internal mammary artery and right leg greater sapheneous vein harvested endoscopically - LIMA to LAD, SVG-D, SVG-OM1;  Surgeon: Ivin Poot, MD;  Location: [place];  Service: Open Heart Surgery;  Laterality: N/A;  . ELBOW ARTHROSCOPY Right   . LAPAROSCOPIC GASTRIC BANDING  3/14  . LEFT HEART CATHETERIZATION WITH CORONARY ANGIOGRAM N/A 12/26/2013   Procedure: LEFT HEART CATHETERIZATION WITH CORONARY ANGIOGRAM;  Surgeon: Leonie Man, MD;  Location: [facility] CATH LAB;  Service: Cardiovascular;  Laterality: N/A;  . SHOULDER ARTHROSCOPY Right 4/11  . TOTAL KNEE ARTHROPLASTY Right 2006  . TOTAL KNEE ARTHROPLASTY Left 2007  . ULNAR NERVE TRANSPOSITION  06/06/2011   Procedure: ULNAR NERVE DECOMPRESSION/TRANSPOSITION;  Surgeon: Herbie Baltimore  Christena Flake., MD;  Location: [place];  Service: Orthopedics;  Laterality: Right;  decompression ulnar nerve right cubital tunnel     Current Outpatient Prescriptions  Medication Sig Dispense Refill  . aspirin EC 81 MG tablet Take 1 tablet (81 mg total) by mouth daily. Start after stopping eliquis 90 tablet 3  . atorvastatin (LIPITOR) 20 MG tablet TAKE 1 TABLET ONCE DAILY. 30 tablet 6  . cholecalciferol (VITAMIN D) 1000 UNITS tablet Take 1,000 Units  by mouth daily.    . Coenzyme Q10 (CO Q-10) 200 MG CAPS Take 1 capsule by mouth daily.    . fish oil-omega-3 fatty acids 1000 MG capsule Take 1 g by mouth 2 (two) times daily.     . metoprolol (LOPRESSOR) 50 MG tablet Take 50 mg by mouth 2 (two) times daily.    . naproxen (NAPROSYN) 500 MG tablet Take 500 mg by mouth as needed.    . Pomegranate, Punica granatum, (POMEGRANATE PO) Take 435 mg by mouth 2 (two) times daily.     . tadalafil (CIALIS) 20 MG tablet Take 20 mg by mouth daily as needed for erectile dysfunction.     . TURMERIC PO Take 600 mg by mouth 2 (two) times daily.     No current facility-administered medications for this visit.    Allergies:  Chlorhexidine   Social History: The patient  reports that he has never smoked. He quit smokeless tobacco use about 13 years ago. His smokeless tobacco use included Chew. He reports that he drinks about 0.6 oz of alcohol per week . He reports that he does not use drugs.   ROS:  Please see the history of present illness. Otherwise, complete review of systems is positive for none.  All other systems are reviewed and negative.   Physical Exam: VS:  BP 128/80   Pulse (!) 58   Ht 5\' 8"  (1.727 m)   Wt 253 lb 3.2 oz (114.9 kg)   SpO2 97%   BMI 38.50 kg/m , BMI Body mass index is 38.5 kg/m.  Wt Readings from Last 3 Encounters:  07/06/16 253 lb 3.2 oz (114.9 kg)  02/08/15 228 lb (103.4 kg)  10/29/14 233 lb (105.7 kg)    Obese male, appears comfortable at rest.  HEENT: Conjunctiva and lids normal, oropharynx clear.  Neck: Supple, no elevated JVP or carotid bruits, no thyromegaly.  Lungs: Clear to auscultation, nonlabored breathing at rest.  Thorax: Well-healed sternal incision.  Cardiac: Regular rate and rhythm, no S3 or significant systolic murmur, no pericardial rub.  Abdomen: Soft, obese, nontender, bowel sounds present.  Extremities: No edema, distal pulses 2+.  Skin: Warm and dry.  Musculoskeletal: No kyphosis.   Neuropsychiatric: Alert and oriented x3, affect grossly appropriate.  ECG: I personally reviewed the tracing from 02/09/2015 which showed sinus rhythm with poor R-wave progression.  Recent Labwork:    Component Value Date/Time   CHOL 115 02/22/2014 0630   TRIG 63 02/22/2014 0630   HDL 52 02/22/2014 0630   CHOLHDL 2.2 02/22/2014 0630   VLDL 13 02/22/2014 0630   LDLCALC 50 02/22/2014 0630    Other Studies Reviewed Today:  Echocardiogram 02/23/2014: Study Conclusions  - Left ventricle: The cavity size was mildly dilated. Systolic function was normal. The estimated ejection fraction was in the range of 50% to 55%. Wall motion was normal; there were no regional wall motion abnormalities. - Aortic valve: Moderate thickening and calcification, consistent with sclerosis. There was trivial regurgitation. - Aorta:  Aortic root dimension: 38 mm (ED). - Ascending aorta: The ascending aorta was mildly dilated. - Mitral valve: Calcified annulus. - Left atrium: The atrium was mildly dilated. - Tricuspid valve: There was mild regurgitation. - Pulmonic valve: There was mild regurgitation. - Pericardium, extracardiac: A trivial pericardial effusion was identified posterior to the heart.  Exercise Cardiolite 06/03/2012 [facility]): No diagnostic ST segment changes at maximum workload of 10 METs. Inferior soft tissue attenuation noted, no definite ischemia, LVEF 55%.  Assessment and Plan:  1. Multivessel CAD status post CABG in 2015. LVEF 50-55% range. He does not report any angina symptoms. I encouraged him again back to his regular walking regimen for exercise. No changes made to medical therapy.  2. History of atrial flutter in 2015, previously on Eliquis and amiodarone. He has had no obvious breakthrough events. ECG reviewed and stable. CHADSVASC score is 3. He has preferred not to stay on anticoagulation. If he has subsequent events, may consider referral to EP for ablation.  3.  Essential hypertension, blood pressure is adequately controlled today. No changes made to current therapy.  4. Mild aortic root dilatation by history, last assessed in 2015. Asymptomatic.  5. Hyperlipidemia, continues on Lipitor. Keep follow-up with Dr. Nadara Mustard for routine lab work.  Current medicines were reviewed with the patient today.  No orders of the defined types were placed in this encounter.   Disposition: Follow-up in 6 months.  Signed, Satira Sark, MD, [facility] 07/06/2016 3:11 PM    [place] at [place], [place], [place] [address] Phone: [phone]; Fax: [phone]

## 2016-07-06 NOTE — Patient Instructions (Signed)

## 2016-10-17 DIAGNOSIS — Z6837 Body mass index (BMI) 37.0-37.9, adult: Secondary | ICD-10-CM | POA: Diagnosis not present

## 2016-10-17 DIAGNOSIS — L0390 Cellulitis, unspecified: Secondary | ICD-10-CM | POA: Diagnosis not present

## 2016-11-21 DIAGNOSIS — M79672 Pain in left foot: Secondary | ICD-10-CM | POA: Diagnosis not present

## 2016-11-21 DIAGNOSIS — M79671 Pain in right foot: Secondary | ICD-10-CM | POA: Diagnosis not present

## 2016-11-21 DIAGNOSIS — M25579 Pain in unspecified ankle and joints of unspecified foot: Secondary | ICD-10-CM | POA: Diagnosis not present

## 2016-12-08 DIAGNOSIS — M722 Plantar fascial fibromatosis: Secondary | ICD-10-CM | POA: Diagnosis not present

## 2016-12-08 DIAGNOSIS — M79672 Pain in left foot: Secondary | ICD-10-CM | POA: Diagnosis not present

## 2016-12-08 DIAGNOSIS — G609 Hereditary and idiopathic neuropathy, unspecified: Secondary | ICD-10-CM | POA: Diagnosis not present

## 2016-12-08 DIAGNOSIS — M79671 Pain in right foot: Secondary | ICD-10-CM | POA: Diagnosis not present

## 2016-12-11 DIAGNOSIS — Z0001 Encounter for general adult medical examination with abnormal findings: Secondary | ICD-10-CM | POA: Diagnosis not present

## 2016-12-11 DIAGNOSIS — I10 Essential (primary) hypertension: Secondary | ICD-10-CM | POA: Diagnosis not present

## 2016-12-11 DIAGNOSIS — M1990 Unspecified osteoarthritis, unspecified site: Secondary | ICD-10-CM | POA: Diagnosis not present

## 2016-12-11 DIAGNOSIS — E1165 Type 2 diabetes mellitus with hyperglycemia: Secondary | ICD-10-CM | POA: Diagnosis not present

## 2016-12-11 DIAGNOSIS — E291 Testicular hypofunction: Secondary | ICD-10-CM | POA: Diagnosis not present

## 2016-12-11 DIAGNOSIS — G4733 Obstructive sleep apnea (adult) (pediatric): Secondary | ICD-10-CM | POA: Diagnosis not present

## 2016-12-11 DIAGNOSIS — Z6839 Body mass index (BMI) 39.0-39.9, adult: Secondary | ICD-10-CM | POA: Diagnosis not present

## 2016-12-11 DIAGNOSIS — E7800 Pure hypercholesterolemia, unspecified: Secondary | ICD-10-CM | POA: Diagnosis not present

## 2016-12-11 DIAGNOSIS — N401 Enlarged prostate with lower urinary tract symptoms: Secondary | ICD-10-CM | POA: Diagnosis not present

## 2016-12-14 DIAGNOSIS — E7800 Pure hypercholesterolemia, unspecified: Secondary | ICD-10-CM | POA: Diagnosis not present

## 2016-12-14 DIAGNOSIS — Z6838 Body mass index (BMI) 38.0-38.9, adult: Secondary | ICD-10-CM | POA: Diagnosis not present

## 2016-12-14 DIAGNOSIS — E291 Testicular hypofunction: Secondary | ICD-10-CM | POA: Diagnosis not present

## 2016-12-14 DIAGNOSIS — M1990 Unspecified osteoarthritis, unspecified site: Secondary | ICD-10-CM | POA: Diagnosis not present

## 2016-12-14 DIAGNOSIS — G4733 Obstructive sleep apnea (adult) (pediatric): Secondary | ICD-10-CM | POA: Diagnosis not present

## 2016-12-14 DIAGNOSIS — I2510 Atherosclerotic heart disease of native coronary artery without angina pectoris: Secondary | ICD-10-CM | POA: Diagnosis not present

## 2016-12-14 DIAGNOSIS — I10 Essential (primary) hypertension: Secondary | ICD-10-CM | POA: Diagnosis not present

## 2016-12-14 DIAGNOSIS — N401 Enlarged prostate with lower urinary tract symptoms: Secondary | ICD-10-CM | POA: Diagnosis not present

## 2017-01-05 DIAGNOSIS — Z79899 Other long term (current) drug therapy: Secondary | ICD-10-CM | POA: Diagnosis not present

## 2017-01-05 DIAGNOSIS — Z7982 Long term (current) use of aspirin: Secondary | ICD-10-CM | POA: Diagnosis not present

## 2017-01-05 DIAGNOSIS — Z91030 Bee allergy status: Secondary | ICD-10-CM | POA: Diagnosis not present

## 2017-01-05 DIAGNOSIS — Z96659 Presence of unspecified artificial knee joint: Secondary | ICD-10-CM | POA: Diagnosis not present

## 2017-01-05 DIAGNOSIS — Z9049 Acquired absence of other specified parts of digestive tract: Secondary | ICD-10-CM | POA: Diagnosis not present

## 2017-01-05 DIAGNOSIS — Z951 Presence of aortocoronary bypass graft: Secondary | ICD-10-CM | POA: Diagnosis not present

## 2017-01-05 DIAGNOSIS — I10 Essential (primary) hypertension: Secondary | ICD-10-CM | POA: Diagnosis not present

## 2017-01-05 DIAGNOSIS — Z1211 Encounter for screening for malignant neoplasm of colon: Secondary | ICD-10-CM | POA: Diagnosis not present

## 2017-01-05 DIAGNOSIS — I252 Old myocardial infarction: Secondary | ICD-10-CM | POA: Diagnosis not present

## 2017-01-05 DIAGNOSIS — Z981 Arthrodesis status: Secondary | ICD-10-CM | POA: Diagnosis not present

## 2017-01-05 DIAGNOSIS — Z800 Family history of malignant neoplasm of digestive organs: Secondary | ICD-10-CM | POA: Diagnosis not present

## 2017-01-19 DIAGNOSIS — M79673 Pain in unspecified foot: Secondary | ICD-10-CM | POA: Diagnosis not present

## 2017-01-22 ENCOUNTER — Other Ambulatory Visit: Payer: Self-pay

## 2017-03-22 DIAGNOSIS — H40053 Ocular hypertension, bilateral: Secondary | ICD-10-CM | POA: Diagnosis not present

## 2017-04-09 ENCOUNTER — Encounter (HOSPITAL_COMMUNITY): Payer: Self-pay

## 2017-04-12 ENCOUNTER — Ambulatory Visit: Payer: Medicare Other | Admitting: Cardiology

## 2017-05-04 ENCOUNTER — Ambulatory Visit: Payer: Medicare Other | Admitting: Cardiology

## 2017-06-11 ENCOUNTER — Ambulatory Visit: Payer: Medicare Other | Admitting: Cardiology

## 2017-07-16 ENCOUNTER — Ambulatory Visit: Payer: Medicare Other | Admitting: Cardiology

## 2017-07-16 ENCOUNTER — Encounter: Payer: Self-pay | Admitting: Cardiology

## 2017-07-16 ENCOUNTER — Ambulatory Visit (INDEPENDENT_AMBULATORY_CARE_PROVIDER_SITE_OTHER): Payer: Medicare Other | Admitting: Cardiology

## 2017-07-16 VITALS — BP 122/80 | HR 62 | Ht 68.0 in | Wt 238.0 lb

## 2017-07-16 DIAGNOSIS — I2510 Atherosclerotic heart disease of native coronary artery without angina pectoris: Secondary | ICD-10-CM

## 2017-07-16 DIAGNOSIS — E782 Mixed hyperlipidemia: Secondary | ICD-10-CM | POA: Diagnosis not present

## 2017-07-16 DIAGNOSIS — I77810 Thoracic aortic ectasia: Secondary | ICD-10-CM | POA: Diagnosis not present

## 2017-07-16 DIAGNOSIS — I4892 Unspecified atrial flutter: Secondary | ICD-10-CM | POA: Diagnosis not present

## 2017-07-16 NOTE — Patient Instructions (Signed)
Your physician wants you to follow-up in: [place] will receive a reminder letter in the mail two months in advance. If you don't receive a letter, please call our office to schedule the follow-up appointment.  Your physician recommends that you continue on your current medications as directed. Please refer to the Current Medication list given to you today.  Your physician has requested that you have an echocardiogram. Echocardiography is a painless test that uses sound waves to create images of your heart. It provides your doctor with information about the size and shape of your heart and how well your heart's chambers and valves are working. This procedure takes approximately one hour. There are no restrictions for this procedure.  Thank you for choosing [facility]!!

## 2017-07-16 NOTE — Progress Notes (Signed)
Cardiology Office Note  Date: 07/16/2017   ID: ESVIN HNAT, DOB May 17, 1951, MRN [id number]  PCP: Rory Percy, MD  Primary Cardiologist: Rozann Lesches, MD   Chief Complaint  Patient presents with  . Coronary Artery Disease    History of Present Illness: [name] is a 67 y.o. male last seen in January 2018. He presents for a routine follow-up visit. Since last encounter he does not report any angina symptoms on medical therapy. He tries to stay active with outdoor activity.  We went over his medications which are stable from a cardiac perspective and outlined below. He continues to have a physical once a year with Dr. Nadara Mustard. He does not report any other major health changes.  Last echocardiogram was in 2015 as outlined below, LVEF 50-55% and mildly dilated ascending aorta.  I personally reviewed his ECG from today which shows sinus bradycardia with poor anterior R-wave progression  And nonspecific T-wave changes.  Past Medical History:  Diagnosis Date  . Aortic root dilatation (HCC)    41 mm by CT 2008, 36 mm by echo 2011  . Arthritis   . Coronary atherosclerosis of native coronary artery    Multiple stents - RCA/LAD/diagonal, overlapping DES LAD, stent thrombosis 2005, ultimately CABG  . Essential hypertension, benign   . H/O hiatal hernia   . History of GI bleed    On nonsteroidals  . Hyperlipemia   . Myocardial infarction [facility])    Anterior with LAD stent thrombosis 2005  . Obesity   . Paroxysmal atrial flutter ([place])   . Peripheral vascular disease ([place])   . Sleep apnea    CPAP daily    Past Surgical History:  Procedure Laterality Date  . APPENDECTOMY    . BREATH TEK H PYLORI  06/11/2012   Procedure: BREATH TEK H PYLORI;  Surgeon: Shann Medal, MD;  Location: [facility];  Service: General;  Laterality: N/A;  Seth Bake  . CARDIAC SURGERY    . CARPAL TUNNEL RELEASE Right 6/12  . CARPAL TUNNEL RELEASE  06/06/2011   Procedure: CARPAL TUNNEL  RELEASE;  Surgeon: Cammie Sickle., MD;  Location: [place];  Service: Orthopedics;  Laterality: Right;  . CARPAL TUNNEL RELEASE Right 04/10/2013   Procedure: REVISION OF CARPAL TUNNEL RELEASE LIGAMENT RIGHT WRIST;  Surgeon: Cammie Sickle., MD;  Location: [employer];  Service: Orthopedics;  Laterality: Right;  . CERVICAL FUSION  9/10  . CHOLECYSTECTOMY    . CORONARY ANGIOPLASTY WITH STENT PLACEMENT  [id number]  . CORONARY ARTERY BYPASS GRAFT N/A 12/30/2013   Procedure: CORONARY ARTERY BYPASS GRAFTING (CABG) x three, using left internal mammary artery and right leg greater sapheneous vein harvested endoscopically - LIMA to LAD, SVG-D, SVG-OM1;  Surgeon: Ivin Poot, MD;  Location: [place];  Service: Open Heart Surgery;  Laterality: N/A;  . ELBOW ARTHROSCOPY Right   . LAPAROSCOPIC GASTRIC BANDING  3/14  . LEFT HEART CATHETERIZATION WITH CORONARY ANGIOGRAM N/A 12/26/2013   Procedure: LEFT HEART CATHETERIZATION WITH CORONARY ANGIOGRAM;  Surgeon: Leonie Man, MD;  Location: [facility] CATH LAB;  Service: Cardiovascular;  Laterality: N/A;  . SHOULDER ARTHROSCOPY Right 4/11  . TOTAL KNEE ARTHROPLASTY Right 2006  . TOTAL KNEE ARTHROPLASTY Left 2007  . ULNAR NERVE TRANSPOSITION  06/06/2011   Procedure: ULNAR NERVE DECOMPRESSION/TRANSPOSITION;  Surgeon: Cammie Sickle., MD;  Location: [place];  Service: Orthopedics;  Laterality: Right;  decompression ulnar nerve right cubital tunnel  Current Outpatient Medications  Medication Sig Dispense Refill  . aspirin EC 81 MG tablet Take 1 tablet (81 mg total) by mouth daily. Start after stopping eliquis 90 tablet 3  . atorvastatin (LIPITOR) 20 MG tablet TAKE 1 TABLET ONCE DAILY. 30 tablet 6  . cholecalciferol (VITAMIN D) 1000 UNITS tablet Take 1,000 Units by mouth daily.    . Coenzyme Q10 (CO Q-10) 200 MG CAPS Take 1 capsule by mouth daily.    . fish oil-omega-3 fatty acids 1000 MG capsule Take 1 g by  mouth 2 (two) times daily.     . metoprolol (LOPRESSOR) 50 MG tablet Take 50 mg by mouth 2 (two) times daily.    . naproxen (NAPROSYN) 500 MG tablet Take 500 mg by mouth as needed.    . Pomegranate, Punica granatum, (POMEGRANATE PO) Take 435 mg by mouth 2 (two) times daily.     . tadalafil (CIALIS) 20 MG tablet Take 20 mg by mouth daily as needed for erectile dysfunction.     . TURMERIC PO Take 600 mg by mouth 2 (two) times daily.     No current facility-administered medications for this visit.    Allergies:  Chlorhexidine   Social History: The patient  reports that  has never smoked. He quit smokeless tobacco use about 14 years ago. His smokeless tobacco use included chew. He reports that he drinks about 0.6 oz of alcohol per week. He reports that he does not use drugs.   ROS:  Please see the history of present illness. Otherwise, complete review of systems is positive for none.  All other systems are reviewed and negative.   Physical Exam: VS:  BP 122/80   Pulse 62   Ht 5\' 8"  (1.727 m)   Wt 238 lb (108 kg)   SpO2 98%   BMI 36.19 kg/m , BMI Body mass index is 36.19 kg/m.  Wt Readings from Last 3 Encounters:  07/16/17 238 lb (108 kg)  07/06/16 253 lb 3.2 oz (114.9 kg)  02/08/15 228 lb (103.4 kg)    General: Patient appears comfortable at rest. HEENT: Conjunctiva and lids normal, oropharynx clear. Neck: Supple, no elevated JVP or carotid bruits, no thyromegaly. Lungs: Clear to auscultation, nonlabored breathing at rest. Cardiac: Regular rate and rhythm, no S3 or significant systolic murmur, no pericardial rub. Abdomen: Obese, nontender, bowel sounds present, no guarding or rebound. Extremities: No pitting edema, distal pulses 2+. Skin: Warm and dry. Musculoskeletal: No kyphosis. Neuropsychiatric: Alert and oriented x3, affect grossly appropriate.  ECG: I personally reviewed the tracing from 07/06/2016 which showed sinus bradycardia with old anterior infarct pattern and  nonspecific T-wave changes.  Recent Labwork:  August 2016: Hemoglobin 16, platelets 203, potassium 3.9, BUN 13, creatinine 0.85  Other Studies Reviewed Today:  Echocardiogram 02/23/2014: Study Conclusions  - Left ventricle: The cavity size was mildly dilated. Systolic function was normal. The estimated ejection fraction was in the range of 50% to 55%. Wall motion was normal; there were no regional wall motion abnormalities. - Aortic valve: Moderate thickening and calcification, consistent with sclerosis. There was trivial regurgitation. - Aorta: Aortic root dimension: 38 mm (ED). - Ascending aorta: The ascending aorta was mildly dilated. - Mitral valve: Calcified annulus. - Left atrium: The atrium was mildly dilated. - Tricuspid valve: There was mild regurgitation. - Pulmonic valve: There was mild regurgitation. - Pericardium, extracardiac: A trivial pericardial effusion was identified posterior to the heart.  Assessment and Plan:  1. Multivessel CAD status post CABG in  2015. He reports no angina symptoms and his ECG is stable. Continue with observation for now.  2. Mild aortic root dilatation by previous assessment. Follow-up echocardiogram will be obtained which will also provide reassessment of LVEF.  3. History of atrial flutter in 2015 without recurrences. He has preferred to hold off on anticoagulation and antiarrhythmic therapy.  4. Mixed hyperlipidemia, continues on Lipitor with follow-up per Dr. Nadara Mustard.  Current medicines were reviewed with the patient today.   Orders Placed This Encounter  Procedures  . EKG 12-Lead    Disposition: Follow-up in one year, sooner if needed.  Signed, Satira Sark, MD, [facility] 07/16/2017 1:35 PM    [place] [place] at [place], [place], [place] [address] Phone: [phone]; Fax: [phone]

## 2017-07-16 NOTE — Progress Notes (Deleted)
Cardiology Office Note  Date: 07/16/2017   ID: [name], DOB 07/23/1950, MRN [id number]  PCP: Rory Percy, MD  Primary Cardiologist: Rozann Lesches, MD   No chief complaint on file.   History of Present Illness: [name] is a 67 y.o. male last seen in January 2018.  Last echocardiogram was in 2015 as outlined below, LVEF 50-55% and mildly dilated ascending aorta.  Past Medical History:  Diagnosis Date  . Aortic root dilatation (HCC)    41 mm by CT 2008, 36 mm by echo 2011  . Arthritis   . Coronary atherosclerosis of native coronary artery    Multiple stents - RCA/LAD/diagonal, overlapping DES LAD, stent thrombosis 2005, ultimately CABG  . Essential hypertension, benign   . H/O hiatal hernia   . History of GI bleed    On nonsteroidals  . Hyperlipemia   . Myocardial infarction    Anterior with LAD stent thrombosis 2005  . Obesity   . Paroxysmal atrial flutter ([place])   . Peripheral vascular disease ([place])   . Sleep apnea    CPAP daily    Past Surgical History:  Procedure Laterality Date  . APPENDECTOMY    . BREATH TEK H PYLORI  06/11/2012   Procedure: BREATH TEK H PYLORI;  Surgeon: Shann Medal, MD;  Location: [facility];  Service: General;  Laterality: N/A;  Seth Bake  . CARDIAC SURGERY    . CARPAL TUNNEL RELEASE Right 6/12  . CARPAL TUNNEL RELEASE  06/06/2011   Procedure: CARPAL TUNNEL RELEASE;  Surgeon: Cammie Sickle., MD;  Location: [place];  Service: Orthopedics;  Laterality: Right;  . CARPAL TUNNEL RELEASE Right 04/10/2013   Procedure: REVISION OF CARPAL TUNNEL RELEASE LIGAMENT RIGHT WRIST;  Surgeon: Cammie Sickle., MD;  Location: [place];  Service: Orthopedics;  Laterality: Right;  . CERVICAL FUSION  9/10  . CHOLECYSTECTOMY    . CORONARY ANGIOPLASTY WITH STENT PLACEMENT  [id number]  . CORONARY ARTERY BYPASS GRAFT N/A 12/30/2013   Procedure: CORONARY ARTERY BYPASS GRAFTING (CABG) x three, using  left internal mammary artery and right leg greater sapheneous vein harvested endoscopically - LIMA to LAD, SVG-D, SVG-OM1;  Surgeon: Ivin Poot, MD;  Location: [place];  Service: Open Heart Surgery;  Laterality: N/A;  . ELBOW ARTHROSCOPY Right   . LAPAROSCOPIC GASTRIC BANDING  3/14  . LEFT HEART CATHETERIZATION WITH CORONARY ANGIOGRAM N/A 12/26/2013   Procedure: LEFT HEART CATHETERIZATION WITH CORONARY ANGIOGRAM;  Surgeon: Leonie Man, MD;  Location: [facility] CATH LAB;  Service: Cardiovascular;  Laterality: N/A;  . SHOULDER ARTHROSCOPY Right 4/11  . TOTAL KNEE ARTHROPLASTY Right 2006  . TOTAL KNEE ARTHROPLASTY Left 2007  . ULNAR NERVE TRANSPOSITION  06/06/2011   Procedure: ULNAR NERVE DECOMPRESSION/TRANSPOSITION;  Surgeon: Cammie Sickle., MD;  Location: [place];  Service: Orthopedics;  Laterality: Right;  decompression ulnar nerve right cubital tunnel     Current Outpatient Medications  Medication Sig Dispense Refill  . aspirin EC 81 MG tablet Take 1 tablet (81 mg total) by mouth daily. Start after stopping eliquis 90 tablet 3  . atorvastatin (LIPITOR) 20 MG tablet TAKE 1 TABLET ONCE DAILY. 30 tablet 6  . cholecalciferol (VITAMIN D) 1000 UNITS tablet Take 1,000 Units by mouth daily.    . Coenzyme Q10 (CO Q-10) 200 MG CAPS Take 1 capsule by mouth daily.    . fish oil-omega-3 fatty acids 1000 MG capsule Take 1 g  by mouth 2 (two) times daily.     . metoprolol (LOPRESSOR) 50 MG tablet Take 50 mg by mouth 2 (two) times daily.    . naproxen (NAPROSYN) 500 MG tablet Take 500 mg by mouth as needed.    . Pomegranate, Punica granatum, (POMEGRANATE PO) Take 435 mg by mouth 2 (two) times daily.     . tadalafil (CIALIS) 20 MG tablet Take 20 mg by mouth daily as needed for erectile dysfunction.     . TURMERIC PO Take 600 mg by mouth 2 (two) times daily.     No current facility-administered medications for this visit.    Allergies:  Chlorhexidine   Social History: The patient   reports that  has never smoked. He quit smokeless tobacco use about 14 years ago. His smokeless tobacco use included chew. He reports that he drinks about 0.6 oz of alcohol per week. He reports that he does not use drugs.   Family History: The patient's family history includes Colon cancer in his father and maternal grandmother; Diabetes in his maternal grandfather; Obesity in his mother.   ROS:  Please see the history of present illness. Otherwise, complete review of systems is positive for {NONE DEFAULTED:18576::"none"}.  All other systems are reviewed and negative.   Physical Exam: VS:  There were no vitals taken for this visit., BMI There is no height or weight on file to calculate BMI.  Wt Readings from Last 3 Encounters:  07/06/16 253 lb 3.2 oz (114.9 kg)  02/08/15 228 lb (103.4 kg)  10/29/14 233 lb (105.7 kg)    General: Patient appears comfortable at rest. HEENT: Conjunctiva and lids normal, oropharynx clear with moist mucosa. Neck: Supple, no elevated JVP or carotid bruits, no thyromegaly. Lungs: Clear to auscultation, nonlabored breathing at rest. Cardiac: Regular rate and rhythm, no S3 or significant systolic murmur, no pericardial rub. Abdomen: Soft, nontender, no hepatomegaly, bowel sounds present, no guarding or rebound. Extremities: No pitting edema, distal pulses 2+. Skin: Warm and dry. Musculoskeletal: No kyphosis. Neuropsychiatric: Alert and oriented x3, affect grossly appropriate.  ECG: I personally reviewed the tracing from 07/06/2008 which showed sinus bradycardia with old anterior infarct pattern and nonspecific T-wave changes.  Recent Labwork:  August 2016: Hemoglobin 16, platelets 203, potassium 3.9, BUN 13, creatinine 0.85  Other Studies Reviewed Today:  Echocardiogram 02/23/2014: Study Conclusions  - Left ventricle: The cavity size was mildly dilated. Systolic function was normal. The estimated ejection fraction was in the range of 50% to 55%. Wall  motion was normal; there were no regional wall motion abnormalities. - Aortic valve: Moderate thickening and calcification, consistent with sclerosis. There was trivial regurgitation. - Aorta: Aortic root dimension: 38 mm (ED). - Ascending aorta: The ascending aorta was mildly dilated. - Mitral valve: Calcified annulus. - Left atrium: The atrium was mildly dilated. - Tricuspid valve: There was mild regurgitation. - Pulmonic valve: There was mild regurgitation. - Pericardium, extracardiac: A trivial pericardial effusion was identified posterior to the heart.  Assessment and Plan:    Current medicines were reviewed with the patient today.  No orders of the defined types were placed in this encounter.   Disposition:  Signed, Satira Sark, MD, [facility] 07/16/2017 9:09 AM    [place] at [place], [place], [place] [address] Phone: [phone]; Fax: [phone]

## 2017-07-20 DIAGNOSIS — M79644 Pain in right finger(s): Secondary | ICD-10-CM | POA: Diagnosis not present

## 2017-07-20 DIAGNOSIS — G5621 Lesion of ulnar nerve, right upper limb: Secondary | ICD-10-CM | POA: Diagnosis not present

## 2017-07-20 DIAGNOSIS — M65341 Trigger finger, right ring finger: Secondary | ICD-10-CM | POA: Diagnosis not present

## 2017-07-26 ENCOUNTER — Other Ambulatory Visit: Payer: Medicare Other

## 2017-08-02 ENCOUNTER — Ambulatory Visit (INDEPENDENT_AMBULATORY_CARE_PROVIDER_SITE_OTHER): Payer: Medicare Other

## 2017-08-02 ENCOUNTER — Other Ambulatory Visit: Payer: Self-pay

## 2017-08-02 DIAGNOSIS — I2510 Atherosclerotic heart disease of native coronary artery without angina pectoris: Secondary | ICD-10-CM

## 2017-08-03 ENCOUNTER — Telehealth: Payer: Self-pay

## 2017-08-03 NOTE — Telephone Encounter (Signed)
-----   Message from Merlene Laughter, LPN sent at 4/0/3709  7:42 AM EST -----   ----- Message ----- From: Satira Sark, MD Sent: 08/02/2017   4:43 PM To: Merlene Laughter, LPN  Results reviewed.  LVEF remains normal.  Mildly dilated aortic root.  Continue with current follow-up plan. A copy of this test should be forwarded to Rory Percy, MD.

## 2017-08-03 NOTE — Telephone Encounter (Signed)
Patient notified. Routed to PCP 

## 2018-01-22 ENCOUNTER — Other Ambulatory Visit: Payer: Self-pay | Admitting: Student

## 2018-01-22 DIAGNOSIS — Z4651 Encounter for fitting and adjustment of gastric lap band: Secondary | ICD-10-CM | POA: Diagnosis not present

## 2018-01-22 DIAGNOSIS — R1110 Vomiting, unspecified: Secondary | ICD-10-CM

## 2018-01-30 ENCOUNTER — Ambulatory Visit
Admission: RE | Admit: 2018-01-30 | Discharge: 2018-01-30 | Disposition: A | Discharge status: Home / Self Care | Payer: Medicare Other | Source: Ambulatory Visit | Attending: Student | Admitting: Student

## 2018-01-30 ENCOUNTER — Other Ambulatory Visit: Payer: Self-pay | Admitting: Student

## 2018-01-30 DIAGNOSIS — R1110 Vomiting, unspecified: Secondary | ICD-10-CM

## 2018-01-30 DIAGNOSIS — K224 Dyskinesia of esophagus: Secondary | ICD-10-CM | POA: Diagnosis not present

## 2018-02-14 DIAGNOSIS — E291 Testicular hypofunction: Secondary | ICD-10-CM | POA: Diagnosis not present

## 2018-02-14 DIAGNOSIS — G4733 Obstructive sleep apnea (adult) (pediatric): Secondary | ICD-10-CM | POA: Diagnosis not present

## 2018-02-14 DIAGNOSIS — E7800 Pure hypercholesterolemia, unspecified: Secondary | ICD-10-CM | POA: Diagnosis not present

## 2018-02-14 DIAGNOSIS — E1165 Type 2 diabetes mellitus with hyperglycemia: Secondary | ICD-10-CM | POA: Diagnosis not present

## 2018-02-14 DIAGNOSIS — I10 Essential (primary) hypertension: Secondary | ICD-10-CM | POA: Diagnosis not present

## 2018-02-18 DIAGNOSIS — Z6841 Body Mass Index (BMI) 40.0 and over, adult: Secondary | ICD-10-CM | POA: Diagnosis not present

## 2018-02-18 DIAGNOSIS — E7800 Pure hypercholesterolemia, unspecified: Secondary | ICD-10-CM | POA: Diagnosis not present

## 2018-02-18 DIAGNOSIS — E291 Testicular hypofunction: Secondary | ICD-10-CM | POA: Diagnosis not present

## 2018-02-18 DIAGNOSIS — M5432 Sciatica, left side: Secondary | ICD-10-CM | POA: Diagnosis not present

## 2018-02-18 DIAGNOSIS — I739 Peripheral vascular disease, unspecified: Secondary | ICD-10-CM | POA: Diagnosis not present

## 2018-02-18 DIAGNOSIS — I2510 Atherosclerotic heart disease of native coronary artery without angina pectoris: Secondary | ICD-10-CM | POA: Diagnosis not present

## 2018-02-18 DIAGNOSIS — I10 Essential (primary) hypertension: Secondary | ICD-10-CM | POA: Diagnosis not present

## 2018-02-21 DIAGNOSIS — I70213 Atherosclerosis of native arteries of extremities with intermittent claudication, bilateral legs: Secondary | ICD-10-CM | POA: Diagnosis not present

## 2018-02-21 DIAGNOSIS — I70212 Atherosclerosis of native arteries of extremities with intermittent claudication, left leg: Secondary | ICD-10-CM | POA: Diagnosis not present

## 2018-02-22 DIAGNOSIS — M5126 Other intervertebral disc displacement, lumbar region: Secondary | ICD-10-CM | POA: Diagnosis not present

## 2018-02-22 DIAGNOSIS — M5136 Other intervertebral disc degeneration, lumbar region: Secondary | ICD-10-CM | POA: Diagnosis not present

## 2018-02-22 DIAGNOSIS — M47817 Spondylosis without myelopathy or radiculopathy, lumbosacral region: Secondary | ICD-10-CM | POA: Diagnosis not present

## 2018-02-22 DIAGNOSIS — M5134 Other intervertebral disc degeneration, thoracic region: Secondary | ICD-10-CM | POA: Diagnosis not present

## 2018-02-22 DIAGNOSIS — M4807 Spinal stenosis, lumbosacral region: Secondary | ICD-10-CM | POA: Diagnosis not present

## 2018-02-22 DIAGNOSIS — Z9889 Other specified postprocedural states: Secondary | ICD-10-CM | POA: Diagnosis not present

## 2018-02-22 DIAGNOSIS — M48061 Spinal stenosis, lumbar region without neurogenic claudication: Secondary | ICD-10-CM | POA: Diagnosis not present

## 2018-03-26 DIAGNOSIS — Z23 Encounter for immunization: Secondary | ICD-10-CM | POA: Diagnosis not present

## 2018-04-05 DIAGNOSIS — M48062 Spinal stenosis, lumbar region with neurogenic claudication: Secondary | ICD-10-CM | POA: Diagnosis not present

## 2018-04-05 DIAGNOSIS — D1779 Benign lipomatous neoplasm of other sites: Secondary | ICD-10-CM | POA: Diagnosis not present

## 2018-05-07 DIAGNOSIS — Z4651 Encounter for fitting and adjustment of gastric lap band: Secondary | ICD-10-CM | POA: Diagnosis not present

## 2018-05-21 DIAGNOSIS — M48062 Spinal stenosis, lumbar region with neurogenic claudication: Secondary | ICD-10-CM | POA: Diagnosis not present

## 2018-05-21 DIAGNOSIS — D1779 Benign lipomatous neoplasm of other sites: Secondary | ICD-10-CM | POA: Diagnosis not present

## 2018-07-31 ENCOUNTER — Encounter: Payer: Self-pay | Admitting: Cardiology

## 2018-07-31 ENCOUNTER — Encounter: Payer: Self-pay | Admitting: *Deleted

## 2018-07-31 ENCOUNTER — Ambulatory Visit (INDEPENDENT_AMBULATORY_CARE_PROVIDER_SITE_OTHER): Payer: Medicare Other | Admitting: Cardiology

## 2018-07-31 VITALS — BP 138/82 | HR 64 | Ht 68.0 in | Wt 270.4 lb

## 2018-07-31 DIAGNOSIS — I4892 Unspecified atrial flutter: Secondary | ICD-10-CM

## 2018-07-31 DIAGNOSIS — E782 Mixed hyperlipidemia: Secondary | ICD-10-CM | POA: Diagnosis not present

## 2018-07-31 DIAGNOSIS — I77810 Thoracic aortic ectasia: Secondary | ICD-10-CM

## 2018-07-31 DIAGNOSIS — I2510 Atherosclerotic heart disease of native coronary artery without angina pectoris: Secondary | ICD-10-CM

## 2018-07-31 NOTE — Patient Instructions (Signed)

## 2018-07-31 NOTE — Progress Notes (Signed)
Cardiology Office Note  Date: 07/31/2018   ID: [name], DOB [birth year], MRN [id number]  PCP: Rory Percy, MD  Primary Cardiologist: Rozann Lesches, MD   Chief Complaint  Patient presents with  . Coronary Artery Disease    History of Present Illness: [name] is a 68 y.o. male last seen in January 2019.  He is here for a routine visit.  He does not report any angina symptoms or nitroglycerin use.  He describes stable dyspnea on exertion.  He has not been exercising, reports problems with back pain, his weight is up significantly.  He states that he is trying to get back to a regular walking plan.  Follow-up echocardiogram in January of last year revealed normal LVEF at 55 to 60%, moderate diastolic dysfunction, mild aortic regurgitation with sclerotic valve and mildly dilated aortic root.  I personally reviewed his ECG today which shows sinus rhythm with prolonged PR interval and poor R wave progression.  I went over his medications which are outlined below.  He has had no significant changes in cardiac regimen.  Past Medical History:  Diagnosis Date  . Aortic root dilatation (HCC)    41 mm by CT 2008, 36 mm by echo 2011  . Arthritis   . Coronary atherosclerosis of native coronary artery    Multiple stents - RCA/LAD/diagonal, overlapping DES LAD, stent thrombosis 2005, ultimately CABG  . Essential hypertension, benign   . H/O hiatal hernia   . History of GI bleed    On nonsteroidals  . Hyperlipemia   . Myocardial infarction [facility])    Anterior with LAD stent thrombosis 2005  . Obesity   . Paroxysmal atrial flutter ([place])   . Peripheral vascular disease ([place])   . Sleep apnea    CPAP daily    Past Surgical History:  Procedure Laterality Date  . APPENDECTOMY    . BREATH TEK H PYLORI  06/11/2012   Procedure: BREATH TEK H PYLORI;  Surgeon: Shann Medal, MD;  Location: [facility];  Service: General;  Laterality: N/A;  Seth Bake  . CARDIAC SURGERY    .  CARPAL TUNNEL RELEASE Right 6/12  . CARPAL TUNNEL RELEASE  06/06/2011   Procedure: CARPAL TUNNEL RELEASE;  Surgeon: Cammie Sickle., MD;  Location: [place];  Service: Orthopedics;  Laterality: Right;  . CARPAL TUNNEL RELEASE Right 04/10/2013   Procedure: REVISION OF CARPAL TUNNEL RELEASE LIGAMENT RIGHT WRIST;  Surgeon: Cammie Sickle., MD;  Location: [place];  Service: Orthopedics;  Laterality: Right;  . CERVICAL FUSION  9/10  . CHOLECYSTECTOMY    . CORONARY ANGIOPLASTY WITH STENT PLACEMENT  [id number]  . CORONARY ARTERY BYPASS GRAFT N/A 12/30/2013   Procedure: CORONARY ARTERY BYPASS GRAFTING (CABG) x three, using left internal mammary artery and right leg greater sapheneous vein harvested endoscopically - LIMA to LAD, SVG-D, SVG-OM1;  Surgeon: Ivin Poot, MD;  Location: [place];  Service: Open Heart Surgery;  Laterality: N/A;  . ELBOW ARTHROSCOPY Right   . LAPAROSCOPIC GASTRIC BANDING  3/14  . LEFT HEART CATHETERIZATION WITH CORONARY ANGIOGRAM N/A 12/26/2013   Procedure: LEFT HEART CATHETERIZATION WITH CORONARY ANGIOGRAM;  Surgeon: Leonie Man, MD;  Location: [facility] CATH LAB;  Service: Cardiovascular;  Laterality: N/A;  . SHOULDER ARTHROSCOPY Right 4/11  . TOTAL KNEE ARTHROPLASTY Right 2006  . TOTAL KNEE ARTHROPLASTY Left 2007  . ULNAR NERVE TRANSPOSITION  06/06/2011   Procedure: ULNAR NERVE DECOMPRESSION/TRANSPOSITION;  Surgeon: Herbie Baltimore  Christena Flake., MD;  Location: [place];  Service: Orthopedics;  Laterality: Right;  decompression ulnar nerve right cubital tunnel     Current Outpatient Medications  Medication Sig Dispense Refill  . aspirin EC 81 MG tablet Take 1 tablet (81 mg total) by mouth daily. Start after stopping eliquis 90 tablet 3  . atorvastatin (LIPITOR) 20 MG tablet TAKE 1 TABLET ONCE DAILY. 30 tablet 6  . cholecalciferol (VITAMIN D) 1000 UNITS tablet Take 1,000 Units by mouth daily.    . Coenzyme Q10 (CO Q-10) 200 MG  CAPS Take 1 capsule by mouth daily.    . fish oil-omega-3 fatty acids 1000 MG capsule Take 1 g by mouth 2 (two) times daily.     . metoprolol (LOPRESSOR) 50 MG tablet Take 50 mg by mouth 2 (two) times daily.    . naproxen (NAPROSYN) 500 MG tablet Take 500 mg by mouth as needed.    . Pomegranate, Punica granatum, (POMEGRANATE PO) Take 435 mg by mouth 2 (two) times daily.     . Tadalafil (CIALIS) 2.5 MG TABS Take 1 tablet by mouth at bedtime.    . TURMERIC PO Take 600 mg by mouth 2 (two) times daily.     No current facility-administered medications for this visit.    Allergies:  Chlorhexidine   Social History: The patient  reports that he has never smoked. He quit smokeless tobacco use about 15 years ago.  His smokeless tobacco use included chew. He reports current alcohol use of about 1.0 standard drinks of alcohol per week. He reports that he does not use drugs.   ROS:  Please see the history of present illness. Otherwise, complete review of systems is positive for none.  All other systems are reviewed and negative.   Physical Exam: VS:  BP 138/82   Pulse 64   Ht 5\' 8"  (1.727 m)   Wt 270 lb 6.4 oz (122.7 kg)   SpO2 94%   BMI 41.11 kg/m , BMI Body mass index is 41.11 kg/m.  Wt Readings from Last 3 Encounters:  07/31/18 270 lb 6.4 oz (122.7 kg)  07/16/17 238 lb (108 kg)  07/06/16 253 lb 3.2 oz (114.9 kg)    General: Morbidly obese male, appears comfortable at rest. HEENT: Conjunctiva and lids normal, oropharynx clear. Neck: Supple, no elevated JVP or carotid bruits, no thyromegaly. Lungs: Clear to auscultation, nonlabored breathing at rest. Cardiac: Regular rate and rhythm, no S3 or significant systolic murmur. Abdomen: Obese, nontender, bowel sounds present. Extremities: No pitting edema, distal pulses 2+. Skin: Warm and dry. Musculoskeletal: No kyphosis. Neuropsychiatric: Alert and oriented x3, affect grossly appropriate.  ECG: I personally reviewed the tracing from  07/16/2017 which showed sinus bradycardia with poor R wave progression and nonspecific T wave changes.  Recent Labwork:    Component Value Date/Time   CHOL 115 02/22/2014 0630   TRIG 63 02/22/2014 0630   HDL 52 02/22/2014 0630   CHOLHDL 2.2 02/22/2014 0630   VLDL 13 02/22/2014 0630   LDLCALC 50 02/22/2014 0630    Other Studies Reviewed Today:  Echocardiogram 08/02/2017: Study Conclusions  - Left ventricle: The cavity size was normal. Wall thickness was   normal. Systolic function was normal. The estimated ejection   fraction was in the range of 55% to 60%. Wall motion was normal;   there were no regional wall motion abnormalities. Features are   consistent with a pseudonormal left ventricular filling pattern,   with concomitant abnormal relaxation  and increased filling   pressure (grade 2 diastolic dysfunction). Indeterminate filling   pressures. - Aortic valve: Moderately calcified annulus. Mildly thickened,   mildly calcified leaflets. Sclerosis without stenosis. There was   mild regurgitation. - Aorta: Mild aortic root dilatation. - Atrial septum: No defect or patent foramen ovale was identified.   There was an atrial septal aneurysm. - Pulmonic valve: There was mild regurgitation.  Assessment and Plan:  1.  Multivessel CAD status post CABG in 2015.  ECG reviewed and stable.  He reports no active angina on medical therapy and at this point we will continue with observation.  We did discuss possible follow-up stress testing over the next year.  2.  History of atrial flutter in 2015.  No obvious recurrences.  Continue observation.  3.  Mixed hyperlipidemia on Lipitor.  He follows with [facility].  4.  Stable, mild aortic root dilatation, noted by follow-up echocardiogram in January of last year.  He is asymptomatic.  Current medicines were reviewed with the patient today.   Orders Placed This Encounter  Procedures  . EKG 12-Lead    Disposition: Follow-up in 1  year.  Signed, Satira Sark, MD, [facility] 07/31/2018 2:11 PM    [facility] [address], [place], [place] [address] Phone: [phone]; Fax: [phone]

## 2018-10-30 DIAGNOSIS — R109 Unspecified abdominal pain: Secondary | ICD-10-CM | POA: Diagnosis not present

## 2018-10-30 DIAGNOSIS — Z6841 Body Mass Index (BMI) 40.0 and over, adult: Secondary | ICD-10-CM | POA: Diagnosis not present

## 2018-10-30 DIAGNOSIS — N400 Enlarged prostate without lower urinary tract symptoms: Secondary | ICD-10-CM | POA: Diagnosis not present

## 2018-10-30 DIAGNOSIS — N401 Enlarged prostate with lower urinary tract symptoms: Secondary | ICD-10-CM | POA: Diagnosis not present

## 2018-11-07 DIAGNOSIS — Z9049 Acquired absence of other specified parts of digestive tract: Secondary | ICD-10-CM | POA: Diagnosis not present

## 2018-11-07 DIAGNOSIS — Z9884 Bariatric surgery status: Secondary | ICD-10-CM | POA: Diagnosis not present

## 2018-11-07 DIAGNOSIS — R109 Unspecified abdominal pain: Secondary | ICD-10-CM | POA: Diagnosis not present

## 2018-11-07 DIAGNOSIS — I700 Atherosclerosis of aorta: Secondary | ICD-10-CM | POA: Diagnosis not present

## 2018-11-07 DIAGNOSIS — N200 Calculus of kidney: Secondary | ICD-10-CM | POA: Diagnosis not present

## 2018-11-19 ENCOUNTER — Telehealth: Payer: Self-pay | Admitting: Cardiology

## 2018-11-19 NOTE — Telephone Encounter (Signed)
Will make Dr. Domenic Polite aware htat CT is scanned so that he may view and advise [place] on what to tell pt.

## 2018-11-19 NOTE — Telephone Encounter (Signed)
Pt informed. He voiced understanding.

## 2018-11-19 NOTE — Telephone Encounter (Signed)
I reviewed the chart and the scanned report.  It appears of Dr. Nadara Mustard his PCP ordered this.  First of all, I would hope that he has discussed the results of this with Dr. Nadara Mustard.  If the question he has relates to the incidental finding of aortic atherosclerosis, I would not be surprised by seeing this in light of his history of ischemic heart disease.  Importantly, he does not have an aortic aneurysm, and management would be medical therapy which he is already on for his heart disease including aspirin and Lipitor.

## 2018-11-19 NOTE — Telephone Encounter (Signed)
Returned pt call. No answer, left message for pt to return call.  

## 2018-11-19 NOTE — Telephone Encounter (Signed)
Pt called concerning the CT he had done at [facility], it is scanned in under Media. Would like to speak w/ Dr. Domenic Polite about the results.

## 2018-12-27 ENCOUNTER — Ambulatory Visit (INDEPENDENT_AMBULATORY_CARE_PROVIDER_SITE_OTHER): Payer: Medicare Other | Admitting: Urology

## 2018-12-27 DIAGNOSIS — N401 Enlarged prostate with lower urinary tract symptoms: Secondary | ICD-10-CM | POA: Diagnosis not present

## 2018-12-27 DIAGNOSIS — R3915 Urgency of urination: Secondary | ICD-10-CM

## 2019-02-24 DIAGNOSIS — R3915 Urgency of urination: Secondary | ICD-10-CM | POA: Diagnosis not present

## 2019-02-24 DIAGNOSIS — M62838 Other muscle spasm: Secondary | ICD-10-CM | POA: Diagnosis not present

## 2019-02-24 DIAGNOSIS — M6281 Muscle weakness (generalized): Secondary | ICD-10-CM | POA: Diagnosis not present

## 2019-02-28 DIAGNOSIS — Z23 Encounter for immunization: Secondary | ICD-10-CM | POA: Diagnosis not present

## 2019-03-05 DIAGNOSIS — M62838 Other muscle spasm: Secondary | ICD-10-CM | POA: Diagnosis not present

## 2019-03-05 DIAGNOSIS — N3943 Post-void dribbling: Secondary | ICD-10-CM | POA: Diagnosis not present

## 2019-03-05 DIAGNOSIS — R3915 Urgency of urination: Secondary | ICD-10-CM | POA: Diagnosis not present

## 2019-03-05 DIAGNOSIS — M6281 Muscle weakness (generalized): Secondary | ICD-10-CM | POA: Diagnosis not present

## 2019-03-07 DIAGNOSIS — G4733 Obstructive sleep apnea (adult) (pediatric): Secondary | ICD-10-CM | POA: Diagnosis not present

## 2019-03-07 DIAGNOSIS — E1165 Type 2 diabetes mellitus with hyperglycemia: Secondary | ICD-10-CM | POA: Diagnosis not present

## 2019-03-07 DIAGNOSIS — I2510 Atherosclerotic heart disease of native coronary artery without angina pectoris: Secondary | ICD-10-CM | POA: Diagnosis not present

## 2019-03-07 DIAGNOSIS — I10 Essential (primary) hypertension: Secondary | ICD-10-CM | POA: Diagnosis not present

## 2019-03-07 DIAGNOSIS — E291 Testicular hypofunction: Secondary | ICD-10-CM | POA: Diagnosis not present

## 2019-03-07 DIAGNOSIS — E7800 Pure hypercholesterolemia, unspecified: Secondary | ICD-10-CM | POA: Diagnosis not present

## 2019-03-14 DIAGNOSIS — I2510 Atherosclerotic heart disease of native coronary artery without angina pectoris: Secondary | ICD-10-CM | POA: Diagnosis not present

## 2019-03-14 DIAGNOSIS — M4807 Spinal stenosis, lumbosacral region: Secondary | ICD-10-CM | POA: Diagnosis not present

## 2019-03-14 DIAGNOSIS — G4733 Obstructive sleep apnea (adult) (pediatric): Secondary | ICD-10-CM | POA: Diagnosis not present

## 2019-03-14 DIAGNOSIS — N401 Enlarged prostate with lower urinary tract symptoms: Secondary | ICD-10-CM | POA: Diagnosis not present

## 2019-03-14 DIAGNOSIS — M159 Polyosteoarthritis, unspecified: Secondary | ICD-10-CM | POA: Diagnosis not present

## 2019-03-14 DIAGNOSIS — Z6841 Body Mass Index (BMI) 40.0 and over, adult: Secondary | ICD-10-CM | POA: Diagnosis not present

## 2019-05-02 DIAGNOSIS — I2510 Atherosclerotic heart disease of native coronary artery without angina pectoris: Secondary | ICD-10-CM | POA: Diagnosis not present

## 2019-05-02 DIAGNOSIS — E7800 Pure hypercholesterolemia, unspecified: Secondary | ICD-10-CM | POA: Diagnosis not present

## 2019-05-28 ENCOUNTER — Other Ambulatory Visit: Payer: Self-pay

## 2019-06-11 DIAGNOSIS — M545 Low back pain: Secondary | ICD-10-CM | POA: Diagnosis not present

## 2019-06-17 DIAGNOSIS — M545 Low back pain: Secondary | ICD-10-CM | POA: Diagnosis not present

## 2019-06-19 DIAGNOSIS — M545 Low back pain: Secondary | ICD-10-CM | POA: Diagnosis not present

## 2019-06-24 DIAGNOSIS — M545 Low back pain: Secondary | ICD-10-CM | POA: Diagnosis not present

## 2019-07-03 DIAGNOSIS — I2510 Atherosclerotic heart disease of native coronary artery without angina pectoris: Secondary | ICD-10-CM | POA: Diagnosis not present

## 2019-07-03 DIAGNOSIS — N401 Enlarged prostate with lower urinary tract symptoms: Secondary | ICD-10-CM | POA: Diagnosis not present

## 2019-07-10 DIAGNOSIS — Z6841 Body Mass Index (BMI) 40.0 and over, adult: Secondary | ICD-10-CM | POA: Diagnosis not present

## 2019-07-10 DIAGNOSIS — L0390 Cellulitis, unspecified: Secondary | ICD-10-CM | POA: Diagnosis not present

## 2019-07-18 DIAGNOSIS — M545 Low back pain: Secondary | ICD-10-CM | POA: Diagnosis not present

## 2019-07-21 DIAGNOSIS — M545 Low back pain: Secondary | ICD-10-CM | POA: Diagnosis not present

## 2019-07-23 DIAGNOSIS — M545 Low back pain: Secondary | ICD-10-CM | POA: Diagnosis not present

## 2019-07-24 ENCOUNTER — Telehealth: Payer: Self-pay | Admitting: Cardiology

## 2019-07-24 NOTE — Telephone Encounter (Signed)

## 2019-07-28 DIAGNOSIS — Z23 Encounter for immunization: Secondary | ICD-10-CM | POA: Diagnosis not present

## 2019-07-29 DIAGNOSIS — M545 Low back pain: Secondary | ICD-10-CM | POA: Diagnosis not present

## 2019-08-04 ENCOUNTER — Encounter: Payer: Self-pay | Admitting: Cardiology

## 2019-08-04 ENCOUNTER — Telehealth (INDEPENDENT_AMBULATORY_CARE_PROVIDER_SITE_OTHER): Payer: Medicare Other | Admitting: Cardiology

## 2019-08-04 DIAGNOSIS — I25119 Atherosclerotic heart disease of native coronary artery with unspecified angina pectoris: Secondary | ICD-10-CM

## 2019-08-04 DIAGNOSIS — E782 Mixed hyperlipidemia: Secondary | ICD-10-CM

## 2019-08-04 DIAGNOSIS — I77810 Thoracic aortic ectasia: Secondary | ICD-10-CM

## 2019-08-04 DIAGNOSIS — R030 Elevated blood-pressure reading, without diagnosis of hypertension: Secondary | ICD-10-CM

## 2019-08-04 NOTE — Progress Notes (Signed)
Virtual Visit via Telephone Note   This visit type was conducted due to national recommendations for restrictions regarding the COVID-19 Pandemic (e.g. social distancing) in an effort to limit this patient's exposure and mitigate transmission in our community.  Due to his co-morbid illnesses, this patient is at least at moderate risk for complications without adequate follow up.  This format is felt to be most appropriate for this patient at this time.  The patient did not have access to video technology/had technical difficulties with video requiring transitioning to audio format only (telephone).  All issues noted in this document were discussed and addressed.  No physical exam could be performed with this format.  Please refer to the patient's chart for his  consent to telehealth for [facility].   Date:  08/04/2019   ID:  [name], DOB Dec 02, 1950, MRN [id number]  Patient Location: Home Provider Location: Office  PCP:  Rory Percy, MD  Cardiologist:  Rozann Lesches, MD Electrophysiologist:  None   Evaluation Performed:  Follow-Up Visit  Chief Complaint:  Cardiac follow-up  History of Present Illness:    [name] is a 69 y.o. male last seen in January 2020.  We spoke by phone today.  He tells me that he has been doing well overall from a cardiac perspective, no obvious angina symptoms or palpitations.  Blood pressure was elevated today when he checked it, verified this a few times.  He states that he has been compliant with his medications as outlined below.  There have been no significant changes in his cardiac regimen.  He continues to follow with Dr. Nadara Mustard, we are requesting his most recent lab work.  He states that he and his wife both just got the first Covid vaccine.  He has been getting some exercise at home, has a Clinical research associate.  He has been having orthopedic symptoms at are limiting to some degree.  Past Medical History:  Diagnosis Date  . Aortic root  dilatation (HCC)    41 mm by CT 2008, 36 mm by echo 2011  . Arthritis   . Coronary atherosclerosis of native coronary artery    Multiple stents - RCA/LAD/diagonal, overlapping DES LAD, stent thrombosis 2005, ultimately CABG  . Essential hypertension   . H/O hiatal hernia   . History of GI bleed    On nonsteroidals  . Hyperlipemia   . Myocardial infarction [facility])    Anterior with LAD stent thrombosis 2005  . Obesity   . Paroxysmal atrial flutter ([place])   . Peripheral vascular disease ([place])   . Sleep apnea    CPAP daily   Past Surgical History:  Procedure Laterality Date  . APPENDECTOMY    . BREATH TEK H PYLORI  06/11/2012   Procedure: BREATH TEK H PYLORI;  Surgeon: Shann Medal, MD;  Location[facility];  Service: General;  Laterality: N/A;  Seth Bake  . CARDIAC SURGERY    . CARPAL TUNNEL RELEASE Right 6/12  . CARPAL TUNNEL RELEASE  06/06/2011   Procedure: CARPAL TUNNEL RELEASE;  Surgeon: Cammie Sickle., MD;  Location: [place];  Service: Orthopedics;  Laterality: Right;  . CARPAL TUNNEL RELEASE Right 04/10/2013   Procedure: REVISION OF CARPAL TUNNEL RELEASE LIGAMENT RIGHT WRIST;  Surgeon: Cammie Sickle., MD;  Location: [place];  Service: Orthopedics;  Laterality: Right;  . CERVICAL FUSION  9/10  . CHOLECYSTECTOMY    . CORONARY ANGIOPLASTY WITH STENT PLACEMENT  [id number]  .  CORONARY ARTERY BYPASS GRAFT N/A 12/30/2013   Procedure: CORONARY ARTERY BYPASS GRAFTING (CABG) x three, using left internal mammary artery and right leg greater sapheneous vein harvested endoscopically - LIMA to LAD, SVG-D, SVG-OM1;  Surgeon: Ivin Poot, MD;  Location: [place];  Service: Open Heart Surgery;  Laterality: N/A;  . ELBOW ARTHROSCOPY Right   . LAPAROSCOPIC GASTRIC BANDING  3/14  . LEFT HEART CATHETERIZATION WITH CORONARY ANGIOGRAM N/A 12/26/2013   Procedure: LEFT HEART CATHETERIZATION WITH CORONARY ANGIOGRAM;  Surgeon: Leonie Man, MD;  Location: [facility]  CATH LAB;  Service: Cardiovascular;  Laterality: N/A;  . SHOULDER ARTHROSCOPY Right 4/11  . TOTAL KNEE ARTHROPLASTY Right 2006  . TOTAL KNEE ARTHROPLASTY Left 2007  . ULNAR NERVE TRANSPOSITION  06/06/2011   Procedure: ULNAR NERVE DECOMPRESSION/TRANSPOSITION;  Surgeon: Cammie Sickle., MD;  Location: [place];  Service: Orthopedics;  Laterality: Right;  decompression ulnar nerve right cubital tunnel      Current Meds  Medication Sig  . aspirin EC 81 MG tablet Take 1 tablet (81 mg total) by mouth daily. Start after stopping eliquis  . atorvastatin (LIPITOR) 20 MG tablet TAKE 1 TABLET ONCE DAILY.  . cholecalciferol (VITAMIN D) 1000 UNITS tablet Take 1,000 Units by mouth daily.  . Coenzyme Q10 (CO Q-10) 200 MG CAPS Take 1 capsule by mouth daily.  . fish oil-omega-3 fatty acids 1000 MG capsule Take 1 g by mouth 2 (two) times daily.   . metoprolol (LOPRESSOR) 50 MG tablet Take 50 mg by mouth 2 (two) times daily.  . naproxen (NAPROSYN) 500 MG tablet Take 500 mg by mouth as needed.  . Pomegranate, Punica granatum, (POMEGRANATE PO) Take 435 mg by mouth 2 (two) times daily.   . Tadalafil (CIALIS) 2.5 MG TABS Take 1 tablet by mouth at bedtime.  . TURMERIC PO Take 600 mg by mouth 2 (two) times daily.     Allergies:   Chlorhexidine   Social History   Tobacco Use  . Smoking status: Never Smoker  . Smokeless tobacco: Former Systems developer    Types: Chew  Substance Use Topics  . Alcohol use: Yes    Alcohol/week: 1.0 standard drinks    Types: 1 Cans of beer per week    Comment:  sometimes 4 to 5 beers a day - but not every day  . Drug use: No     Family Hx: The patient's family history includes Colon cancer in his father and maternal grandmother; Diabetes in his maternal grandfather; Obesity in his mother.  ROS:   Please see the history of present illness. All other systems reviewed and are negative.   Prior CV studies:   The following studies were reviewed  today:  Echocardiogram 08/02/2017: Study Conclusions   - Left ventricle: The cavity size was normal. Wall thickness was  normal. Systolic function was normal. The estimated ejection  fraction was in the range of 55% to 60%. Wall motion was normal;  there were no regional wall motion abnormalities. Features are  consistent with a pseudonormal left ventricular filling pattern,  with concomitant abnormal relaxation and increased filling  pressure (grade 2 diastolic dysfunction). Indeterminate filling  pressures.  - Aortic valve: Moderately calcified annulus. Mildly thickened,  mildly calcified leaflets. Sclerosis without stenosis. There was  mild regurgitation.  - Aorta: Mild aortic root dilatation.  - Atrial septum: No defect or patent foramen ovale was identified.  There was an atrial septal aneurysm.  - Pulmonic valve: There was mild regurgitation.  Labs/Other Tests and Data Reviewed:    EKG:  An ECG dated 07/31/2018 was personally reviewed today and demonstrated:  Sinus rhythm with prolonged PR interval and poor R wave progression.  Recent Labs:  No interval lab work for review today.  Wt Readings from Last 3 Encounters:  08/04/19 265 lb (120.2 kg)  07/31/18 270 lb 6.4 oz (122.7 kg)  07/16/17 238 lb (108 kg)     Objective:    Vital Signs:  BP (!) 160/85   Ht 5' 8.5" (1.74 m)   Wt 265 lb (120.2 kg)   BMI 39.71 kg/m    Patient spoke in full sentences, not short of breath. No audible wheezing or coughing. Speech pattern normal.  ASSESSMENT & PLAN:    1.  Multivessel CAD status post CABG in 2015.  No reported angina at this time.  Current medications include aspirin, Lipitor, and Lopressor.  We will get an ECG for next office encounter.  2.  Elevated blood pressure.  I asked him to record blood pressure twice a daily for the next few weeks.  He will present for a nurse visit and bring in his home cuff for verification.  3.  Mild aortic root  dilatation, asymptomatic and stable by follow-up echocardiogram in January 2019.  4.  Mixed hyperlipidemia, continues on Lipitor.  Requesting recent lab work from Dr. Nadara Mustard.   Time:   Today, I have spent 9 minutes with the patient with telehealth technology discussing the above problems.     Medication Adjustments/Labs and Tests Ordered: Current medicines are reviewed at length with the patient today.  Concerns regarding medicines are outlined above.   Tests Ordered: No orders of the defined types were placed in this encounter.   Medication Changes: No orders of the defined types were placed in this encounter.   Follow Up:  Nurse visit 2 weeks.  Signed,  Rozann Lesches, MD  08/04/2019 12:05 PM    [facility]

## 2019-08-04 NOTE — Patient Instructions (Addendum)
Medication Instructions:  Continue all current medications.  Labwork: none  Testing/Procedures: none  Follow-Up: To be determined after nurse visit.   Any Other Special Instructions Will Be Listed Below (If Applicable).  Your physician has requested that you regularly monitor and record your blood pressure readings at home. Please take readings twice a day over the next few weeks.  Please bring log with you to your nurse visit for provider review.    Nurse visit in 2 weeks for EKG & blood pressure check.  Please bring your own cuff for comparison.   If you need a refill on your cardiac medications before your next appointment, please call your pharmacy.

## 2019-08-12 DIAGNOSIS — M79672 Pain in left foot: Secondary | ICD-10-CM | POA: Diagnosis not present

## 2019-08-12 DIAGNOSIS — M25572 Pain in left ankle and joints of left foot: Secondary | ICD-10-CM | POA: Diagnosis not present

## 2019-08-12 DIAGNOSIS — M1810 Unilateral primary osteoarthritis of first carpometacarpal joint, unspecified hand: Secondary | ICD-10-CM | POA: Diagnosis not present

## 2019-08-12 DIAGNOSIS — Z6841 Body Mass Index (BMI) 40.0 and over, adult: Secondary | ICD-10-CM | POA: Diagnosis not present

## 2019-08-12 DIAGNOSIS — L0390 Cellulitis, unspecified: Secondary | ICD-10-CM | POA: Diagnosis not present

## 2019-08-12 DIAGNOSIS — M19072 Primary osteoarthritis, left ankle and foot: Secondary | ICD-10-CM | POA: Diagnosis not present

## 2019-08-14 DIAGNOSIS — M25512 Pain in left shoulder: Secondary | ICD-10-CM | POA: Diagnosis not present

## 2019-08-14 DIAGNOSIS — M545 Low back pain: Secondary | ICD-10-CM | POA: Diagnosis not present

## 2019-08-19 ENCOUNTER — Ambulatory Visit (INDEPENDENT_AMBULATORY_CARE_PROVIDER_SITE_OTHER): Payer: Medicare Other | Admitting: *Deleted

## 2019-08-19 ENCOUNTER — Telehealth: Payer: Self-pay | Admitting: *Deleted

## 2019-08-19 ENCOUNTER — Other Ambulatory Visit: Payer: Self-pay

## 2019-08-19 VITALS — BP 151/89 | HR 65 | Ht 68.5 in | Wt 273.0 lb

## 2019-08-19 DIAGNOSIS — I25119 Atherosclerotic heart disease of native coronary artery with unspecified angina pectoris: Secondary | ICD-10-CM | POA: Diagnosis not present

## 2019-08-19 DIAGNOSIS — I10 Essential (primary) hypertension: Secondary | ICD-10-CM

## 2019-08-19 MED ORDER — LOSARTAN POTASSIUM 25 MG PO TABS: 25.0000 mg | ORAL_TABLET | Freq: Every day | ORAL | 3 refills | Status: DC

## 2019-08-19 NOTE — Progress Notes (Signed)
ECG reviewed.  Will go over home blood pressure checks when available.

## 2019-08-19 NOTE — Progress Notes (Signed)
Presents to office for nurse visit to have EKG and BP check. Did not bring home BP monitor for comparison but did bring home BP readings for review. Denies dizziness, chest pain or sob. Has taken all doses of medications as prescribed without side effects. Medications reviewed.

## 2019-08-19 NOTE — Patient Instructions (Addendum)
Medication Instructions:  Continue same medications  Follow-Up: As planned   Blood Pressure Record Sheet To take your blood pressure, you will need a blood pressure machine. You can buy a blood pressure machine (blood pressure monitor) at your clinic, drug store, or online. When choosing one, consider:  An automatic monitor that has an arm cuff.  A cuff that wraps snugly around your upper arm. You should be able to fit only one finger between your arm and the cuff.  A device that stores blood pressure reading results.  Do not choose a monitor that measures your blood pressure from your wrist or finger. Follow your health care provider's instructions for how to take your blood pressure. To use this form:  Get one reading in the morning (a.m.) before you take any medicines.  Get one reading in the evening (p.m.) before supper.  Take at least 2 readings with each blood pressure check. This makes sure the results are correct. Wait 1-2 minutes between measurements.  Write down the results in the spaces on this form.  Repeat this once a week, or as told by your health care provider.  Make a follow-up appointment with your health care provider to discuss the results. Blood pressure log Date: _______________________  a.m. _____________________(1st reading) _____________________(2nd reading)  p.m. _____________________(1st reading) _____________________(2nd reading) Date: _______________________  a.m. _____________________(1st reading) _____________________(2nd reading)  p.m. _____________________(1st reading) _____________________(2nd reading) Date: _______________________  a.m. _____________________(1st reading) _____________________(2nd reading)  p.m. _____________________(1st reading) _____________________(2nd reading) Date: _______________________  a.m. _____________________(1st reading) _____________________(2nd reading)  p.m. _____________________(1st reading)  _____________________(2nd reading) Date: _______________________  a.m. _____________________(1st reading) _____________________(2nd reading)  p.m. _____________________(1st reading) _____________________(2nd reading) This information is not intended to replace advice given to you by your health care provider. Make sure you discuss any questions you have with your health care provider. Document Revised: 08/17/2017 Document Reviewed: 06/19/2017 Elsevier Patient Education  [place].

## 2019-08-19 NOTE — Telephone Encounter (Addendum)
-----   Message from Satira Sark, MD sent at 08/19/2019 10:00 AM EST ----- ECG reviewed.  Will go over home blood pressure checks when available.

## 2019-08-19 NOTE — Telephone Encounter (Signed)
Per Domenic Polite after review of home BP readings, patient to start losartan 25 mg by mouth daily and have BMET done in 10 days. Patient informed and verbalized understanding of plan.

## 2019-08-21 DIAGNOSIS — M67431 Ganglion, right wrist: Secondary | ICD-10-CM | POA: Diagnosis not present

## 2019-08-21 DIAGNOSIS — M25531 Pain in right wrist: Secondary | ICD-10-CM | POA: Diagnosis not present

## 2019-08-22 DIAGNOSIS — M545 Low back pain: Secondary | ICD-10-CM | POA: Diagnosis not present

## 2019-08-22 DIAGNOSIS — M25512 Pain in left shoulder: Secondary | ICD-10-CM | POA: Diagnosis not present

## 2019-08-26 DIAGNOSIS — M25512 Pain in left shoulder: Secondary | ICD-10-CM | POA: Diagnosis not present

## 2019-08-26 DIAGNOSIS — Z23 Encounter for immunization: Secondary | ICD-10-CM | POA: Diagnosis not present

## 2019-08-26 DIAGNOSIS — M545 Low back pain: Secondary | ICD-10-CM | POA: Diagnosis not present

## 2019-08-29 DIAGNOSIS — E7801 Familial hypercholesterolemia: Secondary | ICD-10-CM | POA: Diagnosis not present

## 2019-08-29 DIAGNOSIS — I10 Essential (primary) hypertension: Secondary | ICD-10-CM | POA: Diagnosis not present

## 2019-09-05 ENCOUNTER — Encounter: Payer: Self-pay | Admitting: *Deleted

## 2019-09-09 DIAGNOSIS — H5203 Hypermetropia, bilateral: Secondary | ICD-10-CM | POA: Diagnosis not present

## 2019-09-09 DIAGNOSIS — H25813 Combined forms of age-related cataract, bilateral: Secondary | ICD-10-CM | POA: Diagnosis not present

## 2019-09-09 DIAGNOSIS — H35363 Drusen (degenerative) of macula, bilateral: Secondary | ICD-10-CM | POA: Diagnosis not present

## 2019-09-09 DIAGNOSIS — H524 Presbyopia: Secondary | ICD-10-CM | POA: Diagnosis not present

## 2019-09-09 DIAGNOSIS — H52223 Regular astigmatism, bilateral: Secondary | ICD-10-CM | POA: Diagnosis not present

## 2019-09-15 DIAGNOSIS — I10 Essential (primary) hypertension: Secondary | ICD-10-CM | POA: Diagnosis not present

## 2019-09-16 ENCOUNTER — Telehealth: Payer: Self-pay | Admitting: *Deleted

## 2019-09-16 DIAGNOSIS — G5621 Lesion of ulnar nerve, right upper limb: Secondary | ICD-10-CM | POA: Diagnosis not present

## 2019-09-16 DIAGNOSIS — M67431 Ganglion, right wrist: Secondary | ICD-10-CM | POA: Diagnosis not present

## 2019-09-16 DIAGNOSIS — M67831 Other specified disorders of synovium, right wrist: Secondary | ICD-10-CM | POA: Diagnosis not present

## 2019-09-16 DIAGNOSIS — G8918 Other acute postprocedural pain: Secondary | ICD-10-CM | POA: Diagnosis not present

## 2019-09-16 DIAGNOSIS — M65831 Other synovitis and tenosynovitis, right forearm: Secondary | ICD-10-CM | POA: Diagnosis not present

## 2019-09-16 DIAGNOSIS — D481 Neoplasm of uncertain behavior of connective and other soft tissue: Secondary | ICD-10-CM | POA: Diagnosis not present

## 2019-09-16 NOTE — Telephone Encounter (Signed)
Patient reports that his BP is still elevated after recent addition of losartan 25 mg daily. Patient says he has not had his home BP cuff checked for accuracy. Reports that he has an appointment with his PCP on this Thursday. Advised to take home BP monitor to that visit so that it can be checked for accuracy. Verbalized understanding.

## 2019-09-16 NOTE — Telephone Encounter (Signed)
Patient informed. Copy sent to PCP °

## 2019-09-16 NOTE — Telephone Encounter (Signed)
-----   Message from Satira Sark, MD sent at 09/15/2019  1:10 PM EDT ----- Results reviewed.  Renal function and potassium remain normal after recent medication adjustments.  Continue same.

## 2019-09-18 DIAGNOSIS — I70213 Atherosclerosis of native arteries of extremities with intermittent claudication, bilateral legs: Secondary | ICD-10-CM | POA: Diagnosis not present

## 2019-09-18 DIAGNOSIS — R0600 Dyspnea, unspecified: Secondary | ICD-10-CM | POA: Diagnosis not present

## 2019-09-18 DIAGNOSIS — I10 Essential (primary) hypertension: Secondary | ICD-10-CM | POA: Diagnosis not present

## 2019-09-18 DIAGNOSIS — Z6841 Body Mass Index (BMI) 40.0 and over, adult: Secondary | ICD-10-CM | POA: Diagnosis not present

## 2019-10-01 DIAGNOSIS — R0600 Dyspnea, unspecified: Secondary | ICD-10-CM | POA: Diagnosis not present

## 2019-10-01 DIAGNOSIS — I10 Essential (primary) hypertension: Secondary | ICD-10-CM | POA: Diagnosis not present

## 2019-10-07 NOTE — Progress Notes (Signed)
Cardiology Office Note    Date:  10/08/2019   ID:  [name], DOB 11/21/1950, MRN [id number]  PCP:  Rory Percy, MD  Cardiologist: Rozann Lesches, MD    Chief Complaint  Patient presents with  . Follow-up    Dyspnea on Exertion; Cardiac Clearance    History of Present Illness:    [name] is a 69 y.o. male with past medical history of CAD (s/p prior stenting of LAD and RCA --> cath in 2015 showing LM disease and underwent CABG in 12/2013 with LIMA-LAD, SVG-D1 and SVG-OM1), HTN, HLD, OSA (on CPAP) and dilated aortic root who presents to the office today for pre-operative cardiac clearance.   He most recently had a telehealth visit with Dr. Domenic Polite in 08/2019 and denied any recent chest pain or dyspnea on exertion at that time. BP was elevated at 160/85 and he was encouraged to keep a BP log and a nurse visit was arranged. BP was elevated at 151/89 on the day of his nurse visit and he was ultimately started on Losartan 25 mg daily. Follow-up labs showed stable renal function and electrolytes.  In talking with the patient today, he reports progressive dyspnea on exertion over the past few months. He reports not being as active at baseline given the pandemic and worsening arthritis. He has undergone prior knee replacements and has been informed by Dr. Theda Sers (Orthopedist) he will likely ultimately require bilateral rotator cuff repair. He did receive steroid injections earlier this morning.  He reports getting short of breath with walking a minimal distance such as from the car to the office and down his driveway. He denies any associated chest pain or palpitations. No recent orthopnea, PND or lower extremity edema. He does utilize a CPAP at night.  Was evaluated by his PCP earlier today and Losartan was titrated from 25 mg daily to 50 mg daily. SBP has been variable in the 130's to 170's when checked at home over the past few weeks.   Past Medical History:  Diagnosis  Date  . Aortic root dilatation (HCC)    41 mm by CT 2008, 36 mm by echo 2011  . Arthritis   . Coronary atherosclerosis of native coronary artery    Multiple stents - RCA/LAD/diagonal, overlapping DES LAD, stent thrombosis 2005, ultimately CABG  . Essential hypertension   . H/O hiatal hernia   . History of GI bleed    On nonsteroidals  . Hyperlipemia   . Myocardial infarction [facility])    Anterior with LAD stent thrombosis 2005  . Obesity   . Paroxysmal atrial flutter ([place])   . Peripheral vascular disease ([place])   . Sleep apnea    CPAP daily    Past Surgical History:  Procedure Laterality Date  . APPENDECTOMY    . BREATH TEK H PYLORI  06/11/2012   Procedure: BREATH TEK H PYLORI;  Surgeon: Shann Medal, MD;  Location: [facility];  Service: General;  Laterality: N/A;  Seth Bake  . CARDIAC SURGERY    . CARPAL TUNNEL RELEASE Right 6/12  . CARPAL TUNNEL RELEASE  06/06/2011   Procedure: CARPAL TUNNEL RELEASE;  Surgeon: Cammie Sickle., MD;  Location: [facility];  Service: Orthopedics;  Laterality: Right;  . CARPAL TUNNEL RELEASE Right 04/10/2013   Procedure: REVISION OF CARPAL TUNNEL RELEASE LIGAMENT RIGHT WRIST;  Surgeon: Cammie Sickle., MD;  Location: [facility];  Service: Orthopedics;  Laterality: Right;  . CERVICAL FUSION  9/10  . CHOLECYSTECTOMY    . CORONARY ANGIOPLASTY WITH STENT PLACEMENT  [id number]  . CORONARY ARTERY BYPASS GRAFT N/A 12/30/2013   Procedure: CORONARY ARTERY BYPASS GRAFTING (CABG) x three, using left internal mammary artery and right leg greater sapheneous vein harvested endoscopically - LIMA to LAD, SVG-D, SVG-OM1;  Surgeon: Ivin Poot, MD;  Location: [facility];  Service: Open Heart Surgery;  Laterality: N/A;  . ELBOW ARTHROSCOPY Right   . LAPAROSCOPIC GASTRIC BANDING  3/14  . LEFT HEART CATHETERIZATION WITH CORONARY ANGIOGRAM N/A 12/26/2013   Procedure: LEFT HEART CATHETERIZATION WITH CORONARY ANGIOGRAM;  Surgeon: Leonie Man, MD;  Location: [facility];  Service: Cardiovascular;  Laterality: N/A;  . SHOULDER ARTHROSCOPY Right 4/11  . TOTAL KNEE ARTHROPLASTY Right 2006  . TOTAL KNEE ARTHROPLASTY Left 2007  . ULNAR NERVE TRANSPOSITION  06/06/2011   Procedure: ULNAR NERVE DECOMPRESSION/TRANSPOSITION;  Surgeon: Cammie Sickle., MD;  Location: [facility];  Service: Orthopedics;  Laterality: Right;  decompression ulnar nerve right cubital tunnel     Current Medications: Outpatient Medications Prior to Visit  Medication Sig Dispense Refill  . atorvastatin (LIPITOR) 20 MG tablet TAKE 1 TABLET ONCE DAILY. 30 tablet 6  . cholecalciferol (VITAMIN D) 1000 UNITS tablet Take 1,000 Units by mouth daily.    . Coenzyme Q10 (CO Q-10) 200 MG CAPS Take 1 capsule by mouth daily.    . cyclobenzaprine (FLEXERIL) 10 MG tablet Take 10 mg by mouth 3 (three) times daily.    Marland Kitchen EPINEPHrine 0.3 mg/0.3 mL IJ SOAJ injection Inject 0.3 mLs into the skin as needed.    . fish oil-omega-3 fatty acids 1000 MG capsule Take 1 g by mouth 2 (two) times daily.     Marland Kitchen losartan (COZAAR) 25 MG tablet Take 1 tablet (25 mg total) by mouth daily. 90 tablet 3  . metoprolol (LOPRESSOR) 50 MG tablet Take 50 mg by mouth 2 (two) times daily.    . naproxen (NAPROSYN) 500 MG tablet Take 500 mg by mouth as needed.    . Pomegranate, Punica granatum, (POMEGRANATE PO) Take 435 mg by mouth 2 (two) times daily.     . Tadalafil (CIALIS) 2.5 MG TABS Take 1 tablet by mouth at bedtime.    . TURMERIC PO Take 600 mg by mouth 2 (two) times daily.    Marland Kitchen aspirin 325 MG tablet Take 325 mg by mouth daily.     No facility-administered medications prior to visit.     Allergies:   Chlorhexidine   Social History   Socioeconomic History  . Marital status: Married    Spouse name: Not on file  . Number of children: Not on file  . Years of education: Not on file  . Highest education level: Not on file  Occupational History  . Not on file  Tobacco Use    . Smoking status: Never Smoker  . Smokeless tobacco: Former Systems developer    Types: Chew  Substance and Sexual Activity  . Alcohol use: Yes    Alcohol/week: 1.0 standard drinks    Types: 1 Cans of beer per week    Comment:  sometimes 4 to 5 beers a day - but not every day  . Drug use: No  . Sexual activity: Not on file  Other Topics Concern  . Not on file  Social History Narrative  . Not on file   Social Determinants of Health   Financial Resource Strain:   . Difficulty of Paying  Living Expenses:   Food Insecurity:   . Worried About Charity fundraiser in the Last Year:   . Arboriculturist in the Last Year:   Transportation Needs:   . Film/video editor (Medical):   Marland Kitchen Lack of Transportation (Non-Medical):   Physical Activity:   . Days of Exercise per Week:   . Minutes of Exercise per Session:   Stress:   . Feeling of Stress :   Social Connections:   . Frequency of Communication with Friends and Family:   . Frequency of Social Gatherings with Friends and Family:   . Attends Religious Services:   . Active Member of Clubs or Organizations:   . Attends Archivist Meetings:   Marland Kitchen Marital Status:      Family History:  The patient's family history includes Colon cancer in his father and maternal grandmother; Diabetes in his maternal grandfather; Obesity in his mother.   Review of Systems:   Please see the history of present illness.     General:  No chills, fever, night sweats or weight changes.  Cardiovascular:  No chest pain, edema, orthopnea, palpitations, paroxysmal nocturnal dyspnea. Positive for dyspnea on exertion.  Dermatological: No rash, lesions/masses Respiratory: No cough, dyspnea Urologic: No hematuria, dysuria Abdominal:   No nausea, vomiting, diarrhea, bright red blood per rectum, melena, or hematemesis Neurologic:  No visual changes, wkns, changes in mental status. All other systems reviewed and are otherwise negative except as noted  above.   Physical Exam:    VS:  BP 140/80   Pulse 77   Temp 98.7 F (37.1 C)   Ht 5' 8.5" (1.74 m)   Wt 274 lb (124.3 kg)   SpO2 98%   BMI 41.06 kg/m    General: Well developed, well nourished,male appearing in no acute distress. Head: Normocephalic, atraumatic, sclera non-icteric.  Neck: No carotid bruits. JVD not elevated.  Lungs: Respirations regular and unlabored, without wheezes or rales.  Heart: Regular rate and rhythm. No S3 or S4.  No murmur, no rubs, or gallops appreciated. Abdomen: Soft, non-tender, non-distended. No obvious abdominal masses. Msk:  Strength and tone appear normal for age. No obvious joint deformities or effusions. Extremities: No clubbing or cyanosis. No lower extremity edema.  Distal pedal pulses are 2+ bilaterally. Neuro: Alert and oriented X 3. Moves all extremities spontaneously. No focal deficits noted. Psych:  Responds to questions appropriately with a normal affect. Skin: No rashes or lesions noted  Wt Readings from Last 3 Encounters:  10/08/19 274 lb (124.3 kg)  08/19/19 273 lb (123.8 kg)  08/04/19 265 lb (120.2 kg)     Studies/Labs Reviewed:   EKG:  EKG is not ordered today. EKG from 08/19/2019 is reviewed which shows NSR, HR 67 with 1st degree AV Block. No acute ST abnormalities when compared to prior tracings.   Recent Labs: No results found for requested labs within last 8760 hours.   Lipid Panel    Component Value Date/Time   CHOL 115 02/22/2014 0630   TRIG 63 02/22/2014 0630   HDL 52 02/22/2014 0630   CHOLHDL 2.2 02/22/2014 0630   VLDL 13 02/22/2014 0630   LDLCALC 50 02/22/2014 0630    Additional studies/ records that were reviewed today include:   Echocardiogram: 07/2017 Study Conclusions   - Left ventricle: The cavity size was normal. Wall thickness was  normal. Systolic function was normal. The estimated ejection  fraction was in the range of 55% to 60%. Wall motion  was normal;  there were no regional wall  motion abnormalities. Features are  consistent with a pseudonormal left ventricular filling pattern,  with concomitant abnormal relaxation and increased filling  pressure (grade 2 diastolic dysfunction). Indeterminate filling  pressures.  - Aortic valve: Moderately calcified annulus. Mildly thickened,  mildly calcified leaflets. Sclerosis without stenosis. There was  mild regurgitation.  - Aorta: Mild aortic root dilatation.  - Atrial septum: No defect or patent foramen ovale was identified.  There was an atrial septal aneurysm.  - Pulmonic valve: There was mild regurgitation.   Assessment:    1. Dyspnea on exertion   2. Coronary artery disease involving native coronary artery of native heart with other form of angina pectoris ([place])   3. Essential hypertension   4. Hyperlipidemia LDL goal <70   5. OSA (obstructive sleep apnea)      Plan:   In order of problems listed above:  1. Dyspnea on Exertion - He has developed worsening dyspnea on exertion over the past few months and denies any associated chest pain, palpitations, orthopnea or PND. He did have chest discomfort when he required CABG in 2015 but was experiencing dyspnea as well. - Reviewed options with the patient and will plan to obtain a Lexiscan Myoview for repeat ischemic evaluation. This will help address whether his dyspnea is secondary to deconditioning versus ischemia and will also help to provide cardiac clearance if he does eventually require surgical repair of his rotator cuff in the future.  2. CAD - he is s/p CABG in 12/2013 with LIMA-LAD, SVG-D1 and SVG-OM1. Will plan for repeat ischemic evaluation as outlined above. - Continue ASA (I asked him to reduce dosing from 325mg  daily to 81mg  daily as he is only on this from a cardiac perspective and takes NSAIDS daily given his arthritis), Lopressor 50mg  BID and Atorvastatin 20mg  daily.   3. HTN - BP was improved at 140/80 during his visit today but has  been elevated when checked at home. Losartan was just titrated from 25 mg daily to 50 mg daily by his PCP this morning and I agree with this change. Continue Losartan 50 mg daily and Lopressor 50 mg twice daily.  4. HLD - Followed by PCP.  Will request a copy of his most recent labs. Goal LDL is less than 70 in the setting of known CAD. He remains on Atorvastatin 20 mg daily.  5. OSA - continue with CPAP on a nightly basis.    Medication Adjustments/Labs and Tests Ordered: Current medicines are reviewed at length with the patient today.  Concerns regarding medicines are outlined above.  Medication changes, Labs and Tests ordered today are listed in the Patient Instructions below. Patient Instructions  Medication Instructions:  Your physician has recommended you make the following change in your medication:  Losartan 50 mg Daily  Aspirin 81 mg Daily   *If you need a refill on your cardiac medications before your next appointment, please call your pharmacy*   Lab Work: NONE   If you have labs (blood work) drawn today and your tests are completely normal, you will receive your results only by: Marland Kitchen MyChart Message (if you have MyChart) OR . A paper copy in the mail If you have any lab test that is abnormal or we need to change your treatment, we will call you to review the results.   Testing/Procedures: Your physician has requested that you have a lexiscan myoview. For further information please visit [email or web address]. Please follow instruction sheet,  as given.     Follow-Up: At [facility], you and your health needs are our priority.  As part of our continuing mission to provide you with exceptional heart care, we have created designated Provider Care Teams.  These Care Teams include your primary Cardiologist (physician) and Advanced Practice Providers (APPs -  Physician Assistants and Nurse Practitioners) who all work together to provide you with the care you need, when you need  it.  We recommend signing up for the patient portal called "MyChart".  Sign up information is provided on this After Visit Summary.  MyChart is used to connect with patients for Virtual Visits (Telemedicine).  Patients are able to view lab/test results, encounter notes, upcoming appointments, etc.  Non-urgent messages can be sent to your provider as well.   To learn more about what you can do with MyChart, go to [email or web address].    Your next appointment:   6 month(s)  The format for your next appointment:   In Person  Provider:   Rozann Lesches, MD   Other Instructions Thank you for choosing [facility]!     Signed, Erma Heritage, PA-C  10/08/2019 4:28 PM    [facility] [address] [place], [place] [address] Phone: [phone] Fax: [phone]

## 2019-10-08 ENCOUNTER — Encounter: Payer: Self-pay | Admitting: *Deleted

## 2019-10-08 ENCOUNTER — Encounter: Payer: Self-pay | Admitting: Student

## 2019-10-08 ENCOUNTER — Other Ambulatory Visit: Payer: Self-pay

## 2019-10-08 ENCOUNTER — Ambulatory Visit (INDEPENDENT_AMBULATORY_CARE_PROVIDER_SITE_OTHER): Payer: Medicare Other | Admitting: Student

## 2019-10-08 VITALS — BP 140/80 | HR 77 | Temp 98.7°F | Ht 68.5 in | Wt 274.0 lb

## 2019-10-08 DIAGNOSIS — G4733 Obstructive sleep apnea (adult) (pediatric): Secondary | ICD-10-CM | POA: Diagnosis not present

## 2019-10-08 DIAGNOSIS — I25119 Atherosclerotic heart disease of native coronary artery with unspecified angina pectoris: Secondary | ICD-10-CM | POA: Diagnosis not present

## 2019-10-08 DIAGNOSIS — I10 Essential (primary) hypertension: Secondary | ICD-10-CM

## 2019-10-08 DIAGNOSIS — I25118 Atherosclerotic heart disease of native coronary artery with other forms of angina pectoris: Secondary | ICD-10-CM | POA: Diagnosis not present

## 2019-10-08 DIAGNOSIS — E785 Hyperlipidemia, unspecified: Secondary | ICD-10-CM

## 2019-10-08 DIAGNOSIS — R0600 Dyspnea, unspecified: Secondary | ICD-10-CM

## 2019-10-08 DIAGNOSIS — R0609 Other forms of dyspnea: Secondary | ICD-10-CM

## 2019-10-08 DIAGNOSIS — M25512 Pain in left shoulder: Secondary | ICD-10-CM | POA: Diagnosis not present

## 2019-10-08 DIAGNOSIS — M25511 Pain in right shoulder: Secondary | ICD-10-CM | POA: Diagnosis not present

## 2019-10-08 MED ORDER — ASPIRIN EC 81 MG PO TBEC: 81.0000 mg | DELAYED_RELEASE_TABLET | Freq: Every day | ORAL | 3 refills | Status: DC

## 2019-10-08 NOTE — Patient Instructions (Signed)
Medication Instructions:  Your physician has recommended you make the following change in your medication:  Losartan 50 mg Daily  Aspirin 81 mg Daily   *If you need a refill on your cardiac medications before your next appointment, please call your pharmacy*   Lab Work: NONE   If you have labs (blood work) drawn today and your tests are completely normal, you will receive your results only by: Marland Kitchen MyChart Message (if you have MyChart) OR . A paper copy in the mail If you have any lab test that is abnormal or we need to change your treatment, we will call you to review the results.   Testing/Procedures: Your physician has requested that you have a lexiscan myoview. For further information please visit [email or web address]. Please follow instruction sheet, as given.     Follow-Up: At [facility], you and your health needs are our priority.  As part of our continuing mission to provide you with exceptional heart care, we have created designated Provider Care Teams.  These Care Teams include your primary Cardiologist (physician) and Advanced Practice Providers (APPs -  Physician Assistants and Nurse Practitioners) who all work together to provide you with the care you need, when you need it.  We recommend signing up for the patient portal called "MyChart".  Sign up information is provided on this After Visit Summary.  MyChart is used to connect with patients for Virtual Visits (Telemedicine).  Patients are able to view lab/test results, encounter notes, upcoming appointments, etc.  Non-urgent messages can be sent to your provider as well.   To learn more about what you can do with MyChart, go to [email or web address].    Your next appointment:   6 month(s)  The format for your next appointment:   In Person  Provider:   Rozann Lesches, MD   Other Instructions Thank you for choosing [facility]!

## 2019-10-10 DIAGNOSIS — M25511 Pain in right shoulder: Secondary | ICD-10-CM | POA: Diagnosis not present

## 2019-10-10 DIAGNOSIS — M19212 Secondary osteoarthritis, left shoulder: Secondary | ICD-10-CM | POA: Diagnosis not present

## 2019-10-10 DIAGNOSIS — M25512 Pain in left shoulder: Secondary | ICD-10-CM | POA: Diagnosis not present

## 2019-10-10 DIAGNOSIS — M19011 Primary osteoarthritis, right shoulder: Secondary | ICD-10-CM | POA: Diagnosis not present

## 2019-10-15 DIAGNOSIS — M25511 Pain in right shoulder: Secondary | ICD-10-CM | POA: Diagnosis not present

## 2019-10-15 DIAGNOSIS — M25512 Pain in left shoulder: Secondary | ICD-10-CM | POA: Diagnosis not present

## 2019-10-15 DIAGNOSIS — M19212 Secondary osteoarthritis, left shoulder: Secondary | ICD-10-CM | POA: Diagnosis not present

## 2019-10-15 DIAGNOSIS — M19011 Primary osteoarthritis, right shoulder: Secondary | ICD-10-CM | POA: Diagnosis not present

## 2019-10-16 ENCOUNTER — Ambulatory Visit (HOSPITAL_COMMUNITY)
Admission: RE | Admit: 2019-10-16 | Discharge: 2019-10-16 | Disposition: A | Discharge status: Home / Self Care | Payer: Medicare Other | Source: Ambulatory Visit | Attending: Student | Admitting: Student

## 2019-10-16 ENCOUNTER — Other Ambulatory Visit: Payer: Self-pay

## 2019-10-16 ENCOUNTER — Ambulatory Visit (HOSPITAL_BASED_OUTPATIENT_CLINIC_OR_DEPARTMENT_OTHER)
Admission: RE | Admit: 2019-10-16 | Discharge: 2019-10-16 | Disposition: A | Discharge status: Home / Self Care | Payer: Medicare Other | Source: Ambulatory Visit | Attending: Student | Admitting: Student

## 2019-10-16 ENCOUNTER — Encounter (HOSPITAL_COMMUNITY): Payer: Self-pay

## 2019-10-16 DIAGNOSIS — I25118 Atherosclerotic heart disease of native coronary artery with other forms of angina pectoris: Secondary | ICD-10-CM | POA: Insufficient documentation

## 2019-10-16 DIAGNOSIS — R0600 Dyspnea, unspecified: Secondary | ICD-10-CM | POA: Diagnosis not present

## 2019-10-16 DIAGNOSIS — R0609 Other forms of dyspnea: Secondary | ICD-10-CM | POA: Diagnosis not present

## 2019-10-16 DIAGNOSIS — R9439 Abnormal result of other cardiovascular function study: Secondary | ICD-10-CM | POA: Insufficient documentation

## 2019-10-16 LAB — NM MYOCAR MULTI W/SPECT W/WALL MOTION / EF
LV dias vol: 166 mL (ref 62–150)
LV sys vol: 71 mL
Peak HR: 68 {beats}/min
RATE: 0.37
Rest HR: 55 {beats}/min
SDS: 0
SRS: 3
SSS: 3
TID: 1.01

## 2019-10-16 MED ORDER — REGADENOSON 0.4 MG/5ML IV SOLN
INTRAVENOUS | Status: AC
  Administered 2019-10-16: 11:00:00 0.4 mg via INTRAVENOUS
  Filled 2019-10-16: qty 5

## 2019-10-16 MED ORDER — SODIUM CHLORIDE FLUSH 0.9 % IV SOLN
INTRAVENOUS | Status: AC
  Administered 2019-10-16: 11:00:00 10 mL via INTRAVENOUS
  Filled 2019-10-16: qty 10

## 2019-10-16 MED ORDER — TECHNETIUM TC 99M TETROFOSMIN IV KIT
10.0000 | PACK | Freq: Once | INTRAVENOUS | Status: AC | PRN
  Administered 2019-10-16: 10:00:00 9.87 via INTRAVENOUS

## 2019-10-16 MED ORDER — TECHNETIUM TC 99M TETROFOSMIN IV KIT
30.0000 | PACK | Freq: Once | INTRAVENOUS | Status: AC | PRN
  Administered 2019-10-16: 11:00:00 29 via INTRAVENOUS

## 2019-10-17 DIAGNOSIS — M25512 Pain in left shoulder: Secondary | ICD-10-CM | POA: Diagnosis not present

## 2019-10-17 DIAGNOSIS — M19011 Primary osteoarthritis, right shoulder: Secondary | ICD-10-CM | POA: Diagnosis not present

## 2019-10-17 DIAGNOSIS — M25511 Pain in right shoulder: Secondary | ICD-10-CM | POA: Diagnosis not present

## 2019-10-17 DIAGNOSIS — M19212 Secondary osteoarthritis, left shoulder: Secondary | ICD-10-CM | POA: Diagnosis not present

## 2019-10-31 DIAGNOSIS — I10 Essential (primary) hypertension: Secondary | ICD-10-CM | POA: Diagnosis not present

## 2019-10-31 DIAGNOSIS — E7801 Familial hypercholesterolemia: Secondary | ICD-10-CM | POA: Diagnosis not present

## 2019-11-19 DIAGNOSIS — M19012 Primary osteoarthritis, left shoulder: Secondary | ICD-10-CM | POA: Diagnosis not present

## 2019-11-19 DIAGNOSIS — M25811 Other specified joint disorders, right shoulder: Secondary | ICD-10-CM | POA: Diagnosis not present

## 2019-11-26 DIAGNOSIS — M25811 Other specified joint disorders, right shoulder: Secondary | ICD-10-CM | POA: Diagnosis not present

## 2019-11-28 DIAGNOSIS — R936 Abnormal findings on diagnostic imaging of limbs: Secondary | ICD-10-CM | POA: Diagnosis not present

## 2019-11-28 DIAGNOSIS — M6258 Muscle wasting and atrophy, not elsewhere classified, other site: Secondary | ICD-10-CM | POA: Diagnosis not present

## 2019-11-28 DIAGNOSIS — M19011 Primary osteoarthritis, right shoulder: Secondary | ICD-10-CM | POA: Diagnosis not present

## 2019-12-01 DIAGNOSIS — I2510 Atherosclerotic heart disease of native coronary artery without angina pectoris: Secondary | ICD-10-CM | POA: Diagnosis not present

## 2019-12-01 DIAGNOSIS — M1990 Unspecified osteoarthritis, unspecified site: Secondary | ICD-10-CM | POA: Diagnosis not present

## 2019-12-01 DIAGNOSIS — I10 Essential (primary) hypertension: Secondary | ICD-10-CM | POA: Diagnosis not present

## 2019-12-10 ENCOUNTER — Telehealth: Payer: Self-pay | Admitting: Cardiology

## 2019-12-10 NOTE — Telephone Encounter (Signed)
Patient called stating that he was looking for a surgical clearance from Dr. Veverly Fells office. Told patient that we have not received it yet.  Dr. Veverly Fells # [phone].

## 2019-12-12 ENCOUNTER — Telehealth: Payer: Self-pay | Admitting: *Deleted

## 2019-12-12 NOTE — Telephone Encounter (Signed)
   Primary Cardiologist: Rozann Lesches, MD  Chart reviewed as part of pre-operative protocol coverage. Given past medical history and time since last visit, based on ACC/AHA guidelines, [name] would be at acceptable risk for the planned procedure without further cardiovascular testing.   His RCRI is a class II risk, 0.9% risk of major cardiac event.  I will route this recommendation to the requesting party via Epic fax function and remove from pre-op pool.  Please call with questions.  Jossie Ng. Torrie Namba NP-C    12/12/2019, 7:48 AM [place] [address] Office [phone] Fax [phone]

## 2019-12-12 NOTE — Telephone Encounter (Signed)
   [facility] Pre-operative Risk Assessment    HEARTCARE STAFF: - Please ensure there is not already an duplicate clearance open for this procedure. - Under Visit Info/Reason for Call, type in Other and utilize the format Clearance MM/DD/YY or Clearance TBD. Do not use dashes or single digits. - If request is for dental extraction, please clarify the # of teeth to be extracted.  Request for surgical clearance:  1. What type of surgery is being performed? Right reverse total shoulder  2. When is this surgery scheduled? 01/30/2020  3. What type of clearance is required (medical clearance vs. Pharmacy clearance to hold med vs. Both)? Medical  4. Are there any medications that need to be held prior to surgery and how long? No  5. Practice name and name of physician performing surgery? [facility] Dr. Esmond Plants  6. What is the office phone number? [phone]   7.   What is the office fax number? [phone]  8.   Anesthesia type (None, local, MAC, general) ? General   Marlou Sa 12/12/2019, 7:22 AM  _________________________________________________________________   (provider comments below)

## 2020-01-14 NOTE — H&P (Signed)
Patient's anticipated LOS is less than 2 midnights, meeting these requirements: - Younger than 35 - Lives within 1 hour of care - Has a competent adult at home to recover with post-op recover - NO history of  - Chronic pain requiring opiods  - Diabetes  - Coronary Artery Disease  - Heart failure  - Heart attack  - Stroke  - DVT/VTE  - Cardiac arrhythmia  - Respiratory Failure/COPD  - Renal failure  - Anemia  - Advanced Liver disease       [name] is an 69 y.o. male.    Chief Complaint: right shoulder pain  HPI: Pt is a 69 y.o. male complaining of right shoulder pain for multiple years. Pain had continually increased since the beginning. X-rays in the clinic show end-stage arthritic changes of the right shoulder. Pt has tried various conservative treatments which have failed to alleviate their symptoms, including injections and therapy. Various options are discussed with the patient. Risks, benefits and expectations were discussed with the patient. Patient understand the risks, benefits and expectations and wishes to proceed with surgery.   PCP:  Rory Percy, MD  D/C Plans: Home  PMH: Past Medical History:  Diagnosis Date  . Aortic root dilatation (HCC)    41 mm by CT 2008, 36 mm by echo 2011  . Arthritis   . Coronary atherosclerosis of native coronary artery    Multiple stents - RCA/LAD/diagonal, overlapping DES LAD, stent thrombosis 2005, ultimately CABG  . Essential hypertension   . H/O hiatal hernia   . History of GI bleed    On nonsteroidals  . Hyperlipemia   . Myocardial infarction [facility])    Anterior with LAD stent thrombosis 2005  . Obesity   . Paroxysmal atrial flutter ([place])   . Peripheral vascular disease ([place])   . Sleep apnea    CPAP daily    PSH: Past Surgical History:  Procedure Laterality Date  . APPENDECTOMY    . BREATH TEK H PYLORI  06/11/2012   Procedure: BREATH TEK H PYLORI;  Surgeon: Shann Medal, MD;  Location: [facility];   Service: General;  Laterality: N/A;  Seth Bake  . CARDIAC SURGERY    . CARPAL TUNNEL RELEASE Right 6/12  . CARPAL TUNNEL RELEASE  06/06/2011   Procedure: CARPAL TUNNEL RELEASE;  Surgeon: Cammie Sickle., MD;  Location: [place];  Service: Orthopedics;  Laterality: Right;  . CARPAL TUNNEL RELEASE Right 04/10/2013   Procedure: REVISION OF CARPAL TUNNEL RELEASE LIGAMENT RIGHT WRIST;  Surgeon: Cammie Sickle., MD;  Location: [place];  Service: Orthopedics;  Laterality: Right;  . CERVICAL FUSION  9/10  . CHOLECYSTECTOMY    . CORONARY ANGIOPLASTY WITH STENT PLACEMENT  [id number]  . CORONARY ARTERY BYPASS GRAFT N/A 12/30/2013   Procedure: CORONARY ARTERY BYPASS GRAFTING (CABG) x three, using left internal mammary artery and right leg greater sapheneous vein harvested endoscopically - LIMA to LAD, SVG-D, SVG-OM1;  Surgeon: Ivin Poot, MD;  Location: [facility];  Service: Open Heart Surgery;  Laterality: N/A;  . ELBOW ARTHROSCOPY Right   . LAPAROSCOPIC GASTRIC BANDING  3/14  . LEFT HEART CATHETERIZATION WITH CORONARY ANGIOGRAM N/A 12/26/2013   Procedure: LEFT HEART CATHETERIZATION WITH CORONARY ANGIOGRAM;  Surgeon: Leonie Man, MD;  Location: [facility] CATH LAB;  Service: Cardiovascular;  Laterality: N/A;  . SHOULDER ARTHROSCOPY Right 4/11  . TOTAL KNEE ARTHROPLASTY Right 2006  . TOTAL KNEE ARTHROPLASTY Left 2007  . ULNAR  NERVE TRANSPOSITION  06/06/2011   Procedure: ULNAR NERVE DECOMPRESSION/TRANSPOSITION;  Surgeon: Cammie Sickle., MD;  Location: [facility];  Service: Orthopedics;  Laterality: Right;  decompression ulnar nerve right cubital tunnel     Social History:  reports that he has never smoked. He quit smokeless tobacco use about 16 years ago.  His smokeless tobacco use included chew. He reports current alcohol use of about 1.0 standard drink of alcohol per week. He reports that he does not use drugs.  Allergies:  Allergies  Allergen  Reactions  . Chlorhexidine Itching    Pt. States on last admission CHG wipes made him itch and it had to be washed off.  Uncertain if benadryl was given.    Medications: No current facility-administered medications for this encounter.   Current Outpatient Medications  Medication Sig Dispense Refill  . aspirin 325 MG tablet Take 325 mg by mouth at bedtime.    Marland Kitchen atorvastatin (LIPITOR) 20 MG tablet TAKE 1 TABLET ONCE DAILY. (Patient taking differently: Take 20 mg by mouth every evening. ) 30 tablet 6  . b complex vitamins tablet Take 1 tablet by mouth daily.    . Cholecalciferol (VITAMIN D) 125 MCG (5000 UT) CAPS Take 5,000 Units by mouth daily.     . Coenzyme Q10 (CO Q-10) 100 MG CAPS Take 100 mg by mouth daily.     . cyclobenzaprine (FLEXERIL) 10 MG tablet Take 10 mg by mouth daily as needed for muscle spasms.     Marland Kitchen EPINEPHrine 0.3 mg/0.3 mL IJ SOAJ injection Inject 0.3 mLs into the skin as needed for anaphylaxis.     . Glucosamine-Chondroitin (OSTEO BI-FLEX REGULAR STRENGTH PO) Take 2 tablets by mouth daily.    Marland Kitchen losartan (COZAAR) 100 MG tablet Take 100 mg by mouth daily.    . metoprolol (LOPRESSOR) 50 MG tablet Take 50 mg by mouth 2 (two) times daily.    . Multiple Vitamins-Minerals (EYE VITAMINS PO) Take 1 capsule by mouth daily.    . naproxen (NAPROSYN) 500 MG tablet Take 500 mg by mouth 2 (two) times daily with a meal.     . OLIVE LEAF EXTRACT PO Take 750 mg by mouth daily.    . Omega-3 Fatty Acids (OMEGA 3 500) 500 MG CAPS Take 500 mg by mouth daily.    . Pomegranate, [place] gran[place], (POMEGRANATE PO) Take 2 capsules by mouth daily.     . Tadalafil (CIALIS) 2.5 MG TABS Take 2.5 mg by mouth at bedtime.     . Turmeric 500 MG TABS Take 500 mg by mouth 2 (two) times daily.     Marland Kitchen aspirin EC 81 MG tablet Take 1 tablet (81 mg total) by mouth daily. (Patient not taking: Reported on 01/08/2020) 90 tablet 3  . fish oil-omega-3 fatty acids 1000 MG capsule Take 1 g by mouth 2 (two) times daily.   (Patient not taking: Reported on 01/08/2020)    . losartan (COZAAR) 25 MG tablet Take 1 tablet (25 mg total) by mouth daily. (Patient not taking: Reported on 01/08/2020) 90 tablet 3    No results found for this or any previous visit (from the past 48 hour(s)). No results found.  ROS: Pain with rom of the right upper extremity  Physical Exam: Alert and oriented 69 y.o. male in no acute distress Cranial nerves 2-12 intact Cervical spine: full rom with no tenderness, nv intact distally Chest: active breath sounds bilaterally, no wheeze rhonchi or rales Heart: regular rate and  rhythm, no murmur Abd: non tender non distended with active bowel sounds Hip is stable with rom  Right shoulder with weak and painful rom nv intact distally No rashes or edema distally  Assessment/Plan Assessment: right shoulder cuff arthropathy  Plan:  Patient will undergo a right reverse total shoulder by Dr. Veverly Fells at [facility] benefits and expectations were discussed with the patient. Patient understand risks, benefits and expectations and wishes to proceed. Preoperative templating of the joint replacement has been completed, documented, and submitted to the Operating Room personnel in order to optimize intra-operative equipment management.   Merla Riches PA-C, MPAS [facility] is now [employer] [address], [place], [place], [place] [address] Phone: [phone] [email or web address] Facebook  [employer]

## 2020-01-21 NOTE — Patient Instructions (Addendum)
DUE TO COVID-19 ONLY ONE VISITOR IS ALLOWED TO COME WITH YOU AND STAY IN THE WAITING ROOM ONLY DURING PRE OP AND PROCEDURE DAY OF SURGERY. THE 1 VISITOR MAY VISIT WITH YOU AFTER SURGERY IN YOUR PRIVATE ROOM DURING VISITING HOURS ONLY!  YOU NEED TO HAVE A COVID 19 TEST ON: 01/27/20 @ 11:00 am , THIS TEST MUST BE DONE BEFORE SURGERY, COME  [place], [place] , [address].  ([place]) ONCE YOUR COVID TEST IS COMPLETED, PLEASE BEGIN THE QUARANTINE INSTRUCTIONS AS OUTLINED IN YOUR HANDOUT.                [name]     Your procedure is scheduled on: 01/30/20   Report to [facility]  Entrance   Report to short stay at: 5:30 AM     Call this number if you have problems the morning of surgery [phone]    Remember:   NO SOLID FOOD AFTER MIDNIGHT THE NIGHT PRIOR TO SURGERY. NOTHING BY MOUTH EXCEPT CLEAR LIQUIDS UNTIL: 4:30 am . PLEASE FINISH ENSURE DRINK PER SURGEON ORDER  WHICH NEEDS TO BE COMPLETED AT: 4:30 am .   CLEAR LIQUID DIET   Foods Allowed                                                                     Foods Excluded  Coffee and tea, regular and decaf                             liquids that you cannot  Plain Jell-O any favor except red or purple                                           see through such as: Fruit ices (not with fruit pulp)                                     milk, soups, orange juice  Iced Popsicles                                    All solid food Carbonated beverages, regular and diet                                    Cranberry, grape and apple juices Sports drinks like Gatorade Lightly seasoned clear broth or consume(fat free) Sugar, honey syrup  Sample Menu Breakfast                                Lunch                                     Supper Cranberry juice  Beef broth                            Chicken broth Jell-O                                     Grape juice                            Apple juice Coffee or tea                        Jell-O                                      Popsicle                                                Coffee or tea                        Coffee or tea  _____________________________________________________________________   BRUSH YOUR TEETH MORNING OF SURGERY AND RINSE YOUR MOUTH OUT, NO CHEWING GUM CANDY OR MINTS.     Take these medicines the morning of surgery with A SIP OF WATER: metoprolol.                                 You may not have any metal on your body including hair pins and              piercings  Do not wear jewelry, lotions, powders or perfumes, deodorant             Men may shave face and neck.   Do not bring valuables to the hospital. [place].  Contacts, dentures or bridgework may not be worn into surgery.  Leave suitcase in the car. After surgery it may be brought to your room.     Patients discharged the day of surgery will not be allowed to drive home. IF YOU ARE HAVING SURGERY AND GOING HOME THE SAME DAY, YOU MUST HAVE AN ADULT TO DRIVE YOU HOME AND BE WITH YOU FOR 24 HOURS. YOU MAY GO HOME BY TAXI OR UBER OR ORTHERWISE, BUT AN ADULT MUST ACCOMPANY YOU HOME AND STAY WITH YOU FOR 24 HOURS.  Name and phone number of your driver:  Special Instructions: N/A              Please read over the following fact sheets you were given: _____________________________________________________________________   PLEASE BRING CPAP MASK [place]. DEVICE WILL BE PROVIDED!PLEASE BRING CPAP MASK AND  TUBING ONLY. DEVICE WILL BE PROVIDED!       [facility] - Preparing for Surgery Before surgery, you can play an important role.  Because skin is not sterile, your skin needs to be as free of germs as possible.  You can reduce the number of germs on your skin by washing  with CHG (chlorahexidine gluconate) soap before surgery.  CHG is an antiseptic cleaner which kills germs  and bonds with the skin to continue killing germs even after washing. Please DO NOT use if you have an allergy to CHG or antibacterial soaps.  If your skin becomes reddened/irritated stop using the CHG and inform your nurse when you arrive at Short Stay. Do not shave (including legs and underarms) for at least 48 hours prior to the first CHG shower.  You may shave your face/neck. Please follow these instructions carefully:  1.  Shower with CHG Soap the night before surgery and the  morning of Surgery.  2.  If you choose to wash your hair, wash your hair first as usual with your  normal  shampoo.  3.  After you shampoo, rinse your hair and body thoroughly to remove the  shampoo.                           4.  Use CHG as you would any other liquid soap.  You can apply chg directly  to the skin and wash                       Gently with a scrungie or clean washcloth.  5.  Apply the CHG Soap to your body ONLY FROM THE NECK DOWN.   Do not use on face/ open                           Wound or open sores. Avoid contact with eyes, ears mouth and genitals (private parts).                       Wash face,  Genitals (private parts) with your normal soap.             6.  Wash thoroughly, paying special attention to the area where your surgery  will be performed.  7.  Thoroughly rinse your body with warm water from the neck down.  8.  DO NOT shower/wash with your normal soap after using and rinsing off  the CHG Soap.                9.  Pat yourself dry with a clean towel.            10.  Wear clean pajamas.            11.  Place clean sheets on your bed the night of your first shower and do not  sleep with pets. Day of Surgery : Do not apply any lotions/deodorants the morning of surgery.  Please wear clean clothes to the hospital/surgery center.  FAILURE TO FOLLOW THESE INSTRUCTIONS MAY RESULT IN THE CANCELLATION OF YOUR SURGERY PATIENT SIGNATURE_________________________________  NURSE  SIGNATURE__________________________________  ________________________________________________________________________   [name]  An incentive spirometer is a tool that can help keep your lungs clear and active. This tool measures how well you are filling your lungs with each breath. Taking long deep breaths may help reverse or decrease the chance of developing breathing (pulmonary) problems (especially infection) following:  A long period of time when you are unable to move or be active. BEFORE THE PROCEDURE   If the spirometer includes an indicator to show your best effort, your nurse or respiratory therapist will set it to a desired goal.  If possible, sit up straight  or lean slightly forward. Try not to slouch.  Hold the incentive spirometer in an upright position. INSTRUCTIONS FOR USE  1. Sit on the edge of your bed if possible, or sit up as far as you can in bed or on a chair. 2. Hold the incentive spirometer in an upright position. 3. Breathe out normally. 4. Place the mouthpiece in your mouth and seal your lips tightly around it. 5. Breathe in slowly and as deeply as possible, raising the piston or the ball toward the top of the column. 6. Hold your breath for 3-5 seconds or for as long as possible. Allow the piston or ball to fall to the bottom of the column. 7. Remove the mouthpiece from your mouth and breathe out normally. 8. Rest for a few seconds and repeat Steps 1 through 7 at least 10 times every 1-2 hours when you are awake. Take your time and take a few normal breaths between deep breaths. 9. The spirometer may include an indicator to show your best effort. Use the indicator as a goal to work toward during each repetition. 10. After each set of 10 deep breaths, practice coughing to be sure your lungs are clear. If you have an incision (the cut made at the time of surgery), support your incision when coughing by placing a pillow or rolled up towels firmly  against it. Once you are able to get out of bed, walk around indoors and cough well. You may stop using the incentive spirometer when instructed by your caregiver.  RISKS AND COMPLICATIONS  Take your time so you do not get dizzy or light-headed.  If you are in pain, you may need to take or ask for pain medication before doing incentive spirometry. It is harder to take a deep breath if you are having pain. AFTER USE  Rest and breathe slowly and easily.  It can be helpful to keep track of a log of your progress. Your caregiver can provide you with a simple table to help with this. If you are using the spirometer at home, follow these instructions: [place] IF:   You are having difficultly using the spirometer.  You have trouble using the spirometer as often as instructed.  Your pain medication is not giving enough relief while using the spirometer.  You develop fever of 100.5 F (38.1 C) or higher. SEEK IMMEDIATE MEDICAL CARE IF:   You cough up bloody sputum that had not been present before.  You develop fever of 102 F (38.9 C) or greater.  You develop worsening pain at or near the incision site. MAKE SURE YOU:   Understand these instructions.  Will watch your condition.  Will get help right away if you are not doing well or get worse. Document Released: 10/30/2006 Document Revised: 09/11/2011 Document Reviewed: 12/31/2006 [facility] Patient Information 2014 [place], [place].   ________________________________________________________________________

## 2020-01-22 ENCOUNTER — Other Ambulatory Visit: Payer: Self-pay

## 2020-01-22 ENCOUNTER — Encounter (HOSPITAL_COMMUNITY)
Admission: RE | Admit: 2020-01-22 | Discharge: 2020-01-22 | Disposition: A | Discharge status: Home / Self Care | Payer: Medicare Other | Source: Ambulatory Visit | Attending: Orthopedic Surgery | Admitting: Orthopedic Surgery

## 2020-01-22 ENCOUNTER — Encounter (HOSPITAL_COMMUNITY): Payer: Self-pay

## 2020-01-22 DIAGNOSIS — Z79899 Other long term (current) drug therapy: Secondary | ICD-10-CM | POA: Insufficient documentation

## 2020-01-22 DIAGNOSIS — M75101 Unspecified rotator cuff tear or rupture of right shoulder, not specified as traumatic: Secondary | ICD-10-CM | POA: Insufficient documentation

## 2020-01-22 DIAGNOSIS — Z01812 Encounter for preprocedural laboratory examination: Secondary | ICD-10-CM | POA: Diagnosis not present

## 2020-01-22 DIAGNOSIS — I2510 Atherosclerotic heart disease of native coronary artery without angina pectoris: Secondary | ICD-10-CM | POA: Insufficient documentation

## 2020-01-22 DIAGNOSIS — I10 Essential (primary) hypertension: Secondary | ICD-10-CM | POA: Insufficient documentation

## 2020-01-22 DIAGNOSIS — I739 Peripheral vascular disease, unspecified: Secondary | ICD-10-CM | POA: Diagnosis not present

## 2020-01-22 DIAGNOSIS — G4730 Sleep apnea, unspecified: Secondary | ICD-10-CM | POA: Insufficient documentation

## 2020-01-22 DIAGNOSIS — Z7982 Long term (current) use of aspirin: Secondary | ICD-10-CM | POA: Insufficient documentation

## 2020-01-22 DIAGNOSIS — Z951 Presence of aortocoronary bypass graft: Secondary | ICD-10-CM | POA: Insufficient documentation

## 2020-01-22 LAB — CBC
HCT: 41.9 % (ref 39.0–52.0)
Hemoglobin: 13.9 g/dL (ref 13.0–17.0)
MCH: 30.8 pg (ref 26.0–34.0)
MCHC: 33.2 g/dL (ref 30.0–36.0)
MCV: 92.9 fL (ref 80.0–100.0)
Platelets: 208 10*3/uL (ref 150–400)
RBC: 4.51 MIL/uL (ref 4.22–5.81)
RDW: 13.3 % (ref 11.5–15.5)
WBC: 6 10*3/uL (ref 4.0–10.5)
nRBC: 0 % (ref 0.0–0.2)

## 2020-01-22 LAB — SURGICAL PCR SCREEN
MRSA, PCR: NEGATIVE
Staphylococcus aureus: NEGATIVE

## 2020-01-22 LAB — BASIC METABOLIC PANEL
Anion gap: 10 (ref 5–15)
BUN: 19 mg/dL (ref 8–23)
CO2: 25 mmol/L (ref 22–32)
Calcium: 8.8 mg/dL — ABNORMAL LOW (ref 8.9–10.3)
Chloride: 103 mmol/L (ref 98–111)
Creatinine, Ser: 0.72 mg/dL (ref 0.61–1.24)
GFR calc Af Amer: >60 mL/min (ref >60)
GFR calc non Af Amer: >60 mL/min (ref >60)
Glucose, Bld: 101 mg/dL — ABNORMAL HIGH (ref 70–99)
Potassium: 4.1 mmol/L (ref 3.5–5.1)
Sodium: 138 mmol/L (ref 135–145)

## 2020-01-22 NOTE — Progress Notes (Signed)
COVID Vaccine Completed:yes Date COVID Vaccine completed:08/26/19 COVID vaccine manufacturer: [place]   * [employer]   PCP - Dr. Rory Percy Cardiologist - Dr. Rozann Lesches. LOV: 10/08/19. Epic. Clearance: Deberah Pelton NPC: 12/12/19  Chest x-ray -  EKG - 08/19/19. EPIC Stress Test - 10/08/19 ECHO -  Cardiac Cath -   Sleep Study - yes CPAP - yes  Fasting Blood Sugar -  Checks Blood Sugar _____ times a day  Blood Thinner Instructions: RN advised pt. To call cardiologist to ask for instructions for Aspirin 325 mg. Aspirin Instructions: Last Dose:  Anesthesia review: Hx: HTN,MI,Aflutter,OSA(C-Pap). Pt. Is independent,still very active,walk long distances without exertion.  Patient denies shortness of breath, fever, cough and chest pain at PAT appointment   Patient verbalized understanding of instructions that were given to them at the PAT appointment. Patient was also instructed that they will need to review over the PAT instructions again at home before surgery.

## 2020-01-23 ENCOUNTER — Telehealth: Payer: Self-pay | Admitting: *Deleted

## 2020-01-23 NOTE — Telephone Encounter (Signed)
Contacted office to see when he should starting holding aspirin prior to should surgery that is planned for next Friday, 01/30/2020. Confirmed that he has been taking aspirin 325 mg daily. Advised that at his April 2021 visit, he was asked to decrease to 81 mg aspirin daily. Verbalized understanding.  Discussed with [name]. Per [name], if aspirin need to be held-7 days prior is okay but should speak with surgeon office to verify if he should hold aspirin. Patient says he was directed to contact our office by the surgeon's office. Advised to hold aspirin 7 days prior to shoulder surgery.  Patient informed and verbalized understanding of plan

## 2020-01-26 NOTE — Anesthesia Preprocedure Evaluation (Addendum)
Anesthesia Evaluation  Patient identified by MRN, date of birth, ID band Patient awake    Reviewed: Allergy & Precautions, NPO status , Patient's Chart, lab work & pertinent test results  Airway Mallampati: III  TM Distance: <3 FB Neck ROM: Limited    Dental no notable dental hx.    Pulmonary sleep apnea and Continuous Positive Airway Pressure Ventilation ,    breath sounds clear to auscultation + decreased breath sounds      Cardiovascular hypertension, + CAD, + Past MI, + Cardiac Stents and + CABG  Normal cardiovascular exam+ dysrhythmias Atrial Fibrillation  Rhythm:Regular Rate:Normal     Neuro/Psych negative neurological ROS  negative psych ROS   GI/Hepatic negative GI ROS, Neg liver ROS,   Endo/Other  Morbid obesity  Renal/GU negative Renal ROS  negative genitourinary   Musculoskeletal negative musculoskeletal ROS (+)   Abdominal (+) + obese,   Peds negative pediatric ROS (+)  Hematology negative hematology ROS (+)   Anesthesia Other Findings   Reproductive/Obstetrics negative OB ROS                            Anesthesia Physical Anesthesia Plan  ASA: III  Anesthesia Plan: General   Post-op Pain Management:  Regional for Post-op pain   Induction: Intravenous  PONV Risk Score and Plan: 2 and Ondansetron, Dexamethasone and Treatment may vary due to age or medical condition  Airway Management Planned: Oral ETT and Video Laryngoscope Planned  Additional Equipment:   Intra-op Plan:   Post-operative Plan: Extubation in OR  Informed Consent: I have reviewed the patients History and Physical, chart, labs and discussed the procedure including the risks, benefits and alternatives for the proposed anesthesia with the patient or authorized representative who has indicated his/her understanding and acceptance.     Dental advisory given  Plan Discussed with: CRNA and  Surgeon  Anesthesia Plan Comments: (See PAT note 01/22/2020, Konrad Felix, PA-C)       Anesthesia Quick Evaluation

## 2020-01-26 NOTE — Progress Notes (Signed)
Anesthesia Chart Review   Case: [id number] Date/Time: 01/30/20 0715   Procedure: REVERSE SHOULDER ARTHROPLASTY (Right Shoulder) - interscalene block   Anesthesia type: General   Pre-op diagnosis: Right shoulder rotator cuff tear arthroplathy   Location: [facility] ROOM 06 / [facility]   Surgeons: Netta Cedars, MD      DISCUSSION:69 y.o. never smoker with h/o PVD, PAF, HTN, sleep apnea w/CPAP, CAD (s/p stent, CABG 2005), right shoulder rotator cuff tear scheduled for above procedure 01/30/2020 with Dr. Netta Cedars.   Low risk stress test 10/16/2019 with EF 57%.   Per cardiology preoperative risk assessment 12/12/2019, "Chart reviewed as part of pre-operative protocol coverage. Given past medical history and time since last visit, based on ACC/AHA guidelines, KYI ROMANELLO would be at acceptable risk for the planned procedure without further cardiovascular testing.  His RCRI is a class II risk, 0.9% risk of major cardiac event."  Anticipate pt can proceed with planned procedure barring acute status change.   VS: BP (!) 150/70   Pulse 61   Temp 36.9 C (Oral)   Resp 16   Ht 5\' 8"  (1.727 m)   Wt (!) 124.3 kg   SpO2 95%   BMI 41.66 kg/m   PROVIDERS: Rory Percy, MD is PCP   Rozann Lesches, MD is Cardiologist  LABS: Labs reviewed: Acceptable for surgery. (all labs ordered are listed, but only abnormal results are displayed)  Labs Reviewed  BASIC METABOLIC PANEL - Abnormal; Notable for the following components:      Result Value   Glucose, Bld 101 (*)    Calcium 8.8 (*)    All other components within normal limits  SURGICAL PCR SCREEN  CBC     IMAGES:   EKG: 08/19/2019 Rate 67 bpm Sinus rhythm  First degree AV block  Poor R wave progression   CV: Myocardial Perfusion 10/16/2019  No diagnostic ST segment changes to indicate ischemia.  Small size, moderate intensity, partially reversible defect in the mid to apical inferior and predominantly mid inferolateral wall. This is  consistent with scar and a mild region of peri-infarct ischemia.  This is a low risk study.  Nuclear stress EF: 57%.  Echo 08/02/2017 Study Conclusions   - Left ventricle: The cavity size was normal. Wall thickness was  normal. Systolic function was normal. The estimated ejection  fraction was in the range of 55% to 60%. Wall motion was normal;  there were no regional wall motion abnormalities. Features are  consistent with a pseudonormal left ventricular filling pattern,  with concomitant abnormal relaxation and increased filling  pressure (grade 2 diastolic dysfunction). Indeterminate filling  pressures.  - Aortic valve: Moderately calcified annulus. Mildly thickened,  mildly calcified leaflets. Sclerosis without stenosis. There was  mild regurgitation.  - Aorta: Mild aortic root dilatation.  - Atrial septum: No defect or patent foramen ovale was identified.  There was an atrial septal aneurysm.  - Pulmonic valve: There was mild regurgitation.  Past Medical History:  Diagnosis Date  . Aortic root dilatation (HCC)    41 mm by CT 2008, 36 mm by echo 2011  . Arthritis   . Coronary atherosclerosis of native coronary artery    Multiple stents - RCA/LAD/diagonal, overlapping DES LAD, stent thrombosis 2005, ultimately CABG  . Essential hypertension   . H/O hiatal hernia   . History of GI bleed    On nonsteroidals  . Hyperlipemia   . Myocardial infarction [facility])    Anterior with LAD stent thrombosis 2005  .  Obesity   . Paroxysmal atrial flutter ([place])   . Peripheral vascular disease ([place])   . Sleep apnea    CPAP daily    Past Surgical History:  Procedure Laterality Date  . APPENDECTOMY    . BREATH TEK H PYLORI  06/11/2012   Procedure: BREATH TEK H PYLORI;  Surgeon: Shann Medal, MD;  Location: [facility];  Service: General;  Laterality: N/A;  Seth Bake  . CARDIAC SURGERY    . CARPAL TUNNEL RELEASE Right 6/12  . CARPAL TUNNEL RELEASE  06/06/2011    Procedure: CARPAL TUNNEL RELEASE;  Surgeon: Cammie Sickle., MD;  Location: [place];  Service: Orthopedics;  Laterality: Right;  . CARPAL TUNNEL RELEASE Right 04/10/2013   Procedure: REVISION OF CARPAL TUNNEL RELEASE LIGAMENT RIGHT WRIST;  Surgeon: Cammie Sickle., MD;  Location: [place];  Service: Orthopedics;  Laterality: Right;  . CERVICAL FUSION  9/10  . CHOLECYSTECTOMY    . CORONARY ANGIOPLASTY WITH STENT PLACEMENT  [id number]  . CORONARY ARTERY BYPASS GRAFT N/A 12/30/2013   Procedure: CORONARY ARTERY BYPASS GRAFTING (CABG) x three, using left internal mammary artery and right leg greater sapheneous vein harvested endoscopically - LIMA to LAD, SVG-D, SVG-OM1;  Surgeon: Ivin Poot, MD;  Location: [place];  Service: Open Heart Surgery;  Laterality: N/A;  . ELBOW ARTHROSCOPY Right   . LAPAROSCOPIC GASTRIC BANDING  3/14  . LEFT HEART CATHETERIZATION WITH CORONARY ANGIOGRAM N/A 12/26/2013   Procedure: LEFT HEART CATHETERIZATION WITH CORONARY ANGIOGRAM;  Surgeon: Leonie Man, MD;  Location: [facility] CATH LAB;  Service: Cardiovascular;  Laterality: N/A;  . SHOULDER ARTHROSCOPY Right 4/11  . TOTAL KNEE ARTHROPLASTY Right 2006  . TOTAL KNEE ARTHROPLASTY Left 2007  . ULNAR NERVE TRANSPOSITION  06/06/2011   Procedure: ULNAR NERVE DECOMPRESSION/TRANSPOSITION;  Surgeon: Cammie Sickle., MD;  Location: [place];  Service: Orthopedics;  Laterality: Right;  decompression ulnar nerve right cubital tunnel     MEDICATIONS: . aspirin 325 MG tablet  . aspirin EC 81 MG tablet  . atorvastatin (LIPITOR) 20 MG tablet  . b complex vitamins tablet  . Cholecalciferol (VITAMIN D) 125 MCG (5000 UT) CAPS  . Coenzyme Q10 (CO Q-10) 100 MG CAPS  . cyclobenzaprine (FLEXERIL) 10 MG tablet  . EPINEPHrine 0.3 mg/0.3 mL IJ SOAJ injection  . fish oil-omega-3 fatty acids 1000 MG capsule  . Glucosamine-Chondroitin (OSTEO BI-FLEX REGULAR STRENGTH PO)  . losartan  (COZAAR) 100 MG tablet  . losartan (COZAAR) 25 MG tablet  . metoprolol (LOPRESSOR) 50 MG tablet  . Multiple Vitamins-Minerals (EYE VITAMINS PO)  . naproxen (NAPROSYN) 500 MG tablet  . OLIVE LEAF EXTRACT PO  . Omega-3 Fatty Acids (OMEGA 3 500) 500 MG CAPS  . Pomegranate, [place] gran[place], (POMEGRANATE PO)  . Tadalafil (CIALIS) 2.5 MG TABS  . Turmeric 500 MG TABS   No current facility-administered medications for this encounter.     Konrad Felix, PA-C [facility] [phone]

## 2020-01-27 ENCOUNTER — Other Ambulatory Visit (HOSPITAL_COMMUNITY)
Admission: RE | Admit: 2020-01-27 | Discharge: 2020-01-27 | Disposition: A | Discharge status: Home / Self Care | Payer: Medicare Other | Source: Ambulatory Visit | Attending: Orthopedic Surgery | Admitting: Orthopedic Surgery

## 2020-01-27 DIAGNOSIS — Z01812 Encounter for preprocedural laboratory examination: Secondary | ICD-10-CM | POA: Diagnosis not present

## 2020-01-27 DIAGNOSIS — Z20822 Contact with and (suspected) exposure to covid-19: Secondary | ICD-10-CM | POA: Insufficient documentation

## 2020-01-27 LAB — SARS CORONAVIRUS 2 (TAT 6-24 HRS): SARS Coronavirus 2: NEGATIVE

## 2020-01-29 MED ORDER — DEXTROSE 5 % IV SOLN
3.0000 g | INTRAVENOUS | Status: AC
  Administered 2020-01-30: 2 g via INTRAVENOUS
  Filled 2020-01-29: qty 3

## 2020-01-30 ENCOUNTER — Ambulatory Visit (HOSPITAL_COMMUNITY): Payer: Medicare Other | Admitting: Physician Assistant

## 2020-01-30 ENCOUNTER — Encounter (HOSPITAL_COMMUNITY): Payer: Self-pay | Admitting: Orthopedic Surgery

## 2020-01-30 ENCOUNTER — Ambulatory Visit (HOSPITAL_COMMUNITY): Payer: Medicare Other | Admitting: Certified Registered"

## 2020-01-30 ENCOUNTER — Other Ambulatory Visit: Payer: Self-pay

## 2020-01-30 ENCOUNTER — Encounter (HOSPITAL_COMMUNITY)
Admission: RE | Disposition: A | Discharge status: Home / Self Care | Payer: Self-pay | Source: Home / Self Care | Attending: Orthopedic Surgery

## 2020-01-30 ENCOUNTER — Observation Stay (HOSPITAL_COMMUNITY)
Admission: RE | Admit: 2020-01-30 | Discharge: 2020-01-31 | Disposition: A | Discharge status: Home / Self Care | Payer: Medicare Other | Attending: Orthopedic Surgery | Admitting: Orthopedic Surgery

## 2020-01-30 ENCOUNTER — Observation Stay (HOSPITAL_COMMUNITY): Payer: Medicare Other

## 2020-01-30 DIAGNOSIS — I4892 Unspecified atrial flutter: Secondary | ICD-10-CM | POA: Diagnosis not present

## 2020-01-30 DIAGNOSIS — Z7982 Long term (current) use of aspirin: Secondary | ICD-10-CM | POA: Diagnosis not present

## 2020-01-30 DIAGNOSIS — Z79899 Other long term (current) drug therapy: Secondary | ICD-10-CM | POA: Insufficient documentation

## 2020-01-30 DIAGNOSIS — I252 Old myocardial infarction: Secondary | ICD-10-CM | POA: Diagnosis not present

## 2020-01-30 DIAGNOSIS — E785 Hyperlipidemia, unspecified: Secondary | ICD-10-CM | POA: Diagnosis not present

## 2020-01-30 DIAGNOSIS — G8918 Other acute postprocedural pain: Secondary | ICD-10-CM | POA: Diagnosis not present

## 2020-01-30 DIAGNOSIS — M25511 Pain in right shoulder: Secondary | ICD-10-CM | POA: Diagnosis present

## 2020-01-30 DIAGNOSIS — G4730 Sleep apnea, unspecified: Secondary | ICD-10-CM | POA: Insufficient documentation

## 2020-01-30 DIAGNOSIS — M1990 Unspecified osteoarthritis, unspecified site: Secondary | ICD-10-CM | POA: Insufficient documentation

## 2020-01-30 DIAGNOSIS — M75101 Unspecified rotator cuff tear or rupture of right shoulder, not specified as traumatic: Secondary | ICD-10-CM | POA: Diagnosis not present

## 2020-01-30 DIAGNOSIS — M19011 Primary osteoarthritis, right shoulder: Secondary | ICD-10-CM | POA: Diagnosis not present

## 2020-01-30 DIAGNOSIS — Z96611 Presence of right artificial shoulder joint: Secondary | ICD-10-CM | POA: Diagnosis not present

## 2020-01-30 DIAGNOSIS — I2510 Atherosclerotic heart disease of native coronary artery without angina pectoris: Secondary | ICD-10-CM | POA: Insufficient documentation

## 2020-01-30 DIAGNOSIS — I10 Essential (primary) hypertension: Secondary | ICD-10-CM | POA: Insufficient documentation

## 2020-01-30 DIAGNOSIS — Z471 Aftercare following joint replacement surgery: Secondary | ICD-10-CM | POA: Diagnosis not present

## 2020-01-30 HISTORY — PX: REVERSE SHOULDER ARTHROPLASTY: SHX5054

## 2020-01-30 SURGERY — ARTHROPLASTY, SHOULDER, TOTAL, REVERSE
Anesthesia: General | Site: Shoulder | Laterality: Right

## 2020-01-30 MED ORDER — HYDROMORPHONE HCL 1 MG/ML IJ SOLN
INTRAMUSCULAR | Status: AC
  Filled 2020-01-30: qty 2

## 2020-01-30 MED ORDER — SUCCINYLCHOLINE CHLORIDE 200 MG/10ML IV SOSY
PREFILLED_SYRINGE | INTRAVENOUS | Status: AC
  Filled 2020-01-30: qty 10

## 2020-01-30 MED ORDER — CYCLOBENZAPRINE HCL 10 MG PO TABS
10.0000 mg | ORAL_TABLET | Freq: Three times a day (TID) | ORAL | Status: DC | PRN
  Administered 2020-01-30: 10 mg via ORAL
  Filled 2020-01-30: qty 1

## 2020-01-30 MED ORDER — TADALAFIL 5 MG PO TABS
2.5000 mg | ORAL_TABLET | Freq: Every day | ORAL | Status: DC
  Administered 2020-01-30: 2.5 mg via ORAL
  Filled 2020-01-30: qty 1

## 2020-01-30 MED ORDER — OXYCODONE-ACETAMINOPHEN 5-325 MG PO TABS: 1.0000 | ORAL_TABLET | Freq: Four times a day (QID) | ORAL | 0 refills | Status: DC | PRN

## 2020-01-30 MED ORDER — ACETAMINOPHEN 325 MG PO TABS: 325.0000 mg | ORAL_TABLET | Freq: Four times a day (QID) | ORAL | Status: DC | PRN

## 2020-01-30 MED ORDER — BISACODYL 10 MG RE SUPP: 10.0000 mg | Freq: Every day | RECTAL | Status: DC | PRN

## 2020-01-30 MED ORDER — CO Q-10 100 MG PO CAPS: 100.0000 mg | ORAL_CAPSULE | Freq: Every day | ORAL | Status: DC

## 2020-01-30 MED ORDER — METOCLOPRAMIDE HCL 5 MG/ML IJ SOLN: 5.0000 mg | Freq: Three times a day (TID) | INTRAMUSCULAR | Status: DC | PRN

## 2020-01-30 MED ORDER — BUPIVACAINE-EPINEPHRINE (PF) 0.25% -1:200000 IJ SOLN
INTRAMUSCULAR | Status: AC
  Filled 2020-01-30: qty 30

## 2020-01-30 MED ORDER — SUCCINYLCHOLINE CHLORIDE 200 MG/10ML IV SOSY
PREFILLED_SYRINGE | INTRAVENOUS | Status: DC | PRN
  Administered 2020-01-30: 200 mg via INTRAVENOUS

## 2020-01-30 MED ORDER — PHENYLEPHRINE HCL (PRESSORS) 10 MG/ML IV SOLN
INTRAVENOUS | Status: AC
  Filled 2020-01-30: qty 1

## 2020-01-30 MED ORDER — MIDAZOLAM HCL 2 MG/2ML IJ SOLN
INTRAMUSCULAR | Status: AC
  Filled 2020-01-30: qty 2

## 2020-01-30 MED ORDER — HYDROMORPHONE HCL 1 MG/ML IJ SOLN
0.5000 mg | INTRAMUSCULAR | Status: DC | PRN
  Administered 2020-01-30 (×2): 0.5 mg via INTRAVENOUS

## 2020-01-30 MED ORDER — FENTANYL CITRATE (PF) 100 MCG/2ML IJ SOLN
INTRAMUSCULAR | Status: DC | PRN
  Administered 2020-01-30: 50 ug via INTRAVENOUS
  Administered 2020-01-30: 25 ug via INTRAVENOUS
  Administered 2020-01-30 (×5): 50 ug via INTRAVENOUS
  Administered 2020-01-30: 25 ug via INTRAVENOUS
  Administered 2020-01-30: 50 ug via INTRAVENOUS

## 2020-01-30 MED ORDER — PROPOFOL 10 MG/ML IV BOLUS
INTRAVENOUS | Status: AC
  Filled 2020-01-30: qty 20

## 2020-01-30 MED ORDER — BUPIVACAINE-EPINEPHRINE (PF) 0.25% -1:200000 IJ SOLN
INTRAMUSCULAR | Status: DC | PRN
  Administered 2020-01-30: 20 mL

## 2020-01-30 MED ORDER — ATORVASTATIN CALCIUM 20 MG PO TABS
20.0000 mg | ORAL_TABLET | Freq: Every day | ORAL | Status: DC
  Administered 2020-01-30: 20 mg via ORAL
  Filled 2020-01-30: qty 1

## 2020-01-30 MED ORDER — EPHEDRINE SULFATE-NACL 50-0.9 MG/10ML-% IV SOSY
PREFILLED_SYRINGE | INTRAVENOUS | Status: DC | PRN
  Administered 2020-01-30: 10 mg via INTRAVENOUS
  Administered 2020-01-30 (×2): 5 mg via INTRAVENOUS
  Administered 2020-01-30: 10 mg via INTRAVENOUS

## 2020-01-30 MED ORDER — OXYCODONE HCL 5 MG PO TABS
5.0000 mg | ORAL_TABLET | ORAL | Status: DC | PRN
  Administered 2020-01-30: 10 mg via ORAL
  Filled 2020-01-30: qty 2

## 2020-01-30 MED ORDER — SODIUM CHLORIDE 0.9% FLUSH: 10.0000 mL | INTRAVENOUS | Status: DC | PRN

## 2020-01-30 MED ORDER — VITAMIN D 25 MCG (1000 UNIT) PO TABS
5000.0000 [IU] | ORAL_TABLET | Freq: Every day | ORAL | Status: DC
  Administered 2020-01-30 – 2020-01-31 (×2): 5000 [IU] via ORAL
  Filled 2020-01-30 (×2): qty 5

## 2020-01-30 MED ORDER — FENTANYL CITRATE (PF) 100 MCG/2ML IJ SOLN
INTRAMUSCULAR | Status: AC
  Filled 2020-01-30: qty 2

## 2020-01-30 MED ORDER — NAPROXEN 250 MG PO TABS
500.0000 mg | ORAL_TABLET | Freq: Two times a day (BID) | ORAL | Status: DC
  Administered 2020-01-30 – 2020-01-31 (×2): 500 mg via ORAL
  Filled 2020-01-30 (×2): qty 2

## 2020-01-30 MED ORDER — LOSARTAN POTASSIUM 25 MG PO TABS: 25.0000 mg | ORAL_TABLET | Freq: Every day | ORAL | Status: DC

## 2020-01-30 MED ORDER — PHENOL 1.4 % MT LIQD: 1.0000 | OROMUCOSAL | Status: DC | PRN

## 2020-01-30 MED ORDER — LACTATED RINGERS IV SOLN: INTRAVENOUS | Status: DC

## 2020-01-30 MED ORDER — B COMPLEX-C PO TABS
1.0000 | ORAL_TABLET | Freq: Every day | ORAL | Status: DC
  Filled 2020-01-30: qty 1

## 2020-01-30 MED ORDER — PHENYLEPHRINE HCL-NACL 10-0.9 MG/250ML-% IV SOLN
INTRAVENOUS | Status: DC | PRN
  Administered 2020-01-30: 25 ug/min via INTRAVENOUS

## 2020-01-30 MED ORDER — ACETAMINOPHEN 10 MG/ML IV SOLN: 1000.0000 mg | Freq: Once | INTRAVENOUS | Status: DC | PRN

## 2020-01-30 MED ORDER — MIDAZOLAM HCL 2 MG/2ML IJ SOLN
INTRAMUSCULAR | Status: DC | PRN
  Administered 2020-01-30: 2 mg via INTRAVENOUS

## 2020-01-30 MED ORDER — HYDROMORPHONE HCL 1 MG/ML IJ SOLN: 0.5000 mg | INTRAMUSCULAR | Status: DC | PRN

## 2020-01-30 MED ORDER — TURMERIC 500 MG PO TABS: 500.0000 mg | ORAL_TABLET | Freq: Two times a day (BID) | ORAL | Status: DC

## 2020-01-30 MED ORDER — ONDANSETRON HCL 4 MG/2ML IJ SOLN
INTRAMUSCULAR | Status: DC | PRN
  Administered 2020-01-30: 4 mg via INTRAVENOUS

## 2020-01-30 MED ORDER — BUPIVACAINE HCL (PF) 0.5 % IJ SOLN
INTRAMUSCULAR | Status: DC | PRN
Start: 2020-01-30 — End: 2020-01-30
  Administered 2020-01-30: 15 mL via PERINEURAL

## 2020-01-30 MED ORDER — ASPIRIN 325 MG PO TABS
325.0000 mg | ORAL_TABLET | Freq: Every day | ORAL | Status: DC
  Administered 2020-01-30: 325 mg via ORAL
  Filled 2020-01-30: qty 1

## 2020-01-30 MED ORDER — LIDOCAINE 2% (20 MG/ML) 5 ML SYRINGE
INTRAMUSCULAR | Status: DC | PRN
  Administered 2020-01-30: 100 mg via INTRAVENOUS

## 2020-01-30 MED ORDER — CYCLOBENZAPRINE HCL 10 MG PO TABS: 10.0000 mg | ORAL_TABLET | Freq: Three times a day (TID) | ORAL | 0 refills | Status: AC | PRN

## 2020-01-30 MED ORDER — STERILE WATER FOR IRRIGATION IR SOLN
Status: DC | PRN
  Administered 2020-01-30 (×2): 1000 mL

## 2020-01-30 MED ORDER — HYDROMORPHONE HCL 1 MG/ML IJ SOLN
0.2500 mg | INTRAMUSCULAR | Status: DC | PRN
  Administered 2020-01-30 (×4): 0.5 mg via INTRAVENOUS

## 2020-01-30 MED ORDER — POLYETHYLENE GLYCOL 3350 17 G PO PACK: 17.0000 g | PACK | Freq: Every day | ORAL | Status: DC | PRN

## 2020-01-30 MED ORDER — BUPIVACAINE LIPOSOME 1.3 % IJ SUSP
INTRAMUSCULAR | Status: DC | PRN
  Administered 2020-01-30: 10 mL

## 2020-01-30 MED ORDER — MENTHOL 3 MG MT LOZG: 1.0000 | LOZENGE | OROMUCOSAL | Status: DC | PRN

## 2020-01-30 MED ORDER — METOPROLOL TARTRATE 50 MG PO TABS
50.0000 mg | ORAL_TABLET | Freq: Two times a day (BID) | ORAL | Status: DC
  Administered 2020-01-30 – 2020-01-31 (×3): 50 mg via ORAL
  Filled 2020-01-30 (×3): qty 1

## 2020-01-30 MED ORDER — METOCLOPRAMIDE HCL 5 MG PO TABS: 5.0000 mg | ORAL_TABLET | Freq: Three times a day (TID) | ORAL | Status: DC | PRN

## 2020-01-30 MED ORDER — HYDROMORPHONE HCL 1 MG/ML IJ SOLN
INTRAMUSCULAR | Status: AC
  Filled 2020-01-30: qty 1

## 2020-01-30 MED ORDER — SODIUM CHLORIDE 0.9 % IV SOLN
INTRAVENOUS | Status: DC
  Administered 2020-01-30: 50 mL/h via INTRAVENOUS

## 2020-01-30 MED ORDER — CEFAZOLIN SODIUM-DEXTROSE 2-4 GM/100ML-% IV SOLN
2.0000 g | Freq: Four times a day (QID) | INTRAVENOUS | Status: AC
  Administered 2020-01-30 – 2020-01-31 (×3): 2 g via INTRAVENOUS
  Filled 2020-01-30 (×3): qty 100

## 2020-01-30 MED ORDER — EPHEDRINE 5 MG/ML INJ
INTRAVENOUS | Status: AC
  Filled 2020-01-30: qty 10

## 2020-01-30 MED ORDER — PROPOFOL 10 MG/ML IV BOLUS
INTRAVENOUS | Status: DC | PRN
  Administered 2020-01-30: 200 mg via INTRAVENOUS
  Administered 2020-01-30 (×2): 50 mg via INTRAVENOUS

## 2020-01-30 MED ORDER — ONDANSETRON HCL 4 MG/2ML IJ SOLN: 4.0000 mg | Freq: Four times a day (QID) | INTRAMUSCULAR | Status: DC | PRN

## 2020-01-30 MED ORDER — DOCUSATE SODIUM 100 MG PO CAPS
100.0000 mg | ORAL_CAPSULE | Freq: Two times a day (BID) | ORAL | Status: DC
  Administered 2020-01-30: 100 mg via ORAL
  Filled 2020-01-30 (×2): qty 1

## 2020-01-30 MED ORDER — SODIUM CHLORIDE 0.9 % IR SOLN
Status: DC | PRN
  Administered 2020-01-30: 1000 mL

## 2020-01-30 MED ORDER — LOSARTAN POTASSIUM 50 MG PO TABS
100.0000 mg | ORAL_TABLET | Freq: Every day | ORAL | Status: DC
  Administered 2020-01-30 – 2020-01-31 (×2): 100 mg via ORAL
  Filled 2020-01-30 (×2): qty 2

## 2020-01-30 MED ORDER — ONDANSETRON HCL 4 MG/2ML IJ SOLN
INTRAMUSCULAR | Status: AC
  Filled 2020-01-30: qty 2

## 2020-01-30 MED ORDER — ONDANSETRON HCL 4 MG PO TABS: 4.0000 mg | ORAL_TABLET | Freq: Four times a day (QID) | ORAL | Status: DC | PRN

## 2020-01-30 MED ORDER — LIDOCAINE 2% (20 MG/ML) 5 ML SYRINGE
INTRAMUSCULAR | Status: AC
  Filled 2020-01-30: qty 5

## 2020-01-30 MED ORDER — OMEGA 3 500 500 MG PO CAPS: 500.0000 mg | ORAL_CAPSULE | Freq: Every day | ORAL | Status: DC

## 2020-01-30 MED ORDER — DEXAMETHASONE SODIUM PHOSPHATE 10 MG/ML IJ SOLN
INTRAMUSCULAR | Status: AC
  Filled 2020-01-30: qty 1

## 2020-01-30 MED ORDER — DEXAMETHASONE SODIUM PHOSPHATE 10 MG/ML IJ SOLN
INTRAMUSCULAR | Status: DC | PRN
  Administered 2020-01-30: 8 mg via INTRAVENOUS

## 2020-01-30 SURGICAL SUPPLY — 76 items
AID PSTN UNV HD RSTRNT DISP (MISCELLANEOUS) ×1
BAG SPEC THK2 15X12 ZIP CLS (MISCELLANEOUS)
BAG ZIPLOCK 12X15 (MISCELLANEOUS) IMPLANT
BIT DRILL 1.6MX128 (BIT) IMPLANT
BIT DRILL 1.6MX128MM (BIT)
BIT DRILL 170X2.5X (BIT) IMPLANT
BIT DRL 170X2.5X (BIT) ×1
BLADE SAG 18X100X1.27 (BLADE) ×3 IMPLANT
CLOSURE WOUND 1/2 X4 (GAUZE/BANDAGES/DRESSINGS) ×1
COVER BACK TABLE 60X90IN (DRAPES) ×3 IMPLANT
COVER SURGICAL LIGHT HANDLE (MISCELLANEOUS) ×3 IMPLANT
COVER WAND RF STERILE (DRAPES) IMPLANT
CUP HUMERAL 42 PLUS 3 (Orthopedic Implant) ×2 IMPLANT
DECANTER SPIKE VIAL GLASS SM (MISCELLANEOUS) ×3 IMPLANT
DRAPE INCISE IOBAN 66X45 STRL (DRAPES) ×3 IMPLANT
DRAPE ORTHO SPLIT 77X108 STRL (DRAPES) ×6
DRAPE SHEET LG 3/4 BI-LAMINATE (DRAPES) ×3 IMPLANT
DRAPE SURG ORHT 6 SPLT 77X108 (DRAPES) ×2 IMPLANT
DRAPE TOP 10253 STERILE (DRAPES) ×3 IMPLANT
DRAPE U-SHAPE 47X51 STRL (DRAPES) ×3 IMPLANT
DRILL 2.5 (BIT) ×3
DRSG ADAPTIC 3X8 NADH LF (GAUZE/BANDAGES/DRESSINGS) ×3 IMPLANT
DRSG PAD ABDOMINAL 8X10 ST (GAUZE/BANDAGES/DRESSINGS) ×3 IMPLANT
DURAPREP 26ML APPLICATOR (WOUND CARE) ×3 IMPLANT
ELECT BLADE TIP CTD 4 INCH (ELECTRODE) ×3 IMPLANT
ELECT NDL TIP 2.8 STRL (NEEDLE) ×1 IMPLANT
ELECT NEEDLE TIP 2.8 STRL (NEEDLE) ×3 IMPLANT
ELECT REM PT RETURN 15FT ADLT (MISCELLANEOUS) ×3 IMPLANT
EPIPHSYSI CENT SZ 2 (Orthopedic Implant) ×3 IMPLANT
EPIPHYSIS CENT SZ 2 (Orthopedic Implant) IMPLANT
GAUZE SPONGE 4X4 12PLY STRL (GAUZE/BANDAGES/DRESSINGS) ×3 IMPLANT
GLENOSPHERE XTEND LAT 42+0 STD (Miscellaneous) ×2 IMPLANT
GLOVE BIOGEL PI ORTHO PRO 7.5 (GLOVE) ×2
GLOVE BIOGEL PI ORTHO PRO SZ8 (GLOVE) ×2
GLOVE ORTHO TXT STRL SZ7.5 (GLOVE) ×3 IMPLANT
GLOVE PI ORTHO PRO STRL 7.5 (GLOVE) ×1 IMPLANT
GLOVE PI ORTHO PRO STRL SZ8 (GLOVE) ×1 IMPLANT
GLOVE SURG ORTHO 8.5 STRL (GLOVE) ×3 IMPLANT
GOWN STRL REUS W/TWL XL LVL3 (GOWN DISPOSABLE) ×6 IMPLANT
KIT BASIN OR (CUSTOM PROCEDURE TRAY) ×3 IMPLANT
KIT TURNOVER KIT A (KITS) IMPLANT
MANIFOLD NEPTUNE II (INSTRUMENTS) ×3 IMPLANT
METAGLENE DELTA EXTEND (Trauma) IMPLANT
METAGLENE DXTEND (Trauma) ×3 IMPLANT
NDL MAYO CATGUT SZ4 TPR NDL (NEEDLE) IMPLANT
NEEDLE MAYO CATGUT SZ4 (NEEDLE) IMPLANT
NS IRRIG 1000ML POUR BTL (IV SOLUTION) ×3 IMPLANT
PACK SHOULDER (CUSTOM PROCEDURE TRAY) ×3 IMPLANT
PENCIL SMOKE EVACUATOR (MISCELLANEOUS) IMPLANT
PIN GUIDE 1.2 (PIN) ×2 IMPLANT
PIN GUIDE GLENOPHERE 1.5MX300M (PIN) ×2 IMPLANT
PIN METAGLENE 2.5 (PIN) ×2 IMPLANT
PROTECTOR NERVE ULNAR (MISCELLANEOUS) ×3 IMPLANT
RESTRAINT HEAD UNIVERSAL NS (MISCELLANEOUS) ×3 IMPLANT
SCREW 4.5X24MM (Screw) ×3 IMPLANT
SCREW 4.5X36MM (Screw) ×2 IMPLANT
SCREW BN 24X4.5XLCK STRL (Screw) IMPLANT
SCREW LOCK 42 (Screw) ×2 IMPLANT
SLING ARM FOAM STRAP LRG (SOFTGOODS) IMPLANT
SLING ARM FOAM STRAP XLG (SOFTGOODS) ×2 IMPLANT
SMARTMIX MINI TOWER (MISCELLANEOUS)
SPONGE LAP 4X18 RFD (DISPOSABLE) IMPLANT
STEM 12 HA (Stem) ×2 IMPLANT
STRIP CLOSURE SKIN 1/2X4 (GAUZE/BANDAGES/DRESSINGS) ×2 IMPLANT
SUCTION FRAZIER HANDLE 10FR (MISCELLANEOUS) ×3
SUCTION TUBE FRAZIER 10FR DISP (MISCELLANEOUS) ×1 IMPLANT
SUT FIBERWIRE #2 38 T-5 BLUE (SUTURE) ×6
SUT MNCRL AB 4-0 PS2 18 (SUTURE) ×3 IMPLANT
SUT VIC AB 0 CT1 36 (SUTURE) ×6 IMPLANT
SUT VIC AB 0 CT2 27 (SUTURE) ×3 IMPLANT
SUT VIC AB 2-0 CT1 27 (SUTURE) ×3
SUT VIC AB 2-0 CT1 TAPERPNT 27 (SUTURE) ×1 IMPLANT
SUTURE FIBERWR #2 38 T-5 BLUE (SUTURE) ×2 IMPLANT
TOWEL OR 17X26 10 PK STRL BLUE (TOWEL DISPOSABLE) ×3 IMPLANT
TOWER SMARTMIX MINI (MISCELLANEOUS) IMPLANT
YANKAUER SUCT BULB TIP 10FT TU (MISCELLANEOUS) ×3 IMPLANT

## 2020-01-30 NOTE — Anesthesia Procedure Notes (Signed)
Anesthesia Procedure Image    

## 2020-01-30 NOTE — Anesthesia Procedure Notes (Signed)
Anesthesia Regional Block: Interscalene brachial plexus block   Pre-Anesthetic Checklist: ,, timeout performed, Correct Patient, Correct Site, Correct Laterality, Correct Procedure, Correct Position, site marked, Risks and benefits discussed,  Surgical consent,  Pre-op evaluation,  At surgeon's request and post-op pain management  Laterality: Right  Prep: chloraprep       Needles:  Injection technique: Single-shot  Needle Type: Echogenic Needle     Needle Length: 9cm      Additional Needles:   Procedures:,,,, ultrasound used (permanent image in chart),,,,  Narrative:  Start time: 01/30/2020 7:00 AM End time: 01/30/2020 7:08 AM Injection made incrementally with aspirations every 5 mL.  Performed by: Personally  Anesthesiologist: Myrtie Soman, MD  Additional Notes: Patient tolerated the procedure well without complications

## 2020-01-30 NOTE — Anesthesia Postprocedure Evaluation (Signed)
Anesthesia Post Note  Patient: Craige Cotta Deakin  Procedure(s) Performed: REVERSE SHOULDER ARTHROPLASTY (Right Shoulder)     Patient location during evaluation: PACU Anesthesia Type: General Level of consciousness: awake and alert Pain management: pain level controlled Vital Signs Assessment: post-procedure vital signs reviewed and stable Respiratory status: spontaneous breathing, nonlabored ventilation, respiratory function stable and patient connected to nasal cannula oxygen Cardiovascular status: blood pressure returned to baseline and stable Postop Assessment: no apparent nausea or vomiting Anesthetic complications: no   No complications documented.  Last Vitals:  Vitals:   01/30/20 1230 01/30/20 1334  BP: (!) 166/82 (!) 152/76  Pulse: 73 71  Resp: 15 18  Temp: 36.8 C 36.8 C  SpO2: 98% 95%    Last Pain:  Vitals:   01/30/20 1334  TempSrc: Oral  PainSc:                  Lucan Riner S

## 2020-01-30 NOTE — Brief Op Note (Signed)
01/30/2020  9:27 AM  PATIENT:  [name]  69 y.o. male  PRE-OPERATIVE DIAGNOSIS:  Right shoulder rotator cuff tear arthroplathy  POST-OPERATIVE DIAGNOSIS:  Right shoulder rotator cuff tear arthroplathy  PROCEDURE:  Procedure(s) with comments: REVERSE SHOULDER ARTHROPLASTY (Right) - interscalene block DePuy Delta extend with NO subscap repair  SURGEON:  Surgeon(s) and Role:    Netta Cedars, MD - Primary  PHYSICIAN ASSISTANT:   ASSISTANTS: [place] Bruns, PA-C   ANESTHESIA:   regional and general  EBL:  175 mL   BLOOD ADMINISTERED:none  DRAINS: none   LOCAL MEDICATIONS USED:  MARCAINE     SPECIMEN:  No Specimen  DISPOSITION OF SPECIMEN:  N/A  COUNTS:  YES  TOURNIQUET:  * No tourniquets in log *  DICTATION: .Other Dictation: Dictation Number [id number]  PLAN OF CARE: Admit for overnight observation  PATIENT DISPOSITION:  PACU - hemodynamically stable.   Delay start of Pharmacological VTE agent (>24hrs) due to surgical blood loss or risk of bleeding: not applicable

## 2020-01-30 NOTE — Progress Notes (Signed)
PACU note; Dr. Kalman Shan in ;  right shoulder interscalene block ultrasound guided ; additional med injected

## 2020-01-30 NOTE — Discharge Instructions (Signed)
Ice to the shoulder constantly.  Keep the incision covered and clean and dry for one week, then ok to get it wet in the shower. ° °Do exercise as instructed several times per day. ° °DO NOT reach behind your back or push up out of a chair with the operative arm. ° °Use a sling while you are up and around for comfort, may remove while seated.  Keep pillow propped behind the operative elbow. ° °Follow up with Dr Derric Dealmeida in two weeks in the office, call [phone] for appt °

## 2020-01-30 NOTE — Anesthesia Procedure Notes (Signed)
Procedure Name: Intubation Date/Time: 01/30/2020 7:43 AM Performed by: Niel Hummer, CRNA Pre-anesthesia Checklist: Patient identified, Emergency Drugs available, Suction available and Patient being monitored Patient Re-evaluated:Patient Re-evaluated prior to induction Oxygen Delivery Method: Circle system utilized Preoxygenation: Pre-oxygenation with 100% oxygen Induction Type: IV induction Ventilation: Two handed mask ventilation required and Oral airway inserted - appropriate to patient size Laryngoscope Size: Mac and 4 Grade View: Grade I Tube type: Oral Tube size: 7.5 mm Number of attempts: 1 Airway Equipment and Method: Stylet Placement Confirmation: ETT inserted through vocal cords under direct vision,  positive ETCO2 and breath sounds checked- equal and bilateral Secured at: 23 cm Tube secured with: Tape Dental Injury: Teeth and Oropharynx as per pre-operative assessment

## 2020-01-30 NOTE — Op Note (Signed)
NAME: [name], [name] MEDICAL RECORD DG:[id number] ACCOUNT [id number] DATE OF BIRTH:12-Nov-1950 FACILITY: [facility] LOCATION: [facility] PHYSICIAN:STEVEN R. Andromeda Poppen, MD  OPERATIVE REPORT  DATE OF PROCEDURE:  01/30/2020  PREOPERATIVE DIAGNOSIS:  Right shoulder rotator cuff tear arthropathy.  POSTOPERATIVE DIAGNOSIS:  Right shoulder rotator cuff tear arthropathy.  PROCEDURE PERFORMED:  Right reverse total shoulder replacement with no subscapularis repair using DePuy Delta Xtend prosthesis.  ATTENDING SURGEON:  Esmond Plants, MD  ASSISTANT:  Darol Destine, [place], who was scrubbed during the entire procedure and necessary for satisfactory completion of surgery.  ANESTHESIA:  General anesthesia was used plus interscalene block.  ESTIMATED BLOOD LOSS:  200 mL.  FLUID REPLACEMENT:  1500 mL crystalloid.  INSTRUMENT COUNTS:  Correct.  COMPLICATIONS:  There were no complications.  ANTIBIOTICS:  Perioperative antibiotics were given.  INDICATIONS:  The patient is a 69 year old male with progressively worsening pain and dysfunction secondary to rotator cuff tear arthropathy.  The patient has had a failure of conservative management over an extended period of time and presents for  operative shoulder arthroplasty to relieve pain and restore function.  Informed consent obtained.  DESCRIPTION OF PROCEDURE:  After an adequate level of anesthesia was achieved, the patient was positioned in modified beach chair position.  Right shoulder correctly identified and sterilely prepped and draped in the usual manner.  Time-out called,  verifying correct patient and correct site.  We entered the patient's shoulder using a deltopectoral incision, starting at the coracoid process, extending down to the anterior humerus, dissection down through subcutaneous tissues.  Cephalic vein  identified, taken laterally with the deltoid, pectoralis taken medially.  Conjoined tendon identified and retracted medially.  Biceps  was tenodesed in situ with 0 Vicryl figure-of-eight suture, followed by release of the subscapularis subperiosteally off  the lesser tuberosity.  This tendon was thin and in poor condition with some undermining at the superior aspect of it and felt not to be repairable.  We did go ahead and tag it for protection of the axillary nerve and retraction.  We released the  inferior capsule, progressively externally rotating and releasing the biceps tendon and the anterior supraspinatus which was still intact.  We then were able to deliver the humeral head out of the wound with the [facility] retractor and the large [place].  We  had good exposure.  We entered the proximal humerus, which was eburnated bone with a 6 mm reamer, reaming up to a size 12.  We then placed our 12 mm T-handled intramedullary resection guide and resected the head at 10 degrees of retroversion with the  oscillating saw.  We removed osteophytes with a rongeur, subluxed the humerus posteriorly and did removal of the remainder of the rotator cuff back to the teres minor, which was preserved.  Next, we removed the labrum and the capsule, getting good  exposure of the glenoid.  There was significant superior and posterior wear in this glenoid.  We found our center point for our guide pin using the guide for the metaglene baseplate.  We then drilled that guide pin and reamed for the metaglene baseplate,  getting about 70% of a smooth flat surface for support of the metaglene baseplate.  The superior posterior portion of it was too eroded and felt that we did not need to augment based on how much baseplate contact we would have.  We then drilled our  central peg hole after reaming peripherally and then impacted the metaglene baseplate with good support.  We had a flush  mount for the superior screw, the inferior screw and the anterior screw.  We placed a 42 screw inferiorly, 36 at the base of coracoid  and 24 locked; so all 3 were locked screws.  This  gave [place] good baseplate security.  We selected a 42 standard glenosphere and placed that onto the metaglene baseplate and secured that with a central screw.  Next, we completed our humeral preparation  with the size 2 centered metaphyseal reamer.  We then placed the 12 stem with the 2 centered metaphysis set on the 0 setting and impacted that in 10 degrees of retroversion.  We reduced the shoulder with a 42+3 trial and were happy with soft tissue  balancing.  We removed the trial components, irrigated thoroughly, used impaction grafting technique with available bone graft from the head and then used the HA coated press-fit 12 stem with the 2 centered placed on the 0 setting and then placed in 10  degrees of retroversion.  We impacted that in position.  We had good humeral component stability and then we selected the real 42+3 poly and placed that in the tray and impacted that in position.  We reduced the shoulder, had nice little pop as it seated  and we had no gapping or any type of relative motion between the humeral and the glenoid components.  Thus, we were pleased with our soft tissue balancing.  Our axillary nerve was palpable and under appropriate tension.  We irrigated thoroughly and  resected a remnant of the subscap.  We then closed the deltopectoral interval with 0 Vicryl suture, followed by 2-0 Vicryl for subcutaneous closure and 4-0 Monocryl for skin.  Steri-Strips applied, followed by sterile dressing.  The patient tolerated the  surgery well.  VN/NUANCE  D:01/30/2020 T:01/30/2020 JOB:[id number]

## 2020-01-30 NOTE — Transfer of Care (Signed)
Immediate Anesthesia Transfer of Care Note  Patient: [name]  Procedure(s) Performed: REVERSE SHOULDER ARTHROPLASTY (Right Shoulder)  Patient Location: PACU  Anesthesia Type:General  Level of Consciousness: awake, alert  and oriented  Airway & Oxygen Therapy: Patient Spontanous Breathing and Patient connected to face mask oxygen  Post-op Assessment: Report given to RN, Post -op Vital signs reviewed and stable and Patient moving all extremities X 4  Post vital signs: Reviewed and stable  Last Vitals:  Vitals Value Taken Time  BP    Temp    Pulse 69 01/30/20 0936  Resp 23 01/30/20 0936  SpO2 96 % 01/30/20 0936  Vitals shown include unvalidated device data.  Last Pain:  Vitals:   01/30/20 0551  TempSrc: Oral         Complications: No complications documented.

## 2020-01-30 NOTE — Interval H&P Note (Signed)
History and Physical Interval Note:  01/30/2020 7:29 AM  [name]  has presented today for surgery, with the diagnosis of Right shoulder rotator cuff tear arthroplathy.  The various methods of treatment have been discussed with the patient and family. After consideration of risks, benefits and other options for treatment, the patient has consented to  Procedure(s) with comments: REVERSE SHOULDER ARTHROPLASTY (Right) - interscalene block as a surgical intervention.  The patient's history has been reviewed, patient examined, no change in status, stable for surgery.  I have reviewed the patient's chart and labs.  Questions were answered to the patient's satisfaction.     Augustin Schooling

## 2020-01-31 DIAGNOSIS — M75101 Unspecified rotator cuff tear or rupture of right shoulder, not specified as traumatic: Secondary | ICD-10-CM | POA: Diagnosis not present

## 2020-01-31 LAB — BASIC METABOLIC PANEL
Anion gap: 9 (ref 5–15)
BUN: 12 mg/dL (ref 8–23)
CO2: 24 mmol/L (ref 22–32)
Calcium: 8.6 mg/dL — ABNORMAL LOW (ref 8.9–10.3)
Chloride: 104 mmol/L (ref 98–111)
Creatinine, Ser: 0.68 mg/dL (ref 0.61–1.24)
GFR calc Af Amer: >60 mL/min (ref >60)
GFR calc non Af Amer: >60 mL/min (ref >60)
Glucose, Bld: 144 mg/dL — ABNORMAL HIGH (ref 70–99)
Potassium: 3.7 mmol/L (ref 3.5–5.1)
Sodium: 137 mmol/L (ref 135–145)

## 2020-01-31 LAB — HEMOGLOBIN AND HEMATOCRIT, BLOOD
HCT: 38.1 % — ABNORMAL LOW (ref 39.0–52.0)
Hemoglobin: 12.6 g/dL — ABNORMAL LOW (ref 13.0–17.0)

## 2020-01-31 NOTE — Evaluation (Signed)
Occupational Therapy Evaluation Patient Details Name: [name] MRN: [id number] DOB: 1951/05/20 Today's Date: 01/31/2020    History of Present Illness [name] is a 69 year old man s/p right shoulder replacement.   Clinical Impression   Patient is a 69 year old man s/p right shoulder replacement without functional use of right dominant upper extremity secondary to effects of surgery and shoulder precautions. Therapist provided education and instruction to patient in regards to exercises, precautions, positioning, donning upper extremity clothing and bathing while maintaining shoulder precautions, ice and edema management and donning/doffing sling. Patient verbalized understanding and demonstrated as needed. Patient needed assistance to donn shirt, underwear, and pants and provided with instruction on compensatory strategies to perform ADLs. Patient reporting mild shortness of breath with limited activity and o2 sats monitored and maintained above 93%. Therapist encouraged use of incentive spirometer and reported to RN. Patient to follow up with MD for further therapy needs.      Follow Up Recommendations  No OT follow up;Follow surgeon's recommendation for DC plan and follow-up therapies    Equipment Recommendations  None recommended by OT    Recommendations for Other Services       Precautions / Restrictions Precautions Precautions: Shoulder Type of Shoulder Precautions: AROM of elbow, wrist and hand okay. FF AROM/PROM to degress. Abduction AROM/PROM to 60 degrees. AROM/PROM external rotation to 30 degrees. Shoulder Interventions: Shoulder sling/immobilizer;For comfort (and for sleeping.) Required Braces or Orthoses: Sling Restrictions Weight Bearing Restrictions: Yes RUE Weight Bearing: Non weight bearing      Mobility Bed Mobility Overal bed mobility: Modified Independent             General bed mobility comments: Increased time. Reports feeling short of breath with  bed mobility. o2 sat checked 93%.  Transfers                 General transfer comment: Min guard to stand and ambulate to chair for safety. Patient reports feeling short of breath with activity. o2 sats monitored maintained at 94%.    Balance Overall balance assessment: No apparent balance deficits (not formally assessed)                                         ADL either performed or assessed with clinical judgement   ADL Overall ADL's : Needs assistance/impaired Eating/Feeding: Independent   Grooming: Independent   Upper Body Bathing: Minimal assistance   Lower Body Bathing: Modified independent;Sit to/from stand   Upper Body Dressing : Moderate assistance;Sitting   Lower Body Dressing: Minimal assistance;Sit to/from stand   Toilet Transfer: Designer, fashion/clothing and Hygiene: Minimal assistance       Functional mobility during ADLs: Min guard       Vision Baseline Vision/History: Wears glasses Wears Glasses: Reading only       Perception     Praxis      Pertinent Vitals/Pain Pain Assessment: No/denies pain     Hand Dominance     Extremity/Trunk Assessment Upper Extremity Assessment Upper Extremity Assessment: RUE deficits/detail;LUE deficits/detail RUE Deficits / Details: Decreased ROM/strength secondary to shoulder precautions and s/p shoulder ROM. WFL ROM elbow, wrist, and hand. LUE Deficits / Details: WFL ROM and strength.       Cervical / Trunk Assessment Cervical / Trunk Assessment: Normal   Communication Communication Communication: No difficulties   Cognition Arousal/Alertness: Awake/alert Behavior  During Therapy: WFL for tasks assessed/performed Overall Cognitive Status: Within Functional Limits for tasks assessed                                     General Comments       Exercises     Shoulder Instructions Shoulder Instructions Donning/doffing shirt without moving  shoulder: Patient able to independently direct caregiver;Moderate assistance Method for sponge bathing under operated UE: Patient able to independently direct caregiver;Minimal assistance Donning/doffing sling/immobilizer: Moderate assistance;Patient able to independently direct caregiver Correct positioning of sling/immobilizer: Patient able to independently direct caregiver ROM for elbow, wrist and digits of operated UE: Patient able to independently direct caregiver;Independent Sling wearing schedule (on at all times/off for ADL's): Patient able to independently direct caregiver Proper positioning of operated UE when showering: Patient able to independently direct caregiver Dressing change: Patient able to independently direct caregiver Positioning of UE while sleeping: Patient able to independently direct caregiver;Caregiver independent with task    Home Living Family/patient expects to be discharged to:: Private residence Living Arrangements: Spouse/significant other Available Help at Discharge: Family;Available 24 hours/day Type of Home: House             Bathroom Shower/Tub: Walk-in shower                    Prior Functioning/Environment Level of Independence: Independent                 OT Problem List:        OT Treatment/Interventions:      OT Goals(Current goals can be found in the care plan section) Acute Rehab OT Goals Patient Stated Goal: To go home OT Goal Formulation: All assessment and education complete, DC therapy  OT Frequency:     Barriers to D/C:            Co-evaluation              AM-PAC OT "6 Clicks" Daily Activity     Outcome Measure Help from another person eating meals?: None Help from another person taking care of personal grooming?: None Help from another person toileting, which includes using toliet, bedpan, or urinal?: A Little Help from another person bathing (including washing, rinsing, drying)?: A Little Help  from another person to put on and taking off regular upper body clothing?: A Lot Help from another person to put on and taking off regular lower body clothing?: A Little 6 Click Score: 19   End of Session Nurse Communication:  (OT education complete. Patient reports shortness of breath with activity.)  Activity Tolerance: Patient tolerated treatment well Patient left: in chair;with call bell/phone within reach  OT Visit Diagnosis: Muscle weakness (generalized) (M62.81)                Time: 3354-5625 OT Time Calculation (min): 34 min Charges:  OT General Charges $OT Visit: 1 Visit OT Evaluation $OT Eval Low Complexity: 1 Low OT Treatments $Self Care/Home Management : 8-22 mins  Elysa Womac, OTR/L [employer]  Office [phone] Pager: [place] 01/31/2020, 3:46 PM

## 2020-01-31 NOTE — Progress Notes (Addendum)
Subjective: 1 Day Post-Op Procedure(s) (LRB): REVERSE SHOULDER ARTHROPLASTY (Right) Patient reports pain as mild.   Patient seen in rounds for Dr. Veverly Fells. Patient is resting in the recliner with his wife at the bedside this morning. He is anxious to get home today. No acute events overnight. He reports that he noticed he was feeling somewhat short of breath while rolling around in bed, and somewhat when ambulating with OT today. He does not feel SHOB at rest currently. He denies CP, N/V, cough. He has sleep apnea on CPAP, and admits he has not been active at home lately which he says may be contributing. He denies history of a fib, DVT/PE, asthma/COPD. He otherwise feels well.  We will continue therapy today.   Objective: Vital signs in last 24 hours: Temp:  [97.7 F (36.5 C)-99 F (37.2 C)] 98.9 F (37.2 C) (07/31 0958) Pulse Rate:  [50-73] 65 (07/31 0958) Resp:  [14-24] 24 (07/31 0958) BP: (121-178)/(48-89) 148/82 (07/31 0958) SpO2:  [95 %-100 %] 95 % (07/31 0958) Weight:  [124.3 kg] 124.3 kg (07/30 1230)  Intake/Output from previous day:  Intake/Output Summary (Last 24 hours) at 01/31/2020 1041 Last data filed at 01/31/2020 0850 Gross per 24 hour  Intake 2208.72 ml  Output 2950 ml  Net -741.28 ml     Intake/Output this shift: Total I/O In: -  Out: 400 [Urine:400]  Labs: Recent Labs    01/31/20 0324  HGB 12.6*   Recent Labs    01/31/20 0324  HCT 38.1*   Recent Labs    01/31/20 0324  NA 137  K 3.7  CL 104  CO2 24  BUN 12  CREATININE 0.68  GLUCOSE 144*  CALCIUM 8.6*   No results for input(s): LABPT, INR in the last 72 hours.  Exam: General - Patient is Alert and Oriented Extremity - Neurologically intact Neurovascular intact Sensation intact distally Intact pulses distally Dressing - dressing C/D/I, changed to Aquacel today. Motor Function - intact, moving wrist and fingers well on exam.   Past Medical History:  Diagnosis Date  . Aortic root  dilatation (HCC)    41 mm by CT 2008, 36 mm by echo 2011  . Arthritis   . Coronary atherosclerosis of native coronary artery    Multiple stents - RCA/LAD/diagonal, overlapping DES LAD, stent thrombosis 2005, ultimately CABG  . Essential hypertension   . H/O hiatal hernia   . History of GI bleed    On nonsteroidals  . Hyperlipemia   . Myocardial infarction [facility])    Anterior with LAD stent thrombosis 2005  . Obesity   . Paroxysmal atrial flutter ([place])   . Peripheral vascular disease ([place])   . Sleep apnea    CPAP daily    Assessment/Plan: 1 Day Post-Op Procedure(s) (LRB): REVERSE SHOULDER ARTHROPLASTY (Right) Active Problems:   S/P shoulder replacement, right  Estimated body mass index is 41.66 kg/m as calculated from the following:   Height as of this encounter: 5\' 8"  (1.727 m).   Weight as of this encounter: 124.3 kg. Advance diet Up with therapy D/C IV fluids  DVT Prophylaxis - Aspirin Remain in sling Aquacel dressing  Plan is to go Home after hospital stay. Mr. Coward reports a sensation of shortness of breath when moving in bed and somewhat during ambulation, but not at rest. His vital signs show pulse of 67 and O2 sat of 95% on room air. Hemoglobin stable at 12.6. He otherwise has no concerning symptoms, including chest pain, cough.  He is talking with me comfortably today. I discussed with Dr. Lyla Glassing and Dr. Veverly Fells, and they feel it may be attributed to the nerve block.  I discussed with Mr. Messimer and his wife, and offered keep him overnight for continued monitoring. He wishes to go home today as previously planned, and we discussed that if he begins having chest pain, worsening shortness of breath, or other symptoms he needs to return to the emergency room. He or his wife may call our on call line at any point with questions or concerns.   Follow up in the office with Dr. Veverly Fells as previously scheduled.   Griffith Citron, PA-C Orthopedic Surgery [phone] 01/31/2020, 10:41 AM

## 2020-02-03 DIAGNOSIS — Z96611 Presence of right artificial shoulder joint: Secondary | ICD-10-CM | POA: Diagnosis not present

## 2020-02-03 DIAGNOSIS — R931 Abnormal findings on diagnostic imaging of heart and coronary circulation: Secondary | ICD-10-CM | POA: Diagnosis not present

## 2020-02-03 DIAGNOSIS — Z951 Presence of aortocoronary bypass graft: Secondary | ICD-10-CM | POA: Diagnosis not present

## 2020-02-03 DIAGNOSIS — R0602 Shortness of breath: Secondary | ICD-10-CM | POA: Diagnosis not present

## 2020-02-03 DIAGNOSIS — R918 Other nonspecific abnormal finding of lung field: Secondary | ICD-10-CM | POA: Diagnosis not present

## 2020-02-03 DIAGNOSIS — Z9889 Other specified postprocedural states: Secondary | ICD-10-CM | POA: Diagnosis not present

## 2020-02-03 DIAGNOSIS — R0600 Dyspnea, unspecified: Secondary | ICD-10-CM | POA: Diagnosis not present

## 2020-02-03 DIAGNOSIS — K449 Diaphragmatic hernia without obstruction or gangrene: Secondary | ICD-10-CM | POA: Diagnosis not present

## 2020-02-04 ENCOUNTER — Encounter (HOSPITAL_COMMUNITY): Payer: Self-pay | Admitting: Orthopedic Surgery

## 2020-02-04 DIAGNOSIS — R918 Other nonspecific abnormal finding of lung field: Secondary | ICD-10-CM | POA: Diagnosis not present

## 2020-02-04 DIAGNOSIS — R7989 Other specified abnormal findings of blood chemistry: Secondary | ICD-10-CM | POA: Diagnosis not present

## 2020-02-04 DIAGNOSIS — R0600 Dyspnea, unspecified: Secondary | ICD-10-CM | POA: Diagnosis not present

## 2020-02-04 DIAGNOSIS — R0602 Shortness of breath: Secondary | ICD-10-CM | POA: Diagnosis not present

## 2020-02-04 DIAGNOSIS — Z96611 Presence of right artificial shoulder joint: Secondary | ICD-10-CM | POA: Diagnosis not present

## 2020-02-12 DIAGNOSIS — Z4789 Encounter for other orthopedic aftercare: Secondary | ICD-10-CM | POA: Diagnosis not present

## 2020-02-17 NOTE — Discharge Summary (Signed)
Orthopedic Discharge Summary        Physician Discharge Summary  Patient ID: [name] MRN: [id number] DOB/AGE: [birth year] 69 y.o.  Admit date: 01/30/2020 Discharge date: 02/17/2020   Procedures:  Procedure(s) (LRB): REVERSE SHOULDER ARTHROPLASTY (Right)  Attending Physician:  Dr. Esmond Plants  Admission Diagnoses:   Right shoulder cuff arthropathy  Discharge Diagnoses:  Right shoulder cuff arthropathy   Past Medical History:  Diagnosis Date  . Aortic root dilatation (HCC)    41 mm by CT 2008, 36 mm by echo 2011  . Arthritis   . Coronary atherosclerosis of native coronary artery    Multiple stents - RCA/LAD/diagonal, overlapping DES LAD, stent thrombosis 2005, ultimately CABG  . Essential hypertension   . H/O hiatal hernia   . History of GI bleed    On nonsteroidals  . Hyperlipemia   . Myocardial infarction [facility])    Anterior with LAD stent thrombosis 2005  . Obesity   . Paroxysmal atrial flutter ([place])   . Peripheral vascular disease ([place])   . Sleep apnea    CPAP daily    PCP: Rory Percy, MD   Discharged Condition: good  Hospital Course:  Patient underwent the above stated procedure on 01/30/2020. Patient tolerated the procedure well and brought to the recovery room in good condition and subsequently to the floor. Patient had an uncomplicated hospital course and was stable for discharge.   Disposition: Discharge disposition: 01-Home or Self Care      with follow up in 2 weeks    Follow-up Information    Netta Cedars, MD.   Specialty: Orthopedic Surgery Why: [phone] Contact information: [address] [place] [address] [phone]                 Allergies as of 01/31/2020      Reactions   Chlorhexidine Itching   Pt. States on last admission CHG wipes made him itch and it had to be washed off.  Uncertain if benadryl was given.      Medication List    TAKE these medications   aspirin 325 MG  tablet Take 325 mg by mouth at bedtime. What changed: Another medication with the same name was removed. Continue taking this medication, and follow the directions you see here.   atorvastatin 20 MG tablet Commonly known as: LIPITOR TAKE 1 TABLET ONCE DAILY. What changed: when to take this   b complex vitamins tablet Take 1 tablet by mouth daily.   Cialis 2.5 MG Tabs Generic drug: Tadalafil Take 2.5 mg by mouth at bedtime.   Co Q-10 100 MG Caps Take 100 mg by mouth daily.   cyclobenzaprine 10 MG tablet Commonly known as: FLEXERIL Take 1 tablet (10 mg total) by mouth 3 (three) times daily as needed for muscle spasms. What changed: when to take this   EPINEPHrine 0.3 mg/0.3 mL Soaj injection Commonly known as: EPI-PEN Inject 0.3 mLs into the skin as needed for anaphylaxis.   EYE VITAMINS PO Take 1 capsule by mouth daily.   losartan 100 MG tablet Commonly known as: COZAAR Take 100 mg by mouth daily.   losartan 25 MG tablet Commonly known as: COZAAR Take 1 tablet (25 mg total) by mouth daily.   metoprolol tartrate 50 MG tablet Commonly known as: LOPRESSOR Take 50 mg by mouth 2 (two) times daily.   naproxen 500 MG tablet Commonly known as: NAPROSYN Take 500 mg by mouth 2 (two) times daily with a meal.  OLIVE LEAF EXTRACT PO Take 750 mg by mouth daily.   Omega 3 500 500 MG Caps Take 500 mg by mouth daily. What changed: Another medication with the same name was removed. Continue taking this medication, and follow the directions you see here.   OSTEO BI-FLEX REGULAR STRENGTH PO Take 2 tablets by mouth daily.   oxyCODONE-acetaminophen 5-325 MG tablet Commonly known as: Percocet Take 1 tablet by mouth every 6 (six) hours as needed for severe pain.   POMEGRANATE PO Take 2 capsules by mouth daily.   Turmeric 500 MG Tabs Take 500 mg by mouth 2 (two) times daily.   Vitamin D 125 MCG (5000 UT) Caps Take 5,000 Units by mouth daily.         Signed: Ventura Bruns 02/17/2020, 8:07 AM  [facility] is now [employer] [address] [place], [place], [place] [address] Phone: [place]

## 2020-02-27 DIAGNOSIS — E559 Vitamin D deficiency, unspecified: Secondary | ICD-10-CM | POA: Diagnosis not present

## 2020-02-27 DIAGNOSIS — M159 Polyosteoarthritis, unspecified: Secondary | ICD-10-CM | POA: Diagnosis not present

## 2020-02-27 DIAGNOSIS — I2510 Atherosclerotic heart disease of native coronary artery without angina pectoris: Secondary | ICD-10-CM | POA: Diagnosis not present

## 2020-02-27 DIAGNOSIS — R5382 Chronic fatigue, unspecified: Secondary | ICD-10-CM | POA: Diagnosis not present

## 2020-02-27 DIAGNOSIS — D519 Vitamin B12 deficiency anemia, unspecified: Secondary | ICD-10-CM | POA: Diagnosis not present

## 2020-02-27 DIAGNOSIS — G4733 Obstructive sleep apnea (adult) (pediatric): Secondary | ICD-10-CM | POA: Diagnosis not present

## 2020-02-27 DIAGNOSIS — E7800 Pure hypercholesterolemia, unspecified: Secondary | ICD-10-CM | POA: Diagnosis not present

## 2020-02-27 DIAGNOSIS — I739 Peripheral vascular disease, unspecified: Secondary | ICD-10-CM | POA: Diagnosis not present

## 2020-02-27 DIAGNOSIS — E291 Testicular hypofunction: Secondary | ICD-10-CM | POA: Diagnosis not present

## 2020-02-27 DIAGNOSIS — I10 Essential (primary) hypertension: Secondary | ICD-10-CM | POA: Diagnosis not present

## 2020-02-27 DIAGNOSIS — E1165 Type 2 diabetes mellitus with hyperglycemia: Secondary | ICD-10-CM | POA: Diagnosis not present

## 2020-03-08 DIAGNOSIS — M4807 Spinal stenosis, lumbosacral region: Secondary | ICD-10-CM | POA: Diagnosis not present

## 2020-03-08 DIAGNOSIS — M159 Polyosteoarthritis, unspecified: Secondary | ICD-10-CM | POA: Diagnosis not present

## 2020-03-08 DIAGNOSIS — Z6841 Body Mass Index (BMI) 40.0 and over, adult: Secondary | ICD-10-CM | POA: Diagnosis not present

## 2020-03-10 DIAGNOSIS — Z23 Encounter for immunization: Secondary | ICD-10-CM | POA: Diagnosis not present

## 2020-03-11 DIAGNOSIS — Z4789 Encounter for other orthopedic aftercare: Secondary | ICD-10-CM | POA: Diagnosis not present

## 2020-04-13 ENCOUNTER — Ambulatory Visit: Payer: Medicare Other | Admitting: Cardiology

## 2020-04-21 DIAGNOSIS — M25311 Other instability, right shoulder: Secondary | ICD-10-CM | POA: Diagnosis not present

## 2020-04-21 DIAGNOSIS — Z4789 Encounter for other orthopedic aftercare: Secondary | ICD-10-CM | POA: Diagnosis not present

## 2020-04-27 ENCOUNTER — Encounter (HOSPITAL_COMMUNITY)
Admission: RE | Admit: 2020-04-27 | Discharge: 2020-04-27 | Disposition: A | Discharge status: Home / Self Care | Payer: Medicare Other | Source: Ambulatory Visit | Attending: Orthopedic Surgery | Admitting: Orthopedic Surgery

## 2020-04-27 ENCOUNTER — Other Ambulatory Visit: Payer: Self-pay

## 2020-04-27 ENCOUNTER — Other Ambulatory Visit (HOSPITAL_COMMUNITY)
Admission: RE | Admit: 2020-04-27 | Discharge: 2020-04-27 | Disposition: A | Discharge status: Home / Self Care | Payer: Medicare Other | Source: Ambulatory Visit | Attending: Orthopedic Surgery | Admitting: Orthopedic Surgery

## 2020-04-27 ENCOUNTER — Encounter (HOSPITAL_COMMUNITY): Payer: Self-pay

## 2020-04-27 DIAGNOSIS — Z01812 Encounter for preprocedural laboratory examination: Secondary | ICD-10-CM | POA: Insufficient documentation

## 2020-04-27 DIAGNOSIS — M25311 Other instability, right shoulder: Secondary | ICD-10-CM | POA: Diagnosis not present

## 2020-04-27 DIAGNOSIS — Z20822 Contact with and (suspected) exposure to covid-19: Secondary | ICD-10-CM | POA: Insufficient documentation

## 2020-04-27 LAB — BASIC METABOLIC PANEL
Anion gap: 11 (ref 5–15)
BUN: 18 mg/dL (ref 8–23)
CO2: 25 mmol/L (ref 22–32)
Calcium: 9.1 mg/dL (ref 8.9–10.3)
Chloride: 103 mmol/L (ref 98–111)
Creatinine, Ser: 0.91 mg/dL (ref 0.61–1.24)
GFR, Estimated: >60 mL/min (ref >60)
Glucose, Bld: 106 mg/dL — ABNORMAL HIGH (ref 70–99)
Potassium: 4.4 mmol/L (ref 3.5–5.1)
Sodium: 139 mmol/L (ref 135–145)

## 2020-04-27 LAB — CBC
HCT: 42.5 % (ref 39.0–52.0)
Hemoglobin: 14 g/dL (ref 13.0–17.0)
MCH: 30.3 pg (ref 26.0–34.0)
MCHC: 32.9 g/dL (ref 30.0–36.0)
MCV: 92 fL (ref 80.0–100.0)
Platelets: 218 10*3/uL (ref 150–400)
RBC: 4.62 MIL/uL (ref 4.22–5.81)
RDW: 13.6 % (ref 11.5–15.5)
WBC: 5.5 10*3/uL (ref 4.0–10.5)
nRBC: 0 % (ref 0.0–0.2)

## 2020-04-27 LAB — SURGICAL PCR SCREEN
MRSA, PCR: NEGATIVE
Staphylococcus aureus: NEGATIVE

## 2020-04-27 LAB — SARS CORONAVIRUS 2 (TAT 6-24 HRS): SARS Coronavirus 2: NEGATIVE

## 2020-04-27 NOTE — Progress Notes (Signed)
DUE TO COVID-19 ONLY ONE VISITOR IS ALLOWED TO COME WITH YOU AND STAY IN THE WAITING ROOM ONLY DURING PRE OP AND PROCEDURE DAY OF SURGERY. THE 1 VISITOR  MAY VISIT WITH YOU AFTER SURGERY IN YOUR PRIVATE ROOM DURING VISITING HOURS ONLY!  YOU NEED TO HAVE A COVID 19 TEST ON__10/26/2021 _____ @_______ , THIS TEST MUST BE DONE BEFORE SURGERY,  COVID TESTING SITE [address] [address], IT IS ON THE RIGHT GOING OUT [address] APPROXIMATELY  2 MINUTES PAST [employer] ON THE RIGHT. ONCE YOUR COVID TEST IS COMPLETED,  PLEASE BEGIN THE QUARANTINE INSTRUCTIONS AS OUTLINED IN YOUR HANDOUT.                [name]  04/27/2020   Your procedure is scheduled on: 04/30/2020    Report to [facility] Main  Entrance   Report to admitting at    1230pm     Call this number if you have problems the morning of surgery [phone]    REMEMBER: NO  SOLID FOOD CANDY OR GUM AFTER MIDNIGHT. CLEAR LIQUIDS UNTIL     1200noon        . NOTHING BY MOUTH EXCEPT CLEAR LIQUIDS UNTIL    . PLEASE FINISH ENSURE DRINK PER SURGEON ORDER  WHICH NEEDS TO BE COMPLETED AT  1200noon    .      CLEAR LIQUID DIET   Foods Allowed                                                                    Coffee and tea, regular and decaf                            Fruit ices (not with fruit pulp)                                      Iced Popsicles                                    Carbonated beverages, regular and diet                                    Cranberry, grape and apple juices Sports drinks like Gatorade Lightly seasoned clear broth or consume(fat free) Sugar, honey syrup ___________________________________________________________________      BRUSH YOUR TEETH MORNING OF SURGERY AND RINSE YOUR MOUTH OUT, NO CHEWING GUM CANDY OR MINTS.     Take these medicines the morning of surgery with A SIP OF WATER: Metoprolol                                  You may not have any  metal on your body including hair pins and              piercings  Do not wear jewelry, make-up, lotions, powders or perfumes, deodorant  Do not wear nail polish on your fingernails.  Do not shave  48 hours prior to surgery.              Men may shave face and neck.   Do not bring valuables to the hospital. [place].  Contacts, dentures or bridgework may not be worn into surgery.  Leave suitcase in the car. After surgery it may be brought to your room.     Patients discharged the day of surgery will not be allowed to drive home. IF YOU ARE HAVING SURGERY AND GOING HOME THE SAME DAY, YOU MUST HAVE AN ADULT TO DRIVE YOU HOME AND BE WITH YOU FOR 24 HOURS. YOU MAY GO HOME BY TAXI OR UBER OR ORTHERWISE, BUT AN ADULT MUST ACCOMPANY YOU HOME AND STAY WITH YOU FOR 24 HOURS.  Name and phone number of your driver:  Special Instructions: N/A              Please read over the following fact sheets you were given: _____________________________________________________________________  [facility] - Preparing for Surgery Before surgery, you can play an important role.  Because skin is not sterile, your skin needs to be as free of germs as possible.  You can reduce the number of germs on your skin by washing with CHG (chlorahexidine gluconate) soap before surgery.  CHG is an antiseptic cleaner which kills germs and bonds with the skin to continue killing germs even after washing. Please DO NOT use if you have an allergy to CHG or antibacterial soaps.  If your skin becomes reddened/irritated stop using the CHG and inform your nurse when you arrive at Short Stay. Do not shave (including legs and underarms) for at least 48 hours prior to the first CHG shower.  You may shave your face/neck. Please follow these instructions carefully:  1.  Shower with CHG Soap the night before surgery and the  morning of Surgery.  2.  If you choose to wash your hair,  wash your hair first as usual with your  normal  shampoo.  3.  After you shampoo, rinse your hair and body thoroughly to remove the  shampoo.                           4.  Use CHG as you would any other liquid soap.  You can apply chg directly  to the skin and wash                       Gently with a scrungie or clean washcloth.  5.  Apply the CHG Soap to your body ONLY FROM THE NECK DOWN.   Do not use on face/ open                           Wound or open sores. Avoid contact with eyes, ears mouth and genitals (private parts).                       Wash face,  Genitals (private parts) with your normal soap.             6.  Wash thoroughly, paying special attention to the area where your surgery  will be performed.  7.  Thoroughly rinse your body with  warm water from the neck down.  8.  DO NOT shower/wash with your normal soap after using and rinsing off  the CHG Soap.                9.  Pat yourself dry with a clean towel.            10.  Wear clean pajamas.            11.  Place clean sheets on your bed the night of your first shower and do not  sleep with pets. Day of Surgery : Do not apply any lotions/deodorants the morning of surgery.  Please wear clean clothes to the hospital/surgery center.  FAILURE TO FOLLOW THESE INSTRUCTIONS MAY RESULT IN THE CANCELLATION OF YOUR SURGERY PATIENT SIGNATURE_________________________________  NURSE SIGNATURE__________________________________  ________________________________________________________________________

## 2020-04-27 NOTE — H&P (Signed)
Patient's anticipated LOS is less than 2 midnights, meeting these requirements: - Younger than 71 - Lives within 1 hour of care - Has a competent adult at home to recover with post-op recover - NO history of  - Chronic pain requiring opiods  - Diabetes  - Coronary Artery Disease  - Heart failure  - Heart attack  - Stroke  - DVT/VTE  - Cardiac arrhythmia  - Respiratory Failure/COPD  - Renal failure  - Anemia  - Advanced Liver disease       [name] is an 69 y.o. male.    Chief Complaint: right shoulder dislocation  HPI: Pt is a 69 y.o. male complaining of right shoulder dislocation s/p reverse total shoulder Pain had continually increased since the beginning. X-rays in the clinic show right reverse total shoulder. Various options are discussed with the patient. Risks, benefits and expectations were discussed with the patient. Patient understand the risks, benefits and expectations and wishes to proceed with surgery.   PCP:  Rory Percy, MD  D/C Plans: Home  PMH: Past Medical History:  Diagnosis Date  . Aortic root dilatation (HCC)    41 mm by CT 2008, 36 mm by echo 2011  . Arthritis   . Coronary atherosclerosis of native coronary artery    Multiple stents - RCA/LAD/diagonal, overlapping DES LAD, stent thrombosis 2005, ultimately CABG  . Essential hypertension   . H/O hiatal hernia   . History of GI bleed    On nonsteroidals  . Hyperlipemia   . Myocardial infarction [facility])    Anterior with LAD stent thrombosis 2005  . Obesity   . Paroxysmal atrial flutter ([place])   . Peripheral vascular disease ([place])   . Sleep apnea    CPAP daily    PSH: Past Surgical History:  Procedure Laterality Date  . APPENDECTOMY    . BREATH TEK H PYLORI  06/11/2012   Procedure: BREATH TEK H PYLORI;  Surgeon: Shann Medal, MD;  Location: [facility];  Service: General;  Laterality: N/A;  Seth Bake  . CARDIAC SURGERY    . CARPAL TUNNEL RELEASE Right 6/12  . CARPAL TUNNEL  RELEASE  06/06/2011   Procedure: CARPAL TUNNEL RELEASE;  Surgeon: Cammie Sickle., MD;  Location: [place];  Service: Orthopedics;  Laterality: Right;  . CARPAL TUNNEL RELEASE Right 04/10/2013   Procedure: REVISION OF CARPAL TUNNEL RELEASE LIGAMENT RIGHT WRIST;  Surgeon: Cammie Sickle., MD;  Location: [place];  Service: Orthopedics;  Laterality: Right;  . CERVICAL FUSION  9/10  . CHOLECYSTECTOMY    . CORONARY ANGIOPLASTY WITH STENT PLACEMENT  [id number]  . CORONARY ARTERY BYPASS GRAFT N/A 12/30/2013   Procedure: CORONARY ARTERY BYPASS GRAFTING (CABG) x three, using left internal mammary artery and right leg greater sapheneous vein harvested endoscopically - LIMA to LAD, SVG-D, SVG-OM1;  Surgeon: Ivin Poot, MD;  Location: [place];  Service: Open Heart Surgery;  Laterality: N/A;  . ELBOW ARTHROSCOPY Right   . LAPAROSCOPIC GASTRIC BANDING  3/14  . LEFT HEART CATHETERIZATION WITH CORONARY ANGIOGRAM N/A 12/26/2013   Procedure: LEFT HEART CATHETERIZATION WITH CORONARY ANGIOGRAM;  Surgeon: Leonie Man, MD;  Location: [facility];  Service: Cardiovascular;  Laterality: N/A;  . REVERSE SHOULDER ARTHROPLASTY Right 01/30/2020   Procedure: REVERSE SHOULDER ARTHROPLASTY;  Surgeon: Netta Cedars, MD;  [facility];  Service: Orthopedics;  Laterality: Right;  interscalene block  . SHOULDER ARTHROSCOPY Right 4/11  . TOTAL KNEE ARTHROPLASTY  Right 2006  . TOTAL KNEE ARTHROPLASTY Left 2007  . ULNAR NERVE TRANSPOSITION  06/06/2011   Procedure: ULNAR NERVE DECOMPRESSION/TRANSPOSITION;  Surgeon: Cammie Sickle., MD;  Location: [place];  Service: Orthopedics;  Laterality: Right;  decompression ulnar nerve right cubital tunnel     Social History:  reports that he has never smoked. He quit smokeless tobacco use about 16 years ago.  His smokeless tobacco use included chew. He reports current alcohol use of about 1.0 standard drink of alcohol per  week. He reports that he does not use drugs.  Allergies:  Allergies  Allergen Reactions  . Chlorhexidine Itching    Pt. States on last admission CHG wipes made him itch and it had to be washed off.  Uncertain if benadryl was given.    Medications: No current facility-administered medications for this encounter.   Current Outpatient Medications  Medication Sig Dispense Refill  . aspirin 81 MG EC tablet Take 81 mg by mouth at bedtime.     Marland Kitchen atorvastatin (LIPITOR) 20 MG tablet TAKE 1 TABLET ONCE DAILY. (Patient taking differently: Take 20 mg by mouth every evening. ) 30 tablet 6  . b complex vitamins tablet Take 1 tablet by mouth daily.    . Cholecalciferol (VITAMIN D) 125 MCG (5000 UT) CAPS Take 5,000 Units by mouth daily.     . Coenzyme Q10 (CO Q-10) 100 MG CAPS Take 100 mg by mouth daily.     . cyclobenzaprine (FLEXERIL) 10 MG tablet Take 1 tablet (10 mg total) by mouth 3 (three) times daily as needed for muscle spasms. 30 tablet 0  . EPINEPHrine 0.3 mg/0.3 mL IJ SOAJ injection Inject 0.3 mLs into the skin as needed for anaphylaxis.     Marland Kitchen losartan (COZAAR) 100 MG tablet Take 100 mg by mouth daily.    . metoprolol (LOPRESSOR) 50 MG tablet Take 50 mg by mouth 2 (two) times daily.    . naproxen (NAPROSYN) 500 MG tablet Take 500 mg by mouth 2 (two) times daily with a meal.     . Tadalafil (CIALIS) 2.5 MG TABS Take 2.5 mg by mouth at bedtime.     . Glucosamine-Chondroitin (OSTEO BI-FLEX REGULAR STRENGTH PO) Take 2 tablets by mouth daily. (Patient not taking: Reported on 04/26/2020)    . losartan (COZAAR) 25 MG tablet Take 1 tablet (25 mg total) by mouth daily. (Patient not taking: Reported on 01/08/2020) 90 tablet 3  . Multiple Vitamins-Minerals (EYE VITAMINS PO) Take 1 capsule by mouth daily. (Patient not taking: Reported on 04/26/2020)    . OLIVE LEAF EXTRACT PO Take 750 mg by mouth daily. (Patient not taking: Reported on 04/26/2020)    . Omega-3 Fatty Acids (OMEGA 3 500) 500 MG CAPS Take 500  mg by mouth daily. (Patient not taking: Reported on 04/26/2020)    . oxyCODONE-acetaminophen (PERCOCET) 5-325 MG tablet Take 1 tablet by mouth every 6 (six) hours as needed for severe pain. (Patient not taking: Reported on 04/26/2020) 20 tablet 0  . Pomegranate, Punica granatum, (POMEGRANATE PO) Take 2 capsules by mouth daily.  (Patient not taking: Reported on 04/26/2020)    . Turmeric 500 MG TABS Take 500 mg by mouth 2 (two) times daily.  (Patient not taking: Reported on 04/26/2020)      No results found for this or any previous visit (from the past 48 hour(s)). No results found.  ROS: Pain with rom of the right upper extremity  Physical Exam: Alert and oriented 69  y.o. male in no acute distress Cranial nerves 2-12 intact Cervical spine: full rom with no tenderness, nv intact distally Chest: active breath sounds bilaterally, no wheeze rhonchi or rales Heart: regular rate and rhythm, no murmur Abd: non tender non distended with active bowel sounds Hip is stable with rom  Right shoulder with guarded rom nv intact distally No rashes or edema distally  Assessment/Plan Assessment: right shoulder dislocation s/p reverse total shoulder  Plan:  Patient will undergo a right reverse total shoulder revision by Dr. Veverly Fells at [facility] Risks benefits and expectations were discussed with the patient. Patient understand risks, benefits and expectations and wishes to proceed. Preoperative templating of the joint replacement has been completed, documented, and submitted to the Operating Room personnel in order to optimize intra-operative equipment management.   Merla Riches PA-C, MPAS [facility] is now [employer] [address] [place], [place], [place] [address] Phone: [phone] [email or web address] Facebook  [employer]

## 2020-04-28 ENCOUNTER — Encounter (HOSPITAL_COMMUNITY): Payer: Self-pay

## 2020-04-28 NOTE — Progress Notes (Signed)
Anesthesia Review:  [facility]  Cardiologist : DR Domenic Polite  Chest x-ray : EKG :08/19/2019  Echo :2019  Stress test: 10/16/2019  Cardiac Cath :  Activity level: can do a flight of stiars without difficulty  Sleep Study/ CPAP : Fasting Blood Sugar :      / Checks Blood Sugar -- times a day:   Blood Thinner/ Instructions /Last Dose: ASA / Instructions/ Last Dose : 81 mg Aspirin  01/30/2020- right shoulder arthroplasty  Clearance for 01/2020 surgery 10/08/2019 - Bernerd Pho, [facility] cardiology - [place]

## 2020-04-29 MED ORDER — DEXTROSE 5 % IV SOLN
3.0000 g | INTRAVENOUS | Status: AC
  Administered 2020-04-30: 3 g via INTRAVENOUS
  Filled 2020-04-29: qty 3

## 2020-04-29 NOTE — Anesthesia Preprocedure Evaluation (Addendum)
Anesthesia Evaluation  Patient identified by MRN, date of birth, ID band Patient awake    Reviewed: Patient's Chart, lab work & pertinent test results  Airway Mallampati: IV  TM Distance: >3 FB Neck ROM: Full    Dental  (+) Teeth Intact   Pulmonary sleep apnea and Continuous Positive Airway Pressure Ventilation ,    Pulmonary exam normal        Cardiovascular hypertension, + CAD, + Past MI, + Cardiac Stents and + CABG  + dysrhythmias Atrial Fibrillation  Rhythm:Regular Rate:Normal     Neuro/Psych negative neurological ROS  negative psych ROS   GI/Hepatic Neg liver ROS, hiatal hernia,   Endo/Other  negative endocrine ROS  Renal/GU negative Renal ROS  negative genitourinary   Musculoskeletal  (+) Arthritis ,   Abdominal (+)  Abdomen: soft. Bowel sounds: normal.  Peds  Hematology negative hematology ROS (+)   Anesthesia Other Findings   Reproductive/Obstetrics                           Anesthesia Physical Anesthesia Plan  ASA: III  Anesthesia Plan: General   Post-op Pain Management:    Induction: Intravenous and Rapid sequence  PONV Risk Score and Plan: 2 and Ondansetron, Dexamethasone, Midazolam and Treatment may vary due to age or medical condition  Airway Management Planned: Mask and Oral ETT  Additional Equipment: None  Intra-op Plan:   Post-operative Plan: Extubation in OR  Informed Consent: I have reviewed the patients History and Physical, chart, labs and discussed the procedure including the risks, benefits and alternatives for the proposed anesthesia with the patient or authorized representative who has indicated his/her understanding and acceptance.     Dental advisory given  Plan Discussed with:   Anesthesia Plan Comments: (NOTE: Patient reports failed block with last procedure and phrenic nerve palsy and difficulty breathing for weeks after procedure and not  interested in block at this time for procedure.  Stress test 04/21: Nuclear stress test 10/16/19:   No diagnostic ST segment changes to indicate ischemia.  Small size, moderate intensity, partially reversible defect in the mid to apical inferior and predominantly mid inferolateral wall. This is consistent with scar and a mild region of peri-infarct ischemia.  This is a low risk study.  Nuclear stress EF: 57%. ECHO 01/19: Echo 08/02/17:  - Left ventricle: The cavity size was normal. Wall thickness was normal. Systolic function was normal. The estimated ejection fraction was in the range of 55% to 60%. Wall motion was normal; there were no regional wall motion abnormalities. Features are consistent with a pseudonormal left ventricular filling pattern, with concomitant abnormal relaxation and increased filling pressure (grade 2 diastolic dysfunction). Indeterminate filling pressures.  - Aortic valve: Moderately calcified annulus. Mildly thickened, mildly calcified leaflets. Sclerosis without stenosis. There was mild regurgitation.  - Aorta: Mild aortic root dilatation.  - Atrial septum: No defect or patent foramen ovale was identified. There was an atrial septal aneurysm.  - Pulmonic valve: There was mild regurgitation. )       Anesthesia Quick Evaluation

## 2020-04-29 NOTE — Progress Notes (Signed)
Anesthesia Chart Review:   Case: [id number] Date/Time: 04/30/20 1445   Procedure: Right shoulder poly exchange and possible glensphere exchange (Right Shoulder)   Anesthesia type: Choice   Pre-op diagnosis: Right shoulder instability after reverse total shoulder   [facility]   Surgeons: Netta Cedars, MD      DISCUSSION:  Pt is a 69 year old with hx CAD (s/p multiple priors stents; s/p CABG x3 in 2015), PAF, HTN, aortic root dilatation, OSA,   S/p R shoulder replacement 01/30/20. Pt had cardiac clearance for this procedure.    VS: BP (!) 173/76   Pulse (!) 59   Temp 36.9 C (Oral)   Resp 18   Ht 5\' 8"  (1.727 m)   Wt 124.5 kg   SpO2 97%   BMI 41.74 kg/m    PROVIDERS: PCP is Rory Percy, MD Cardiologist is Rozann Lesches, MD. Last office visit 10/08/19 with Bernerd Pho, [place].    LABS: Labs reviewed: Acceptable for surgery. (all labs ordered are listed, but only abnormal results are displayed)  Labs Reviewed  BASIC METABOLIC PANEL - Abnormal; Notable for the following components:      Result Value   Glucose, Bld 106 (*)    All other components within normal limits  SURGICAL PCR SCREEN  CBC     IMAGES: CXR 02/03/20 (care everywhere):  1. Opacity in the retrocardiac space likely reflecting some atelectatic changes adjacent to a recurrent hiatal hernia despite the presence of a lap band. Underlying consolidation is not excluded.  2. Evidence of prior sternotomy and CABG with discontiguous sternal sutures which is new from comparison.    EKG 08/19/19: Sinus  Rhythm. -First degree A-V block, PRi = 234. Poor R-wave progression.    CV: Nuclear stress test 10/16/19:   No diagnostic ST segment changes to indicate ischemia.  Small size, moderate intensity, partially reversible defect in the mid to apical inferior and predominantly mid inferolateral wall. This is consistent with scar and a mild region of peri-infarct ischemia.  This is a low risk  study.  Nuclear stress EF: 57%.   Echo 08/02/17:  - Left ventricle: The cavity size was normal. Wall thickness was normal. Systolic function was normal. The estimated ejection fraction was in the range of 55% to 60%. Wall motion was normal; there were no regional wall motion abnormalities. Features are consistent with a pseudonormal left ventricular filling pattern, with concomitant abnormal relaxation and increased filling pressure (grade 2 diastolic dysfunction). Indeterminate filling pressures.  - Aortic valve: Moderately calcified annulus. Mildly thickened, mildly calcified leaflets. Sclerosis without stenosis. There was mild regurgitation.  - Aorta: Mild aortic root dilatation.  - Atrial septum: No defect or patent foramen ovale was identified. There was an atrial septal aneurysm.  - Pulmonic valve: There was mild regurgitation.     Past Medical History:  Diagnosis Date  . Aortic root dilatation (HCC)    41 mm by CT 2008, 36 mm by echo 2011  . Arthritis   . Coronary atherosclerosis of native coronary artery    Multiple stents - RCA/LAD/diagonal, overlapping DES LAD, stent thrombosis 2005, ultimately CABG  . Essential hypertension   . H/O hiatal hernia   . History of GI bleed    On nonsteroidals  . Hyperlipemia   . Myocardial infarction [facility])    Anterior with LAD stent thrombosis 2005, 2004 and 2005   . Obesity   . Paroxysmal atrial flutter ([place])   . Sleep apnea  CPAP daily    Past Surgical History:  Procedure Laterality Date  . APPENDECTOMY    . BREATH TEK H PYLORI  06/11/2012   Procedure: BREATH TEK H PYLORI;  Surgeon: Shann Medal, MD;  Location: [facility];  Service: General;  Laterality: N/A;  Seth Bake  . CARDIAC SURGERY    . CARPAL TUNNEL RELEASE Right 6/12  . CARPAL TUNNEL RELEASE  06/06/2011   Procedure: CARPAL TUNNEL RELEASE;  Surgeon: Cammie Sickle., MD;  Location: [place];  Service: Orthopedics;  Laterality: Right;  . CARPAL TUNNEL  RELEASE Right 04/10/2013   Procedure: REVISION OF CARPAL TUNNEL RELEASE LIGAMENT RIGHT WRIST;  Surgeon: Cammie Sickle., MD;  Location: [place];  Service: Orthopedics;  Laterality: Right;  . CERVICAL FUSION  9/10  . CHOLECYSTECTOMY    . CORONARY ANGIOPLASTY WITH STENT PLACEMENT  [id number]  . CORONARY ARTERY BYPASS GRAFT N/A 12/30/2013   Procedure: CORONARY ARTERY BYPASS GRAFTING (CABG) x three, using left internal mammary artery and right leg greater sapheneous vein harvested endoscopically - LIMA to LAD, SVG-D, SVG-OM1;  Surgeon: Ivin Poot, MD;  Location: [place];  Service: Open Heart Surgery;  Laterality: N/A;  . ELBOW ARTHROSCOPY Right   . LAPAROSCOPIC GASTRIC BANDING  3/14  . LEFT HEART CATHETERIZATION WITH CORONARY ANGIOGRAM N/A 12/26/2013   Procedure: LEFT HEART CATHETERIZATION WITH CORONARY ANGIOGRAM;  Surgeon: Leonie Man, MD;  Location: [facility] CATH LAB;  Service: Cardiovascular;  Laterality: N/A;  . REVERSE SHOULDER ARTHROPLASTY Right 01/30/2020   Procedure: REVERSE SHOULDER ARTHROPLASTY;  Surgeon: Netta Cedars, MD;  [facility];  Service: Orthopedics;  Laterality: Right;  interscalene block  . SHOULDER ARTHROSCOPY Right 4/11  . TOTAL KNEE ARTHROPLASTY Right 2006  . TOTAL KNEE ARTHROPLASTY Left 2007  . ULNAR NERVE TRANSPOSITION  06/06/2011   Procedure: ULNAR NERVE DECOMPRESSION/TRANSPOSITION;  Surgeon: Cammie Sickle., MD;  Location: [place];  Service: Orthopedics;  Laterality: Right;  decompression ulnar nerve right cubital tunnel     MEDICATIONS: . aspirin 81 MG EC tablet  . atorvastatin (LIPITOR) 20 MG tablet  . b complex vitamins tablet  . Cholecalciferol (VITAMIN D) 125 MCG (5000 UT) CAPS  . Coenzyme Q10 (CO Q-10) 100 MG CAPS  . cyclobenzaprine (FLEXERIL) 10 MG tablet  . EPINEPHrine 0.3 mg/0.3 mL IJ SOAJ injection  . Glucosamine-Chondroitin (OSTEO BI-FLEX REGULAR STRENGTH PO)  . losartan (COZAAR) 100 MG tablet  . losartan  (COZAAR) 25 MG tablet  . metoprolol (LOPRESSOR) 50 MG tablet  . Multiple Vitamins-Minerals (EYE VITAMINS PO)  . naproxen (NAPROSYN) 500 MG tablet  . OLIVE LEAF EXTRACT PO  . Omega-3 Fatty Acids (OMEGA 3 500) 500 MG CAPS  . oxyCODONE-acetaminophen (PERCOCET) 5-325 MG tablet  . Pomegranate, Punica granatum, (POMEGRANATE PO)  . Tadalafil (CIALIS) 2.5 MG TABS  . Turmeric 500 MG TABS   No current facility-administered medications for this encounter.   Derrill Memo ON 04/30/2020] ceFAZolin (ANCEF) 3 g in dextrose 5 % 50 mL IVPB    If no changes, I anticipate pt can proceed with surgery as scheduled.   Willeen Cass, PhD, FNP-BC [facility] Short Stay Surgical Center/Anesthesiology Phone: [phone] 04/29/2020 10:34 AM

## 2020-04-30 ENCOUNTER — Ambulatory Visit (HOSPITAL_COMMUNITY)
Admission: RE | Admit: 2020-04-30 | Discharge: 2020-04-30 | Disposition: A | Discharge status: Home / Self Care | Payer: Medicare Other | Attending: Orthopedic Surgery | Admitting: Orthopedic Surgery

## 2020-04-30 ENCOUNTER — Encounter (HOSPITAL_COMMUNITY)
Admission: RE | Disposition: A | Discharge status: Home / Self Care | Payer: Self-pay | Source: Home / Self Care | Attending: Orthopedic Surgery

## 2020-04-30 ENCOUNTER — Ambulatory Visit (HOSPITAL_COMMUNITY): Payer: Medicare Other | Admitting: Emergency Medicine

## 2020-04-30 ENCOUNTER — Ambulatory Visit (HOSPITAL_COMMUNITY): Payer: Medicare Other | Admitting: Anesthesiology

## 2020-04-30 ENCOUNTER — Encounter (HOSPITAL_COMMUNITY): Payer: Self-pay | Admitting: Orthopedic Surgery

## 2020-04-30 DIAGNOSIS — E782 Mixed hyperlipidemia: Secondary | ICD-10-CM | POA: Diagnosis not present

## 2020-04-30 DIAGNOSIS — Z87891 Personal history of nicotine dependence: Secondary | ICD-10-CM | POA: Insufficient documentation

## 2020-04-30 DIAGNOSIS — M25311 Other instability, right shoulder: Secondary | ICD-10-CM | POA: Diagnosis not present

## 2020-04-30 DIAGNOSIS — M25512 Pain in left shoulder: Secondary | ICD-10-CM | POA: Diagnosis not present

## 2020-04-30 DIAGNOSIS — Z888 Allergy status to other drugs, medicaments and biological substances status: Secondary | ICD-10-CM | POA: Insufficient documentation

## 2020-04-30 DIAGNOSIS — I2510 Atherosclerotic heart disease of native coronary artery without angina pectoris: Secondary | ICD-10-CM | POA: Diagnosis not present

## 2020-04-30 DIAGNOSIS — I10 Essential (primary) hypertension: Secondary | ICD-10-CM | POA: Diagnosis not present

## 2020-04-30 DIAGNOSIS — Z96611 Presence of right artificial shoulder joint: Secondary | ICD-10-CM | POA: Diagnosis not present

## 2020-04-30 DIAGNOSIS — T84028A Dislocation of other internal joint prosthesis, initial encounter: Secondary | ICD-10-CM | POA: Diagnosis not present

## 2020-04-30 HISTORY — PX: IRRIGATION AND DEBRIDEMENT SHOULDER: SHX5880

## 2020-04-30 SURGERY — IRRIGATION AND DEBRIDEMENT SHOULDER
Anesthesia: General | Site: Shoulder | Laterality: Right

## 2020-04-30 MED ORDER — SUCCINYLCHOLINE CHLORIDE 200 MG/10ML IV SOSY
PREFILLED_SYRINGE | INTRAVENOUS | Status: AC
  Filled 2020-04-30: qty 10

## 2020-04-30 MED ORDER — CHLORHEXIDINE GLUCONATE 0.12 % MT SOLN: 15.0000 mL | Freq: Once | OROMUCOSAL | Status: AC

## 2020-04-30 MED ORDER — ONDANSETRON HCL 4 MG/2ML IJ SOLN
INTRAMUSCULAR | Status: DC | PRN
  Administered 2020-04-30: 4 mg via INTRAVENOUS

## 2020-04-30 MED ORDER — TRIAMCINOLONE ACETONIDE 40 MG/ML IJ SUSP
INTRAMUSCULAR | Status: DC | PRN
  Administered 2020-04-30: 40 mg via INTRA_ARTICULAR

## 2020-04-30 MED ORDER — FENTANYL CITRATE (PF) 250 MCG/5ML IJ SOLN
INTRAMUSCULAR | Status: AC
  Filled 2020-04-30: qty 5

## 2020-04-30 MED ORDER — BUPIVACAINE-EPINEPHRINE (PF) 0.25% -1:200000 IJ SOLN
INTRAMUSCULAR | Status: DC | PRN
  Administered 2020-04-30: 4 mL
  Administered 2020-04-30: 26 mL

## 2020-04-30 MED ORDER — BUPIVACAINE-EPINEPHRINE (PF) 0.25% -1:200000 IJ SOLN
INTRAMUSCULAR | Status: AC
  Filled 2020-04-30: qty 30

## 2020-04-30 MED ORDER — ORAL CARE MOUTH RINSE
15.0000 mL | Freq: Once | OROMUCOSAL | Status: AC
  Administered 2020-04-30: 15 mL via OROMUCOSAL

## 2020-04-30 MED ORDER — MIDAZOLAM HCL 5 MG/5ML IJ SOLN
INTRAMUSCULAR | Status: DC | PRN
  Administered 2020-04-30: 2 mg via INTRAVENOUS

## 2020-04-30 MED ORDER — ONDANSETRON HCL 4 MG/2ML IJ SOLN
INTRAMUSCULAR | Status: AC
  Filled 2020-04-30: qty 2

## 2020-04-30 MED ORDER — LACTATED RINGERS IV SOLN: INTRAVENOUS | Status: DC

## 2020-04-30 MED ORDER — ROCURONIUM BROMIDE 10 MG/ML (PF) SYRINGE
PREFILLED_SYRINGE | INTRAVENOUS | Status: AC
  Filled 2020-04-30: qty 10

## 2020-04-30 MED ORDER — OXYCODONE-ACETAMINOPHEN 5-325 MG PO TABS: 1.0000 | ORAL_TABLET | Freq: Four times a day (QID) | ORAL | 0 refills | Status: DC | PRN

## 2020-04-30 MED ORDER — ONDANSETRON HCL 4 MG/2ML IJ SOLN: 4.0000 mg | Freq: Once | INTRAMUSCULAR | Status: DC | PRN

## 2020-04-30 MED ORDER — PHENYLEPHRINE HCL-NACL 10-0.9 MG/250ML-% IV SOLN
INTRAVENOUS | Status: DC | PRN
  Administered 2020-04-30: 35 ug/min via INTRAVENOUS

## 2020-04-30 MED ORDER — LIDOCAINE 2% (20 MG/ML) 5 ML SYRINGE
INTRAMUSCULAR | Status: AC
  Filled 2020-04-30: qty 5

## 2020-04-30 MED ORDER — PHENYLEPHRINE 40 MCG/ML (10ML) SYRINGE FOR IV PUSH (FOR BLOOD PRESSURE SUPPORT)
PREFILLED_SYRINGE | INTRAVENOUS | Status: AC
  Filled 2020-04-30: qty 20

## 2020-04-30 MED ORDER — EPHEDRINE 5 MG/ML INJ
INTRAVENOUS | Status: AC
  Filled 2020-04-30: qty 20

## 2020-04-30 MED ORDER — STERILE WATER FOR IRRIGATION IR SOLN
Status: DC | PRN
  Administered 2020-04-30: 1000 mL

## 2020-04-30 MED ORDER — OXYCODONE HCL 5 MG PO TABS
ORAL_TABLET | ORAL | Status: AC
  Filled 2020-04-30: qty 1

## 2020-04-30 MED ORDER — PROPOFOL 10 MG/ML IV BOLUS
INTRAVENOUS | Status: AC
  Filled 2020-04-30: qty 20

## 2020-04-30 MED ORDER — MIDAZOLAM HCL 2 MG/2ML IJ SOLN: 1.0000 mg | INTRAMUSCULAR | Status: DC

## 2020-04-30 MED ORDER — FENTANYL CITRATE (PF) 100 MCG/2ML IJ SOLN
INTRAMUSCULAR | Status: DC | PRN
  Administered 2020-04-30: 50 ug via INTRAVENOUS
  Administered 2020-04-30: 100 ug via INTRAVENOUS
  Administered 2020-04-30 (×2): 50 ug via INTRAVENOUS

## 2020-04-30 MED ORDER — PROPOFOL 10 MG/ML IV BOLUS
INTRAVENOUS | Status: DC | PRN
  Administered 2020-04-30: 50 mg via INTRAVENOUS
  Administered 2020-04-30: 200 mg via INTRAVENOUS
  Administered 2020-04-30: 50 mg via INTRAVENOUS

## 2020-04-30 MED ORDER — PHENYLEPHRINE 40 MCG/ML (10ML) SYRINGE FOR IV PUSH (FOR BLOOD PRESSURE SUPPORT)
PREFILLED_SYRINGE | INTRAVENOUS | Status: AC
  Filled 2020-04-30: qty 10

## 2020-04-30 MED ORDER — TRIAMCINOLONE ACETONIDE 40 MG/ML IJ SUSP
INTRAMUSCULAR | Status: AC
  Filled 2020-04-30: qty 1

## 2020-04-30 MED ORDER — LIDOCAINE 2% (20 MG/ML) 5 ML SYRINGE
INTRAMUSCULAR | Status: DC | PRN
  Administered 2020-04-30: 100 mg via INTRAVENOUS

## 2020-04-30 MED ORDER — OXYCODONE HCL 5 MG PO TABS
5.0000 mg | ORAL_TABLET | Freq: Once | ORAL | Status: AC | PRN
  Administered 2020-04-30: 5 mg via ORAL

## 2020-04-30 MED ORDER — 0.9 % SODIUM CHLORIDE (POUR BTL) OPTIME
TOPICAL | Status: DC | PRN
  Administered 2020-04-30: 1000 mL

## 2020-04-30 MED ORDER — SUCCINYLCHOLINE CHLORIDE 200 MG/10ML IV SOSY
PREFILLED_SYRINGE | INTRAVENOUS | Status: DC | PRN
  Administered 2020-04-30: 140 mg via INTRAVENOUS

## 2020-04-30 MED ORDER — METHOCARBAMOL 500 MG PO TABS: 500.0000 mg | ORAL_TABLET | Freq: Three times a day (TID) | ORAL | 1 refills | Status: DC | PRN

## 2020-04-30 MED ORDER — DEXAMETHASONE SODIUM PHOSPHATE 10 MG/ML IJ SOLN
INTRAMUSCULAR | Status: AC
  Filled 2020-04-30: qty 1

## 2020-04-30 MED ORDER — FENTANYL CITRATE (PF) 100 MCG/2ML IJ SOLN
INTRAMUSCULAR | Status: AC
  Filled 2020-04-30: qty 2

## 2020-04-30 MED ORDER — OXYCODONE HCL 5 MG/5ML PO SOLN: 5.0000 mg | Freq: Once | ORAL | Status: AC | PRN

## 2020-04-30 MED ORDER — PHENYLEPHRINE 40 MCG/ML (10ML) SYRINGE FOR IV PUSH (FOR BLOOD PRESSURE SUPPORT)
PREFILLED_SYRINGE | INTRAVENOUS | Status: DC | PRN
  Administered 2020-04-30 (×2): 80 ug via INTRAVENOUS

## 2020-04-30 MED ORDER — FENTANYL CITRATE (PF) 100 MCG/2ML IJ SOLN: 50.0000 ug | INTRAMUSCULAR | Status: DC

## 2020-04-30 MED ORDER — MIDAZOLAM HCL 2 MG/2ML IJ SOLN
INTRAMUSCULAR | Status: AC
  Filled 2020-04-30: qty 2

## 2020-04-30 MED ORDER — FENTANYL CITRATE (PF) 100 MCG/2ML IJ SOLN
25.0000 ug | INTRAMUSCULAR | Status: DC | PRN
  Administered 2020-04-30 (×2): 50 ug via INTRAVENOUS

## 2020-04-30 MED ORDER — CEFAZOLIN SODIUM-DEXTROSE 2-4 GM/100ML-% IV SOLN
INTRAVENOUS | Status: AC
  Filled 2020-04-30: qty 200

## 2020-04-30 MED ORDER — DEXAMETHASONE SODIUM PHOSPHATE 10 MG/ML IJ SOLN
INTRAMUSCULAR | Status: DC | PRN
  Administered 2020-04-30: 10 mg via INTRAVENOUS

## 2020-04-30 SURGICAL SUPPLY — 49 items
COVER SURGICAL LIGHT HANDLE (MISCELLANEOUS) ×2 IMPLANT
COVER WAND RF STERILE (DRAPES) ×2 IMPLANT
CUP HUMERAL 42 PLUS 3 (Orthopedic Implant) ×1 IMPLANT
DRAPE STERI 35X30 U-POUCH (DRAPES) ×2 IMPLANT
DRAPE U-SHAPE 47X51 STRL (DRAPES) ×2 IMPLANT
DRSG ADAPTIC 3X8 NADH LF (GAUZE/BANDAGES/DRESSINGS) ×1 IMPLANT
DRSG PAD ABDOMINAL 8X10 ST (GAUZE/BANDAGES/DRESSINGS) ×3 IMPLANT
DURAPREP 26ML APPLICATOR (WOUND CARE) ×2 IMPLANT
ELECT REM PT RETURN 15FT ADLT (MISCELLANEOUS) IMPLANT
EVACUATOR 1/8 PVC DRAIN (DRAIN) ×2 IMPLANT
GAUZE SPONGE 4X4 12PLY STRL (GAUZE/BANDAGES/DRESSINGS) ×2 IMPLANT
GLOVE BIOGEL PI ORTHO PRO 7.5 (GLOVE) ×1
GLOVE BIOGEL PI ORTHO PRO SZ8 (GLOVE) ×1
GLOVE ORTHO TXT STRL SZ7.5 (GLOVE) ×2 IMPLANT
GLOVE PI ORTHO PRO STRL 7.5 (GLOVE) ×1 IMPLANT
GLOVE PI ORTHO PRO STRL SZ8 (GLOVE) ×1 IMPLANT
GLOVE SURG ORTHO 8.5 STRL (GLOVE) ×2 IMPLANT
GOWN STRL REUS W/ TWL LRG LVL3 (GOWN DISPOSABLE) ×1 IMPLANT
GOWN STRL REUS W/ TWL XL LVL3 (GOWN DISPOSABLE) ×4 IMPLANT
GOWN STRL REUS W/TWL LRG LVL3 (GOWN DISPOSABLE) ×2
GOWN STRL REUS W/TWL XL LVL3 (GOWN DISPOSABLE) ×8
HANDPIECE INTERPULSE COAX TIP (DISPOSABLE)
HUMERAL SPACER (Trauma) ×2 IMPLANT
KIT BASIN OR (CUSTOM PROCEDURE TRAY) ×2 IMPLANT
KIT TURNOVER KIT A (KITS) ×2 IMPLANT
MANIFOLD NEPTUNE II (INSTRUMENTS) ×2 IMPLANT
NS IRRIG 1000ML POUR BTL (IV SOLUTION) ×2 IMPLANT
PACK SHOULDER (CUSTOM PROCEDURE TRAY) ×2 IMPLANT
PAD ARMBOARD 7.5X6 YLW CONV (MISCELLANEOUS) ×4 IMPLANT
PENCIL SMOKE EVACUATOR (MISCELLANEOUS) IMPLANT
SET HNDPC FAN SPRY TIP SCT (DISPOSABLE) IMPLANT
SLING ARM FOAM STRAP XLG (SOFTGOODS) ×1 IMPLANT
SPACER HUMERAL (Trauma) IMPLANT
SPONGE LAP 18X18 RF (DISPOSABLE) ×2 IMPLANT
SUT FIBERWIRE #2 38 T-5 BLUE (SUTURE)
SUT MNCRL AB 3-0 PS2 18 (SUTURE) ×2 IMPLANT
SUT VIC AB 0 CT1 27 (SUTURE) ×2
SUT VIC AB 0 CT1 27XBRD ANBCTR (SUTURE) ×1 IMPLANT
SUT VIC AB 2-0 CT1 27 (SUTURE) ×2
SUT VIC AB 2-0 CT1 TAPERPNT 27 (SUTURE) ×1 IMPLANT
SUTURE FIBERWR #2 38 T-5 BLUE (SUTURE) IMPLANT
SWAB COLLECTION DEVICE MRSA (MISCELLANEOUS) ×2 IMPLANT
SWAB CULTURE ESWAB REG 1ML (MISCELLANEOUS) IMPLANT
TAPE CLOTH SURG 4X10 WHT LF (GAUZE/BANDAGES/DRESSINGS) ×1 IMPLANT
TOWEL OR 17X26 10 PK STRL BLUE (TOWEL DISPOSABLE) ×4 IMPLANT
TUBING CONNECTING 10 (TUBING) ×2 IMPLANT
UNDERPAD 30X36 HEAVY ABSORB (UNDERPADS AND DIAPERS) ×2 IMPLANT
WATER STERILE IRR 1000ML POUR (IV SOLUTION) ×2 IMPLANT
YANKAUER SUCT BULB TIP NO VENT (SUCTIONS) ×2 IMPLANT

## 2020-04-30 NOTE — Discharge Instructions (Signed)
Ice to the shoulder constantly.  Keep the incision covered and clean and dry for one week, then ok to get it wet in the shower. Keep your arm propped forward across your waist DO NOT reach behind your back or push up out of a chair with the operative arm.  Use a sling while you are up and around for comfort, may remove while seated.  Keep pillow propped behind the operative elbow.  Follow up with Dr Veverly Fells in two weeks in the office, call [phone] for appt

## 2020-04-30 NOTE — Brief Op Note (Signed)
04/30/2020  3:44 PM  PATIENT:  [name]  69 y.o. male  PRE-OPERATIVE DIAGNOSIS:  Right shoulder instability after reverse total shoulder, left shoulder pain  POST-OPERATIVE DIAGNOSIS:  Right shoulder instability after reverse total shoulder, left shoulder pain  PROCEDURE:  Right shoulder polyethylene exchange, Left shoulder subacromial cortisone injection  SURGEON:  Surgeon(s) and Role:    Netta Cedars, MD - Primary  PHYSICIAN ASSISTANT:   ASSISTANTS: [place] Bruns, PA-C   ANESTHESIA:   local and general  EBL:  50 mL   BLOOD ADMINISTERED:none  DRAINS: none   LOCAL MEDICATIONS USED:  MARCAINE     SPECIMEN:  No Specimen  DISPOSITION OF SPECIMEN:  N/A  COUNTS:  YES  TOURNIQUET:  * No tourniquets in log *  DICTATION: .Other Dictation: Dictation Number [id number]  PLAN OF CARE: Discharge to home after PACU  PATIENT DISPOSITION:  PACU - hemodynamically stable.   Delay start of Pharmacological VTE agent (>24hrs) due to surgical blood loss or risk of bleeding: not applicable

## 2020-04-30 NOTE — Op Note (Signed)
NAME: [name], ZANDERS MEDICAL RECORD PX:[id number] ACCOUNT [id number] DATE OF BIRTH:03/26/1951 FACILITY[facility] LOCATION: [facility] PHYSICIAN:STEVEN Orlena Sheldon, MD  OPERATIVE REPORT  DATE OF PROCEDURE:  04/30/2020  PREOPERATIVE DIAGNOSES:   1.  Right shoulder instability following reverse shoulder placement. 2.  Left shoulder pain.  POSTOPERATIVE DIAGNOSES:   1.  Right shoulder instability following reverse shoulder placement.  2.   Left shoulder pain.  PROCEDURE PERFORMED:   1.  Right reverse total shoulder replacement, polyethylene exchange. 2.  Left shoulder subacromial cortisone injection.   3.  We also did deep cultures, right shoulder.  ATTENDING SURGEON:  Esmond Plants, MD  ASSISTANT:  Darol Destine, [place], who was scrubbed during the entire procedure and necessary for satisfactory completion of surgery.  ANESTHESIA:  General anesthesia was used, plus local anesthesia.  ESTIMATED BLOOD LOSS:  Minimal.  FLUID REPLACEMENT:  1000 mL crystalloid.  INSTRUMENT COUNTS:  Correct.  COMPLICATIONS:  No complications.  ANTIBIOTICS:  Perioperative antibiotics were given.  INDICATIONS:  The patient is a 69 year old male who had a reverse shoulder replacement performed earlier this year and the patient had done well initially, but complained recently of a couple episodes of subluxation of his shoulder when he would relax  his shoulder in a certain position.  The last was about a month ago.  He has not had any instability episodes since that time, but due to concerns for potential dislocation, we recommended polyethylene exchange and tightening up the reverse shoulder  replacement on the right.  The patient also requested for left shoulder injection while he was asleep.  Risks were discussed with the patient.  Informed consent obtained.  DESCRIPTION OF PROCEDURE:  After an adequate level of anesthesia was achieved, the patient was positioned in modified beach chair position.   Left shoulder correctly identified, timeout called.  We injected the left shoulder with 1 mL of Kenalog 40 mg and  5 mL of 0.25% Marcaine with epinephrine.  We then went ahead and placed the patient in the modified beach chair position for the right shoulder surgery.  Sterile prep and drape performed of the right shoulder.  Time-out called, verifying correct  patient, correct site.  We infiltrated the skin with 0.25% Marcaine with epinephrine and then made our skin incision with a 10 blade scalpel through the patient's prior deltopectoral incision, dissection down through subcutaneous tissues using Bovie.  We  identified the deltopectoral interval deep and divided that.  We identified the conjoined tendon, protecting the conjoined tendon and the coracoid process.  We then pulled on the arm distally and noted there to be a little gap between the humeral tray  and the glenosphere.  We then dislocated the shoulder, removed the 42+3 polyethylene and I did take some deep cultures of the effusion that was present in the shoulder.  This looked clear and there was certainly nothing that looked like infection, but I  did go ahead and send deep cultures, both aerobic and anaerobic cultures.  We went ahead and trialed with initially a +9, 42+9 poly.  We placed that poly in the tray and reduced the shoulder.  We were happy with soft tissue balancing.  We went ahead and  removed the trial and then selected the +9 shim, which is a metal shim to be applied to the humeral tray and then a 42+3 poly.  We impacted the stem into position.  We went ahead and impacted the 42+3 poly and reduced the shoulder.  We were happy with  soft tissue balancing.  No gap with inferior pole.  Conjoined appropriately tensioned and felt like the stability was dramatically improved.  We irrigated thoroughly and closed the deltopectoral interval with 0 Vicryl suture, followed by 2-0 Vicryl for  subcutaneous closure and staples for skin.  Sterile  bandage was applied and shoulder sling.  The patient transported to recovery room in stable condition, having tolerated surgery well.  VN/NUANCE  D:04/30/2020 T:04/30/2020 JOB:[id number]

## 2020-04-30 NOTE — Transfer of Care (Signed)
Immediate Anesthesia Transfer of Care Note  Patient: [name]  Procedure(s) Performed: Right shoulder poly exchange and possible glensphere exchange and left shoulder injection (Right Shoulder)  Patient Location: PACU  Anesthesia Type:General  Level of Consciousness: awake, alert  and oriented  Airway & Oxygen Therapy: Patient Spontanous Breathing and Patient connected to face mask oxygen  Post-op Assessment: Report given to RN and Post -op Vital signs reviewed and stable  Post vital signs: Reviewed and stable  Last Vitals:  Vitals Value Taken Time  BP 164/79 04/30/20 1600  Temp    Pulse 64 04/30/20 1601  Resp 20 04/30/20 1601  SpO2 100 % 04/30/20 1601  Vitals shown include unvalidated device data.  Last Pain:  Vitals:   04/30/20 1230  TempSrc:   PainSc: 0-No pain         Complications: No complications documented.

## 2020-04-30 NOTE — Anesthesia Procedure Notes (Signed)
Procedure Name: Intubation Date/Time: 04/30/2020 2:52 PM Performed by: Maxwell Caul, CRNA Pre-anesthesia Checklist: Patient identified, Emergency Drugs available, Suction available and Patient being monitored Patient Re-evaluated:Patient Re-evaluated prior to induction Oxygen Delivery Method: Circle system utilized Preoxygenation: Pre-oxygenation with 100% oxygen Induction Type: IV induction and Rapid sequence Laryngoscope Size: Mac and 4 Grade View: Grade I Tube type: Oral Tube size: 7.5 mm Number of attempts: 1 Airway Equipment and Method: Stylet Placement Confirmation: ETT inserted through vocal cords under direct vision,  positive ETCO2 and breath sounds checked- equal and bilateral Secured at: 22 cm Tube secured with: Tape Dental Injury: Teeth and Oropharynx as per pre-operative assessment

## 2020-05-01 DIAGNOSIS — I2510 Atherosclerotic heart disease of native coronary artery without angina pectoris: Secondary | ICD-10-CM | POA: Diagnosis not present

## 2020-05-01 DIAGNOSIS — M1990 Unspecified osteoarthritis, unspecified site: Secondary | ICD-10-CM | POA: Diagnosis not present

## 2020-05-01 DIAGNOSIS — I10 Essential (primary) hypertension: Secondary | ICD-10-CM | POA: Diagnosis not present

## 2020-05-03 ENCOUNTER — Ambulatory Visit: Payer: Medicare Other | Admitting: Family Medicine

## 2020-05-03 ENCOUNTER — Encounter (HOSPITAL_COMMUNITY): Payer: Self-pay | Admitting: Orthopedic Surgery

## 2020-05-03 NOTE — Anesthesia Postprocedure Evaluation (Signed)
Anesthesia Post Note  Patient: [name]  Procedure(s) Performed: Right shoulder poly exchange and possible glensphere exchange and left shoulder injection (Right Shoulder)     Patient location during evaluation: PACU Anesthesia Type: General Level of consciousness: awake and alert Pain management: pain level controlled Vital Signs Assessment: post-procedure vital signs reviewed and stable Respiratory status: spontaneous breathing, nonlabored ventilation, respiratory function stable and patient connected to nasal cannula oxygen Cardiovascular status: blood pressure returned to baseline and stable Postop Assessment: no apparent nausea or vomiting Anesthetic complications: no   No complications documented.  Last Vitals:  Vitals:   04/30/20 1645 04/30/20 1700  BP: (!) 147/75 (!) 166/81  Pulse: (!) 58 63  Resp: 10 16  Temp: 36.9 C 36.9 C  SpO2: 95% 95%    Last Pain:  Vitals:   04/30/20 1700  TempSrc:   PainSc: 4                  [name]

## 2020-05-05 LAB — AEROBIC/ANAEROBIC CULTURE W GRAM STAIN (SURGICAL/DEEP WOUND): Culture: NO GROWTH

## 2020-05-06 IMAGING — RF DG UGI W/ KUB
11 series · 14 of 24 positions shown · non-contrast
Comparison: Preop upper GI 06/04/2012

CLINICAL DATA: Regurgitation of food.  Laparoscopic banding.

EXAM:
UPPER GI SERIES WITH KUB
TECHNIQUE: After obtaining a scout radiograph a routine upper GI series was
performed using thin barium
FLUOROSCOPY TIME:  Fluoroscopy Time:  2 minutes 6 second
Radiation Exposure Index (if provided by the fluoroscopic device):
Number of Acquired Spot Images: 0

[Series 1: one shot · 0.14mm/px · 1 of 1 slices shown (1 of 3)]
[im 1/1]
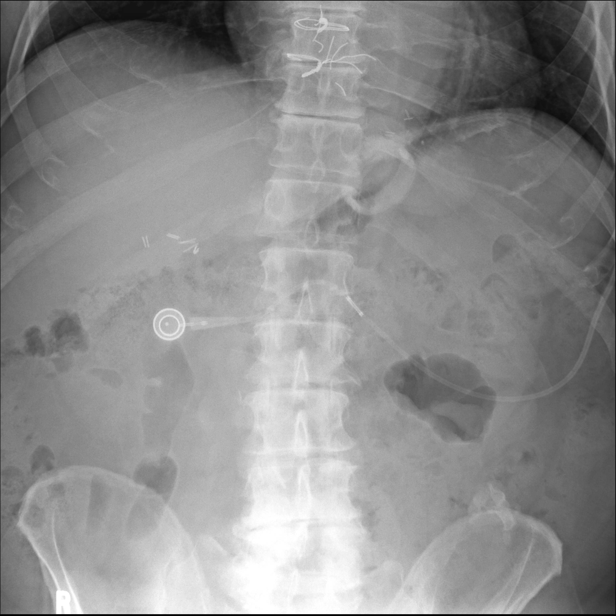

[Series 2: sequence · 0.29mm/px · 1 of 30 frames shown (1 of 8)]
[frame 16/30]
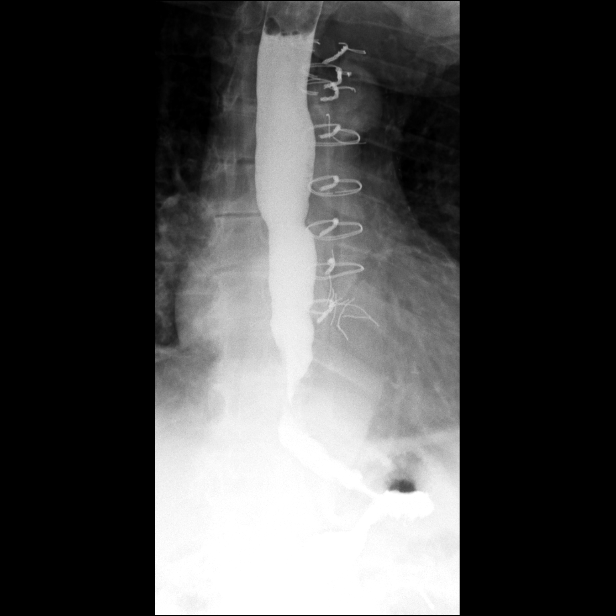

[Series 3: sequence · 0.29mm/px · 1 of 9 frames shown (2 of 8)]
[frame 5/9]
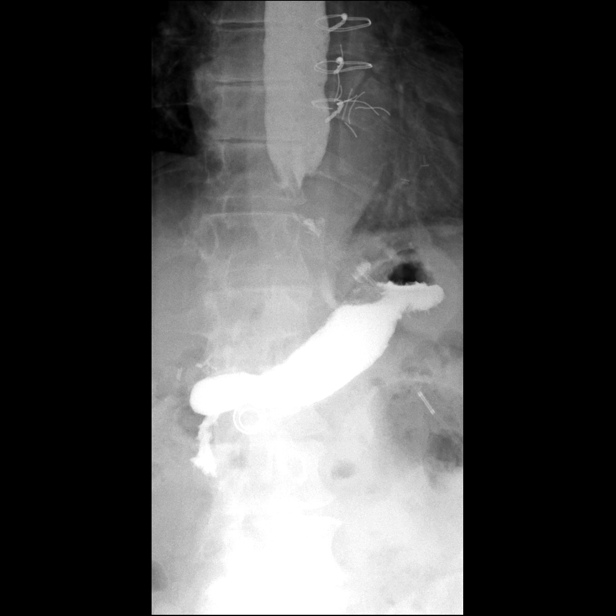

[Series 4: sequence · 0.29mm/px · 2 of 6 frames shown (3 of 8)]
[frame 1/6]
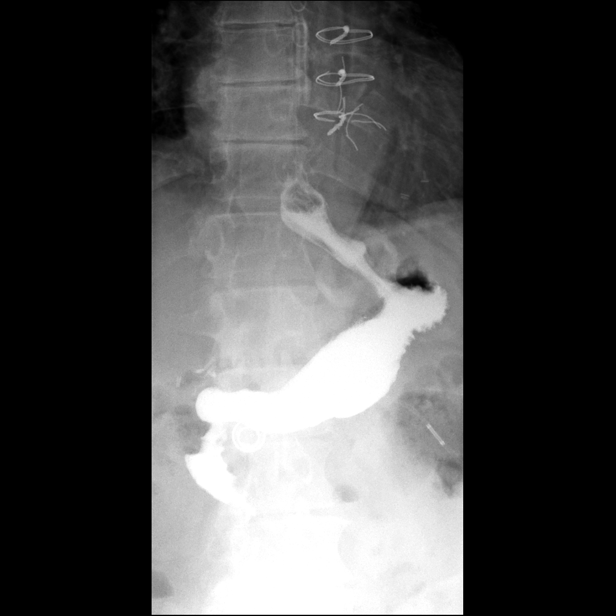
[frame 4/6]
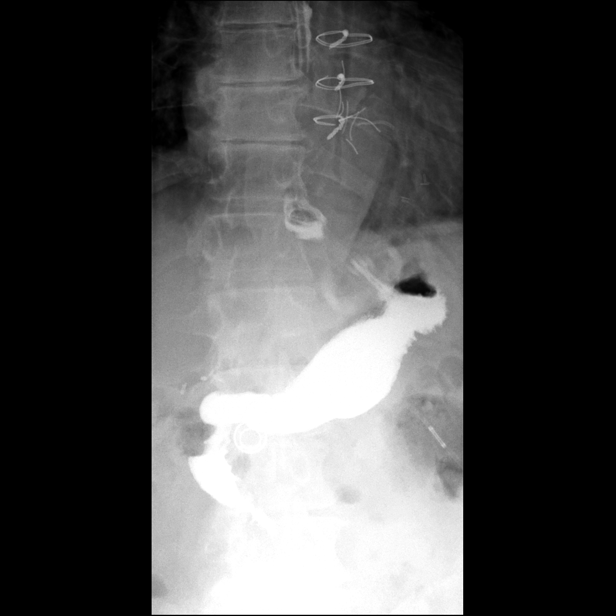

[Series 5: sequence · 0.29mm/px · 1 of 9 frames shown (4 of 8)]
[frame 2/9]
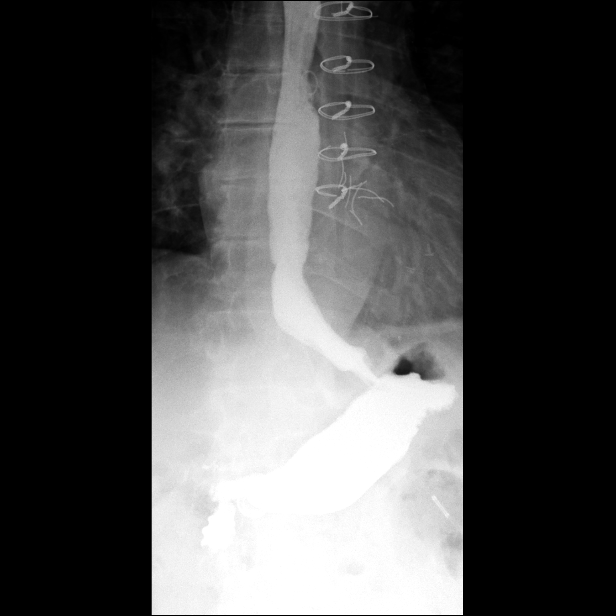

[Series 6: sequence · 0.32mm/px · 1 of 1 slices shown (5 of 8)]
[im 1/1]
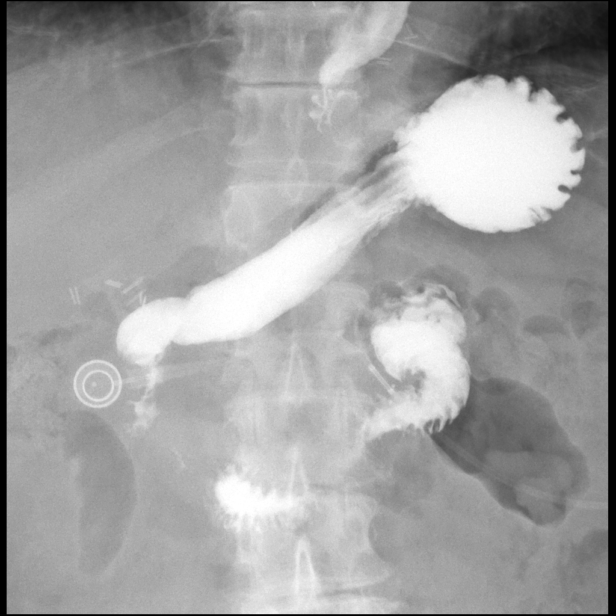

[Series 7: one shot · 0.16mm/px · 1 of 3 slices shown (2 of 3)]
[im 1/3]
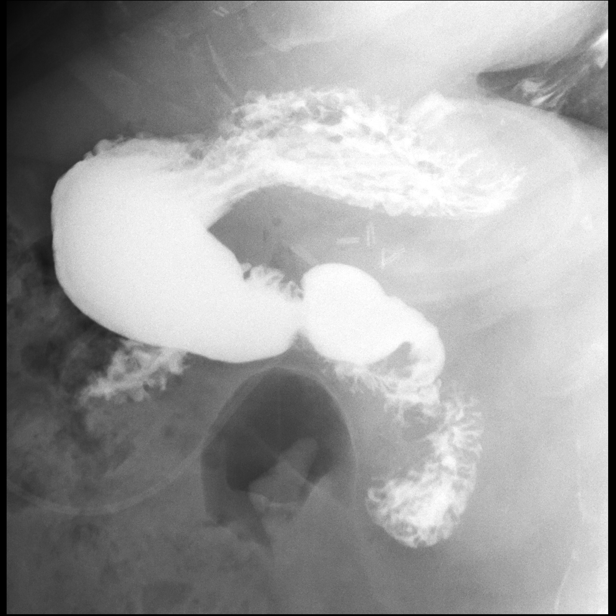

[Series 8: sequence · 0.32mm/px · 2 of 29 frames shown (6 of 8)]
[frame 5/29]
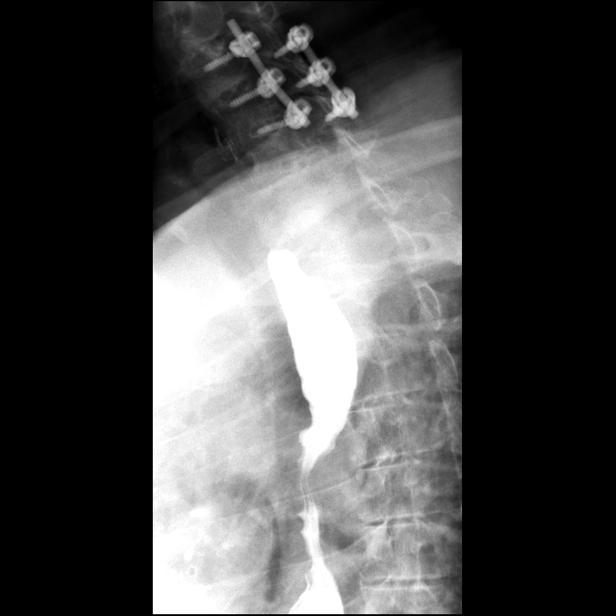
[frame 25/29]
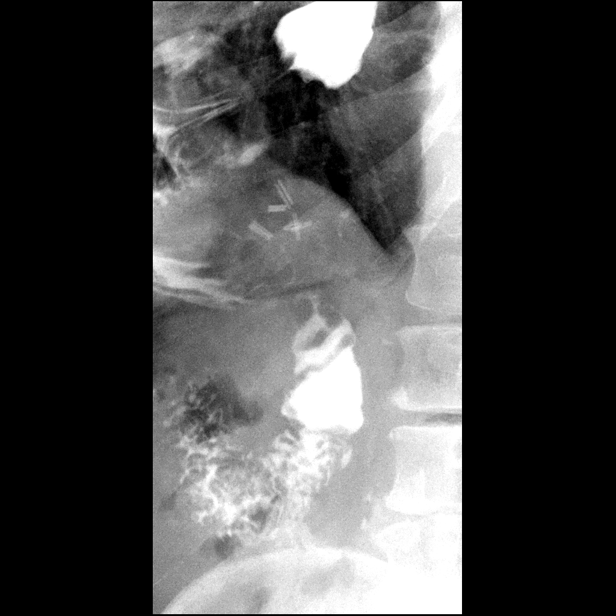

[Series 9: sequence · 0.32mm/px · 1 of 3 frames shown (7 of 8)]
[frame 3/3]
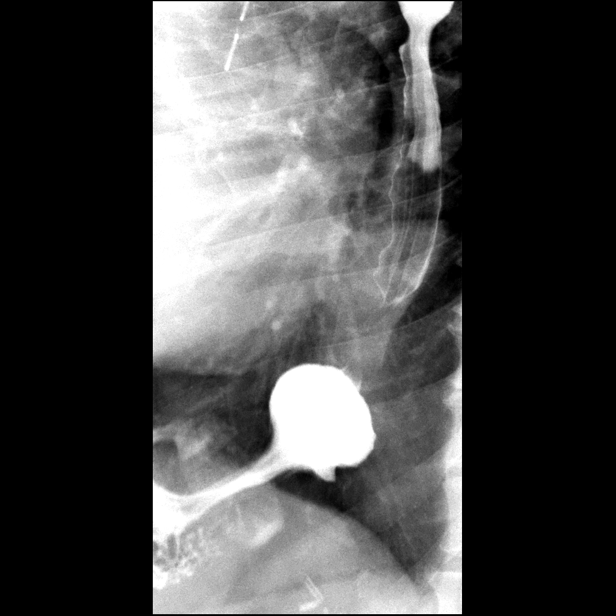

[Series 10: sequence · 0.32mm/px · 1 of 12 frames shown (8 of 8)]
[frame 3/12]
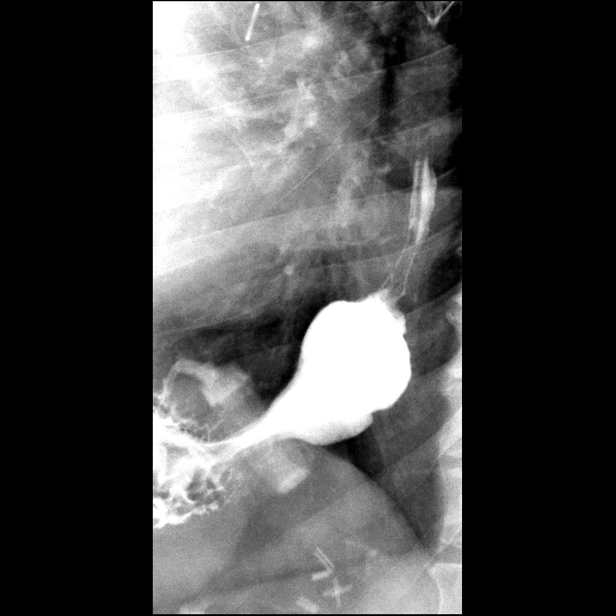

[Series 11: one shot · 2 of 4 slices shown (3 of 3)]
[im 1/4]
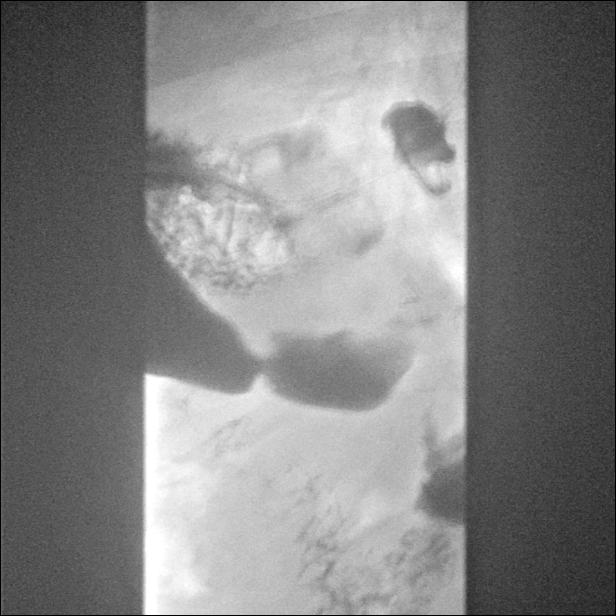
[im 4/4]
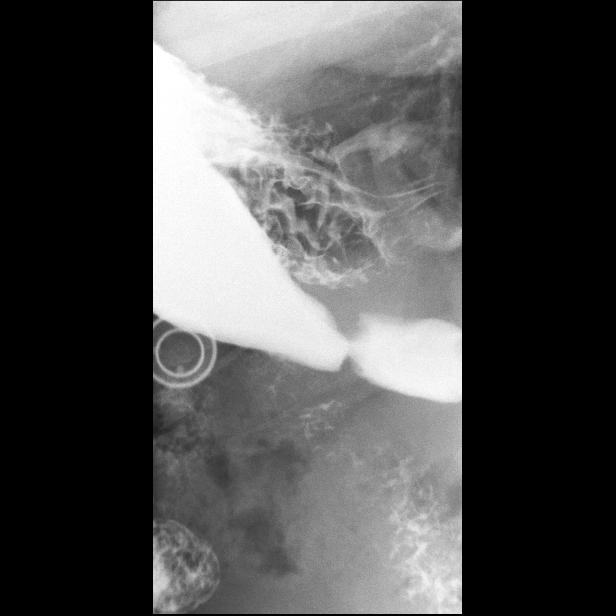

[14 of 24 positions shown; findings below may reference images not displayed]

FINDINGS: Preliminary KUB demonstrates normal bowel gas pattern. Lap band at
the 8 2 o'clock position in satisfactory position. Tubing is
continuous. Cholecystectomy clips. Lumbar degenerative changes.
CABG.

Moderate esophageal dysmotility. Esophagus is mildly dilated with
tertiary contractions. Barium passes readily into the stomach and
drains from the esophagus. No esophageal mass. Mild smooth narrowing
of the distal esophagus. Distal esophagus is dilated likely due to
chronic obstruction due to lap band. Recent removal of fluid from
the lap band. Lap band is now patent without significant
obstruction.

Gastric mucosa normal. No ulcer or mass or edema. Normal duodenal
bulb. Normal gastric emptying.
IMPRESSION: Lap band in satisfactory position and without current obstruction.
Recent removal of fluid from the lap band.

Moderate esophageal dysmotility. Dilated distal esophagus most
likely due to tight band in the past.

Negative stomach and duodenum.

## 2020-05-13 DIAGNOSIS — Z4789 Encounter for other orthopedic aftercare: Secondary | ICD-10-CM | POA: Diagnosis not present

## 2020-05-17 NOTE — Progress Notes (Signed)
Cardiology Office Note  Date: 05/18/2020   ID: [name], DOB [birth year], MRN [id number]  PCP:  Rory Percy, MD  Cardiologist:  Rozann Lesches, MD Electrophysiologist:  None   Chief Complaint  Patient presents with  . Cardiac follow-up    History of Present Illness: [name] is a 69 y.o. male last seen in April by Ms. Strader PA-C.  He presents for a follow-up visit.  Reports stable dyspnea on exertion, NYHA class II.  No obvious angina symptoms since last assessment.  Follow-up Myoview in April was low risk showing a region of mid to apical inferior/inferolateral scar with mild peri-infarct ischemia in LVEF 57%.  Plan to continue medical therapy unless symptoms escalate.  I reviewed his current medications, stable from a cardiac perspective as outlined below.  Blood pressure was reasonable today, he states that it is higher when he checks it at home sometimes and I have asked him to keep an eye on this.  We would likely either need to add a diuretic or potentially low-dose Norvasc as a next option.  I did talk with him about increasing activity and weight loss.  He had a chest CTA done at [facility] in August demonstrating a 4.5 cm ascending thoracic aortic aneurysm, also 5 mm subpleural density medially in the posterior right lung base.  63-month reimaging recommended by radiology.  Requesting interval lab work from Dr. Nadara Mustard.  Past Medical History:  Diagnosis Date  . Aortic root dilatation (HCC)    41 mm by CT 2008, 36 mm by echo 2011  . Arthritis   . Coronary atherosclerosis of native coronary artery    Multiple stents - RCA/LAD/diagonal, overlapping DES LAD, stent thrombosis 2005, ultimately CABG  . Essential hypertension   . H/O hiatal hernia   . History of GI bleed    On nonsteroidals  . Hyperlipemia   . Myocardial infarction [facility])    Anterior with LAD stent thrombosis 2005, 2004 and 2005   . Obesity   . Paroxysmal atrial flutter ([place])   .  Sleep apnea    CPAP daily    Past Surgical History:  Procedure Laterality Date  . APPENDECTOMY    . BREATH TEK H PYLORI  06/11/2012   Procedure: BREATH TEK H PYLORI;  Surgeon: Shann Medal, MD;  Location: [facility];  Service: General;  Laterality: N/A;  Seth Bake  . CARDIAC SURGERY    . CARPAL TUNNEL RELEASE Right 6/12  . CARPAL TUNNEL RELEASE  06/06/2011   Procedure: CARPAL TUNNEL RELEASE;  Surgeon: Cammie Sickle., MD;  Location: [place];  Service: Orthopedics;  Laterality: Right;  . CARPAL TUNNEL RELEASE Right 04/10/2013   Procedure: REVISION OF CARPAL TUNNEL RELEASE LIGAMENT RIGHT WRIST;  Surgeon: Cammie Sickle., MD;  Location: [place];  Service: Orthopedics;  Laterality: Right;  . CERVICAL FUSION  9/10  . CHOLECYSTECTOMY    . CORONARY ANGIOPLASTY WITH STENT PLACEMENT  [id number]  . CORONARY ARTERY BYPASS GRAFT N/A 12/30/2013   Procedure: CORONARY ARTERY BYPASS GRAFTING (CABG) x three, using left internal mammary artery and right leg greater sapheneous vein harvested endoscopically - LIMA to LAD, SVG-D, SVG-OM1;  Surgeon: Ivin Poot, MD;  Location: [facility];  Service: Open Heart Surgery;  Laterality: N/A;  . ELBOW ARTHROSCOPY Right   . IRRIGATION AND DEBRIDEMENT SHOULDER Right 04/30/2020   Procedure: Right shoulder poly exchange and possible glensphere exchange and left shoulder injection;  Surgeon: Veverly Fells,  Richardson Landry, MD;  Location: [facility];  Service: Orthopedics;  Laterality: Right;  . LAPAROSCOPIC GASTRIC BANDING  3/14  . LEFT HEART CATHETERIZATION WITH CORONARY ANGIOGRAM N/A 12/26/2013   Procedure: LEFT HEART CATHETERIZATION WITH CORONARY ANGIOGRAM;  Surgeon: Leonie Man, MD;  Location: [facility] CATH LAB;  Service: Cardiovascular;  Laterality: N/A;  . REVERSE SHOULDER ARTHROPLASTY Right 01/30/2020   Procedure: REVERSE SHOULDER ARTHROPLASTY;  Surgeon: Netta Cedars, MD;  Location: [facility];  Service: Orthopedics;  Laterality: Right;  interscalene  block  . SHOULDER ARTHROSCOPY Right 4/11  . TOTAL KNEE ARTHROPLASTY Right 2006  . TOTAL KNEE ARTHROPLASTY Left 2007  . ULNAR NERVE TRANSPOSITION  06/06/2011   Procedure: ULNAR NERVE DECOMPRESSION/TRANSPOSITION;  Surgeon: Cammie Sickle., MD;  Location: [place];  Service: Orthopedics;  Laterality: Right;  decompression ulnar nerve right cubital tunnel     Current Outpatient Medications  Medication Sig Dispense Refill  . aspirin 81 MG EC tablet Take 81 mg by mouth at bedtime.     Marland Kitchen atorvastatin (LIPITOR) 20 MG tablet TAKE 1 TABLET ONCE DAILY. (Patient taking differently: Take 20 mg by mouth every evening. ) 30 tablet 6  . b complex vitamins tablet Take 1 tablet by mouth daily.    . Cholecalciferol (VITAMIN D) 125 MCG (5000 UT) CAPS Take 5,000 Units by mouth daily.     . Coenzyme Q10 (CO Q-10) 100 MG CAPS Take 100 mg by mouth daily.     . cyclobenzaprine (FLEXERIL) 10 MG tablet Take 1 tablet (10 mg total) by mouth 3 (three) times daily as needed for muscle spasms. 30 tablet 0  . EPINEPHrine 0.3 mg/0.3 mL IJ SOAJ injection Inject 0.3 mLs into the skin as needed for anaphylaxis.     . Glucosamine-Chondroitin (OSTEO BI-FLEX REGULAR STRENGTH PO) Take 2 tablets by mouth daily.     Marland Kitchen losartan (COZAAR) 100 MG tablet Take 100 mg by mouth daily.    . methocarbamol (ROBAXIN) 500 MG tablet Take 1 tablet (500 mg total) by mouth every 8 (eight) hours as needed for muscle spasms. 40 tablet 1  . metoprolol (LOPRESSOR) 50 MG tablet Take 50 mg by mouth 2 (two) times daily.    . Multiple Vitamins-Minerals (EYE VITAMINS PO) Take 1 capsule by mouth daily.     . naproxen (NAPROSYN) 500 MG tablet Take 500 mg by mouth 2 (two) times daily with a meal.     . OLIVE LEAF EXTRACT PO Take 750 mg by mouth daily.     . Omega-3 Fatty Acids (OMEGA 3 500) 500 MG CAPS Take 500 mg by mouth daily.     . Pomegranate, Punica granatum, (POMEGRANATE PO) Take 2 capsules by mouth daily.     . Tadalafil (CIALIS) 2.5  MG TABS Take 2.5 mg by mouth at bedtime.     . Turmeric 500 MG TABS Take 500 mg by mouth 2 (two) times daily.      No current facility-administered medications for this visit.   Allergies:  Other and Chlorhexidine   ROS: No palpitations or syncope.  Physical Exam: VS:  BP 130/78   Pulse 68   Ht 5' 8.5" (1.74 m)   Wt 278 lb 12.8 oz (126.5 kg)   SpO2 97%   BMI 41.77 kg/m , BMI Body mass index is 41.77 kg/m.  Wt Readings from Last 3 Encounters:  05/18/20 278 lb 12.8 oz (126.5 kg)  04/30/20 274 lb 8 oz (124.5 kg)  04/27/20 274 lb 8 oz (124.5  kg)    General: Patient appears comfortable at rest. HEENT: Conjunctiva and lids normal, wearing a mask. Neck: Supple, no elevated JVP or carotid bruits. Lungs: Clear to auscultation, nonlabored breathing at rest. Cardiac: Regular rate and rhythm, no S3 or significant systolic murmur. Abdomen: Soft, nontender, bowel sounds present. Extremities: No pitting edema.  ECG:  An ECG dated 08/19/2019 was personally reviewed today and demonstrated:  Sinus rhythm with prolonged PR interval, poor R wave progression.  Recent Labwork: 04/27/2020: BUN 18; Creatinine, Ser 0.91; Hemoglobin 14.0; Platelets 218; Potassium 4.4; Sodium 139  August 2019: Cholesterol 166, triglycerides 77, HDL 66, LDL 85  Other Studies Reviewed Today:  Echocardiogram 08/02/2017: - Left ventricle: The cavity size was normal. Wall thickness was  normal. Systolic function was normal. The estimated ejection  fraction was in the range of 55% to 60%. Wall motion was normal;  there were no regional wall motion abnormalities. Features are  consistent with a pseudonormal left ventricular filling pattern,  with concomitant abnormal relaxation and increased filling  pressure (grade 2 diastolic dysfunction). Indeterminate filling  pressures.  - Aortic valve: Moderately calcified annulus. Mildly thickened,  mildly calcified leaflets. Sclerosis without stenosis. There was   mild regurgitation.  - Aorta: Mild aortic root dilatation.  - Atrial septum: No defect or patent foramen ovale was identified.  There was an atrial septal aneurysm.  - Pulmonic valve: There was mild regurgitation.   Lexiscan Myoview 10/16/2019:  No diagnostic ST segment changes to indicate ischemia.  Small size, moderate intensity, partially reversible defect in the mid to apical inferior and predominantly mid inferolateral wall. This is consistent with scar and a mild region of peri-infarct ischemia.  This is a low risk study.  Nuclear stress EF: 57%.  Chest CTA 02/04/2020 [facility]): 1. No definite evidence of pulmonary embolus.  2. 4.5 cm ascending thoracic aortic aneurysm is noted. Recommend  semi-annual imaging followup by CTA or MRA and referral to  cardiothoracic surgery if not already obtained. This recommendation  follows 2010ACCF/AHA/AATS/ACR/ASA/SCA/SCAI/SIR/STS/SVM Guidelines  for the Diagnosis and Management of Patients With Thoracic Aortic  Disease. Circulation. 2010; 121: E081-K481. Aortic aneurysm NOS  (ICD10-I71.9).  3. Small sliding-type hiatal hernia is noted.  4. Status post right shoulder arthroplasty is noted with expected  postoperative changes in the surrounding soft tissues.  5. 7 mm subpleural density is noted medially in the posterior right  lung base. Non-contrast chest CT at 6-12 months is recommended. If  the nodule is stable at time of repeat CT, then future CT at 18-24  months (from today's scan) is considered optional for low-risk  patients, but is recommended for high-risk patients. This  recommendation follows the consensus statement: Guidelines for  Management of Incidental Pulmonary Nodules Detected on CT Images:  From the Fleischner Society 2017; Radiology 2017; 284:228-243.    Assessment and Plan:  1.  CAD status post multiple stent interventions and ultimately CABG as outlined above.  He reports no progressive angina at this  time on medical therapy and had a low risk Lexiscan Myoview in April.  Continue observation on aspirin, Lipitor, Lopressor, and losartan.  2.  Ascending thoracic aortic aneurysm at 4.5 cm by chest CTA in August, asymptomatic.  Repeat imaging is planned for January 2022.  3.  Small right lung nodule by chest CTA in August, 7 mm and asymptomatic.  This will also be reimaged in January 2022.  4.  Mixed hyperlipidemia, on Lipitor.  Requesting interval lab work from Dr. Nadara Mustard.  5.  Essential hypertension, currently on Lopressor and losartan.  We talked about exercise and weight loss.  If he were to need additional blood pressure control, would consider addition of low-dose diuretic or Norvasc.  Medication Adjustments/Labs and Tests Ordered: Current medicines are reviewed at length with the patient today.  Concerns regarding medicines are outlined above.   Tests Ordered: Orders Placed This Encounter  Procedures  . CT ANGIO CHEST AORTA W/CM & OR WO/CM  . Basic metabolic panel    Medication Changes: No orders of the defined types were placed in this encounter.   Disposition:  Follow up 6 months in the [place] office.  Signed, Satira Sark, MD, [facility] 05/18/2020 8:37 AM    [facility] at [place], [place], [place] [address] Phone: [phone]; Fax: [phone]

## 2020-05-18 ENCOUNTER — Encounter: Payer: Self-pay | Admitting: *Deleted

## 2020-05-18 ENCOUNTER — Other Ambulatory Visit: Payer: Self-pay | Admitting: *Deleted

## 2020-05-18 ENCOUNTER — Encounter: Payer: Self-pay | Admitting: Cardiology

## 2020-05-18 ENCOUNTER — Ambulatory Visit (INDEPENDENT_AMBULATORY_CARE_PROVIDER_SITE_OTHER): Payer: Medicare Other | Admitting: Cardiology

## 2020-05-18 VITALS — BP 130/78 | HR 68 | Ht 68.5 in | Wt 278.8 lb

## 2020-05-18 DIAGNOSIS — I25119 Atherosclerotic heart disease of native coronary artery with unspecified angina pectoris: Secondary | ICD-10-CM

## 2020-05-18 DIAGNOSIS — I77810 Thoracic aortic ectasia: Secondary | ICD-10-CM

## 2020-05-18 DIAGNOSIS — I712 Thoracic aortic aneurysm, without rupture, unspecified: Secondary | ICD-10-CM

## 2020-05-18 DIAGNOSIS — R911 Solitary pulmonary nodule: Secondary | ICD-10-CM

## 2020-05-18 NOTE — Patient Instructions (Addendum)
Medication Instructions:    Your physician recommends that you continue on your current medications as directed. Please refer to the Current Medication list given to you today.  Labwork:  Your physician recommends that you return for non-fasting lab work in: 1-2 weeks before your chest CTA. This may be done at [facility] from 8:00 am - 4:00 pm. No appointment needed.   Testing/Procedures:  Non-Cardiac Chest CT Angiography (CTA), is a special type of CT scan that uses a computer to produce multi-dimensional views of major blood vessels throughout the body. In CT angiography, a contrast material is injected through an IV to help visualize the blood vessels  Follow-Up:  Your physician recommends that you schedule a follow-up appointment in: 6 months  Any Other Special Instructions Will Be Listed Below (If Applicable).  If you need a refill on your cardiac medications before your next appointment, please call your pharmacy.

## 2020-05-21 ENCOUNTER — Telehealth: Payer: Self-pay | Admitting: Cardiology

## 2020-05-21 NOTE — Telephone Encounter (Signed)
Pre-cert Verification for the following procedure    CT ANGIO CHEST AORTA W/CM OR WO/CM  DATE:  07/18/2020  LOCATION:  [place], [place]

## 2020-06-10 DIAGNOSIS — Z4789 Encounter for other orthopedic aftercare: Secondary | ICD-10-CM | POA: Diagnosis not present

## 2020-07-02 DIAGNOSIS — M1990 Unspecified osteoarthritis, unspecified site: Secondary | ICD-10-CM | POA: Diagnosis not present

## 2020-07-02 DIAGNOSIS — I2510 Atherosclerotic heart disease of native coronary artery without angina pectoris: Secondary | ICD-10-CM | POA: Diagnosis not present

## 2020-07-02 DIAGNOSIS — I10 Essential (primary) hypertension: Secondary | ICD-10-CM | POA: Diagnosis not present

## 2020-07-14 DIAGNOSIS — R911 Solitary pulmonary nodule: Secondary | ICD-10-CM | POA: Diagnosis not present

## 2020-07-14 DIAGNOSIS — I712 Thoracic aortic aneurysm, without rupture: Secondary | ICD-10-CM | POA: Diagnosis not present

## 2020-07-14 DIAGNOSIS — I77810 Thoracic aortic ectasia: Secondary | ICD-10-CM | POA: Diagnosis not present

## 2020-07-21 ENCOUNTER — Other Ambulatory Visit: Payer: Self-pay | Admitting: *Deleted

## 2020-07-21 DIAGNOSIS — I77810 Thoracic aortic ectasia: Secondary | ICD-10-CM | POA: Diagnosis not present

## 2020-07-21 DIAGNOSIS — I700 Atherosclerosis of aorta: Secondary | ICD-10-CM | POA: Diagnosis not present

## 2020-07-21 DIAGNOSIS — R918 Other nonspecific abnormal finding of lung field: Secondary | ICD-10-CM | POA: Diagnosis not present

## 2020-07-21 DIAGNOSIS — R911 Solitary pulmonary nodule: Secondary | ICD-10-CM | POA: Diagnosis not present

## 2020-07-29 ENCOUNTER — Encounter: Payer: Self-pay | Admitting: *Deleted

## 2020-07-29 DIAGNOSIS — Z4789 Encounter for other orthopedic aftercare: Secondary | ICD-10-CM | POA: Diagnosis not present

## 2020-07-29 DIAGNOSIS — M19012 Primary osteoarthritis, left shoulder: Secondary | ICD-10-CM | POA: Diagnosis not present

## 2020-08-03 ENCOUNTER — Telehealth: Payer: Self-pay | Admitting: *Deleted

## 2020-08-03 NOTE — Telephone Encounter (Signed)
-----   Message from Satira Sark, MD sent at 08/03/2020  8:26 AM EST ----- Results reviewed.  Follow-up chest CTA shows no significant increase in ascending thoracic aortic dilatation at 4.3 cm, this can be reimaged in 1 year.  Also no change in small right lung base nodule.

## 2020-08-03 NOTE — Telephone Encounter (Signed)
Patient informed. Copy sent to PCP °

## 2020-09-13 DIAGNOSIS — R5382 Chronic fatigue, unspecified: Secondary | ICD-10-CM | POA: Diagnosis not present

## 2020-09-13 DIAGNOSIS — N401 Enlarged prostate with lower urinary tract symptoms: Secondary | ICD-10-CM | POA: Diagnosis not present

## 2020-09-13 DIAGNOSIS — E7801 Familial hypercholesterolemia: Secondary | ICD-10-CM | POA: Diagnosis not present

## 2020-09-13 DIAGNOSIS — E7800 Pure hypercholesterolemia, unspecified: Secondary | ICD-10-CM | POA: Diagnosis not present

## 2020-09-13 DIAGNOSIS — E1165 Type 2 diabetes mellitus with hyperglycemia: Secondary | ICD-10-CM | POA: Diagnosis not present

## 2020-09-13 DIAGNOSIS — I10 Essential (primary) hypertension: Secondary | ICD-10-CM | POA: Diagnosis not present

## 2020-09-17 DIAGNOSIS — E7849 Other hyperlipidemia: Secondary | ICD-10-CM | POA: Diagnosis not present

## 2020-09-17 DIAGNOSIS — I10 Essential (primary) hypertension: Secondary | ICD-10-CM | POA: Diagnosis not present

## 2020-09-17 DIAGNOSIS — M159 Polyosteoarthritis, unspecified: Secondary | ICD-10-CM | POA: Diagnosis not present

## 2020-09-17 DIAGNOSIS — I70213 Atherosclerosis of native arteries of extremities with intermittent claudication, bilateral legs: Secondary | ICD-10-CM | POA: Diagnosis not present

## 2020-09-17 DIAGNOSIS — E291 Testicular hypofunction: Secondary | ICD-10-CM | POA: Diagnosis not present

## 2020-09-17 DIAGNOSIS — I739 Peripheral vascular disease, unspecified: Secondary | ICD-10-CM | POA: Diagnosis not present

## 2020-09-17 DIAGNOSIS — I77810 Thoracic aortic ectasia: Secondary | ICD-10-CM | POA: Diagnosis not present

## 2020-09-17 DIAGNOSIS — I2510 Atherosclerotic heart disease of native coronary artery without angina pectoris: Secondary | ICD-10-CM | POA: Diagnosis not present

## 2020-09-28 DIAGNOSIS — H02886 Meibomian gland dysfunction of left eye, unspecified eyelid: Secondary | ICD-10-CM | POA: Diagnosis not present

## 2020-09-28 DIAGNOSIS — H52223 Regular astigmatism, bilateral: Secondary | ICD-10-CM | POA: Diagnosis not present

## 2020-09-28 DIAGNOSIS — H02883 Meibomian gland dysfunction of right eye, unspecified eyelid: Secondary | ICD-10-CM | POA: Diagnosis not present

## 2020-09-28 DIAGNOSIS — H35363 Drusen (degenerative) of macula, bilateral: Secondary | ICD-10-CM | POA: Diagnosis not present

## 2020-09-28 DIAGNOSIS — H5203 Hypermetropia, bilateral: Secondary | ICD-10-CM | POA: Diagnosis not present

## 2020-09-28 DIAGNOSIS — H25813 Combined forms of age-related cataract, bilateral: Secondary | ICD-10-CM | POA: Diagnosis not present

## 2020-09-28 DIAGNOSIS — H04123 Dry eye syndrome of bilateral lacrimal glands: Secondary | ICD-10-CM | POA: Diagnosis not present

## 2020-09-28 DIAGNOSIS — H524 Presbyopia: Secondary | ICD-10-CM | POA: Diagnosis not present

## 2020-09-29 DIAGNOSIS — M1990 Unspecified osteoarthritis, unspecified site: Secondary | ICD-10-CM | POA: Diagnosis not present

## 2020-09-29 DIAGNOSIS — I10 Essential (primary) hypertension: Secondary | ICD-10-CM | POA: Diagnosis not present

## 2020-09-29 DIAGNOSIS — I2510 Atherosclerotic heart disease of native coronary artery without angina pectoris: Secondary | ICD-10-CM | POA: Diagnosis not present

## 2020-10-30 DIAGNOSIS — M1990 Unspecified osteoarthritis, unspecified site: Secondary | ICD-10-CM | POA: Diagnosis not present

## 2020-10-30 DIAGNOSIS — I2510 Atherosclerotic heart disease of native coronary artery without angina pectoris: Secondary | ICD-10-CM | POA: Diagnosis not present

## 2020-10-30 DIAGNOSIS — I10 Essential (primary) hypertension: Secondary | ICD-10-CM | POA: Diagnosis not present

## 2020-11-15 ENCOUNTER — Encounter: Payer: Self-pay | Admitting: Cardiology

## 2020-11-15 NOTE — Progress Notes (Deleted)
Cardiology Office Note  Date: 11/15/2020   ID: [name], DOB 1950/08/01, MRN [id number]  PCP:  Rory Percy, MD  Cardiologist:  Rozann Lesches, MD Electrophysiologist:  None   No chief complaint on file.   History of Present Illness: [name] is a 70 y.o. male last seen in November 2021.  Follow-up chest CTA from January of this year showed stable ascending aortic aneurysm at 4.3 cm with recommended reimaging in 1 year.  Also stable pleural-based lung nodule in the right.  Past Medical History:  Diagnosis Date  . Aortic root dilatation ([place])   . Arthritis   . Coronary atherosclerosis of native coronary artery    Multiple stents - RCA/LAD/diagonal, overlapping DES LAD, stent thrombosis 2005, ultimately CABG  . Essential hypertension   . H/O hiatal hernia   . History of GI bleed    On nonsteroidals  . Hyperlipemia   . Myocardial infarction [facility])    Anterior with LAD stent thrombosis 2005, 2004 and 2005   . Obesity   . Paroxysmal atrial flutter ([place])   . Sleep apnea    CPAP daily    Past Surgical History:  Procedure Laterality Date  . APPENDECTOMY    . BREATH TEK H PYLORI  06/11/2012   Procedure: BREATH TEK H PYLORI;  Surgeon: Shann Medal, MD;  Location: [facility];  Service: General;  Laterality: N/A;  Seth Bake  . CARDIAC SURGERY    . CARPAL TUNNEL RELEASE Right 6/12  . CARPAL TUNNEL RELEASE  06/06/2011   Procedure: CARPAL TUNNEL RELEASE;  Surgeon: Cammie Sickle., MD;  Location: [place];  Service: Orthopedics;  Laterality: Right;  . CARPAL TUNNEL RELEASE Right 04/10/2013   Procedure: REVISION OF CARPAL TUNNEL RELEASE LIGAMENT RIGHT WRIST;  Surgeon: Cammie Sickle., MD;  Location: [place];  Service: Orthopedics;  Laterality: Right;  . CERVICAL FUSION  9/10  . CHOLECYSTECTOMY    . CORONARY ANGIOPLASTY WITH STENT PLACEMENT  [id number]  . CORONARY ARTERY BYPASS GRAFT N/A 12/30/2013   Procedure: CORONARY  ARTERY BYPASS GRAFTING (CABG) x three, using left internal mammary artery and right leg greater sapheneous vein harvested endoscopically - LIMA to LAD, SVG-D, SVG-OM1;  Surgeon: Ivin Poot, MD;  Location: [place];  Service: Open Heart Surgery;  Laterality: N/A;  . ELBOW ARTHROSCOPY Right   . IRRIGATION AND DEBRIDEMENT SHOULDER Right 04/30/2020   Procedure: Right shoulder poly exchange and possible glensphere exchange and left shoulder injection;  Surgeon: Netta Cedars, MD;  [facility];  Service: Orthopedics;  Laterality: Right;  . LAPAROSCOPIC GASTRIC BANDING  3/14  . LEFT HEART CATHETERIZATION WITH CORONARY ANGIOGRAM N/A 12/26/2013   Procedure: LEFT HEART CATHETERIZATION WITH CORONARY ANGIOGRAM;  Surgeon: Leonie Man, MD;  Location: [facility] CATH LAB;  Service: Cardiovascular;  Laterality: N/A;  . REVERSE SHOULDER ARTHROPLASTY Right 01/30/2020   Procedure: REVERSE SHOULDER ARTHROPLASTY;  Surgeon: Netta Cedars, MD;  [facility];  Service: Orthopedics;  Laterality: Right;  interscalene block  . SHOULDER ARTHROSCOPY Right 4/11  . TOTAL KNEE ARTHROPLASTY Right 2006  . TOTAL KNEE ARTHROPLASTY Left 2007  . ULNAR NERVE TRANSPOSITION  06/06/2011   Procedure: ULNAR NERVE DECOMPRESSION/TRANSPOSITION;  Surgeon: Cammie Sickle., MD;  Location: [place];  Service: Orthopedics;  Laterality: Right;  decompression ulnar nerve right cubital tunnel     Current Outpatient Medications  Medication Sig Dispense Refill  . aspirin 81 MG EC tablet Take 81  mg by mouth at bedtime.     Marland Kitchen atorvastatin (LIPITOR) 20 MG tablet TAKE 1 TABLET ONCE DAILY. (Patient taking differently: Take 20 mg by mouth every evening. ) 30 tablet 6  . b complex vitamins tablet Take 1 tablet by mouth daily.    . Cholecalciferol (VITAMIN D) 125 MCG (5000 UT) CAPS Take 5,000 Units by mouth daily.     . Coenzyme Q10 (CO Q-10) 100 MG CAPS Take 100 mg by mouth daily.     . cyclobenzaprine (FLEXERIL) 10 MG tablet Take  1 tablet (10 mg total) by mouth 3 (three) times daily as needed for muscle spasms. 30 tablet 0  . EPINEPHrine 0.3 mg/0.3 mL IJ SOAJ injection Inject 0.3 mLs into the skin as needed for anaphylaxis.     . Glucosamine-Chondroitin (OSTEO BI-FLEX REGULAR STRENGTH PO) Take 2 tablets by mouth daily.     Marland Kitchen losartan (COZAAR) 100 MG tablet Take 100 mg by mouth daily.    . methocarbamol (ROBAXIN) 500 MG tablet Take 1 tablet (500 mg total) by mouth every 8 (eight) hours as needed for muscle spasms. 40 tablet 1  . metoprolol (LOPRESSOR) 50 MG tablet Take 50 mg by mouth 2 (two) times daily.    . Multiple Vitamins-Minerals (EYE VITAMINS PO) Take 1 capsule by mouth daily.     . naproxen (NAPROSYN) 500 MG tablet Take 500 mg by mouth 2 (two) times daily with a meal.     . OLIVE LEAF EXTRACT PO Take 750 mg by mouth daily.     . Omega-3 Fatty Acids (OMEGA 3 500) 500 MG CAPS Take 500 mg by mouth daily.     . Pomegranate, Punica granatum, (POMEGRANATE PO) Take 2 capsules by mouth daily.     . Tadalafil (CIALIS) 2.5 MG TABS Take 2.5 mg by mouth at bedtime.     . Turmeric 500 MG TABS Take 500 mg by mouth 2 (two) times daily.      No current facility-administered medications for this visit.   Allergies:  Other and Chlorhexidine   Social History: The patient  reports that he has never smoked. He quit smokeless tobacco use about 17 years ago.  His smokeless tobacco use included chew. He reports current alcohol use of about 1.0 standard drink of alcohol per week. He reports that he does not use drugs.   Family History: The patient's family history includes Colon cancer in his father and maternal grandmother; Diabetes in his maternal grandfather; Obesity in his mother.   ROS:  Please see the history of present illness. Otherwise, complete review of systems is positive for {NONE DEFAULTED:18576::"none"}.  All other systems are reviewed and negative.   Physical Exam: VS:  There were no vitals taken for this visit., BMI  There is no height or weight on file to calculate BMI.  Wt Readings from Last 3 Encounters:  05/18/20 278 lb 12.8 oz (126.5 kg)  04/30/20 274 lb 8 oz (124.5 kg)  04/27/20 274 lb 8 oz (124.5 kg)    General: Patient appears comfortable at rest. HEENT: Conjunctiva and lids normal, oropharynx clear with moist mucosa. Neck: Supple, no elevated JVP or carotid bruits, no thyromegaly. Lungs: Clear to auscultation, nonlabored breathing at rest. Cardiac: Regular rate and rhythm, no S3 or significant systolic murmur, no pericardial rub. Abdomen: Soft, nontender, no hepatomegaly, bowel sounds present, no guarding or rebound. Extremities: No pitting edema, distal pulses 2+. Skin: Warm and dry. Musculoskeletal: No kyphosis. Neuropsychiatric: Alert and oriented x3, affect grossly  appropriate.  ECG:  An ECG dated 08/19/2019 was personally reviewed today and demonstrated:  Sinus rhythm with prolonged PR interval, poor R wave progression.  Recent Labwork: 04/27/2020: BUN 18; Creatinine, Ser 0.91; Hemoglobin 14.0; Platelets 218; Potassium 4.4; Sodium 139     Component Value Date/Time   CHOL 115 02/22/2014 0630   TRIG 63 02/22/2014 0630   HDL 52 02/22/2014 0630   CHOLHDL 2.2 02/22/2014 0630   VLDL 13 02/22/2014 0630   LDLCALC 50 02/22/2014 0630  January 2022: Potassium 3.9, BUN 21, creatinine 0.91  Other Studies Reviewed Today:  Lexiscan Myoview 10/16/2019:  No diagnostic ST segment changes to indicate ischemia.  Small size, moderate intensity, partially reversible defect in the mid to apical inferior and predominantly mid inferolateral wall. This is consistent with scar and a mild region of peri-infarct ischemia.  This is a low risk study.  Nuclear stress EF: 57%.  Chest CTA 07/21/2020: IMPRESSION:  Relatively unchanged appearance of the ascending aorta, with maximum  diameter estimated 4.3 cm on the current CT. Recommend annual  imaging followup by CTA or MRA. This recommendation follows  2010  ACCF/AHA/AATS/ACR/ASA/SCA/SCAI/SIR/STS/SVM Guidelines for the  Diagnosis and Management of Patients with Thoracic Aortic Disease.  Circulation. 2010; 121: T017-B939. Aortic aneurysm NOS (ICD10-I71.9)   Unchanged appearance of pleural base nodule at the right lung base.  A future, second surveillance CT at 6-12 months (from today's scan)  is considered optional for low-risk patients, but is recommended for  high-risk patients. This recommendation follows the consensus  statement: Guidelines for Management of Incidental Pulmonary Nodules  Detected on CT Images: From the Fleischner Society 2017; Radiology  2017; 284:228-243.   Aortic Atherosclerosis (ICD10-I70.0).   Assessment and Plan:    Medication Adjustments/Labs and Tests Ordered: Current medicines are reviewed at length with the patient today.  Concerns regarding medicines are outlined above.   Tests Ordered: No orders of the defined types were placed in this encounter.   Medication Changes: No orders of the defined types were placed in this encounter.   Disposition:  Follow up {follow up:15908}  Signed, Satira Sark, MD, [facility] 11/15/2020 8:24 AM    [place] [facility] [place], [place], [place] [address] Phone: [phone]; Fax: [phone]

## 2020-11-16 ENCOUNTER — Ambulatory Visit: Payer: Medicare Other | Admitting: Cardiology

## 2020-11-17 DIAGNOSIS — R059 Cough, unspecified: Secondary | ICD-10-CM | POA: Diagnosis not present

## 2020-11-17 DIAGNOSIS — R0981 Nasal congestion: Secondary | ICD-10-CM | POA: Diagnosis not present

## 2020-11-17 DIAGNOSIS — Z20828 Contact with and (suspected) exposure to other viral communicable diseases: Secondary | ICD-10-CM | POA: Diagnosis not present

## 2020-11-29 DIAGNOSIS — I10 Essential (primary) hypertension: Secondary | ICD-10-CM | POA: Diagnosis not present

## 2020-11-29 DIAGNOSIS — M1990 Unspecified osteoarthritis, unspecified site: Secondary | ICD-10-CM | POA: Diagnosis not present

## 2020-11-29 DIAGNOSIS — I2510 Atherosclerotic heart disease of native coronary artery without angina pectoris: Secondary | ICD-10-CM | POA: Diagnosis not present

## 2020-11-30 DIAGNOSIS — J069 Acute upper respiratory infection, unspecified: Secondary | ICD-10-CM | POA: Diagnosis not present

## 2020-12-06 DIAGNOSIS — J441 Chronic obstructive pulmonary disease with (acute) exacerbation: Secondary | ICD-10-CM | POA: Diagnosis not present

## 2020-12-13 ENCOUNTER — Ambulatory Visit: Payer: Medicare Other | Admitting: Family Medicine

## 2020-12-30 DIAGNOSIS — I2510 Atherosclerotic heart disease of native coronary artery without angina pectoris: Secondary | ICD-10-CM | POA: Diagnosis not present

## 2020-12-30 DIAGNOSIS — I10 Essential (primary) hypertension: Secondary | ICD-10-CM | POA: Diagnosis not present

## 2020-12-30 DIAGNOSIS — M1990 Unspecified osteoarthritis, unspecified site: Secondary | ICD-10-CM | POA: Diagnosis not present

## 2021-01-05 NOTE — Progress Notes (Signed)
Cardiology Office Note  Date: 01/06/2021   ID: [name], DOB January 16, 1951, MRN [id number]  PCP:  Curlene Labrum, MD  Cardiologist:  Rozann Lesches, MD Electrophysiologist:  None   Chief Complaint: 6 month follow up  History of Present Illness: [name] is a 70 y.o. male with a history of CAD/MI, HTN, Aortic root dilatation, GIB, hiatal hernia, HLD, paroxysmal atrial flutter, obesity.  He was seen by Dr. Domenic Polite 05/18/2020 for follow-up visit.  He reported stable dyspnea on exertion with NYHA class II symptoms.  No anginal symptoms.  Prior Myoview in April 2021 was low risk plan was to come to continue medical therapy unless symptoms escalated.  He was on a stable cardiac medication regimen.  His blood pressure was reasonable at visit but stated it was higher at home.  Plan was to either add a diuretic or low-dose Norvasc.  Increasing activity and weight loss was advised.  His CT angiogram [facility] his chest at [facility] in August demonstrated 4.5 cm ascending thoracic aortic aneurysm with 5 mm subpleural density medially in the posterior right lung base.  Interval lab work from Dr. Nadara Mustard was recommended.  Last CTA chest 07/21/2020 [facility] demonstrated no significant increase in size of ascending thoracic aortic dilatation at 4.3 cm. Recommended re-imagine in one year. No change in small right lung base nodule.  Is here for 59-month follow-up.  He denies any recent hospitalizations.  He states he did have symptoms earlier in the year which she thought might have been COVID since 3 of his other family members contracted COVID.  He states he did multiple home COVID test which were negative.  He ultimately went to a local pharmacy and had the antibody test which showed no COVID antibodies.  States he is having some sinus issues which make him feel a little lightheaded at times.  He denies any syncopal or near syncopal episode.  His blood pressure initially on arrival was 108/62  recheck in left arm was 130/70.  EKG today shows sinus rhythm with first-degree AV block rate of 66.  He denies any anginal or exertional symptoms, CVA or TIA-like symptoms, palpitations or arrhythmias, PND, orthopnea.  He has OSA and is compliant with his CPAP.  He denies any bleeding issues.  Denies any claudication-like symptoms, DVT or PE-like symptoms or lower extremity edema.  He has lost some weight since his last visit around 12 pounds.  He is continuing tadalafil 2.5 mg for pulmonary hypertension.   Past Medical History:  Diagnosis Date   Aortic root dilatation ([facility])    Arthritis    Coronary atherosclerosis of native coronary artery    Multiple stents - RCA/LAD/diagonal, overlapping DES LAD, stent thrombosis 2005, ultimately CABG   Essential hypertension    H/O hiatal hernia    History of GI bleed    On nonsteroidals   Hyperlipemia    Myocardial infarction [facility])    Anterior with LAD stent thrombosis 2005, 2004 and 2005    Obesity    Paroxysmal atrial flutter ([place])    Sleep apnea    CPAP daily    Past Surgical History:  Procedure Laterality Date   APPENDECTOMY     BREATH TEK H PYLORI  06/11/2012   Procedure: BREATH TEK H PYLORI;  Surgeon: Shann Medal, MD;  Location: [facility];  Service: General;  Laterality: N/A;  [place] Right 6/12   CARPAL TUNNEL RELEASE  06/06/2011   Procedure: CARPAL TUNNEL RELEASE;  Surgeon: Cammie Sickle., MD;  Location: [place];  Service: Orthopedics;  Laterality: Right;   CARPAL TUNNEL RELEASE Right 04/10/2013   Procedure: REVISION OF CARPAL TUNNEL RELEASE LIGAMENT RIGHT WRIST;  Surgeon: Cammie Sickle., MD;  Location: [place];  Service: Orthopedics;  Laterality: Right;   CERVICAL FUSION  9/10   CHOLECYSTECTOMY     CORONARY ANGIOPLASTY WITH STENT PLACEMENT  [id number]   CORONARY ARTERY BYPASS GRAFT N/A 12/30/2013   Procedure: CORONARY ARTERY BYPASS GRAFTING  (CABG) x three, using left internal mammary artery and right leg greater sapheneous vein harvested endoscopically - LIMA to LAD, SVG-D, SVG-OM1;  Surgeon: Ivin Poot, MD;  Location: [place];  Service: Open Heart Surgery;  Laterality: N/A;   ELBOW ARTHROSCOPY Right    IRRIGATION AND DEBRIDEMENT SHOULDER Right 04/30/2020   Procedure: Right shoulder poly exchange and possible glensphere exchange and left shoulder injection;  Surgeon: Netta Cedars, MD;  [facility];  Service: Orthopedics;  Laterality: Right;   LAPAROSCOPIC GASTRIC BANDING  3/14   LEFT HEART CATHETERIZATION WITH CORONARY ANGIOGRAM N/A 12/26/2013   Procedure: LEFT HEART CATHETERIZATION WITH CORONARY ANGIOGRAM;  Surgeon: Leonie Man, MD;  Location: [facility] CATH LAB;  Service: Cardiovascular;  Laterality: N/A;   REVERSE SHOULDER ARTHROPLASTY Right 01/30/2020   Procedure: REVERSE SHOULDER ARTHROPLASTY;  Surgeon: Netta Cedars, MD;  [facility];  Service: Orthopedics;  Laterality: Right;  interscalene block   SHOULDER ARTHROSCOPY Right 4/11   TOTAL KNEE ARTHROPLASTY Right 2006   TOTAL KNEE ARTHROPLASTY Left 2007   ULNAR NERVE TRANSPOSITION  06/06/2011   Procedure: ULNAR NERVE DECOMPRESSION/TRANSPOSITION;  Surgeon: Cammie Sickle., MD;  Location: [place];  Service: Orthopedics;  Laterality: Right;  decompression ulnar nerve right cubital tunnel     Current Outpatient Medications  Medication Sig Dispense Refill   aspirin 81 MG EC tablet Take 81 mg by mouth at bedtime.      atorvastatin (LIPITOR) 20 MG tablet TAKE 1 TABLET ONCE DAILY. (Patient taking differently: Take 20 mg by mouth every evening.) 30 tablet 6   b complex vitamins tablet Take 1 tablet by mouth daily.     Cholecalciferol (VITAMIN D) 125 MCG (5000 UT) CAPS Take 5,000 Units by mouth daily.      Coenzyme Q10 (CO Q-10) 100 MG CAPS Take 100 mg by mouth daily.      cyclobenzaprine (FLEXERIL) 10 MG tablet Take 1 tablet (10 mg total) by mouth 3  (three) times daily as needed for muscle spasms. 30 tablet 0   EPINEPHrine 0.3 mg/0.3 mL IJ SOAJ injection Inject 0.3 mLs into the skin as needed for anaphylaxis.      Glucosamine-Chondroitin (OSTEO BI-FLEX REGULAR STRENGTH PO) Take 2 tablets by mouth daily.      losartan (COZAAR) 100 MG tablet Take 100 mg by mouth daily.     metoprolol (LOPRESSOR) 50 MG tablet Take 50 mg by mouth 2 (two) times daily.     Multiple Vitamins-Minerals (EYE VITAMINS PO) Take 1 capsule by mouth daily.      naproxen (NAPROSYN) 500 MG tablet Take 500 mg by mouth 2 (two) times daily with a meal.      nitroGLYCERIN (NITROSTAT) 0.4 MG SL tablet Place 0.4 mg under the tongue every 5 (five) minutes as needed for chest pain. Up to 3x     OLIVE LEAF EXTRACT PO Take 750 mg by mouth daily.  Omega-3 Fatty Acids (OMEGA 3 500) 500 MG CAPS Take 500 mg by mouth daily.      Pomegranate, Punica granatum, (POMEGRANATE PO) Take 2 capsules by mouth daily.      Tadalafil 2.5 MG TABS Take 2.5 mg by mouth at bedtime.      Turmeric 500 MG TABS Take 500 mg by mouth 2 (two) times daily.      No current facility-administered medications for this visit.   Allergies:  Other and Chlorhexidine   Social History: The patient  reports that he has never smoked. He quit smokeless tobacco use about 17 years ago.  His smokeless tobacco use included chew. He reports current alcohol use of about 1.0 standard drink of alcohol per week. He reports that he does not use drugs.   Family History: The patient's family history includes Colon cancer in his father and maternal grandmother; Diabetes in his maternal grandfather; Obesity in his mother.   ROS:  Please see the history of present illness. Otherwise, complete review of systems is positive for none.  All other systems are reviewed and negative.   Physical Exam: VS:  BP 108/62   Pulse 66   Ht 5\' 8"  (1.727 m)   Wt 266 lb 3.2 oz (120.7 kg)   SpO2 97%   BMI 40.48 kg/m , BMI Body mass index is 40.48  kg/m.  Wt Readings from Last 3 Encounters:  01/06/21 266 lb 3.2 oz (120.7 kg)  05/18/20 278 lb 12.8 oz (126.5 kg)  04/30/20 274 lb 8 oz (124.5 kg)    General: Morbidly obese patient appears comfortable at rest. Neck: Supple, no elevated JVP or carotid bruits, no thyromegaly. Lungs: Clear to auscultation, nonlabored breathing at rest. Cardiac: Regular rate and rhythm, no S3 or significant systolic murmur, no pericardial rub. Extremities: No pitting edema, distal pulses 2+. Skin: Warm and dry. Musculoskeletal: No kyphosis. Neuropsychiatric: Alert and oriented x3, affect grossly appropriate.  ECG: January 06, 2021 sinus rhythm with first-degree AV block rate of 66, low voltage QRS, cannot rule out anterior infarct, age undetermined.  Recent Labwork: 04/27/2020: BUN 18; Creatinine, Ser 0.91; Hemoglobin 14.0; Platelets 218; Potassium 4.4; Sodium 139     Component Value Date/Time   CHOL 115 02/22/2014 0630   TRIG 63 02/22/2014 0630   HDL 52 02/22/2014 0630   CHOLHDL 2.2 02/22/2014 0630   VLDL 13 02/22/2014 0630   LDLCALC 50 02/22/2014 0630    Other Studies Reviewed Today:  CTA chest 07/21/2020 [facility]  Demonstrated no significant increase in size of ascending thoracic aortic dilatation at 4.3 cm. Recommended re-imagine in one year. No change in small right lung base nodule.   Echocardiogram 08/02/2017: - Left ventricle: The cavity size was normal. Wall thickness was    normal. Systolic function was normal. The estimated ejection    fraction was in the range of 55% to 60%. Wall motion was normal;    there were no regional wall motion abnormalities. Features are    consistent with a pseudonormal left ventricular filling pattern,    with concomitant abnormal relaxation and increased filling    pressure (grade 2 diastolic dysfunction). Indeterminate filling    pressures.  - Aortic valve: Moderately calcified annulus. Mildly thickened,    mildly calcified leaflets. Sclerosis without  stenosis. There was    mild regurgitation.  - Aorta: Mild aortic root dilatation.  - Atrial septum: No defect or patent foramen ovale was identified.    There was an atrial septal aneurysm.  -  Pulmonic valve: There was mild regurgitation.   Lexiscan Myoview 10/16/2019: No diagnostic ST segment changes to indicate ischemia. Small size, moderate intensity, partially reversible defect in the mid to apical inferior and predominantly mid inferolateral wall. This is consistent with scar and a mild region of peri-infarct ischemia. This is a low risk study. Nuclear stress EF: 57%.   Chest CTA 02/04/2020 [facility]): 1. No definite evidence of pulmonary embolus.  2. 4.5 cm ascending thoracic aortic aneurysm is noted. Recommend  semi-annual imaging followup by CTA or MRA and referral to  cardiothoracic surgery if not already obtained. This recommendation  follows 2010 ACCF/AHA/AATS/ACR/ASA/SCA/SCAI/SIR/STS/SVM Guidelines  for the Diagnosis and Management of Patients With Thoracic Aortic  Disease. Circulation. 2010; 121: H545-G256. Aortic aneurysm NOS  (ICD10-I71.9).  3. Small sliding-type hiatal hernia is noted.  4. Status post right shoulder arthroplasty is noted with expected  postoperative changes in the surrounding soft tissues.  5. 7 mm subpleural density is noted medially in the posterior right  lung base. Non-contrast chest CT at 6-12 months is recommended. If  the nodule is stable at time of repeat CT, then future CT at 18-24  months (from today's scan) is considered optional for low-risk  patients, but is recommended for high-risk patients. This  recommendation follows the consensus statement: Guidelines for  Management of Incidental Pulmonary Nodules Detected on CT Images:  From the Fleischner Society 2017; Radiology 2017; 284:228-243.      Assessment and Plan:  1. CAD in native artery   2. Ascending aorta dilatation (HCC)   3. Mixed hyperlipidemia   4. Pulmonary nodule    5. Essential hypertension    1. CAD in native artery Denies any anginal or exertional symptoms.  Continue aspirin 81 mg daily.  Continue sublingual nitroglycerin as needed.  2. Ascending aorta dilatation (HCC) CT of the chest in January 2022 showed no significant change in aortic dilatation with recent measurement of 4.3 cm.  Plan for follow-up imaging in 1 year.  3. Mixed hyperlipidemia Continue atorvastatin 20 mg p.o. daily.  4. Pulmonary nodule CT of the chest in January 2022 showed no significant change in aortic dilatation with recent measurement of 4.3 cm.  No change in small right lung base pulmonary nodule.  Plan to follow-up imaging in 1 year  5.  Essential hypertension Initial blood pressure on arrival was 108/62.  Recheck in left arm 130/70.  Continue losartan 100 mg daily.  Continue metoprolol 50 mg p.o. twice daily.  Medication Adjustments/Labs and Tests Ordered: Current medicines are reviewed at length with the patient today.  Concerns regarding medicines are outlined above.   Disposition: Follow-up with Dr. Domenic Polite or APP 6 months  Signed, Levell July, NP 01/06/2021 3:41 PM    [facility] at [place], [place], [place] [address] Phone: [phone]; Fax: [phone]

## 2021-01-06 ENCOUNTER — Ambulatory Visit (INDEPENDENT_AMBULATORY_CARE_PROVIDER_SITE_OTHER): Payer: Medicare Other | Admitting: Family Medicine

## 2021-01-06 ENCOUNTER — Encounter: Payer: Self-pay | Admitting: Family Medicine

## 2021-01-06 VITALS — BP 130/70 | HR 66 | Ht 68.0 in | Wt 266.2 lb

## 2021-01-06 DIAGNOSIS — E782 Mixed hyperlipidemia: Secondary | ICD-10-CM

## 2021-01-06 DIAGNOSIS — I10 Essential (primary) hypertension: Secondary | ICD-10-CM

## 2021-01-06 DIAGNOSIS — R911 Solitary pulmonary nodule: Secondary | ICD-10-CM

## 2021-01-06 DIAGNOSIS — I2510 Atherosclerotic heart disease of native coronary artery without angina pectoris: Secondary | ICD-10-CM | POA: Diagnosis not present

## 2021-01-06 DIAGNOSIS — I77810 Thoracic aortic ectasia: Secondary | ICD-10-CM

## 2021-01-06 NOTE — Patient Instructions (Signed)
Medication Instructions:  Your physician recommends that you continue on your current medications as directed. Please refer to the Current Medication list given to you today.  *If you need a refill on your cardiac medications before your next appointment, please call your pharmacy*   Lab Work: None If you have labs (blood work) drawn today and your tests are completely normal, you will receive your results only by: [place] (if you have MyChart) OR A paper copy in the mail If you have any lab test that is abnormal or we need to change your treatment, we will call you to review the results.   Testing/Procedures: None   Follow-Up: At [facility], you and your health needs are our priority.  As part of our continuing mission to provide you with exceptional heart care, we have created designated Provider Care Teams.  These Care Teams include your primary Cardiologist (physician) and Advanced Practice Providers (APPs -  Physician Assistants and Nurse Practitioners) who all work together to provide you with the care you need, when you need it.  We recommend signing up for the patient portal called "MyChart".  Sign up information is provided on this After Visit Summary.  MyChart is used to connect with patients for Virtual Visits (Telemedicine).  Patients are able to view lab/test results, encounter notes, upcoming appointments, etc.  Non-urgent messages can be sent to your provider as well.   To learn more about what you can do with MyChart, go to [email or web address].    Your next appointment:   6 month(s)  The format for your next appointment:   In Person  Provider:   Katina Dung, NP   Other Instructions

## 2021-01-30 DIAGNOSIS — I10 Essential (primary) hypertension: Secondary | ICD-10-CM | POA: Diagnosis not present

## 2021-01-30 DIAGNOSIS — M1990 Unspecified osteoarthritis, unspecified site: Secondary | ICD-10-CM | POA: Diagnosis not present

## 2021-01-30 DIAGNOSIS — I2510 Atherosclerotic heart disease of native coronary artery without angina pectoris: Secondary | ICD-10-CM | POA: Diagnosis not present

## 2021-03-02 DIAGNOSIS — I10 Essential (primary) hypertension: Secondary | ICD-10-CM | POA: Diagnosis not present

## 2021-03-02 DIAGNOSIS — I2510 Atherosclerotic heart disease of native coronary artery without angina pectoris: Secondary | ICD-10-CM | POA: Diagnosis not present

## 2021-03-02 DIAGNOSIS — M1990 Unspecified osteoarthritis, unspecified site: Secondary | ICD-10-CM | POA: Diagnosis not present

## 2021-03-11 DIAGNOSIS — Z125 Encounter for screening for malignant neoplasm of prostate: Secondary | ICD-10-CM | POA: Diagnosis not present

## 2021-03-11 DIAGNOSIS — E1165 Type 2 diabetes mellitus with hyperglycemia: Secondary | ICD-10-CM | POA: Diagnosis not present

## 2021-03-11 DIAGNOSIS — R5382 Chronic fatigue, unspecified: Secondary | ICD-10-CM | POA: Diagnosis not present

## 2021-03-11 DIAGNOSIS — I10 Essential (primary) hypertension: Secondary | ICD-10-CM | POA: Diagnosis not present

## 2021-03-11 DIAGNOSIS — E7849 Other hyperlipidemia: Secondary | ICD-10-CM | POA: Diagnosis not present

## 2021-03-11 DIAGNOSIS — E782 Mixed hyperlipidemia: Secondary | ICD-10-CM | POA: Diagnosis not present

## 2021-03-11 DIAGNOSIS — Z1329 Encounter for screening for other suspected endocrine disorder: Secondary | ICD-10-CM | POA: Diagnosis not present

## 2021-03-15 DIAGNOSIS — I739 Peripheral vascular disease, unspecified: Secondary | ICD-10-CM | POA: Diagnosis not present

## 2021-03-15 DIAGNOSIS — I2510 Atherosclerotic heart disease of native coronary artery without angina pectoris: Secondary | ICD-10-CM | POA: Diagnosis not present

## 2021-03-15 DIAGNOSIS — Z1389 Encounter for screening for other disorder: Secondary | ICD-10-CM | POA: Diagnosis not present

## 2021-03-15 DIAGNOSIS — Z1331 Encounter for screening for depression: Secondary | ICD-10-CM | POA: Diagnosis not present

## 2021-03-15 DIAGNOSIS — Z23 Encounter for immunization: Secondary | ICD-10-CM | POA: Diagnosis not present

## 2021-03-15 DIAGNOSIS — M4807 Spinal stenosis, lumbosacral region: Secondary | ICD-10-CM | POA: Diagnosis not present

## 2021-03-15 DIAGNOSIS — M159 Polyosteoarthritis, unspecified: Secondary | ICD-10-CM | POA: Diagnosis not present

## 2021-03-15 DIAGNOSIS — I10 Essential (primary) hypertension: Secondary | ICD-10-CM | POA: Diagnosis not present

## 2021-03-15 DIAGNOSIS — E7849 Other hyperlipidemia: Secondary | ICD-10-CM | POA: Diagnosis not present

## 2021-03-15 DIAGNOSIS — I70213 Atherosclerosis of native arteries of extremities with intermittent claudication, bilateral legs: Secondary | ICD-10-CM | POA: Diagnosis not present

## 2021-03-15 DIAGNOSIS — G4733 Obstructive sleep apnea (adult) (pediatric): Secondary | ICD-10-CM | POA: Diagnosis not present

## 2021-03-15 DIAGNOSIS — Z6841 Body Mass Index (BMI) 40.0 and over, adult: Secondary | ICD-10-CM | POA: Diagnosis not present

## 2021-03-15 DIAGNOSIS — E782 Mixed hyperlipidemia: Secondary | ICD-10-CM | POA: Diagnosis not present

## 2021-03-15 DIAGNOSIS — I77810 Thoracic aortic ectasia: Secondary | ICD-10-CM | POA: Diagnosis not present

## 2021-03-31 DIAGNOSIS — Z20828 Contact with and (suspected) exposure to other viral communicable diseases: Secondary | ICD-10-CM | POA: Diagnosis not present

## 2021-03-31 DIAGNOSIS — Z23 Encounter for immunization: Secondary | ICD-10-CM | POA: Diagnosis not present

## 2021-04-03 DIAGNOSIS — I70208 Unspecified atherosclerosis of native arteries of extremities, other extremity: Secondary | ICD-10-CM | POA: Diagnosis not present

## 2021-04-03 DIAGNOSIS — I110 Hypertensive heart disease with heart failure: Secondary | ICD-10-CM | POA: Diagnosis not present

## 2021-04-03 DIAGNOSIS — S61411A Laceration without foreign body of right hand, initial encounter: Secondary | ICD-10-CM | POA: Diagnosis not present

## 2021-04-03 DIAGNOSIS — I509 Heart failure, unspecified: Secondary | ICD-10-CM | POA: Diagnosis not present

## 2021-04-03 DIAGNOSIS — I252 Old myocardial infarction: Secondary | ICD-10-CM | POA: Diagnosis not present

## 2021-04-03 DIAGNOSIS — W25XXXA Contact with sharp glass, initial encounter: Secondary | ICD-10-CM | POA: Diagnosis not present

## 2021-04-03 DIAGNOSIS — M1811 Unilateral primary osteoarthritis of first carpometacarpal joint, right hand: Secondary | ICD-10-CM | POA: Diagnosis not present

## 2021-04-03 DIAGNOSIS — Z23 Encounter for immunization: Secondary | ICD-10-CM | POA: Diagnosis not present

## 2021-04-03 DIAGNOSIS — Z7982 Long term (current) use of aspirin: Secondary | ICD-10-CM | POA: Diagnosis not present

## 2021-04-05 DIAGNOSIS — Z96653 Presence of artificial knee joint, bilateral: Secondary | ICD-10-CM | POA: Diagnosis not present

## 2021-04-05 DIAGNOSIS — I252 Old myocardial infarction: Secondary | ICD-10-CM | POA: Diagnosis not present

## 2021-04-05 DIAGNOSIS — Z5189 Encounter for other specified aftercare: Secondary | ICD-10-CM | POA: Diagnosis not present

## 2021-04-05 DIAGNOSIS — I712 Thoracic aortic aneurysm, without rupture, unspecified: Secondary | ICD-10-CM | POA: Diagnosis not present

## 2021-04-05 DIAGNOSIS — S61411D Laceration without foreign body of right hand, subsequent encounter: Secondary | ICD-10-CM | POA: Diagnosis not present

## 2021-04-05 DIAGNOSIS — I509 Heart failure, unspecified: Secondary | ICD-10-CM | POA: Diagnosis not present

## 2021-04-05 DIAGNOSIS — I110 Hypertensive heart disease with heart failure: Secondary | ICD-10-CM | POA: Diagnosis not present

## 2021-04-05 DIAGNOSIS — Z96611 Presence of right artificial shoulder joint: Secondary | ICD-10-CM | POA: Diagnosis not present

## 2021-04-05 DIAGNOSIS — X58XXXD Exposure to other specified factors, subsequent encounter: Secondary | ICD-10-CM | POA: Diagnosis not present

## 2021-04-07 DIAGNOSIS — M25572 Pain in left ankle and joints of left foot: Secondary | ICD-10-CM | POA: Diagnosis not present

## 2021-04-07 DIAGNOSIS — M25571 Pain in right ankle and joints of right foot: Secondary | ICD-10-CM | POA: Diagnosis not present

## 2021-04-07 DIAGNOSIS — Z6841 Body Mass Index (BMI) 40.0 and over, adult: Secondary | ICD-10-CM | POA: Diagnosis not present

## 2021-04-15 DIAGNOSIS — X58XXXD Exposure to other specified factors, subsequent encounter: Secondary | ICD-10-CM | POA: Diagnosis not present

## 2021-04-15 DIAGNOSIS — S61411D Laceration without foreign body of right hand, subsequent encounter: Secondary | ICD-10-CM | POA: Diagnosis not present

## 2021-06-08 DIAGNOSIS — M25531 Pain in right wrist: Secondary | ICD-10-CM | POA: Diagnosis not present

## 2021-06-08 DIAGNOSIS — M65849 Other synovitis and tenosynovitis, unspecified hand: Secondary | ICD-10-CM | POA: Diagnosis not present

## 2021-06-08 DIAGNOSIS — M13831 Other specified arthritis, right wrist: Secondary | ICD-10-CM | POA: Diagnosis not present

## 2021-06-08 DIAGNOSIS — M79645 Pain in left finger(s): Secondary | ICD-10-CM | POA: Diagnosis not present

## 2021-06-08 DIAGNOSIS — M65332 Trigger finger, left middle finger: Secondary | ICD-10-CM | POA: Diagnosis not present

## 2021-07-11 ENCOUNTER — Ambulatory Visit: Payer: Medicare Other | Admitting: Family Medicine

## 2021-07-12 DIAGNOSIS — H02831 Dermatochalasis of right upper eyelid: Secondary | ICD-10-CM | POA: Diagnosis not present

## 2021-07-12 DIAGNOSIS — H02834 Dermatochalasis of left upper eyelid: Secondary | ICD-10-CM | POA: Diagnosis not present

## 2021-07-12 DIAGNOSIS — H25813 Combined forms of age-related cataract, bilateral: Secondary | ICD-10-CM | POA: Diagnosis not present

## 2021-07-12 DIAGNOSIS — H353131 Nonexudative age-related macular degeneration, bilateral, early dry stage: Secondary | ICD-10-CM | POA: Diagnosis not present

## 2021-07-12 DIAGNOSIS — H04123 Dry eye syndrome of bilateral lacrimal glands: Secondary | ICD-10-CM | POA: Diagnosis not present

## 2021-07-13 DIAGNOSIS — M65332 Trigger finger, left middle finger: Secondary | ICD-10-CM | POA: Diagnosis not present

## 2021-07-13 DIAGNOSIS — M13831 Other specified arthritis, right wrist: Secondary | ICD-10-CM | POA: Diagnosis not present

## 2021-07-13 DIAGNOSIS — M79645 Pain in left finger(s): Secondary | ICD-10-CM | POA: Diagnosis not present

## 2021-07-13 DIAGNOSIS — M65839 Other synovitis and tenosynovitis, unspecified forearm: Secondary | ICD-10-CM | POA: Diagnosis not present

## 2021-07-13 DIAGNOSIS — M19042 Primary osteoarthritis, left hand: Secondary | ICD-10-CM | POA: Diagnosis not present
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
## 2021-07-19 ENCOUNTER — Ambulatory Visit: Payer: Medicare Other | Admitting: Cardiology

## 2021-07-19 ENCOUNTER — Encounter: Payer: Self-pay | Admitting: *Deleted

## 2021-07-19 ENCOUNTER — Encounter: Payer: Self-pay | Admitting: Cardiology

## 2021-07-19 VITALS — BP 128/78 | HR 64 | Ht 68.0 in | Wt 287.2 lb

## 2021-07-19 DIAGNOSIS — R911 Solitary pulmonary nodule: Secondary | ICD-10-CM

## 2021-07-19 DIAGNOSIS — I712 Thoracic aortic aneurysm, without rupture, unspecified: Secondary | ICD-10-CM

## 2021-07-19 DIAGNOSIS — I25119 Atherosclerotic heart disease of native coronary artery with unspecified angina pectoris: Secondary | ICD-10-CM | POA: Diagnosis not present

## 2021-07-19 DIAGNOSIS — E782 Mixed hyperlipidemia: Secondary | ICD-10-CM

## 2021-07-19 NOTE — Patient Instructions (Addendum)
Medication Instructions:  Your physician recommends that you continue on your current medications as directed. Please refer to the Current Medication list given to you today.  Labwork: BMET within 6 weeks of CT [facility] or [facility]  Testing/Procedures: CTA chest & aorta @[facility]  Follow-Up: Your physician recommends that you schedule a follow-up appointment in: 6 months  Any Other Special Instructions Will Be Listed Below (If Applicable).  If you need a refill on your cardiac medications before your next appointment, please call your pharmacy.

## 2021-07-22 DIAGNOSIS — I712 Thoracic aortic aneurysm, without rupture, unspecified: Secondary | ICD-10-CM | POA: Diagnosis not present

## 2021-07-22 DIAGNOSIS — I25119 Atherosclerotic heart disease of native coronary artery with unspecified angina pectoris: Secondary | ICD-10-CM | POA: Diagnosis not present

## 2021-07-22 DIAGNOSIS — R911 Solitary pulmonary nodule: Secondary | ICD-10-CM | POA: Diagnosis not present

## 2021-07-27 ENCOUNTER — Telehealth: Payer: Self-pay | Admitting: *Deleted

## 2021-07-27 ENCOUNTER — Other Ambulatory Visit: Payer: Self-pay | Admitting: *Deleted

## 2021-07-27 DIAGNOSIS — I712 Thoracic aortic aneurysm, without rupture, unspecified: Secondary | ICD-10-CM

## 2021-07-27 DIAGNOSIS — R911 Solitary pulmonary nodule: Secondary | ICD-10-CM

## 2021-07-27 NOTE — Telephone Encounter (Signed)
-----   Message from Satira Sark, MD sent at 07/27/2021 10:37 AM EST ----- Results reviewed.  Follow-up chest CT shows stable findings including both ascending thoracic aortic size at 4.3 cm and also small right lung nodule.  Anticipate follow-up imaging in 1 year.

## 2021-07-27 NOTE — Telephone Encounter (Signed)
Result from 07/21/2020 CT chest & aorta Please disregard Patient scheduled to have repeat scan on 08/01/2021

## 2021-08-03 DIAGNOSIS — R911 Solitary pulmonary nodule: Secondary | ICD-10-CM | POA: Diagnosis not present

## 2021-08-03 DIAGNOSIS — Z951 Presence of aortocoronary bypass graft: Secondary | ICD-10-CM | POA: Diagnosis not present

## 2021-08-03 DIAGNOSIS — I2510 Atherosclerotic heart disease of native coronary artery without angina pectoris: Secondary | ICD-10-CM | POA: Diagnosis not present

## 2021-08-03 DIAGNOSIS — I7121 Aneurysm of the ascending aorta, without rupture: Secondary | ICD-10-CM | POA: Diagnosis not present

## 2021-08-03 DIAGNOSIS — Z9884 Bariatric surgery status: Secondary | ICD-10-CM | POA: Diagnosis not present

## 2021-08-03 DIAGNOSIS — I517 Cardiomegaly: Secondary | ICD-10-CM | POA: Diagnosis not present

## 2021-08-23 DIAGNOSIS — H6590 Unspecified nonsuppurative otitis media, unspecified ear: Secondary | ICD-10-CM | POA: Diagnosis not present

## 2021-08-23 DIAGNOSIS — J309 Allergic rhinitis, unspecified: Secondary | ICD-10-CM | POA: Diagnosis not present

## 2021-08-24 ENCOUNTER — Telehealth: Payer: Self-pay | Admitting: *Deleted

## 2021-08-24 DIAGNOSIS — I25119 Atherosclerotic heart disease of native coronary artery with unspecified angina pectoris: Secondary | ICD-10-CM

## 2021-08-24 NOTE — Telephone Encounter (Signed)
Informed and verbalized understanding of plan. Copy sent to PCP

## 2021-08-24 NOTE — Telephone Encounter (Signed)
-----   Message from Satira Sark, MD sent at 08/23/2021  4:39 PM EST ----- Results reviewed.  Ascending thoracic aorta measures 44 mm at this point, largely unchanged over time.  Other findings consistent with known cardiovascular disease and previous surgery, also gastric banding.  No significant pulmonary nodules described at this time.  There is mention of hepatic steatosis, LFTs were normal by lab work per PCP last year.  Would make sure that copy of CT goes to his PCP for follow-up of this finding.  Aortic valve described as calcified, this has not been evaluated since echocardiogram in 2019 so would recommend a follow-up study.  We will likely consider a follow-up chest CTA in 1 year.

## 2021-09-15 DIAGNOSIS — E782 Mixed hyperlipidemia: Secondary | ICD-10-CM | POA: Diagnosis not present

## 2021-09-15 DIAGNOSIS — I10 Essential (primary) hypertension: Secondary | ICD-10-CM | POA: Diagnosis not present

## 2021-09-15 DIAGNOSIS — S161XXA Strain of muscle, fascia and tendon at neck level, initial encounter: Secondary | ICD-10-CM | POA: Diagnosis not present

## 2021-09-15 DIAGNOSIS — E7800 Pure hypercholesterolemia, unspecified: Secondary | ICD-10-CM | POA: Diagnosis not present

## 2021-09-15 DIAGNOSIS — E1165 Type 2 diabetes mellitus with hyperglycemia: Secondary | ICD-10-CM | POA: Diagnosis not present

## 2021-09-15 DIAGNOSIS — E7849 Other hyperlipidemia: Secondary | ICD-10-CM | POA: Diagnosis not present

## 2021-09-20 ENCOUNTER — Ambulatory Visit (INDEPENDENT_AMBULATORY_CARE_PROVIDER_SITE_OTHER): Payer: Medicare Other

## 2021-09-20 DIAGNOSIS — I25119 Atherosclerotic heart disease of native coronary artery with unspecified angina pectoris: Secondary | ICD-10-CM | POA: Diagnosis not present

## 2021-09-20 LAB — ECHOCARDIOGRAM COMPLETE
AR max vel: 2.09 cm2
AV Area VTI: 2.12 cm2
AV Area mean vel: 2.02 cm2
AV Mean grad: 10.8 mmHg
AV Peak grad: 17.7 mmHg
AV Vena cont: 0.26 cm
Ao pk vel: 2.11 m/s
Area-P 1/2: 3.01 cm2
MV VTI: 4.15 cm2
P 1/2 time: 581 msec
S' Lateral: 4.12 cm

## 2021-09-22 DIAGNOSIS — S161XXA Strain of muscle, fascia and tendon at neck level, initial encounter: Secondary | ICD-10-CM | POA: Diagnosis not present

## 2021-09-22 DIAGNOSIS — I10 Essential (primary) hypertension: Secondary | ICD-10-CM | POA: Diagnosis not present

## 2021-09-22 DIAGNOSIS — M47812 Spondylosis without myelopathy or radiculopathy, cervical region: Secondary | ICD-10-CM | POA: Diagnosis not present

## 2021-09-28 DIAGNOSIS — I77810 Thoracic aortic ectasia: Secondary | ICD-10-CM | POA: Diagnosis not present

## 2021-09-28 DIAGNOSIS — I70213 Atherosclerosis of native arteries of extremities with intermittent claudication, bilateral legs: Secondary | ICD-10-CM | POA: Diagnosis not present

## 2021-09-28 DIAGNOSIS — I10 Essential (primary) hypertension: Secondary | ICD-10-CM | POA: Diagnosis not present

## 2021-09-28 DIAGNOSIS — Z23 Encounter for immunization: Secondary | ICD-10-CM | POA: Diagnosis not present

## 2021-09-28 DIAGNOSIS — I2510 Atherosclerotic heart disease of native coronary artery without angina pectoris: Secondary | ICD-10-CM | POA: Diagnosis not present

## 2021-09-28 DIAGNOSIS — S161XXA Strain of muscle, fascia and tendon at neck level, initial encounter: Secondary | ICD-10-CM | POA: Diagnosis not present

## 2021-09-28 DIAGNOSIS — E7849 Other hyperlipidemia: Secondary | ICD-10-CM | POA: Diagnosis not present

## 2021-09-28 DIAGNOSIS — Z0001 Encounter for general adult medical examination with abnormal findings: Secondary | ICD-10-CM | POA: Diagnosis not present

## 2021-10-03 DIAGNOSIS — G8929 Other chronic pain: Secondary | ICD-10-CM | POA: Diagnosis not present

## 2021-10-03 DIAGNOSIS — M542 Cervicalgia: Secondary | ICD-10-CM | POA: Diagnosis not present

## 2021-10-03 DIAGNOSIS — R262 Difficulty in walking, not elsewhere classified: Secondary | ICD-10-CM | POA: Diagnosis not present

## 2021-10-10 DIAGNOSIS — G8929 Other chronic pain: Secondary | ICD-10-CM | POA: Diagnosis not present

## 2021-10-10 DIAGNOSIS — M542 Cervicalgia: Secondary | ICD-10-CM | POA: Diagnosis not present

## 2021-10-10 DIAGNOSIS — R262 Difficulty in walking, not elsewhere classified: Secondary | ICD-10-CM | POA: Diagnosis not present

## 2021-10-12 DIAGNOSIS — M542 Cervicalgia: Secondary | ICD-10-CM | POA: Diagnosis not present

## 2021-10-12 DIAGNOSIS — G8929 Other chronic pain: Secondary | ICD-10-CM | POA: Diagnosis not present

## 2021-10-12 DIAGNOSIS — R262 Difficulty in walking, not elsewhere classified: Secondary | ICD-10-CM | POA: Diagnosis not present

## 2021-10-12 DIAGNOSIS — Z800 Family history of malignant neoplasm of digestive organs: Secondary | ICD-10-CM | POA: Diagnosis not present

## 2021-10-19 DIAGNOSIS — G8929 Other chronic pain: Secondary | ICD-10-CM | POA: Diagnosis not present

## 2021-10-19 DIAGNOSIS — R262 Difficulty in walking, not elsewhere classified: Secondary | ICD-10-CM | POA: Diagnosis not present

## 2021-10-19 DIAGNOSIS — M542 Cervicalgia: Secondary | ICD-10-CM | POA: Diagnosis not present

## 2021-12-15 DIAGNOSIS — I110 Hypertensive heart disease with heart failure: Secondary | ICD-10-CM | POA: Diagnosis not present

## 2021-12-15 DIAGNOSIS — E785 Hyperlipidemia, unspecified: Secondary | ICD-10-CM | POA: Diagnosis not present

## 2021-12-15 DIAGNOSIS — Z800 Family history of malignant neoplasm of digestive organs: Secondary | ICD-10-CM | POA: Diagnosis not present

## 2021-12-15 DIAGNOSIS — Z79899 Other long term (current) drug therapy: Secondary | ICD-10-CM | POA: Diagnosis not present

## 2021-12-15 DIAGNOSIS — Z7982 Long term (current) use of aspirin: Secondary | ICD-10-CM | POA: Diagnosis not present

## 2021-12-15 DIAGNOSIS — Z1211 Encounter for screening for malignant neoplasm of colon: Secondary | ICD-10-CM | POA: Diagnosis not present

## 2021-12-15 DIAGNOSIS — I509 Heart failure, unspecified: Secondary | ICD-10-CM | POA: Diagnosis not present

## 2022-01-02 DIAGNOSIS — L03115 Cellulitis of right lower limb: Secondary | ICD-10-CM | POA: Diagnosis not present

## 2022-01-16 ENCOUNTER — Ambulatory Visit: Payer: Medicare Other | Admitting: Cardiology

## 2022-01-16 ENCOUNTER — Encounter: Payer: Self-pay | Admitting: Cardiology

## 2022-01-16 VITALS — BP 130/78 | HR 58 | Ht 68.0 in | Wt 271.0 lb

## 2022-01-16 DIAGNOSIS — I25119 Atherosclerotic heart disease of native coronary artery with unspecified angina pectoris: Secondary | ICD-10-CM

## 2022-01-16 DIAGNOSIS — I350 Nonrheumatic aortic (valve) stenosis: Secondary | ICD-10-CM

## 2022-01-16 DIAGNOSIS — I7121 Aneurysm of the ascending aorta, without rupture: Secondary | ICD-10-CM | POA: Diagnosis not present

## 2022-01-16 MED ORDER — FUROSEMIDE 20 MG PO TABS: 20.0000 mg | ORAL_TABLET | Freq: Every day | ORAL | 1 refills | Status: DC | PRN

## 2022-01-16 NOTE — Progress Notes (Signed)
Cardiology Office Note  Date: 01/16/2022   ID: [name], DOB 05/25/1951, MRN [id number]  PCP:  Curlene Labrum, MD  Cardiologist:  Rozann Lesches, MD Electrophysiologist:  None   Chief Complaint  Patient presents with   Cardiac follow-up    History of Present Illness: [name] is a 71 y.o. male last seen in January.  He is here for a routine visit.  Reports no angina symptoms or nitroglycerin use.  Did have an interval episode of right leg swelling and apparent cellulitis, treated with antibiotics as an outpatient per [facility].  Does have some residual right foot swelling which happens periodically.  We discussed getting a prescription for low-dose Lasix to use as needed.  CTA in February showed stable aneurysmal dilatation of the ascending thoracic aorta measuring 44 mm.  Echocardiogram in March revealed LVEF 55 to 60% with mild aortic stenosis and mean gradient approximately 11 mmHg.  We went over these results today.  I personally reviewed his ECG which shows sinus rhythm with prolonged PR interval and sinus arrhythmia.  Old anterior infarct pattern and nonspecific ST-T changes noted.  He does report chronic arthritic pains, has tried various medications over the years, most recently has been using naproxen with fairly good relief.  As far as increased cardiovascular risk with NSAIDs, naproxen tends to be the best option, although there are other obvious concerns with long-term NSAID use.  I have asked him to continue to discuss this with his PCP.  Past Medical History:  Diagnosis Date   Aortic root dilatation (HCC)    Arthritis    Coronary atherosclerosis of native coronary artery    Multiple stents - RCA/LAD/diagonal, overlapping DES LAD, stent thrombosis 2005, ultimately CABG   Essential hypertension    H/O hiatal hernia    History of GI bleed    On nonsteroidals   Hyperlipemia    Myocardial infarction [facility])    Anterior with LAD stent thrombosis 2005,  2004 and 2005    Obesity    Paroxysmal atrial flutter ([place])    Sleep apnea    CPAP daily    Past Surgical History:  Procedure Laterality Date   APPENDECTOMY     BREATH TEK H PYLORI  06/11/2012   Procedure: BREATH TEK H PYLORI;  Surgeon: Shann Medal, MD;  Location: [facility];  Service: General;  Laterality: N/A;  [place] Right 6/12   CARPAL TUNNEL RELEASE  06/06/2011   Procedure: CARPAL TUNNEL RELEASE;  Surgeon: Cammie Sickle., MD;  Location: [place];  Service: Orthopedics;  Laterality: Right;   CARPAL TUNNEL RELEASE Right 04/10/2013   Procedure: REVISION OF CARPAL TUNNEL RELEASE LIGAMENT RIGHT WRIST;  Surgeon: Cammie Sickle., MD;  Location: [place];  Service: Orthopedics;  Laterality: Right;   CERVICAL FUSION  9/10   CHOLECYSTECTOMY     CORONARY ANGIOPLASTY WITH STENT PLACEMENT  [id number]   CORONARY ARTERY BYPASS GRAFT N/A 12/30/2013   Procedure: CORONARY ARTERY BYPASS GRAFTING (CABG) x three, using left internal mammary artery and right leg greater sapheneous vein harvested endoscopically - LIMA to LAD, SVG-D, SVG-OM1;  Surgeon: Ivin Poot, MD;  Location: [place];  Service: Open Heart Surgery;  Laterality: N/A;   ELBOW ARTHROSCOPY Right    IRRIGATION AND DEBRIDEMENT SHOULDER Right 04/30/2020   Procedure: Right shoulder poly exchange and possible glensphere exchange and left shoulder injection;  Surgeon:  Netta Cedars, MD;  [facility];  Service: Orthopedics;  Laterality: Right;   LAPAROSCOPIC GASTRIC BANDING  3/14   LEFT HEART CATHETERIZATION WITH CORONARY ANGIOGRAM N/A 12/26/2013   Procedure: LEFT HEART CATHETERIZATION WITH CORONARY ANGIOGRAM;  Surgeon: Leonie Man, MD;  Location: [facility] CATH LAB;  Service: Cardiovascular;  Laterality: N/A;   REVERSE SHOULDER ARTHROPLASTY Right 01/30/2020   Procedure: REVERSE SHOULDER ARTHROPLASTY;  Surgeon: Netta Cedars, MD;  [facility];  Service:  Orthopedics;  Laterality: Right;  interscalene block   SHOULDER ARTHROSCOPY Right 4/11   TOTAL KNEE ARTHROPLASTY Right 2006   TOTAL KNEE ARTHROPLASTY Left 2007   ULNAR NERVE TRANSPOSITION  06/06/2011   Procedure: ULNAR NERVE DECOMPRESSION/TRANSPOSITION;  Surgeon: Cammie Sickle., MD;  Location: [place];  Service: Orthopedics;  Laterality: Right;  decompression ulnar nerve right cubital tunnel     Current Outpatient Medications  Medication Sig Dispense Refill   aspirin 81 MG EC tablet Take 81 mg by mouth at bedtime.      atorvastatin (LIPITOR) 20 MG tablet TAKE 1 TABLET ONCE DAILY. (Patient taking differently: Take 20 mg by mouth every evening.) 30 tablet 6   Cholecalciferol (VITAMIN D) 125 MCG (5000 UT) CAPS Take 5,000 Units by mouth daily.      Coenzyme Q10 (CO Q-10) 100 MG CAPS Take 100 mg by mouth daily.      cyclobenzaprine (FLEXERIL) 10 MG tablet Take 1 tablet (10 mg total) by mouth 3 (three) times daily as needed for muscle spasms. 30 tablet 0   EPINEPHrine 0.3 mg/0.3 mL IJ SOAJ injection Inject 0.3 mLs into the skin as needed for anaphylaxis.      furosemide (LASIX) 20 MG tablet Take 1 tablet (20 mg total) by mouth daily as needed (leg swelling). 90 tablet 1   losartan (COZAAR) 100 MG tablet Take 100 mg by mouth daily.     metoprolol (LOPRESSOR) 50 MG tablet Take 50 mg by mouth 2 (two) times daily.     naproxen (NAPROSYN) 500 MG tablet Take 500 mg by mouth 2 (two) times daily with a meal.      nitroGLYCERIN (NITROSTAT) 0.4 MG SL tablet Place 0.4 mg under the tongue every 5 (five) minutes as needed for chest pain. Up to 3x     OLIVE LEAF EXTRACT PO Take 750 mg by mouth daily.      Omega-3 Fatty Acids (OMEGA 3 500) 500 MG CAPS Take 500 mg by mouth daily.      Pomegranate, Punica granatum, (POMEGRANATE PO) Take 2 capsules by mouth daily.      Tadalafil 2.5 MG TABS Take 2.5 mg by mouth at bedtime.      Turmeric 500 MG TABS Take 500 mg by mouth 2 (two) times daily.       b complex vitamins tablet Take 1 tablet by mouth daily. (Patient not taking: Reported on 01/16/2022)     Multiple Vitamins-Minerals (EYE VITAMINS PO) Take 1 capsule by mouth daily.  (Patient not taking: Reported on 01/16/2022)     No current facility-administered medications for this visit.   Allergies:  Other and Chlorhexidine   ROS: No palpitations or syncope.  Chronic arthritic pain.  Physical Exam: VS:  BP 130/78 (BP Location: Right Arm, Patient Position: Sitting, Cuff Size: Large)   Pulse (!) 58   Ht '5\' 8"'$  (1.727 m)   Wt 271 lb (122.9 kg)   SpO2 98%   BMI 41.21 kg/m , BMI Body mass index is 41.21  kg/m.  Wt Readings from Last 3 Encounters:  01/16/22 271 lb (122.9 kg)  07/19/21 287 lb 3.2 oz (130.3 kg)  01/06/21 266 lb 3.2 oz (120.7 kg)    General: Patient appears comfortable at rest. HEENT: Conjunctiva and lids normal, oropharynx clear. Neck: Supple, no elevated JVP or carotid bruits, no thyromegaly. Lungs: Clear to auscultation, nonlabored breathing at rest. Cardiac: Regular rate and rhythm, no S3, 1/6 systolic murmur. Extremities: 2+ right foot and ankle edema..  ECG:  An ECG dated 01/06/2021 was personally reviewed today and demonstrated:  Sinus rhythm with prolonged PR interval, poor R wave progression.  Recent Labwork:  January 2023: Potassium 3.8, BUN 28, creatinine 1.04  Other Studies Reviewed Today:  Chest CTA 08/03/2021: 1. Stable mild uncomplicated fusiform aneurysmal dilatation of the  ascending thoracic aorta measuring 44 mm in diameter, unchanged  compared to the 01/2014 examination. Aortic aneurysm NOS  (ICD10-I71.9).  2. Post median sternotomy and CABG with atherosclerotic plaque and  suspected stents within the native coronary arteries. Aortic  Atherosclerosis (ICD10-I70.0).  3. Borderline cardiomegaly with suspected calcifications involving  the aortic valve leaflets, nonspecific though could be seen in the  setting of aortic valvular pathology.  Further evaluation cardiac  echo could performed as indicated.  4. Suspected hepatic steatosis.  Correlation with LFTs is advised.  5. Stable sequela of gastric banding.   Echocardiogram 09/20/2021:  1. Left ventricular ejection fraction, by estimation, is 55 to 60%. The  left ventricle has normal function. Left ventricular endocardial border  not optimally defined to evaluate regional wall motion. There is mild left  ventricular hypertrophy. Left  ventricular diastolic parameters are consistent with Grade I diastolic  dysfunction (impaired relaxation).   2. Right ventricular systolic function was not well visualized. The right  ventricular size is not well visualized. Tricuspid regurgitation signal is  inadequate for assessing PA pressure.   3. The mitral valve is abnormal. Trivial mitral valve regurgitation. No  evidence of mitral stenosis.   4. The aortic valve is tricuspid. There is moderate calcification of the  aortic valve. There is moderate thickening of the aortic valve. Aortic  valve regurgitation is mild. Mild aortic valve stenosis. Aortic valve mean  gradient measures 10.8 mmHg.  Aortic valve peak gradient measures 17.7 mmHg. Aortic valve area, by VTI  measures 2.12 cm.   5. Aortic dilatation noted. There is mild dilatation of the aortic root,  measuring 41 mm. There is moderate dilatation of the ascending aorta,  measuring 44 mm.   6. The inferior vena cava is normal in size with greater than 50%  respiratory variability, suggesting right atrial pressure of 3 mmHg.   Assessment and Plan:  1.  Intermittent right leg edema, recent episode of cellulitis per discussion.  Plan to prescribe Lasix 20 mg to be taken as needed.  This was the side of vein harvesting as well.  2.  CAD status post multiple stent interventions and ultimately CABG.  No active angina at this time with follow-up Myoview in 2021 low risk showing scar with mild peri-infarct ischemia.  LVEF 55 to 60%.   Continue aspirin, Cozaar, Lopressor, and Lipitor.  3.  Stable ascending thoracic aortic aneurysm measuring 44 mm by recent chest CTA.  He is asymptomatic.  4.  Mild calcific aortic stenosis with mean gradient approximately 11 mmHg by most recent echocardiogram.  He is asymptomatic.  5.  History of pulmonary nodules, no discrete nodules described by most recent chest CT.  6.  Chronic arthritic  pain.  He uses naproxen with follow-up by PCP.  From a cardiovascular perspective, naproxen tends to be the most favorable NSAID, although there are other obvious concerns for long-term NSAID use.  Medication Adjustments/Labs and Tests Ordered: Current medicines are reviewed at length with the patient today.  Concerns regarding medicines are outlined above.   Tests Ordered: Orders Placed This Encounter  Procedures   EKG 12-Lead    Medication Changes: Meds ordered this encounter  Medications   furosemide (LASIX) 20 MG tablet    Sig: Take 1 tablet (20 mg total) by mouth daily as needed (leg swelling).    Dispense:  90 tablet    Refill:  1    01/16/2022 NEW    Disposition:  Follow up  6 months.  Signed, Satira Sark, MD, [facility] 01/16/2022 10:46 AM    [facility] at [place], [place], [place] [address] Phone: [phone]; Fax: [phone]

## 2022-01-16 NOTE — Patient Instructions (Addendum)
Medication Instructions:  Your physician has recommended you make the following change in your medication: \ Start furosemide 20 mg daily as needed for leg swelling Continue other medications the same  Labwork: none  Testing/Procedures: none  Follow-Up: Your physician recommends that you schedule a follow-up appointment in: 6 months  Any Other Special Instructions Will Be Listed Below (If Applicable).  If you need a refill on your cardiac medications before your next appointment, please call your pharmacy.

## 2022-02-15 DIAGNOSIS — M65839 Other synovitis and tenosynovitis, unspecified forearm: Secondary | ICD-10-CM | POA: Diagnosis not present

## 2022-02-15 DIAGNOSIS — M13831 Other specified arthritis, right wrist: Secondary | ICD-10-CM | POA: Diagnosis not present

## 2022-02-15 DIAGNOSIS — M65332 Trigger finger, left middle finger: Secondary | ICD-10-CM | POA: Diagnosis not present

## 2022-02-15 DIAGNOSIS — M79645 Pain in left finger(s): Secondary | ICD-10-CM | POA: Diagnosis not present

## 2022-02-15 DIAGNOSIS — M19042 Primary osteoarthritis, left hand: Secondary | ICD-10-CM | POA: Diagnosis not present

## 2022-02-15 DIAGNOSIS — M67431 Ganglion, right wrist: Secondary | ICD-10-CM | POA: Diagnosis not present

## 2022-03-27 DIAGNOSIS — E7849 Other hyperlipidemia: Secondary | ICD-10-CM | POA: Diagnosis not present

## 2022-03-27 DIAGNOSIS — I10 Essential (primary) hypertension: Secondary | ICD-10-CM | POA: Diagnosis not present

## 2022-03-27 DIAGNOSIS — E1165 Type 2 diabetes mellitus with hyperglycemia: Secondary | ICD-10-CM | POA: Diagnosis not present

## 2022-03-31 DIAGNOSIS — M47812 Spondylosis without myelopathy or radiculopathy, cervical region: Secondary | ICD-10-CM | POA: Diagnosis not present

## 2022-03-31 DIAGNOSIS — I2510 Atherosclerotic heart disease of native coronary artery without angina pectoris: Secondary | ICD-10-CM | POA: Diagnosis not present

## 2022-03-31 DIAGNOSIS — M159 Polyosteoarthritis, unspecified: Secondary | ICD-10-CM | POA: Diagnosis not present

## 2022-03-31 DIAGNOSIS — J309 Allergic rhinitis, unspecified: Secondary | ICD-10-CM | POA: Diagnosis not present

## 2022-03-31 DIAGNOSIS — G4733 Obstructive sleep apnea (adult) (pediatric): Secondary | ICD-10-CM | POA: Diagnosis not present

## 2022-03-31 DIAGNOSIS — I10 Essential (primary) hypertension: Secondary | ICD-10-CM | POA: Diagnosis not present

## 2022-05-06 IMAGING — DX DG SHOULDER 2+V PORT*R*
1 series · 1 of 1 positions shown · non-contrast
Comparison: None.

CLINICAL DATA: Right shoulder replacement

EXAM:
PORTABLE RIGHT SHOULDER

[shoulder ap]
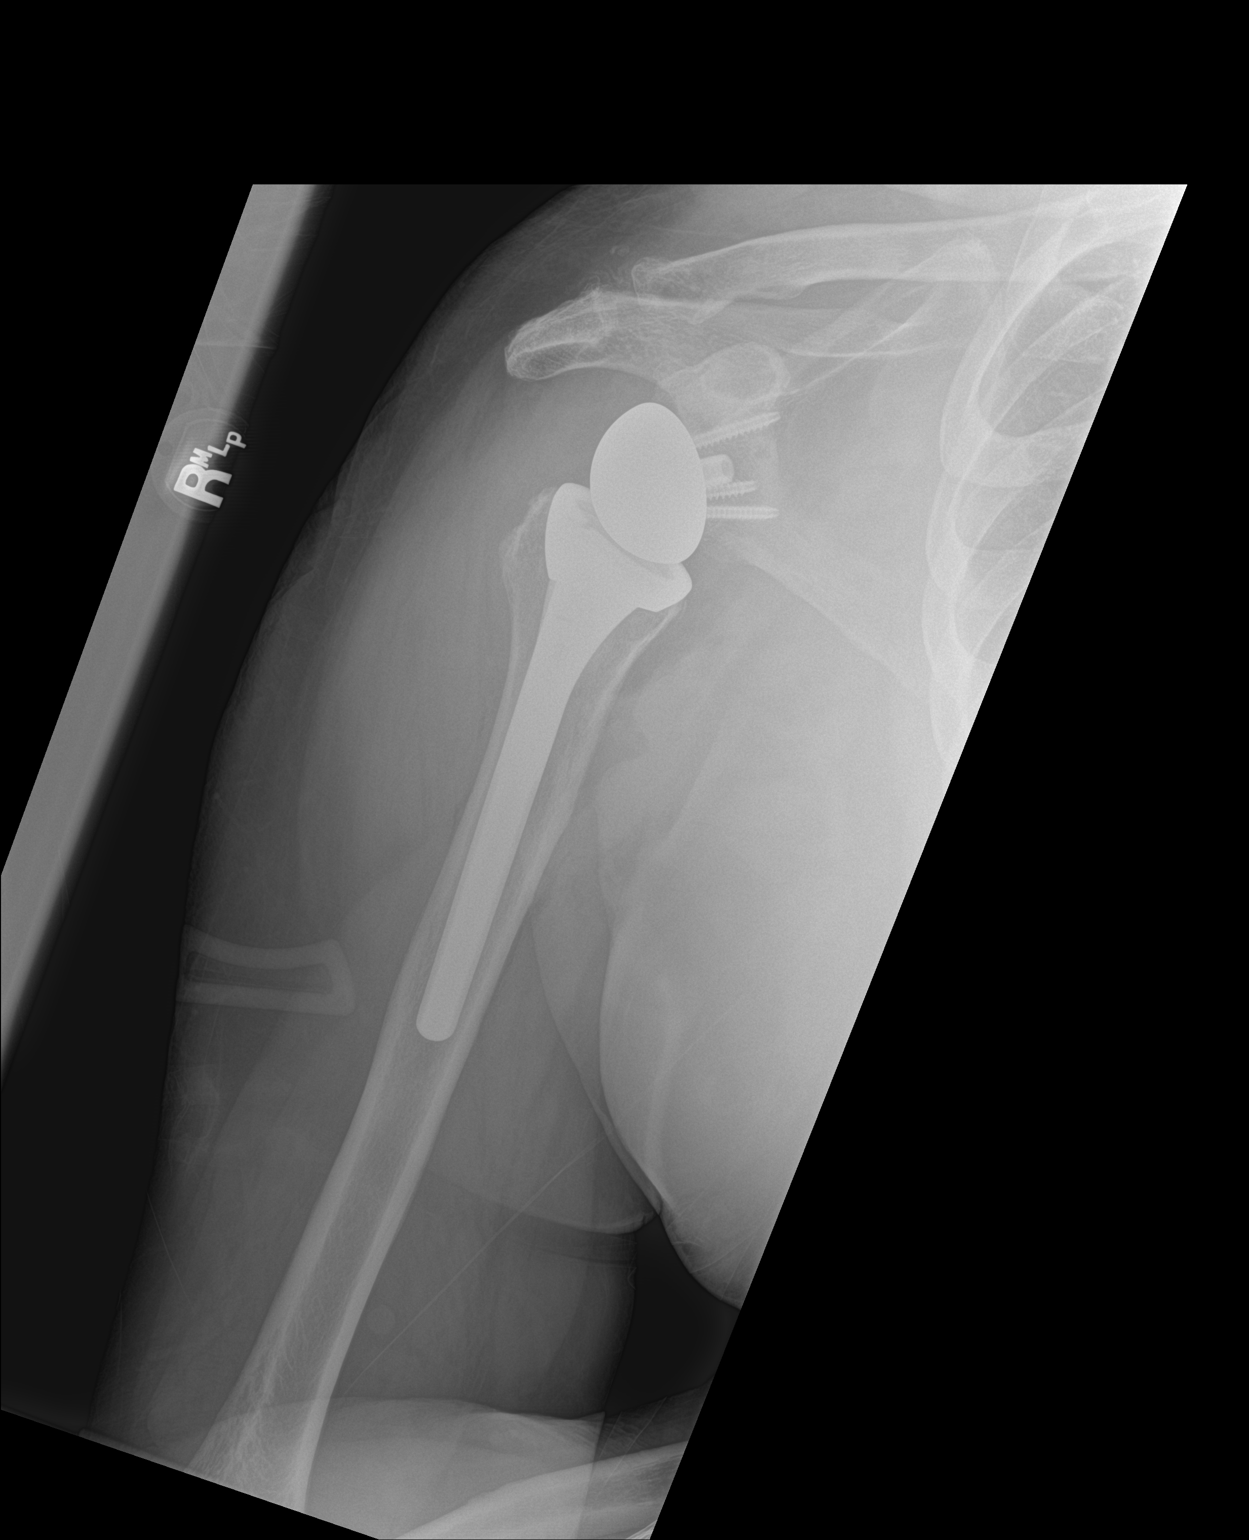

[1 of 1 positions shown; findings below may reference images not displayed]

FINDINGS: Changes of right shoulder replacement. Normal AP alignment.
Degenerative changes in the AC joint. No hardware bony complicating
feature.
IMPRESSION: Right shoulder replacement.  No visible complicating feature.

## 2022-06-07 DIAGNOSIS — G4733 Obstructive sleep apnea (adult) (pediatric): Secondary | ICD-10-CM | POA: Diagnosis not present

## 2022-06-07 DIAGNOSIS — J329 Chronic sinusitis, unspecified: Secondary | ICD-10-CM | POA: Diagnosis not present

## 2022-06-07 DIAGNOSIS — E7849 Other hyperlipidemia: Secondary | ICD-10-CM | POA: Diagnosis not present

## 2022-06-07 DIAGNOSIS — R030 Elevated blood-pressure reading, without diagnosis of hypertension: Secondary | ICD-10-CM | POA: Diagnosis not present

## 2022-06-07 DIAGNOSIS — I739 Peripheral vascular disease, unspecified: Secondary | ICD-10-CM | POA: Diagnosis not present

## 2022-06-07 DIAGNOSIS — I70213 Atherosclerosis of native arteries of extremities with intermittent claudication, bilateral legs: Secondary | ICD-10-CM | POA: Diagnosis not present

## 2022-06-08 DIAGNOSIS — R6889 Other general symptoms and signs: Secondary | ICD-10-CM | POA: Diagnosis not present

## 2022-06-13 ENCOUNTER — Other Ambulatory Visit: Payer: Self-pay | Admitting: Dentistry

## 2022-06-13 DIAGNOSIS — M2669 Other specified disorders of temporomandibular joint: Secondary | ICD-10-CM

## 2022-07-01 ENCOUNTER — Ambulatory Visit
Admission: RE | Admit: 2022-07-01 | Discharge: 2022-07-01 | Disposition: A | Discharge status: Home / Self Care | Payer: Medicare Other | Source: Ambulatory Visit | Attending: Dentistry | Admitting: Dentistry

## 2022-07-01 DIAGNOSIS — M26602 Left temporomandibular joint disorder, unspecified: Secondary | ICD-10-CM | POA: Diagnosis not present

## 2022-07-01 DIAGNOSIS — M2669 Other specified disorders of temporomandibular joint: Secondary | ICD-10-CM

## 2022-07-13 DIAGNOSIS — H02834 Dermatochalasis of left upper eyelid: Secondary | ICD-10-CM | POA: Diagnosis not present

## 2022-07-13 DIAGNOSIS — H1045 Other chronic allergic conjunctivitis: Secondary | ICD-10-CM | POA: Diagnosis not present

## 2022-07-13 DIAGNOSIS — R6889 Other general symptoms and signs: Secondary | ICD-10-CM | POA: Diagnosis not present

## 2022-07-13 DIAGNOSIS — H353131 Nonexudative age-related macular degeneration, bilateral, early dry stage: Secondary | ICD-10-CM | POA: Diagnosis not present

## 2022-07-13 DIAGNOSIS — H25813 Combined forms of age-related cataract, bilateral: Secondary | ICD-10-CM | POA: Diagnosis not present

## 2022-07-13 DIAGNOSIS — H02831 Dermatochalasis of right upper eyelid: Secondary | ICD-10-CM | POA: Diagnosis not present

## 2022-07-13 DIAGNOSIS — H04123 Dry eye syndrome of bilateral lacrimal glands: Secondary | ICD-10-CM | POA: Diagnosis not present

## 2022-07-18 NOTE — Progress Notes (Signed)
Cardiology Office Note  Date: 07/19/2022   ID: [name], DOB 1951/06/24, MRN [id number]  PCP:  Curlene Labrum, MD  Cardiologist:  Rozann Lesches, MD Electrophysiologist:  None   Chief Complaint  Patient presents with   Cardiac follow-up    History of Present Illness: [name] is a 72 y.o. male last seen in July 2023.  He is here for a routine visit.  Reports no angina and stable NYHA class II dyspnea.  He is working on weight loss through counting calories, exercise, and medications (currently on Zepbound).  We discussed getting an updated chest CTA for follow-up of ascending thoracic aortic aneurysm, 44 mm by evaluation last year.  I went over his medications and most recent lab work.  He has follow-up with Dr. Pleas Koch in the next few months.  His last LDL was 78 with HDL 56.  Echocardiogram from last year showed normal LVEF and evidence of mild aortic stenosis with mean AV gradient approximately 11 mmHg.  Past Medical History:  Diagnosis Date   Aortic root dilatation (HCC)    Arthritis    Coronary atherosclerosis of native coronary artery    Multiple stents - RCA/LAD/diagonal, overlapping DES LAD, stent thrombosis 2005, ultimately CABG   Essential hypertension    H/O hiatal hernia    History of GI bleed    On nonsteroidals   Hyperlipemia    Myocardial infarction [facility])    Anterior with LAD stent thrombosis 2005, 2004 and 2005    Obesity    Paroxysmal atrial flutter (HCC)    Sleep apnea    CPAP daily    Current Outpatient Medications  Medication Sig Dispense Refill   aspirin 81 MG EC tablet Take 81 mg by mouth at bedtime.      atorvastatin (LIPITOR) 20 MG tablet TAKE 1 TABLET ONCE DAILY. 30 tablet 6   b complex vitamins tablet Take 1 tablet by mouth daily.     Cholecalciferol (VITAMIN D) 125 MCG (5000 UT) CAPS Take 5,000 Units by mouth daily.      Coenzyme Q10 (CO Q-10) 100 MG CAPS Take 100 mg by mouth daily.      cyclobenzaprine (FLEXERIL)  10 MG tablet Take 1 tablet (10 mg total) by mouth 3 (three) times daily as needed for muscle spasms. 30 tablet 0   EPINEPHrine 0.3 mg/0.3 mL IJ SOAJ injection Inject 0.3 mLs into the skin as needed for anaphylaxis.      losartan (COZAAR) 100 MG tablet Take 100 mg by mouth daily.     metoprolol (LOPRESSOR) 50 MG tablet Take 50 mg by mouth 2 (two) times daily.     Multiple Vitamins-Minerals (EYE VITAMINS PO) Take 1 capsule by mouth daily.     naproxen (NAPROSYN) 500 MG tablet Take 500 mg by mouth daily.     nitroGLYCERIN (NITROSTAT) 0.4 MG SL tablet Place 0.4 mg under the tongue every 5 (five) minutes as needed for chest pain. Up to 3x     Omega-3 Fatty Acids (OMEGA 3 500) 500 MG CAPS Take 500 mg by mouth daily.      Pomegranate, [place] gran[place], (POMEGRANATE PO) Take 2 capsules by mouth daily.      Tadalafil 2.5 MG TABS Take 2.5 mg by mouth every morning.     Turmeric 500 MG TABS Take 500 mg by mouth 2 (two) times daily.      ZEPBOUND 2.5 MG/0.5ML Pen Inject 2.5 mg into the skin once a week.  No current facility-administered medications for this visit.   Allergies:  Bee venom, Other, and Chlorhexidine   ROS: No syncope.  Physical Exam: VS:  BP 116/68   Pulse 70   Ht '5\' 8"'$  (1.727 m)   Wt 260 lb 9.6 oz (118.2 kg)   SpO2 93%   BMI 39.62 kg/m , BMI Body mass index is 39.62 kg/m.  Wt Readings from Last 3 Encounters:  07/19/22 260 lb 9.6 oz (118.2 kg)  01/16/22 271 lb (122.9 kg)  07/19/21 287 lb 3.2 oz (130.3 kg)    General: Patient appears comfortable at rest. HEENT: Conjunctiva and lids normal. Neck: Supple, no elevated JVP or carotid bruits. Lungs: Clear to auscultation, nonlabored breathing at rest. Cardiac: Regular rate and rhythm, no S3, 2/6 systolic murmur. Extremities: No pitting edema.  ECG:  An ECG dated 01/16/2022 was personally reviewed today and demonstrated:  Sinus rhythm with prolonged PR interval, old anterior infarct pattern, nonspecific ST-T changes.  Recent  Labwork:  March 2023: Hemoglobin 14.0, platelets 193, BUN 19, creatinine 0.78, potassium 4.4, AST 14, ALT 16, cholesterol 148, triglycerides 68, HDL 56, LDL 78  Other Studies Reviewed Today:  Chest CTA 08/03/2021: 1. Stable mild uncomplicated fusiform aneurysmal dilatation of the  ascending thoracic aorta measuring 44 mm in diameter, unchanged  compared to the 01/2014 examination. Aortic aneurysm NOS  (ICD10-I71.9).  2. Post median sternotomy and CABG with atherosclerotic plaque and  suspected stents within the native coronary arteries. Aortic  Atherosclerosis (ICD10-I70.0).  3. Borderline cardiomegaly with suspected calcifications involving  the aortic valve leaflets, nonspecific though could be seen in the  setting of aortic valvular pathology. Further evaluation cardiac  echo could performed as indicated.  4. Suspected hepatic steatosis.  Correlation with LFTs is advised.  5. Stable sequela of gastric banding.    Echocardiogram 09/20/2021:  1. Left ventricular ejection fraction, by estimation, is 55 to 60%. The  left ventricle has normal function. Left ventricular endocardial border  not optimally defined to evaluate regional wall motion. There is mild left  ventricular hypertrophy. Left  ventricular diastolic parameters are consistent with Grade I diastolic  dysfunction (impaired relaxation).   2. Right ventricular systolic function was not well visualized. The right  ventricular size is not well visualized. Tricuspid regurgitation signal is  inadequate for assessing PA pressure.   3. The mitral valve is abnormal. Trivial mitral valve regurgitation. No  evidence of mitral stenosis.   4. The aortic valve is tricuspid. There is moderate calcification of the  aortic valve. There is moderate thickening of the aortic valve. Aortic  valve regurgitation is mild. Mild aortic valve stenosis. Aortic valve mean  gradient measures 10.8 mmHg.  Aortic valve peak gradient measures 17.7 mmHg.  Aortic valve area, by VTI  measures 2.12 cm.   5. Aortic dilatation noted. There is mild dilatation of the aortic root,  measuring 41 mm. There is moderate dilatation of the ascending aorta,  measuring 44 mm.   6. The inferior vena cava is normal in size with greater than 50%  respiratory variability, suggesting right atrial pressure of 3 mmHg.   Assessment and Plan:  1.  Asymptomatic ascending thoracic aortic aneurysm measuring 44 mm by chest CTA last year.  Follow-up imaging will be obtained.  2.  Mild calcific aortic stenosis with mean gradient 11 mmHg by echocardiogram last year.  No change in cardiac murmur.  3.  CAD status post multiple stent interventions and subsequently CABG.  He reports no active angina and  underwent a low risk Myoview in 2021.  Continue aspirin, Cozaar, Lopressor, Lipitor, and as needed nitroglycerin.  Medication Adjustments/Labs and Tests Ordered: Current medicines are reviewed at length with the patient today.  Concerns regarding medicines are outlined above.   Tests Ordered: Orders Placed This Encounter  Procedures   CT ANGIO CHEST AORTA W/CM & OR WO/CM   Basic metabolic panel    Medication Changes: No orders of the defined types were placed in this encounter.   Disposition:  Follow up  6 months.  Signed, Satira Sark, MD, [facility] 07/19/2022 10:46 AM    [facility], [place], [place] [address] Phone: [phone]; Fax: [phone]

## 2022-07-19 ENCOUNTER — Encounter: Payer: Self-pay | Admitting: Cardiology

## 2022-07-19 ENCOUNTER — Ambulatory Visit: Payer: Medicare Other | Attending: Cardiology | Admitting: Cardiology

## 2022-07-19 VITALS — BP 116/68 | HR 70 | Ht 68.0 in | Wt 260.6 lb

## 2022-07-19 DIAGNOSIS — I350 Nonrheumatic aortic (valve) stenosis: Secondary | ICD-10-CM

## 2022-07-19 DIAGNOSIS — I25119 Atherosclerotic heart disease of native coronary artery with unspecified angina pectoris: Secondary | ICD-10-CM

## 2022-07-19 DIAGNOSIS — I7121 Aneurysm of the ascending aorta, without rupture: Secondary | ICD-10-CM | POA: Diagnosis not present

## 2022-07-19 NOTE — Patient Instructions (Addendum)
Medication Instructions:  Your physician recommends that you continue on your current medications as directed. Please refer to the Current Medication list given to you today.  Labwork: BMET today at [facility] or [employer]  Testing/Procedures: [place]: Your physician recommends that you schedule a follow-up appointment in: 6 months  Any Other Special Instructions Will Be Listed Below (If Applicable).  If you need a refill on your cardiac medications before your next appointment, please call your pharmacy.

## 2022-07-21 DIAGNOSIS — I25119 Atherosclerotic heart disease of native coronary artery with unspecified angina pectoris: Secondary | ICD-10-CM | POA: Diagnosis not present

## 2022-07-21 DIAGNOSIS — I7121 Aneurysm of the ascending aorta, without rupture: Secondary | ICD-10-CM | POA: Diagnosis not present

## 2022-07-26 DIAGNOSIS — M25512 Pain in left shoulder: Secondary | ICD-10-CM | POA: Diagnosis not present

## 2022-07-31 DIAGNOSIS — I7121 Aneurysm of the ascending aorta, without rupture: Secondary | ICD-10-CM | POA: Diagnosis not present

## 2022-07-31 DIAGNOSIS — R918 Other nonspecific abnormal finding of lung field: Secondary | ICD-10-CM | POA: Diagnosis not present

## 2022-07-31 DIAGNOSIS — I2510 Atherosclerotic heart disease of native coronary artery without angina pectoris: Secondary | ICD-10-CM | POA: Diagnosis not present

## 2022-07-31 DIAGNOSIS — I2584 Coronary atherosclerosis due to calcified coronary lesion: Secondary | ICD-10-CM | POA: Diagnosis not present

## 2022-07-31 DIAGNOSIS — Z951 Presence of aortocoronary bypass graft: Secondary | ICD-10-CM | POA: Diagnosis not present

## 2022-08-02 ENCOUNTER — Telehealth: Payer: Self-pay | Admitting: Cardiology

## 2022-08-02 NOTE — Telephone Encounter (Signed)
Mychart message sent with result Copy sent to PCP

## 2022-08-02 NOTE — Telephone Encounter (Signed)
Chest CTA results obtained through [facility] health care are as follows:  1. Stable fusiform aneurysm of the ascending thoracic aorta  measuring up to 4.2 cm. Recommend annual imaging followup by CTA or  MRA. This recommendation follows 2010  ACCF/AHA/AATS/ACR/ASA/SCA/SCAI/SIR/STS/SVM Guidelines for the  Diagnosis and Management of Patients with Thoracic Aortic Disease.  Circulation. 2010; 121: I680-H212. Aortic aneurysm NOS (ICD10-I71.9)  2. No acute chest abnormality.  3. Irregular pleural-based densities in medial right lower lobe may  have slightly increased since 2023. Overall, there is minimal change  since 2021 but recommend continued surveillance due to concern for  interval growth. Recommend a follow-up chest CT in 3-4 months to  ensure stability.  4. Coronary artery calcifications and status post CABG procedure.    Please let him know that the ascending thoracic aortic size is stable, 4.2 cm and this can be reimaged in 1 year.  There is also mention of pleural-based densities in the medial right lower lobe that are described as being relatively stable but still warrant reimaging in 3 to 4 months.  This is not related to his heart.  Please forward results to Dr. Pleas Koch.  It is recommended that he have a follow-up chest CT in 3 to 4 months, this could be a noncontrasted CT I suspect.  Satira Sark, M.D., F.A.C.C.

## 2022-09-27 DIAGNOSIS — Z0001 Encounter for general adult medical examination with abnormal findings: Secondary | ICD-10-CM | POA: Diagnosis not present

## 2022-09-27 DIAGNOSIS — E1165 Type 2 diabetes mellitus with hyperglycemia: Secondary | ICD-10-CM | POA: Diagnosis not present

## 2022-09-27 DIAGNOSIS — Z1329 Encounter for screening for other suspected endocrine disorder: Secondary | ICD-10-CM | POA: Diagnosis not present

## 2022-09-27 DIAGNOSIS — E7849 Other hyperlipidemia: Secondary | ICD-10-CM | POA: Diagnosis not present

## 2022-10-17 DIAGNOSIS — M19012 Primary osteoarthritis, left shoulder: Secondary | ICD-10-CM | POA: Diagnosis not present

## 2022-10-18 ENCOUNTER — Other Ambulatory Visit (HOSPITAL_COMMUNITY): Payer: Self-pay | Admitting: Orthopedic Surgery

## 2022-10-18 DIAGNOSIS — M25512 Pain in left shoulder: Secondary | ICD-10-CM

## 2022-10-20 DIAGNOSIS — I739 Peripheral vascular disease, unspecified: Secondary | ICD-10-CM | POA: Diagnosis not present

## 2022-10-20 DIAGNOSIS — E7849 Other hyperlipidemia: Secondary | ICD-10-CM | POA: Diagnosis not present

## 2022-10-20 DIAGNOSIS — R030 Elevated blood-pressure reading, without diagnosis of hypertension: Secondary | ICD-10-CM | POA: Diagnosis not present

## 2022-10-20 DIAGNOSIS — I2510 Atherosclerotic heart disease of native coronary artery without angina pectoris: Secondary | ICD-10-CM | POA: Diagnosis not present

## 2022-10-20 DIAGNOSIS — Z0001 Encounter for general adult medical examination with abnormal findings: Secondary | ICD-10-CM | POA: Diagnosis not present

## 2022-10-20 DIAGNOSIS — M4807 Spinal stenosis, lumbosacral region: Secondary | ICD-10-CM | POA: Diagnosis not present

## 2022-10-20 DIAGNOSIS — G4733 Obstructive sleep apnea (adult) (pediatric): Secondary | ICD-10-CM | POA: Diagnosis not present

## 2022-10-20 DIAGNOSIS — J329 Chronic sinusitis, unspecified: Secondary | ICD-10-CM | POA: Diagnosis not present

## 2022-10-20 DIAGNOSIS — M47812 Spondylosis without myelopathy or radiculopathy, cervical region: Secondary | ICD-10-CM | POA: Diagnosis not present

## 2022-10-20 DIAGNOSIS — I70213 Atherosclerosis of native arteries of extremities with intermittent claudication, bilateral legs: Secondary | ICD-10-CM | POA: Diagnosis not present

## 2022-10-20 DIAGNOSIS — E1165 Type 2 diabetes mellitus with hyperglycemia: Secondary | ICD-10-CM | POA: Diagnosis not present

## 2022-11-02 DIAGNOSIS — I700 Atherosclerosis of aorta: Secondary | ICD-10-CM | POA: Diagnosis not present

## 2022-11-02 DIAGNOSIS — J984 Other disorders of lung: Secondary | ICD-10-CM | POA: Diagnosis not present

## 2022-11-02 DIAGNOSIS — J929 Pleural plaque without asbestos: Secondary | ICD-10-CM | POA: Diagnosis not present

## 2022-11-02 DIAGNOSIS — R918 Other nonspecific abnormal finding of lung field: Secondary | ICD-10-CM | POA: Diagnosis not present

## 2022-12-04 ENCOUNTER — Ambulatory Visit (HOSPITAL_COMMUNITY): Payer: Medicare Other

## 2022-12-07 ENCOUNTER — Ambulatory Visit (HOSPITAL_COMMUNITY)
Admission: RE | Admit: 2022-12-07 | Discharge: 2022-12-07 | Disposition: A | Discharge status: Home / Self Care | Payer: Medicare Other | Source: Ambulatory Visit | Attending: Orthopedic Surgery | Admitting: Orthopedic Surgery

## 2022-12-07 DIAGNOSIS — M19012 Primary osteoarthritis, left shoulder: Secondary | ICD-10-CM | POA: Diagnosis not present

## 2022-12-07 DIAGNOSIS — M25512 Pain in left shoulder: Secondary | ICD-10-CM | POA: Diagnosis not present

## 2022-12-22 ENCOUNTER — Telehealth: Payer: Self-pay | Admitting: *Deleted

## 2022-12-22 NOTE — Telephone Encounter (Signed)
   Pre-operative Risk Assessment    Patient Name: [name]  DOB: 1951/03/22 MRN: [id number]      Request for Surgical Clearance    Procedure:   LEFT REVERSE TOTAL SHOULDER ARTHROPLASTY  Date of Surgery:  Clearance TBD                                 Surgeon:  DR. Malon Kindle  Surgeon's Group or Practice Name:  [facility] Phone number:  [phone] ATTN: MEGAN DAVIS Fax number:  [phone]   Type of Clearance Requested:   - Medical ; ASA    Type of Anesthesia:  Not Indicated   Additional requests/questions:    Elpidio Anis   12/22/2022, 11:22 AM

## 2022-12-22 NOTE — Telephone Encounter (Signed)
   Name: [name]  DOB: December 30, 1950  MRN: [id number]  Primary Cardiologist: Nona Dell, MD   Preoperative team, please contact this patient and set up a phone call appointment for further preoperative risk assessment. Please obtain consent and complete medication review. Thank you for your help.  I confirm that guidance regarding antiplatelet and oral anticoagulation therapy has been completed and, if necessary, noted below.  His aspirin may be held for 5 to 7 days prior to his procedure.  Please resume as soon as hemostasis is achieved.   Ronney Asters, NP 12/22/2022, 11:37 AM [facility]

## 2022-12-22 NOTE — Telephone Encounter (Signed)
Pt has appt with Dr. Diona Browner. 01/22/23. Per pre op APP Edd Fabian, FNP pre op clearance to be addressed by Dr. Diona Browner at 01/22/23 appt. I will update all parties involved.

## 2023-01-22 ENCOUNTER — Ambulatory Visit: Payer: Medicare Other | Admitting: Cardiology

## 2023-01-22 NOTE — Progress Notes (Deleted)
Cardiology Office Note  Date: 01/22/2023   ID: [name], DOB 1950/11/06, MRN [id number]  History of Present Illness: [name] is a 72 y.o. male last seen in January.  Patient is being considered for left total shoulder arthroplasty by Dr. Devonne Doughty, requested to hold aspirin for procedure.  RCRI perioperative cardiac risk index is class II, 0.9% chance of major adverse cardiac event.  Physical Exam: VS:  There were no vitals taken for this visit., BMI There is no height or weight on file to calculate BMI.  Wt Readings from Last 3 Encounters:  07/19/22 260 lb 9.6 oz (118.2 kg)  01/16/22 271 lb (122.9 kg)  07/19/21 287 lb 3.2 oz (130.3 kg)    General: Patient appears comfortable at rest. HEENT: Conjunctiva and lids normal, oropharynx clear with moist mucosa. Neck: Supple, no elevated JVP or carotid bruits, no thyromegaly. Lungs: Clear to auscultation, nonlabored breathing at rest. Cardiac: Regular rate and rhythm, no S3 or significant systolic murmur, no pericardial rub. Abdomen: Soft, nontender, no hepatomegaly, bowel sounds present, no guarding or rebound. Extremities: No pitting edema, distal pulses 2+. Skin: Warm and dry. Musculoskeletal: No kyphosis. Neuropsychiatric: Alert and oriented x3, affect grossly appropriate.  ECG:  An ECG dated 01/16/2022 was personally reviewed today and demonstrated:  Sinus arrhythmia with prolonged PR interval, old anterior infarct pattern, nonspecific T wave changes.  Labwork:  March 2023: Hemoglobin 14, platelets 193, BUN 19, creatinine 0.78, potassium 4.4, AST 14, ALT 16, cholesterol 148, triglycerides 68, HDL 56, LDL 78 January 2024: Potassium 3.9, BUN 22, creatinine 0.96  Other Studies Reviewed Today:  Echocardiogram 09/20/2021:  1. Left ventricular ejection fraction, by estimation, is 55 to 60%. The  left ventricle has normal function. Left ventricular endocardial border  not optimally defined to evaluate regional wall  motion. There is mild left  ventricular hypertrophy. Left  ventricular diastolic parameters are consistent with Grade I diastolic  dysfunction (impaired relaxation).   2. Right ventricular systolic function was not well visualized. The right  ventricular size is not well visualized. Tricuspid regurgitation signal is  inadequate for assessing PA pressure.   3. The mitral valve is abnormal. Trivial mitral valve regurgitation. No  evidence of mitral stenosis.   4. The aortic valve is tricuspid. There is moderate calcification of the  aortic valve. There is moderate thickening of the aortic valve. Aortic  valve regurgitation is mild. Mild aortic valve stenosis. Aortic valve mean  gradient measures 10.8 mmHg.  Aortic valve peak gradient measures 17.7 mmHg. Aortic valve area, by VTI  measures 2.12 cm.   5. Aortic dilatation noted. There is mild dilatation of the aortic root,  measuring 41 mm. There is moderate dilatation of the ascending aorta,  measuring 44 mm.   6. The inferior vena cava is normal in size with greater than 50%  respiratory variability, suggesting right atrial pressure of 3 mmHg.   Assessment and Plan:  1.  Preoperative cardiac assessment.  2.  CAD status post multiple prior stent interventions to the RCA, LAD, and diagonal followed ultimately by CABG in 2015 including LIMA to LAD, SVG to diagonal, and SVG to OM1.  LVEF 55 to 60% by echocardiogram in March of last year.  3.  Essential hypertension.  4.  Mixed hyperlipidemia.  LDL 78 in March of last year.  5.  Mild degenerative, calcific aortic stenosis with mean AV gradient 11 mmHg by echocardiogram in March 2023.  6.  Ascending thoracic aortic dilatation, stable at  4.2 cm by chest CTA in May of this year.  Disposition:  Follow up {follow up:15908}  Signed, Jonelle Sidle, M.D., F.A.C.C. [facility] at [facility]

## 2023-01-23 ENCOUNTER — Encounter: Payer: Self-pay | Admitting: Cardiology

## 2023-01-23 ENCOUNTER — Ambulatory Visit: Payer: Medicare Other | Admitting: Cardiology

## 2023-01-23 ENCOUNTER — Ambulatory Visit: Payer: Medicare Other | Attending: Cardiology | Admitting: Cardiology

## 2023-01-23 VITALS — BP 118/78 | HR 63 | Ht 67.0 in | Wt 242.6 lb

## 2023-01-23 DIAGNOSIS — Z01810 Encounter for preprocedural cardiovascular examination: Secondary | ICD-10-CM | POA: Diagnosis not present

## 2023-01-23 DIAGNOSIS — I25119 Atherosclerotic heart disease of native coronary artery with unspecified angina pectoris: Secondary | ICD-10-CM | POA: Diagnosis not present

## 2023-01-23 DIAGNOSIS — E782 Mixed hyperlipidemia: Secondary | ICD-10-CM | POA: Diagnosis not present

## 2023-01-23 DIAGNOSIS — I350 Nonrheumatic aortic (valve) stenosis: Secondary | ICD-10-CM | POA: Diagnosis not present

## 2023-01-23 DIAGNOSIS — I712 Thoracic aortic aneurysm, without rupture, unspecified: Secondary | ICD-10-CM

## 2023-01-23 MED ORDER — WEGOVY 0.25 MG/0.5ML ~~LOC~~ SOAJ: SUBCUTANEOUS | 0 refills | Status: DC

## 2023-01-23 MED ORDER — WEGOVY 2.4 MG/0.75ML ~~LOC~~ SOAJ: 2.4000 mg | SUBCUTANEOUS | 3 refills | Status: DC

## 2023-01-23 MED ORDER — WEGOVY 1 MG/0.5ML ~~LOC~~ SOAJ: 1.0000 mg | SUBCUTANEOUS | 0 refills | Status: DC

## 2023-01-23 MED ORDER — WEGOVY 1.7 MG/0.75ML ~~LOC~~ SOAJ: 1.7000 mg | SUBCUTANEOUS | 0 refills | Status: DC

## 2023-01-23 NOTE — Progress Notes (Signed)
Cardiology Office Note  Date: 01/23/2023   ID: [name], DOB [birth year], MRN [id number]  History of Present Illness: [name] is a 72 y.o. male last seen in January.  He is here for a follow-up visit.  Reports no angina and stable NYHA class II dyspnea.  No palpitations or syncope.  Patient is being considered for left total shoulder arthroplasty by Dr. Ranell Patrick, requested to hold aspirin for procedure.  RCRI perioperative cardiac risk index is class II, 0.9% chance of major adverse cardiac event.  Echocardiogram from last year revealed normal LVEF and mild calcific aortic stenosis which is asymptomatic.  Chest CTA in May of this year showed stable ascending thoracic aortic dilatation at 4.2 cm.  ECG today shows sinus rhythm with prolonged PR interval and old septal infarct pattern.  I went over his medications which are stable from a cardiac perspective.  He has not had to use any nitroglycerin.  Physical Exam: VS:  BP 118/78   Pulse 63   Ht 5\' 7"  (1.702 m)   Wt 242 lb 9.6 oz (110 kg)   SpO2 98%   BMI 38.00 kg/m , BMI Body mass index is 38 kg/m.  Wt Readings from Last 3 Encounters:  01/23/23 242 lb 9.6 oz (110 kg)  07/19/22 260 lb 9.6 oz (118.2 kg)  01/16/22 271 lb (122.9 kg)    General: Patient appears comfortable at rest. HEENT: Conjunctiva and lids normal. Neck: Supple, no elevated JVP or carotid bruits. Lungs: Clear to auscultation, nonlabored breathing at rest. Cardiac: Regular rate and rhythm, no S3, 2/6 systolic murmur. Abdomen: Soft, bowel sounds present. Extremities: No pitting edema.  ECG:  An ECG dated 01/16/2022 was personally reviewed today and demonstrated:  Sinus arrhythmia with prolonged PR interval, old anterior infarct pattern, nonspecific T wave changes.  Labwork:  March 2023: Hemoglobin 14, platelets 193, BUN 19, creatinine 0.78, potassium 4.4, AST 14, ALT 16, cholesterol 148, triglycerides 68, HDL 56, LDL 78 January 2024: Potassium  3.9, BUN 22, creatinine 0.96  Other Studies Reviewed Today:  Echocardiogram 09/20/2021:  1. Left ventricular ejection fraction, by estimation, is 55 to 60%. The  left ventricle has normal function. Left ventricular endocardial border  not optimally defined to evaluate regional wall motion. There is mild left  ventricular hypertrophy. Left  ventricular diastolic parameters are consistent with Grade I diastolic  dysfunction (impaired relaxation).   2. Right ventricular systolic function was not well visualized. The right  ventricular size is not well visualized. Tricuspid regurgitation signal is  inadequate for assessing PA pressure.   3. The mitral valve is abnormal. Trivial mitral valve regurgitation. No  evidence of mitral stenosis.   4. The aortic valve is tricuspid. There is moderate calcification of the  aortic valve. There is moderate thickening of the aortic valve. Aortic  valve regurgitation is mild. Mild aortic valve stenosis. Aortic valve mean  gradient measures 10.8 mmHg.  Aortic valve peak gradient measures 17.7 mmHg. Aortic valve area, by VTI  measures 2.12 cm.   5. Aortic dilatation noted. There is mild dilatation of the aortic root,  measuring 41 mm. There is moderate dilatation of the ascending aorta,  measuring 44 mm.   6. The inferior vena cava is normal in size with greater than 50%  respiratory variability, suggesting right atrial pressure of 3 mmHg.   Assessment and Plan:  1.  Preoperative cardiac assessment.  Patient pending left total shoulder arthroplasty under general anesthesia, RCRI perioperative cardiac risk index  is class II, 0.9% chance of major adverse cardiac event.  He does not report any active angina at this time, ECG reviewed and stable.  May hold aspirin 7 days prior and if necessary could also hold omega-3 supplements and turmeric during same time course.  2.  CAD status post multiple prior stent interventions to the RCA, LAD, and diagonal followed  ultimately by CABG in 2015 including LIMA to LAD, SVG to diagonal, and SVG to OM1.  LVEF 55 to 60% by echocardiogram in March of last year.  He is symptomatically stable, no clear indication for follow-up ischemic testing at this point.  Continue aspirin, Lipitor, Lopressor, Cozaar, and as needed nitroglycerin.  3.  Essential hypertension.  Blood pressure well-controlled today.  No changes were made.  4.  Mixed hyperlipidemia.  LDL 78 in March of last year.  5.  Mild degenerative, calcific aortic stenosis with mean AV gradient 11 mmHg by echocardiogram in March 2023.  He is asymptomatic.  6.  Ascending thoracic aortic dilatation, stable at 4.2 cm by chest CTA in May of this year.  Disposition:  Follow up  6 months.  Signed, Jonelle Sidle, M.D., F.A.C.C. [facility] at [facility]

## 2023-01-23 NOTE — Patient Instructions (Addendum)
Medication Instructions:  Your physician has recommended you make the following change in your medication:  Stop mounjaro Start wegovy 0.25 mg weekly for 4 weeks, then increase to 0.5 mg weekly for 4 weeks, then increase to 1 mg weekly for 4 weeks, then increase to 1.7 mg weekly for 4 weeks, then increase to 2.4 mg weekly Continue other medications as prescribed  Labwork: none  Testing/Procedures: none  Follow-Up: Your physician recommends that you schedule a follow-up appointment in: 6 months  Any Other Special Instructions Will Be Listed Below (If Applicable).  If you need a refill on your cardiac medications before your next appointment, please call your pharmacy.

## 2023-01-26 ENCOUNTER — Telehealth: Payer: Self-pay

## 2023-01-26 ENCOUNTER — Other Ambulatory Visit (HOSPITAL_COMMUNITY): Payer: Self-pay

## 2023-01-26 NOTE — Telephone Encounter (Signed)
Pharmacy Patient Advocate Encounter   Received notification from Patient Pharmacy that prior authorization for [facility] is required/requested.   Insurance verification completed.   The patient is insured through [facility] .   Per test claim: PA required; PA submitted to [facility] via CoverMyMeds Key/confirmation #/EOC [id number]     Status is pending

## 2023-01-31 NOTE — Telephone Encounter (Signed)
Pharmacy Patient Advocate Encounter  Received notification from [facility] that Prior Authorization for [facility] has been  APPROVED THROUGH 12.31.24

## 2023-03-01 DIAGNOSIS — R030 Elevated blood-pressure reading, without diagnosis of hypertension: Secondary | ICD-10-CM | POA: Diagnosis not present

## 2023-03-01 DIAGNOSIS — I739 Peripheral vascular disease, unspecified: Secondary | ICD-10-CM | POA: Diagnosis not present

## 2023-03-01 DIAGNOSIS — Z01818 Encounter for other preprocedural examination: Secondary | ICD-10-CM | POA: Diagnosis not present

## 2023-03-01 DIAGNOSIS — R7303 Prediabetes: Secondary | ICD-10-CM | POA: Diagnosis not present

## 2023-03-01 DIAGNOSIS — I70213 Atherosclerosis of native arteries of extremities with intermittent claudication, bilateral legs: Secondary | ICD-10-CM | POA: Diagnosis not present

## 2023-03-01 DIAGNOSIS — G4733 Obstructive sleep apnea (adult) (pediatric): Secondary | ICD-10-CM | POA: Diagnosis not present

## 2023-03-01 DIAGNOSIS — M19012 Primary osteoarthritis, left shoulder: Secondary | ICD-10-CM | POA: Diagnosis not present

## 2023-03-01 DIAGNOSIS — E7849 Other hyperlipidemia: Secondary | ICD-10-CM | POA: Diagnosis not present

## 2023-03-06 DIAGNOSIS — R52 Pain, unspecified: Secondary | ICD-10-CM | POA: Diagnosis not present

## 2023-03-06 DIAGNOSIS — R0781 Pleurodynia: Secondary | ICD-10-CM | POA: Diagnosis not present

## 2023-03-06 DIAGNOSIS — R079 Chest pain, unspecified: Secondary | ICD-10-CM | POA: Diagnosis not present

## 2023-03-07 NOTE — H&P (Signed)
Patient's anticipated LOS is less than 2 midnights, meeting these requirements: - Younger than 43 - Lives within 1 hour of care - Has a competent adult at home to recover with post-op recover - NO history of  - Chronic pain requiring opiods  - Diabetes  - Coronary Artery Disease  - Heart failure  - Heart attack  - Stroke  - DVT/VTE  - Cardiac arrhythmia  - Respiratory Failure/COPD  - Renal failure  - Anemia  - Advanced Liver disease     [name] is an 72 y.o. male.    Chief Complaint: left shoulder pain  HPI: Pt is a 72 y.o. male complaining of left shoulder pain for multiple years. Pain had continually increased since the beginning. X-rays in the clinic show end-stage arthritic changes of the left shoulder. Pt has tried various conservative treatments which have failed to alleviate their symptoms, including injections and therapy. Various options are discussed with the patient. Risks, benefits and expectations were discussed with the patient. Patient understand the risks, benefits and expectations and wishes to proceed with surgery.   PCP:  Juliette Alcide, MD  D/C Plans: Home  PMH: Past Medical History:  Diagnosis Date   Aortic root dilatation (HCC)    Arthritis    Coronary atherosclerosis of native coronary artery    Multiple stents - RCA/LAD/diagonal, overlapping DES LAD, stent thrombosis 2005, ultimately CABG   Essential hypertension    H/O hiatal hernia    History of GI bleed    On nonsteroidals   Hyperlipemia    Myocardial infarction [facility])    Anterior with LAD stent thrombosis 2005, 2004 and 2005    Obesity    Paroxysmal atrial flutter (HCC)    Sleep apnea    CPAP daily    PSH: Past Surgical History:  Procedure Laterality Date   APPENDECTOMY     BREATH TEK H PYLORI  06/11/2012   Procedure: BREATH TEK H PYLORI;  Surgeon: Kandis Cocking, MD;  Location: [facility];  Service: General;  Laterality: Ronald Lobo   CARDIAC SURGERY     CARPAL  TUNNEL RELEASE Right 6/12   CARPAL TUNNEL RELEASE  06/06/2011   Procedure: CARPAL TUNNEL RELEASE;  Surgeon: Wyn Forster., MD;  Location: [facility];  Service: Orthopedics;  Laterality: Right;   CARPAL TUNNEL RELEASE Right 04/10/2013   Procedure: REVISION OF CARPAL TUNNEL RELEASE LIGAMENT RIGHT WRIST;  Surgeon: Wyn Forster., MD;  Location: [facility];  Service: Orthopedics;  Laterality: Right;   CERVICAL FUSION  9/10   CHOLECYSTECTOMY     CORONARY ANGIOPLASTY WITH STENT PLACEMENT  [id number]   CORONARY ARTERY BYPASS GRAFT N/A 12/30/2013   Procedure: CORONARY ARTERY BYPASS GRAFTING (CABG) x three, using left internal mammary artery and right leg greater sapheneous vein harvested endoscopically - LIMA to LAD, SVG-D, SVG-OM1;  Surgeon: Kerin Perna, MD;  Location: [facility] OR;  Service: Open Heart Surgery;  Laterality: N/A;   ELBOW ARTHROSCOPY Right    IRRIGATION AND DEBRIDEMENT SHOULDER Right 04/30/2020   Procedure: Right shoulder poly exchange and possible glensphere exchange and left shoulder injection;  Surgeon: Beverely Low, MD;  Location: [facility];  Service: Orthopedics;  Laterality: Right;   LAPAROSCOPIC GASTRIC BANDING  3/14   LEFT HEART CATHETERIZATION WITH CORONARY ANGIOGRAM N/A 12/26/2013   Procedure: LEFT HEART CATHETERIZATION WITH CORONARY ANGIOGRAM;  Surgeon: Marykay Lex, MD;  Location: [facility] CATH [facility];  Service: Cardiovascular;  Laterality: N/A;  REVERSE SHOULDER ARTHROPLASTY Right 01/30/2020   Procedure: REVERSE SHOULDER ARTHROPLASTY;  Surgeon: Beverely Low, MD;  Location: [facility];  Service: Orthopedics;  Laterality: Right;  interscalene block   SHOULDER ARTHROSCOPY Right 4/11   TOTAL KNEE ARTHROPLASTY Right 2006   TOTAL KNEE ARTHROPLASTY Left 2007   ULNAR NERVE TRANSPOSITION  06/06/2011   Procedure: ULNAR NERVE DECOMPRESSION/TRANSPOSITION;  Surgeon: Wyn Forster., MD;  Location: [facility];  Service: Orthopedics;  Laterality:  Right;  decompression ulnar nerve right cubital tunnel     Social History:  reports that he has never smoked. He quit smokeless tobacco use about 19 years ago.  His smokeless tobacco use included chew. He reports current alcohol use of about 1.0 standard drink of alcohol per week. He reports that he does not use drugs. BMI: Estimated body mass index is 38 kg/m as calculated from the following:   Height as of 01/23/23: 5\' 7"  (1.702 m).   Weight as of 01/23/23: 110 kg.  Lab Results  Component Value Date   ALBUMIN 4.1 08/29/2012   Diabetes: Patient does not have a diagnosis of diabetes.     Smoking Status:   reports that he has never smoked. He quit smokeless tobacco use about 19 years ago.  His smokeless tobacco use included chew.    Allergies:  Allergies  Allergen Reactions   Bee Venom    Other     Bee stings - lips and tongue swelling, throat tightens    Chlorhexidine Itching    Pt. States on last admission CHG wipes made him itch and it had to be washed off.  Uncertain if benadryl was given.    Medications: No current facility-administered medications for this encounter.   Current Outpatient Medications  Medication Sig Dispense Refill   aspirin 81 MG EC tablet Take 81 mg by mouth at bedtime.      atorvastatin (LIPITOR) 20 MG tablet TAKE 1 TABLET ONCE DAILY. 30 tablet 6   b complex vitamins tablet Take 1 tablet by mouth daily.     Cholecalciferol (VITAMIN D) 125 MCG (5000 UT) CAPS Take 5,000 Units by mouth daily.      Coenzyme Q10 (CO Q-10) 100 MG CAPS Take 100 mg by mouth daily.      cyclobenzaprine (FLEXERIL) 10 MG tablet Take 1 tablet (10 mg total) by mouth 3 (three) times daily as needed for muscle spasms. 30 tablet 0   EPINEPHrine 0.3 mg/0.3 mL IJ SOAJ injection Inject 0.3 mLs into the skin as needed for anaphylaxis.      losartan (COZAAR) 100 MG tablet Take 100 mg by mouth daily.     metoprolol (LOPRESSOR) 50 MG tablet Take 50 mg by mouth 2 (two) times daily.      Multiple Vitamins-Minerals (EYE VITAMINS PO) Take 1 capsule by mouth daily.     naproxen (NAPROSYN) 500 MG tablet Take 500 mg by mouth daily.     nitroGLYCERIN (NITROSTAT) 0.4 MG SL tablet Place 0.4 mg under the tongue every 5 (five) minutes as needed for chest pain. Up to 3x     Omega-3 Fatty Acids (OMEGA 3 500) 500 MG CAPS Take 500 mg by mouth daily.      Pomegranate, Punica granatum, (POMEGRANATE PO) Take 2 capsules by mouth daily.      Semaglutide-Weight Management (WEGOVY) 0.25 MG/0.5ML SOAJ Inject 0.25 mg into the skin once a week for 28 days, THEN 0.5 mg once a week for 28 days. then increase to 1 mg  weekly for 4 weeks. 2 mL 0   [START ON 03/19/2023] Semaglutide-Weight Management (WEGOVY) 1 MG/0.5ML SOAJ Inject 1 mg into the skin once a week for 28 days. Then increase to 1.7 mg weekly for 4 weeks, then increase to 2.4 mg week 2 mL 0   [START ON 05/14/2023] Semaglutide-Weight Management (WEGOVY) 1.7 MG/0.75ML SOAJ Inject 1.7 mg into the skin once a week. 3 mL 0   [START ON 05/15/2023] Semaglutide-Weight Management (WEGOVY) 2.4 MG/0.75ML SOAJ Inject 2.4 mg into the skin once a week. 3 mL 3   Tadalafil 2.5 MG TABS Take 2.5 mg by mouth every morning.     Turmeric 500 MG TABS Take 500 mg by mouth 2 (two) times daily.       No results found for this or any previous visit (from the past 48 hour(s)). No results found.  ROS: Pain with rom of the left upper extremity  Physical Exam: Alert and oriented 72 y.o. male in no acute distress Cranial nerves 2-12 intact Cervical spine: full rom with no tenderness, nv intact distally Chest: active breath sounds bilaterally, no wheeze rhonchi or rales Heart: regular rate and rhythm, no murmur Abd: non tender non distended with active bowel sounds Hip is stable with rom  Left shoulder painful and weak rom Nv intact distally No rashes or edema  Assessment/Plan Assessment: left shoulder cuff arthropathy  Plan:  Patient will undergo a reverse total  shoulder by Dr. Ranell Patrick at [place] Risks benefits and expectations were discussed with the patient. Patient understand risks, benefits and expectations and wishes to proceed. Preoperative templating of the joint replacement has been completed, documented, and submitted to the Operating Room personnel in order to optimize intra-operative equipment management.   Alphonsa Overall PA-C, MPAS [facility] is now [employer] [address], [place], [place] [address] Phone: [phone] [email or web address] Facebook  [employer]

## 2023-03-14 DIAGNOSIS — E871 Hypo-osmolality and hyponatremia: Secondary | ICD-10-CM | POA: Diagnosis not present

## 2023-03-14 DIAGNOSIS — R5382 Chronic fatigue, unspecified: Secondary | ICD-10-CM | POA: Diagnosis not present

## 2023-03-14 DIAGNOSIS — I10 Essential (primary) hypertension: Secondary | ICD-10-CM | POA: Diagnosis not present

## 2023-03-21 NOTE — Progress Notes (Addendum)
Anesthesia Review:  PCP: DR Quintin Alto clearance 10/20/2022 on chart    LOV 03/06/23 for fall at home.   Cardiologist : DR Simona Huh- LOV 01/23/23  on chart  Chest x-ray : EKG : 01/23/23  Echo : 09/20/21  Stress test: 2021  Cardiac Cath :  Activity level: can do a flight of stairs without difficutly  Sleep Study/ CPAP : hjas cpap  Fasting Blood Sugar :      / Checks Blood Sugar -- times a day:   Blood Thinner/ Instructions /Last Dose: ASA / Instructions/ Last Dose :    81 mg aspirin    Mounjaro- - taken for weight loss ,  Last dose on 03/21/2023    PT is allergic to hibiclens.  PT to use Dial antibacterial soap instead of hibiclens.

## 2023-03-21 NOTE — Patient Instructions (Signed)
SURGICAL WAITING ROOM VISITATION  Patients having surgery or a procedure may have no more than 2 support people in the waiting area - these visitors may rotate.    Children under the age of 27 must have an adult with them who is not the patient.  Due to an increase in RSV and influenza rates and associated hospitalizations, children ages 95 and under may not visit patients in [facility] hospitals.  If the patient needs to stay at the hospital during part of their recovery, the visitor guidelines for inpatient rooms apply. Pre-op nurse will coordinate an appropriate time for 1 support person to accompany patient in pre-op.  This support person may not rotate.    Please refer to the [facility] website for the visitor guidelines for Inpatients (after your surgery is over and you are in a regular room).       Your procedure is scheduled on:  03/30/23    Report to [facility] Main Entrance    Report to admitting at  0515  Call this number if you have problems the morning of surgery [phone]   Do not eat food :After Midnight.   After Midnight you may have the following liquids until ___ 0430___ Am  DAY OF SURGERY  Water Non-Citrus Juices (without pulp, NO RED-Apple, White grape, White cranberry) Black Coffee (NO MILK/CREAM OR CREAMERS, sugar ok)  Clear Tea (NO MILK/CREAM OR CREAMERS, sugar ok) regular and decaf                             Plain Jell-O (NO RED)                                           Fruit ices (not with fruit pulp, NO RED)                                     Popsicles (NO RED)                                                               Sports drinks like Gatorade (NO RED)                    The day of surgery:  Drink ONE (1) Pre-Surgery Clear Ensure or G2 at 0430 AM  ( have completed by ) the morning of surgery. Drink in one sitting. Do not sip.  This drink was given to you during your hospital  pre-op appointment visit. Nothing else to drink  after completing the  Pre-Surgery Clear Ensure or G2.          If you have questions, please contact your surgeon's office.        Oral Hygiene is also important to reduce your risk of infection.                                    Remember - BRUSH YOUR TEETH THE MORNING OF SURGERY WITH YOUR  REGULAR TOOTHPASTE  DENTURES WILL BE REMOVED PRIOR TO SURGERY PLEASE DO NOT APPLY "Poly grip" OR ADHESIVES!!!   Do NOT smoke after Midnight   Stop all vitamins and herbal supplements 7 days before surgery.   Take these medicines the morning of surgery with A SIP OF WATER:  claritin if needed, metoprolol    Mounjaro-   DO NOT TAKE ANY ORAL DIABETIC MEDICATIONS DAY OF YOUR SURGERY  Bring CPAP mask and tubing day of surgery.                              You may not have any metal on your body including hair pins, jewelry, and body piercing             Do not wear make-up, lotions, powders, perfumes/cologne, or deodorant  Do not wear nail polish including gel and S&S, artificial/acrylic nails, or any other type of covering on natural nails including finger and toenails. If you have artificial nails, gel coating, etc. that needs to be removed by a nail salon please have this removed prior to surgery or surgery may need to be canceled/ delayed if the surgeon/ anesthesia feels like they are unable to be safely monitored.   Do not shave  48 hours prior to surgery.               Men may shave face and neck.   Do not bring valuables to the hospital. [facility] IS NOT             RESPONSIBLE   FOR VALUABLES.   Contacts, glasses, dentures or bridgework may not be worn into surgery.   Bring small overnight bag day of surgery.   DO NOT BRING YOUR HOME MEDICATIONS TO THE HOSPITAL. PHARMACY WILL DISPENSE MEDICATIONS LISTED ON YOUR MEDICATION LIST TO YOU DURING YOUR ADMISSION IN THE HOSPITAL!    Patients discharged on the day of surgery will not be allowed to drive home.  Someone NEEDS to stay with  you for the first 24 hours after anesthesia.   Special Instructions: Bring a copy of your healthcare power of attorney and living will documents the day of surgery if you haven't scanned them before.              Please read over the following fact sheets you were given: IF YOU HAVE QUESTIONS ABOUT YOUR PRE-OP INSTRUCTIONS PLEASE CALL [phone]   If you received a COVID test during your pre-op visit  it is requested that you wear a mask when out in public, stay away from anyone that may not be feeling well and notify your surgeon if you develop symptoms. If you test positive for Covid or have been in contact with anyone that has tested positive in the last 10 days please notify you surgeon.      Pre-operative 5 CHG Bath Instructions   You can play a key role in reducing the risk of infection after surgery. Your skin needs to be as free of germs as possible. You can reduce the number of germs on your skin by washing with CHG (chlorhexidine gluconate) soap before surgery. CHG is an antiseptic soap that kills germs and continues to kill germs even after washing.   DO NOT use if you have an allergy to chlorhexidine/CHG or antibacterial soaps. If your skin becomes reddened or irritated, stop using the CHG and notify one of our RNs at [phone].   Please  shower with the CHG soap starting 4 days before surgery using the following schedule:     Please keep in mind the following:  DO NOT shave, including legs and underarms, starting the day of your first shower.   You may shave your face at any point before/day of surgery.  Place clean sheets on your bed the day you start using CHG soap. Use a clean washcloth (not used since being washed) for each shower. DO NOT sleep with pets once you start using the CHG.   CHG Shower Instructions:  If you choose to wash your hair and private area, wash first with your normal shampoo/soap.  After you use shampoo/soap, rinse your hair and body thoroughly  to remove shampoo/soap residue.  Turn the water OFF and apply about 3 tablespoons (45 ml) of CHG soap to a CLEAN washcloth.  Apply CHG soap ONLY FROM YOUR NECK DOWN TO YOUR TOES (washing for 3-5 minutes)  DO NOT use CHG soap on face, private areas, open wounds, or sores.  Pay special attention to the area where your surgery is being performed.  If you are having back surgery, having someone wash your back for you may be helpful. Wait 2 minutes after CHG soap is applied, then you may rinse off the CHG soap.  Pat dry with a clean towel  Put on clean clothes/pajamas   If you choose to wear lotion, please use ONLY the CHG-compatible lotions on the back of this paper.     Additional instructions for the day of surgery: DO NOT APPLY any lotions, deodorants, cologne, or perfumes.   Put on clean/comfortable clothes.  Brush your teeth.  Ask your nurse before applying any prescription medications to the skin.      CHG Compatible Lotions   Aveeno Moisturizing lotion  Cetaphil Moisturizing Cream  Cetaphil Moisturizing Lotion  Clairol Herbal Essence Moisturizing Lotion, Dry Skin  Clairol Herbal Essence Moisturizing Lotion, Extra Dry Skin  Clairol Herbal Essence Moisturizing Lotion, Normal Skin  Curel Age Defying Therapeutic Moisturizing Lotion with Alpha Hydroxy  Curel Extreme Care Body Lotion  Curel Soothing Hands Moisturizing Hand Lotion  Curel Therapeutic Moisturizing Cream, Fragrance-Free  Curel Therapeutic Moisturizing Lotion, Fragrance-Free  Curel Therapeutic Moisturizing Lotion, Original Formula  Eucerin Daily Replenishing Lotion  Eucerin Dry Skin Therapy Plus Alpha Hydroxy Crme  Eucerin Dry Skin Therapy Plus Alpha Hydroxy Lotion  Eucerin Original Crme  Eucerin Original Lotion  Eucerin Plus Crme Eucerin Plus Lotion  Eucerin TriLipid Replenishing Lotion  Keri Anti-Bacterial Hand Lotion  Keri Deep Conditioning Original Lotion Dry Skin Formula Softly Scented  Keri Deep  Conditioning Original Lotion, Fragrance Free Sensitive Skin Formula  Keri Lotion Fast Absorbing Fragrance Free Sensitive Skin Formula  Keri Lotion Fast Absorbing Softly Scented Dry Skin Formula  Keri Original Lotion  Keri Skin Renewal Lotion Keri Silky Smooth Lotion  Keri Silky Smooth Sensitive Skin Lotion  Nivea Body Creamy Conditioning Oil  Nivea Body Extra Enriched Lotion  Nivea Body Original Lotion  Nivea Body Sheer Moisturizing Lotion Nivea Crme  Nivea Skin Firming Lotion  NutraDerm 30 Skin Lotion  NutraDerm Skin Lotion  NutraDerm Therapeutic Skin Cream  NutraDerm Therapeutic Skin Lotion  ProShield Protective Hand Cream  Provon moisturizing lotion   Ethete- Preparing for Total Shoulder Arthroplasty    Before surgery, you can play an important role. Because skin is not sterile, your skin needs to be as free of germs as possible. You can reduce the number of germs on your skin by  using the following products. Benzoyl Peroxide Gel Reduces the number of germs present on the skin Applied twice a day to shoulder area starting two days before surgery    ==================================================================  Please follow these instructions carefully:  BENZOYL PEROXIDE 5% GEL  Please do not use if you have an allergy to benzoyl peroxide.   If your skin becomes reddened/irritated stop using the benzoyl peroxide.  Starting two days before surgery, apply as follows: Apply benzoyl peroxide in the morning and at night. Apply after taking a shower. If you are not taking a shower clean entire shoulder front, back, and side along with the armpit with a clean wet washcloth.  Place a quarter-sized dollop on your shoulder and rub in thoroughly, making sure to cover the front, back, and side of your shoulder, along with the armpit.   2 days before ____ AM   ____ PM              1 day before ____ AM   ____ PM                         Do this twice a day for two days.  (Last  application is the night before surgery, AFTER using the CHG soap as described below).  Do NOT apply benzoyl peroxide gel on the day of surgery.

## 2023-03-26 ENCOUNTER — Other Ambulatory Visit: Payer: Self-pay

## 2023-03-26 ENCOUNTER — Encounter (HOSPITAL_COMMUNITY)
Admission: RE | Admit: 2023-03-26 | Discharge: 2023-03-26 | Disposition: A | Discharge status: Home / Self Care | Payer: Medicare Other | Source: Ambulatory Visit | Attending: Orthopedic Surgery | Admitting: Orthopedic Surgery

## 2023-03-26 ENCOUNTER — Encounter (HOSPITAL_COMMUNITY): Payer: Self-pay

## 2023-03-26 VITALS — BP 127/82 | HR 74 | Temp 98.4°F | Resp 16 | Ht 67.0 in | Wt 229.0 lb

## 2023-03-26 DIAGNOSIS — Z01818 Encounter for other preprocedural examination: Secondary | ICD-10-CM

## 2023-03-26 DIAGNOSIS — I4892 Unspecified atrial flutter: Secondary | ICD-10-CM | POA: Diagnosis not present

## 2023-03-26 DIAGNOSIS — I252 Old myocardial infarction: Secondary | ICD-10-CM | POA: Diagnosis not present

## 2023-03-26 DIAGNOSIS — I10 Essential (primary) hypertension: Secondary | ICD-10-CM | POA: Insufficient documentation

## 2023-03-26 DIAGNOSIS — Z79899 Other long term (current) drug therapy: Secondary | ICD-10-CM | POA: Diagnosis not present

## 2023-03-26 DIAGNOSIS — Z01812 Encounter for preprocedural laboratory examination: Secondary | ICD-10-CM | POA: Insufficient documentation

## 2023-03-26 DIAGNOSIS — Z9889 Other specified postprocedural states: Secondary | ICD-10-CM | POA: Diagnosis not present

## 2023-03-26 DIAGNOSIS — G4733 Obstructive sleep apnea (adult) (pediatric): Secondary | ICD-10-CM | POA: Insufficient documentation

## 2023-03-26 DIAGNOSIS — I2510 Atherosclerotic heart disease of native coronary artery without angina pectoris: Secondary | ICD-10-CM | POA: Insufficient documentation

## 2023-03-26 LAB — BASIC METABOLIC PANEL
Anion gap: 12 (ref 5–15)
BUN: 19 mg/dL (ref 8–23)
CO2: 23 mmol/L (ref 22–32)
Calcium: 9.3 mg/dL (ref 8.9–10.3)
Chloride: 102 mmol/L (ref 98–111)
Creatinine, Ser: 0.58 mg/dL — ABNORMAL LOW (ref 0.61–1.24)
GFR, Estimated: >60 mL/min (ref >60)
Glucose, Bld: 102 mg/dL — ABNORMAL HIGH (ref 70–99)
Potassium: 3.7 mmol/L (ref 3.5–5.1)
Sodium: 137 mmol/L (ref 135–145)

## 2023-03-26 LAB — CBC
HCT: 42.8 % (ref 39.0–52.0)
Hemoglobin: 14.1 g/dL (ref 13.0–17.0)
MCH: 30.1 pg (ref 26.0–34.0)
MCHC: 32.9 g/dL (ref 30.0–36.0)
MCV: 91.3 fL (ref 80.0–100.0)
Platelets: 210 10*3/uL (ref 150–400)
RBC: 4.69 MIL/uL (ref 4.22–5.81)
RDW: 13.4 % (ref 11.5–15.5)
WBC: 10 10*3/uL (ref 4.0–10.5)
nRBC: 0 % (ref 0.0–0.2)

## 2023-03-26 LAB — SURGICAL PCR SCREEN
MRSA, PCR: NEGATIVE
Staphylococcus aureus: NEGATIVE

## 2023-03-27 ENCOUNTER — Encounter (HOSPITAL_COMMUNITY): Payer: Self-pay

## 2023-03-27 NOTE — Anesthesia Preprocedure Evaluation (Addendum)
Anesthesia Evaluation  Patient identified by MRN, date of birth, ID band Patient awake    Reviewed: Allergy & Precautions, NPO status , Patient's Chart, lab work & pertinent test results  Airway Mallampati: III  TM Distance: >3 FB Neck ROM: Full    Dental  (+) Chipped,    Pulmonary sleep apnea and Continuous Positive Airway Pressure Ventilation    Pulmonary exam normal        Cardiovascular hypertension, Pt. on medications and Pt. on home beta blockers + CAD, + Past MI, + Cardiac Stents and + CABG  Normal cardiovascular exam+ dysrhythmias Atrial Fibrillation   ECHO: 1. Left ventricular ejection fraction, by estimation, is 55 to 60%. The  left ventricle has normal function. Left ventricular endocardial border  not optimally defined to evaluate regional wall motion. There is mild left  ventricular hypertrophy. Left  ventricular diastolic parameters are consistent with Grade I diastolic  dysfunction (impaired relaxation).   2. Right ventricular systolic function was not well visualized. The right  ventricular size is not well visualized. Tricuspid regurgitation signal is  inadequate for assessing PA pressure.   3. The mitral valve is abnormal. Trivial mitral valve regurgitation. No  evidence of mitral stenosis.   4. The aortic valve is tricuspid. There is moderate calcification of the  aortic valve. There is moderate thickening of the aortic valve. Aortic  valve regurgitation is mild. Mild aortic valve stenosis. Aortic valve mean  gradient measures 10.8 mmHg.  Aortic valve peak gradient measures 17.7 mmHg. Aortic valve area, by VTI  measures 2.12 cm.   5. Aortic dilatation noted. There is mild dilatation of the aortic root,  measuring 41 mm. There is moderate dilatation of the ascending aorta,  measuring 44 mm.   6. The inferior vena cava is normal in size with greater than 50%  respiratory variability, suggesting right atrial  pressure of 3 mmHg.     Neuro/Psych negative neurological ROS  negative psych ROS   GI/Hepatic Neg liver ROS, hiatal hernia,,,  Endo/Other  Patient on GLP-1 Agonist  Renal/GU negative Renal ROS     Musculoskeletal  (+) Arthritis ,    Abdominal  (+) + obese  Peds  Hematology negative hematology ROS (+)   Anesthesia Other Findings Left shoulder osteoarthritis  Reproductive/Obstetrics                             Anesthesia Physical Anesthesia Plan  ASA: 3  Anesthesia Plan: General and Regional   Post-op Pain Management: Regional block*   Induction: Intravenous  PONV Risk Score and Plan: 2 and Ondansetron, Dexamethasone, Treatment may vary due to age or medical condition and Midazolam  Airway Management Planned: Oral ETT  Additional Equipment:   Intra-op Plan:   Post-operative Plan: Extubation in OR  Informed Consent: I have reviewed the patients History and Physical, chart, labs and discussed the procedure including the risks, benefits and alternatives for the proposed anesthesia with the patient or authorized representative who has indicated his/her understanding and acceptance.     Dental advisory given  Plan Discussed with: CRNA  Anesthesia Plan Comments: (PAT note from 9/23 by Sherlie Ban PA-C )        Anesthesia Quick Evaluation

## 2023-03-27 NOTE — Progress Notes (Signed)
Anesthesia Review:   DISCUSSION: [name] is a 72 yo male who presents to PAT prior to left shoulder surgery on 03/30/23 with Dr. Ranell Patrick. PMH of HTN, CAD s/p CABG (12/2013), hx of multiple MIs, mild AS, aortic root dilatation, TAA, a.flutter (not anticoagulated), OSA (on CPAP), hiatal hernia s/p repair (08/2012), obesity, arthritis s/p right shoulder surgery (01/2020, 04/2020)  Prior anesthesia complications include: Patient reports failed block with last procedure (right shoulder surgery on 01/30/2020) and phrenic nerve palsy and difficulty breathing for weeks after procedure   Patient follows with Cardiology for hx of CAD s/p CABG, paroxysmal atrial flutter, mild AS, TAA and aortic root dilatation. Last saw Dr. Diona Browner on 01/23/23. Has been stable from cardiac standpoint. BP controlled. No cardiac symptoms. Mild AS is being monitored and aortic root dilatation has been stable by most recent CT on 11/02/22 (in [facility]). He is not anticoagulated for A.flutter due to this being a transient problem ~2015.  "Preoperative cardiac assessment.  Patient pending left total shoulder arthroplasty under general anesthesia, RCRI perioperative cardiac risk index is class II, 0.9% chance of major adverse cardiac event.  He does not report any active angina at this time, ECG reviewed and stable.  May hold aspirin 7 days prior and if necessary could also hold omega-3 supplements and turmeric during same time course."  VS: BP 127/82   Pulse 74   Temp 36.9 C (Oral)   Resp 16   Ht 5\' 7"  (1.702 m)   Wt 103.9 kg   SpO2 97%   BMI 35.87 kg/m   PROVIDERS: [name], Ananias Pilgrim, MD Cardiology: Nona Dell, MD  LABS: Labs reviewed: Acceptable for surgery. (all labs ordered are listed, but only abnormal results are displayed)  Labs Reviewed  BASIC METABOLIC PANEL - Abnormal; Notable for the following components:      Result Value   Glucose, Bld 102 (*)    Creatinine, Ser 0.58 (*)    All other components within  normal limits  SURGICAL PCR SCREEN  CBC     IMAGES:  CT chest 11/02/22 [facility]) Impression  1. Irregular linear nodular subpleural opacity of the right lower lobe is unchanged when compared with recent prior exam, but increased when compared with July 21, 2020 prior exam, possibly scarring. Additional follow-up chest CT is recommended in 6 months to ensure stability. 2. Bilateral calcified pleural plaques, findings can be seen in the setting of asbestos exposure. 3. Moderate aortic valve calcifications, findings can be seen in the setting of aortic valve stenosis or sclerosis. Correlate with echocardiography. 4. Stable mildly dilated ascending thoracic aorta measuring up to 4.2 cm. Recommend annual imaging followup by CTA or MRA. This recommendation follows 2010 ACCF/AHA/AATS/ACR/ASA/SCA/SCAI/SIR/STS/SVM Guidelines for the Diagnosis and Management of Patients with Thoracic Aortic Disease. Circulation. 2010; 121: W098-J191. Aortic aneurysm NOS (ICD10-I71.9) 5. Aortic Atherosclerosis (ICD10-I70.0).   EKG 01/23/23:  Sinus rhythm with sinus arrhythmia with 1st degree A-V block, rate 63 Septal infarct   CV:  Echo 09/20/2021:  IMPRESSIONS     1. Left ventricular ejection fraction, by estimation, is 55 to 60%. The  left ventricle has normal function. Left ventricular endocardial border  not optimally defined to evaluate regional wall motion. There is mild left  ventricular hypertrophy. Left  ventricular diastolic parameters are consistent with Grade I diastolic  dysfunction (impaired relaxation).   2. Right ventricular systolic function was not well visualized. The right  ventricular size is not well visualized. Tricuspid regurgitation signal is  inadequate for assessing  PA pressure.   3. The mitral valve is abnormal. Trivial mitral valve regurgitation. No  evidence of mitral stenosis.   4. The aortic valve is tricuspid. There is moderate calcification of the  aortic  valve. There is moderate thickening of the aortic valve. Aortic  valve regurgitation is mild. Mild aortic valve stenosis. Aortic valve mean  gradient measures 10.8 mmHg.  Aortic valve peak gradient measures 17.7 mmHg. Aortic valve area, by VTI  measures 2.12 cm.   5. Aortic dilatation noted. There is mild dilatation of the aortic root,  measuring 41 mm. There is moderate dilatation of the ascending aorta,  measuring 44 mm.   6. The inferior vena cava is normal in size with greater than 50%  respiratory variability, suggesting right atrial pressure of 3 mmHg.   Comparison(s): LVEF 55-60%, Aortic Root 4.1 cm.   Stress test 10/16/2019:  Narrative & Impression  No diagnostic ST segment changes to indicate ischemia. Small size, moderate intensity, partially reversible defect in the mid to apical inferior and predominantly mid inferolateral wall. This is consistent with scar and a mild region of peri-infarct ischemia. This is a low risk study. Nuclear stress EF: 57%.    Past Medical History:  Diagnosis Date   Aortic root dilatation (HCC)    Arthritis    Coronary atherosclerosis of native coronary artery    Multiple stents - RCA/LAD/diagonal, overlapping DES LAD, stent thrombosis 2005, ultimately CABG   Essential hypertension    H/O hiatal hernia    History of GI bleed    On nonsteroidals   Hyperlipemia    Myocardial infarction [facility])    Anterior with LAD stent thrombosis 2005, 2004 and 2005    Obesity    Paroxysmal atrial flutter (HCC)    Sleep apnea    CPAP daily    Past Surgical History:  Procedure Laterality Date   APPENDECTOMY     BREATH TEK H PYLORI  06/11/2012   Procedure: BREATH TEK H PYLORI;  Surgeon: Kandis Cocking, MD;  Location: [facility];  Service: General;  Laterality: Ronald Lobo   CARDIAC SURGERY     CARPAL TUNNEL RELEASE Right 12/02/2010   CARPAL TUNNEL RELEASE  06/06/2011   Procedure: CARPAL TUNNEL RELEASE;  Surgeon: Wyn Forster., MD;  Location:  [facility];  Service: Orthopedics;  Laterality: Right;   CARPAL TUNNEL RELEASE Right 04/10/2013   Procedure: REVISION OF CARPAL TUNNEL RELEASE LIGAMENT RIGHT WRIST;  Surgeon: Wyn Forster., MD;  Location: [facility];  Service: Orthopedics;  Laterality: Right;   CERVICAL FUSION  03/03/2009   CHOLECYSTECTOMY     CORONARY ANGIOPLASTY WITH STENT PLACEMENT  [id number]   CORONARY ARTERY BYPASS GRAFT N/A 12/30/2013   Procedure: CORONARY ARTERY BYPASS GRAFTING (CABG) x three, using left internal mammary artery and right leg greater sapheneous vein harvested endoscopically - LIMA to LAD, SVG-D, SVG-OM1;  Surgeon: Kerin Perna, MD;  Location: [facility] OR;  Service: Open Heart Surgery;  Laterality: N/A;   ELBOW ARTHROSCOPY Right    IRRIGATION AND DEBRIDEMENT SHOULDER Right 04/30/2020   Procedure: Right shoulder poly exchange and possible glensphere exchange and left shoulder injection;  Surgeon: Beverely Low, MD;  Location: [facility];  Service: Orthopedics;  Laterality: Right;   LAPAROSCOPIC GASTRIC BANDING  08/31/2012   left carpal tunnel release      LEFT HEART CATHETERIZATION WITH CORONARY ANGIOGRAM N/A 12/26/2013   Procedure: LEFT HEART CATHETERIZATION WITH CORONARY ANGIOGRAM;  Surgeon: Marykay Lex,  MD;  Location: [facility];  Service: Cardiovascular;  Laterality: N/A;   REVERSE SHOULDER ARTHROPLASTY Right 01/30/2020   Procedure: REVERSE SHOULDER ARTHROPLASTY;  Surgeon: Beverely Low, MD;  Location: [facility];  Service: Orthopedics;  Laterality: Right;  interscalene block   SHOULDER ARTHROSCOPY Right 10/01/2009   TOTAL KNEE ARTHROPLASTY Right 07/03/2004   TOTAL KNEE ARTHROPLASTY Left 07/03/2005   ULNAR NERVE TRANSPOSITION  06/06/2011   Procedure: ULNAR NERVE DECOMPRESSION/TRANSPOSITION;  Surgeon: Wyn Forster., MD;  Location:  SURGERY CENTER;  Service: Orthopedics;  Laterality: Right;  decompression ulnar nerve right cubital tunnel     MEDICATIONS:   aspirin 81 MG EC tablet   atorvastatin (LIPITOR) 20 MG tablet   b complex vitamins tablet   Cholecalciferol (VITAMIN D) 125 MCG (5000 UT) CAPS   Coenzyme Q10 (CO Q-10) 100 MG CAPS   cyclobenzaprine (FLEXERIL) 10 MG tablet   EPINEPHrine 0.3 mg/0.3 mL IJ SOAJ injection   Krill Oil 1000 MG CAPS   loratadine (CLARITIN) 10 MG tablet   losartan (COZAAR) 100 MG tablet   LYCOPENE PO   metoprolol (LOPRESSOR) 50 MG tablet   MOUNJARO 15 MG/0.5ML Pen   Multiple Vitamins-Minerals (EYE VITAMINS PO)   naproxen (NAPROSYN) 500 MG tablet   nitroGLYCERIN (NITROSTAT) 0.4 MG SL tablet   OLIVE LEAF EXTRACT PO   Pomegranate, [place] granatum, (POMEGRANATE PO)   Tadalafil 2.5 MG TABS   Turmeric 500 MG TABS   No current facility-administered medications for this encounter.   Marcille Blanco [facility] Short Stay/Anesthesiology [facility] Phone [phone] 03/27/2023 11:21 AM

## 2023-03-30 ENCOUNTER — Ambulatory Visit (HOSPITAL_COMMUNITY): Payer: Medicare Other

## 2023-03-30 ENCOUNTER — Encounter (HOSPITAL_COMMUNITY): Payer: Self-pay | Admitting: Orthopedic Surgery

## 2023-03-30 ENCOUNTER — Other Ambulatory Visit: Payer: Self-pay

## 2023-03-30 ENCOUNTER — Ambulatory Visit (HOSPITAL_COMMUNITY): Payer: Medicare Other | Admitting: Medical

## 2023-03-30 ENCOUNTER — Ambulatory Visit (HOSPITAL_COMMUNITY)
Admission: RE | Admit: 2023-03-30 | Discharge: 2023-03-30 | Disposition: A | Discharge status: Home / Self Care | Payer: Medicare Other | Source: Ambulatory Visit | Attending: Orthopedic Surgery | Admitting: Orthopedic Surgery

## 2023-03-30 ENCOUNTER — Encounter (HOSPITAL_COMMUNITY)
Admission: RE | Disposition: A | Discharge status: Home / Self Care | Payer: Self-pay | Source: Ambulatory Visit | Attending: Orthopedic Surgery

## 2023-03-30 DIAGNOSIS — I2510 Atherosclerotic heart disease of native coronary artery without angina pectoris: Secondary | ICD-10-CM | POA: Insufficient documentation

## 2023-03-30 DIAGNOSIS — Z7985 Long-term (current) use of injectable non-insulin antidiabetic drugs: Secondary | ICD-10-CM | POA: Diagnosis not present

## 2023-03-30 DIAGNOSIS — I4891 Unspecified atrial fibrillation: Secondary | ICD-10-CM

## 2023-03-30 DIAGNOSIS — I10 Essential (primary) hypertension: Secondary | ICD-10-CM | POA: Insufficient documentation

## 2023-03-30 DIAGNOSIS — M19012 Primary osteoarthritis, left shoulder: Secondary | ICD-10-CM

## 2023-03-30 DIAGNOSIS — Z87891 Personal history of nicotine dependence: Secondary | ICD-10-CM | POA: Diagnosis not present

## 2023-03-30 DIAGNOSIS — G8918 Other acute postprocedural pain: Secondary | ICD-10-CM | POA: Diagnosis not present

## 2023-03-30 DIAGNOSIS — Z951 Presence of aortocoronary bypass graft: Secondary | ICD-10-CM | POA: Insufficient documentation

## 2023-03-30 DIAGNOSIS — Z96612 Presence of left artificial shoulder joint: Secondary | ICD-10-CM | POA: Diagnosis not present

## 2023-03-30 DIAGNOSIS — I252 Old myocardial infarction: Secondary | ICD-10-CM | POA: Diagnosis not present

## 2023-03-30 DIAGNOSIS — G4730 Sleep apnea, unspecified: Secondary | ICD-10-CM | POA: Insufficient documentation

## 2023-03-30 DIAGNOSIS — Z01818 Encounter for other preprocedural examination: Secondary | ICD-10-CM

## 2023-03-30 HISTORY — PX: REVERSE SHOULDER ARTHROPLASTY: SHX5054

## 2023-03-30 SURGERY — ARTHROPLASTY, SHOULDER, TOTAL, REVERSE
Anesthesia: Regional | Site: Shoulder | Laterality: Left

## 2023-03-30 MED ORDER — LIDOCAINE HCL (CARDIAC) PF 100 MG/5ML IV SOSY
PREFILLED_SYRINGE | INTRAVENOUS | Status: DC | PRN
  Administered 2023-03-30: 40 mg via INTRAVENOUS

## 2023-03-30 MED ORDER — ONDANSETRON HCL 4 MG/2ML IJ SOLN
INTRAMUSCULAR | Status: AC
  Filled 2023-03-30: qty 2

## 2023-03-30 MED ORDER — PHENYLEPHRINE HCL-NACL 20-0.9 MG/250ML-% IV SOLN
INTRAVENOUS | Status: AC
  Filled 2023-03-30: qty 250

## 2023-03-30 MED ORDER — MIDAZOLAM HCL 2 MG/2ML IJ SOLN
INTRAMUSCULAR | Status: AC
  Filled 2023-03-30: qty 2

## 2023-03-30 MED ORDER — SUGAMMADEX SODIUM 200 MG/2ML IV SOLN
INTRAVENOUS | Status: DC | PRN
  Administered 2023-03-30: 200 mg via INTRAVENOUS

## 2023-03-30 MED ORDER — CEFAZOLIN SODIUM-DEXTROSE 2-4 GM/100ML-% IV SOLN
2.0000 g | INTRAVENOUS | Status: AC
  Administered 2023-03-30: 2 g via INTRAVENOUS
  Filled 2023-03-30: qty 100

## 2023-03-30 MED ORDER — BUPIVACAINE-EPINEPHRINE (PF) 0.25% -1:200000 IJ SOLN
INTRAMUSCULAR | Status: DC | PRN
  Administered 2023-03-30: 30 mL via PERINEURAL

## 2023-03-30 MED ORDER — CHLORHEXIDINE GLUCONATE 0.12 % MT SOLN: 15.0000 mL | Freq: Once | OROMUCOSAL | Status: DC

## 2023-03-30 MED ORDER — PHENYLEPHRINE HCL-NACL 20-0.9 MG/250ML-% IV SOLN
INTRAVENOUS | Status: DC | PRN
Start: 2023-03-30 — End: 2023-03-30
  Administered 2023-03-30: 25 ug/min via INTRAVENOUS

## 2023-03-30 MED ORDER — AMISULPRIDE (ANTIEMETIC) 5 MG/2ML IV SOLN: 10.0000 mg | Freq: Once | INTRAVENOUS | Status: DC | PRN

## 2023-03-30 MED ORDER — ROCURONIUM BROMIDE 100 MG/10ML IV SOLN
INTRAVENOUS | Status: DC | PRN
  Administered 2023-03-30: 30 mg via INTRAVENOUS

## 2023-03-30 MED ORDER — MIDAZOLAM HCL 5 MG/5ML IJ SOLN
INTRAMUSCULAR | Status: DC | PRN
  Administered 2023-03-30: 2 mg via INTRAVENOUS

## 2023-03-30 MED ORDER — FENTANYL CITRATE PF 50 MCG/ML IJ SOSY: 25.0000 ug | PREFILLED_SYRINGE | INTRAMUSCULAR | Status: DC | PRN

## 2023-03-30 MED ORDER — EPINEPHRINE PF 1 MG/ML IJ SOLN
INTRAMUSCULAR | Status: AC
  Filled 2023-03-30: qty 1

## 2023-03-30 MED ORDER — ROCURONIUM BROMIDE 10 MG/ML (PF) SYRINGE
PREFILLED_SYRINGE | INTRAVENOUS | Status: AC
  Filled 2023-03-30: qty 10

## 2023-03-30 MED ORDER — LACTATED RINGERS IV SOLN: INTRAVENOUS | Status: DC

## 2023-03-30 MED ORDER — DEXAMETHASONE SODIUM PHOSPHATE 10 MG/ML IJ SOLN
INTRAMUSCULAR | Status: DC | PRN
Start: 2023-03-30 — End: 2023-03-30
  Administered 2023-03-30: 5 mg via INTRAVENOUS

## 2023-03-30 MED ORDER — ORAL CARE MOUTH RINSE: 15.0000 mL | Freq: Once | OROMUCOSAL | Status: DC

## 2023-03-30 MED ORDER — BUPIVACAINE LIPOSOME 1.3 % IJ SUSP
INTRAMUSCULAR | Status: DC | PRN
  Administered 2023-03-30: 10 mL via PERINEURAL

## 2023-03-30 MED ORDER — ONDANSETRON HCL 4 MG/2ML IJ SOLN
INTRAMUSCULAR | Status: DC | PRN
Start: 2023-03-30 — End: 2023-03-30
  Administered 2023-03-30: 4 mg via INTRAVENOUS

## 2023-03-30 MED ORDER — BUPIVACAINE HCL (PF) 0.5 % IJ SOLN
INTRAMUSCULAR | Status: DC | PRN
Start: 2023-03-30 — End: 2023-03-30
  Administered 2023-03-30: 15 mL via PERINEURAL

## 2023-03-30 MED ORDER — TRANEXAMIC ACID-NACL 1000-0.7 MG/100ML-% IV SOLN
1000.0000 mg | INTRAVENOUS | Status: AC
  Administered 2023-03-30: 1000 mg via INTRAVENOUS
  Filled 2023-03-30: qty 100

## 2023-03-30 MED ORDER — EPHEDRINE 5 MG/ML INJ
INTRAVENOUS | Status: AC
  Filled 2023-03-30: qty 5

## 2023-03-30 MED ORDER — EPHEDRINE SULFATE (PRESSORS) 50 MG/ML IJ SOLN
INTRAMUSCULAR | Status: DC | PRN
Start: 2023-03-30 — End: 2023-03-30
  Administered 2023-03-30: 2.5 mg via INTRAVENOUS
  Administered 2023-03-30: 5 mg via INTRAVENOUS

## 2023-03-30 MED ORDER — PROPOFOL 10 MG/ML IV BOLUS
INTRAVENOUS | Status: AC
  Filled 2023-03-30: qty 20

## 2023-03-30 MED ORDER — DEXAMETHASONE SODIUM PHOSPHATE 10 MG/ML IJ SOLN
INTRAMUSCULAR | Status: AC
  Filled 2023-03-30: qty 1

## 2023-03-30 MED ORDER — ONDANSETRON HCL 4 MG PO TABS: 4.0000 mg | ORAL_TABLET | Freq: Three times a day (TID) | ORAL | 1 refills | Status: DC | PRN

## 2023-03-30 MED ORDER — BUPIVACAINE HCL 0.25 % IJ SOLN
INTRAMUSCULAR | Status: AC
  Filled 2023-03-30: qty 1

## 2023-03-30 MED ORDER — FENTANYL CITRATE (PF) 100 MCG/2ML IJ SOLN
INTRAMUSCULAR | Status: DC | PRN
  Administered 2023-03-30 (×2): 50 ug via INTRAVENOUS

## 2023-03-30 MED ORDER — FENTANYL CITRATE (PF) 100 MCG/2ML IJ SOLN
INTRAMUSCULAR | Status: AC
  Filled 2023-03-30: qty 2

## 2023-03-30 MED ORDER — TRANEXAMIC ACID-NACL 1000-0.7 MG/100ML-% IV SOLN: 1000.0000 mg | Freq: Once | INTRAVENOUS | Status: DC

## 2023-03-30 MED ORDER — LIDOCAINE HCL (PF) 2 % IJ SOLN
INTRAMUSCULAR | Status: AC
  Filled 2023-03-30: qty 5

## 2023-03-30 MED ORDER — 0.9 % SODIUM CHLORIDE (POUR BTL) OPTIME
TOPICAL | Status: DC | PRN
  Administered 2023-03-30: 1000 mL

## 2023-03-30 MED ORDER — ACETAMINOPHEN 10 MG/ML IV SOLN: 1000.0000 mg | Freq: Once | INTRAVENOUS | Status: DC | PRN

## 2023-03-30 MED ORDER — ONDANSETRON HCL 4 MG/2ML IJ SOLN: 4.0000 mg | Freq: Once | INTRAMUSCULAR | Status: DC | PRN

## 2023-03-30 MED ORDER — HYDROCODONE-ACETAMINOPHEN 5-325 MG PO TABS: 1.0000 | ORAL_TABLET | Freq: Four times a day (QID) | ORAL | 0 refills | Status: DC | PRN

## 2023-03-30 MED ORDER — STERILE WATER FOR IRRIGATION IR SOLN
Status: DC | PRN
  Administered 2023-03-30: 2000 mL

## 2023-03-30 MED ORDER — PROPOFOL 10 MG/ML IV BOLUS
INTRAVENOUS | Status: DC | PRN
  Administered 2023-03-30: 200 mg via INTRAVENOUS
  Administered 2023-03-30: 50 mg via INTRAVENOUS
  Administered 2023-03-30: 40 mg via INTRAVENOUS

## 2023-03-30 SURGICAL SUPPLY — 69 items
AID PSTN UNV HD RSTRNT DISP (MISCELLANEOUS) ×1
BAG COUNTER SPONGE SURGICOUNT (BAG) IMPLANT
BAG SPEC THK2 15X12 ZIP CLS (MISCELLANEOUS)
BAG SPNG CNTER NS LX DISP (BAG)
BAG ZIPLOCK 12X15 (MISCELLANEOUS) IMPLANT
BIT DRILL 1.6MX128 (BIT) IMPLANT
BIT DRILL 170X2.5X (BIT) IMPLANT
BIT DRL 170X2.5X (BIT) ×1
BLADE SAG 18X100X1.27 (BLADE) ×1 IMPLANT
COVER BACK TABLE 60X90IN (DRAPES) ×1 IMPLANT
COVER SURGICAL LIGHT HANDLE (MISCELLANEOUS) ×1 IMPLANT
CUP HUMERAL 42 PLUS 3 (Orthopedic Implant) IMPLANT
DRAPE INCISE IOBAN 66X45 STRL (DRAPES) ×1 IMPLANT
DRAPE ORTHO SPLIT 77X108 STRL (DRAPES) ×2
DRAPE SHEET LG 3/4 BI-LAMINATE (DRAPES) ×1 IMPLANT
DRAPE SURG ORHT 6 SPLT 77X108 (DRAPES) ×2 IMPLANT
DRAPE TOP 10253 STERILE (DRAPES) ×1 IMPLANT
DRAPE U-SHAPE 47X51 STRL (DRAPES) ×1 IMPLANT
DRILL 2.5 (BIT) ×1
DRSG ADAPTIC 3X8 NADH LF (GAUZE/BANDAGES/DRESSINGS) ×1 IMPLANT
DURAPREP 26ML APPLICATOR (WOUND CARE) ×1 IMPLANT
ELECT BLADE TIP CTD 4 INCH (ELECTRODE) ×1 IMPLANT
ELECT NDL TIP 2.8 STRL (NEEDLE) ×1 IMPLANT
ELECT NEEDLE TIP 2.8 STRL (NEEDLE) ×1 IMPLANT
ELECT REM PT RETURN 15FT ADLT (MISCELLANEOUS) ×1 IMPLANT
EPIPHSYSI CENTER SZ 2 LT (Shoulder) ×1 IMPLANT
EPIPHYSIS CENTER SZ 2 LT (Shoulder) IMPLANT
FACESHIELD WRAPAROUND (MASK) ×1 IMPLANT
FACESHIELD WRAPAROUND OR TEAM (MASK) ×1 IMPLANT
GAUZE PAD ABD 7.5X8 STRL (GAUZE/BANDAGES/DRESSINGS) IMPLANT
GAUZE PAD ABD 8X10 STRL (GAUZE/BANDAGES/DRESSINGS) ×1 IMPLANT
GAUZE SPONGE 4X4 12PLY STRL (GAUZE/BANDAGES/DRESSINGS) ×1 IMPLANT
GLENOSPHERE XTEND RSA 42 SD +2 (Joint) IMPLANT
GLOVE BIOGEL PI IND STRL 7.5 (GLOVE) ×1 IMPLANT
GLOVE BIOGEL PI IND STRL 8.5 (GLOVE) ×1 IMPLANT
GLOVE ORTHO TXT STRL SZ7.5 (GLOVE) ×1 IMPLANT
GLOVE SURG ORTHO 8.5 STRL (GLOVE) ×1 IMPLANT
GOWN STRL REUS W/ TWL XL LVL3 (GOWN DISPOSABLE) ×2 IMPLANT
GOWN STRL REUS W/TWL XL LVL3 (GOWN DISPOSABLE) ×2
KIT BASIN OR (CUSTOM PROCEDURE TRAY) ×1 IMPLANT
KIT TURNOVER KIT A (KITS) IMPLANT
MANIFOLD NEPTUNE II (INSTRUMENTS) ×1 IMPLANT
METAGLENE DELTA EXTEND (Trauma) IMPLANT
METAGLENE DXTEND (Trauma) ×1 IMPLANT
NDL MAYO CATGUT SZ4 TPR NDL (NEEDLE) IMPLANT
NEEDLE MAYO CATGUT SZ4 (NEEDLE) IMPLANT
NS IRRIG 1000ML POUR BTL (IV SOLUTION) ×1 IMPLANT
PACK SHOULDER (CUSTOM PROCEDURE TRAY) ×1 IMPLANT
PIN GUIDE 1.2 (PIN) IMPLANT
PIN GUIDE GLENOPHERE 1.5MX300M (PIN) IMPLANT
PIN METAGLENE 2.5 (PIN) IMPLANT
RESTRAINT HEAD UNIVERSAL NS (MISCELLANEOUS) ×1 IMPLANT
SCREW 4.5X18MM (Screw) ×1 IMPLANT
SCREW 48L (Screw) IMPLANT
SCREW BN 18X4.5XSTRL SHLDR (Screw) IMPLANT
SCREW LOCK DELTA XTEND 4.5X30 (Screw) IMPLANT
SLING ARM FOAM STRAP LRG (SOFTGOODS) IMPLANT
SPIKE FLUID TRANSFER (MISCELLANEOUS) ×1 IMPLANT
SPONGE T-LAP 4X18 ~~LOC~~+RFID (SPONGE) IMPLANT
STEM 12 HA (Stem) IMPLANT
STRIP CLOSURE SKIN 1/2X4 (GAUZE/BANDAGES/DRESSINGS) ×1 IMPLANT
SUT FIBERWIRE #2 38 T-5 BLUE (SUTURE) ×1
SUT MNCRL AB 4-0 PS2 18 (SUTURE) ×1 IMPLANT
SUT VIC AB 0 CT1 36 (SUTURE) ×1 IMPLANT
SUT VIC AB 0 CT2 27 (SUTURE) ×1 IMPLANT
SUT VIC AB 2-0 CT1 27 (SUTURE) ×1
SUT VIC AB 2-0 CT1 TAPERPNT 27 (SUTURE) ×1 IMPLANT
SUTURE FIBERWR #2 38 T-5 BLUE (SUTURE) ×1 IMPLANT
TOWEL OR 17X26 10 PK STRL BLUE (TOWEL DISPOSABLE) ×1 IMPLANT

## 2023-03-30 NOTE — Op Note (Signed)
NAMEURBAIN, EILER MEDICAL RECORD NO: [id number] ACCOUNT NO: [id number] DATE OF BIRTH: 06-26-1951 FACILITY: [facility] LOCATION: [facility] PHYSICIAN: Almedia Balls. Ranell Patrick, MD  Operative Report   DATE OF PROCEDURE: 03/30/2023  PREOPERATIVE DIAGNOSIS:  Left shoulder end-stage arthritis.  POSTOPERATIVE DIAGNOSIS:  Left shoulder end-stage arthritis.  PROCEDURE PERFORMED:  Left reverse shoulder replacement using DePuy Delta Xtend prosthesis with no subscap repair.  ATTENDING SURGEON:  Almedia Balls. Ranell Patrick, MD  ASSISTANT:  Konrad Felix Dixon, [place], who was scrubbed during the entire procedure, and necessary for satisfactory completion of surgery.  ANESTHESIA:  General anesthesia was used plus interscalene block.  ESTIMATED BLOOD LOSS:  150 mL.  FLUID REPLACEMENT:  1500 mL crystalloid.  COUNTS:  Instrument counts correct.  COMPLICATIONS:  No complications.  ANTIBIOTICS:  Perioperative antibiotics were given.  INDICATIONS:  The patient is a 72 year old male with worsening left shoulder pain secondary to bone-on-bone end-stage arthritis.  The patient presents having failed an extended period of conservative management, desires operative treatment to eliminate  pain and restore function.  Informed consent obtained.  DESCRIPTION OF PROCEDURE:  After an adequate level of anesthesia was achieved, the patient was positioned in a modified beach chair position.  Left shoulder correctly identified and sterile prep and drape performed.  Timeout called, verifying correct  patient, correct site.  We entered the shoulder using a standard deltopectoral approach, starting at the coracoid process and extending down the anterior humerus using a 10 blade scalpel.  Dissection down through the subcutaneous tissues using Bovie.   The cephalic vein was identified and taken laterally with the deltoid pectoralis taken medially.  Conjoined tendon identified and retracted medially.  We placed our deep retractors and then  tenodesed the biceps in situ with 0 Vicryl figure-of-eight  suture x2.  Next, we released the subscapularis remnant off the lesser tuberosity and tagged for protection of the axillary nerve.  We then released the inferior capsule extending the shoulder and delivering the humeral head out of the wound.  The  humerus was devoid of cartilage.  We entered the proximal humerus with a 6 mm reamer, reamed up to a size 12.  We then placed our 12 mm T handle guide and resected the head at 20 degrees of retroversion with the oscillating saw.  We removed the head at  the back table for bone graft.  We then removed excess osteophytes with a rongeur.  Next, we subluxed the humerus posteriorly and gained good exposure of the retroverted glenoid.  We removed the capsule, labrum and we were careful to protect the axillary  nerve.  Once we had good exposure of the glenoid, we placed our deep retractors.  We then noted there to be significant retroversion and placed our guide pin to correct about half of that retroversion.  We placed the guide pin, centered low on the  glenoid and reamed down to subchondral bone.  Once we had our preparation adequately done, we used our peripheral T-hand reamer to make room for the glenosphere.  We then drilled our central peg hole.  We impacted the HA coated press-fit baseplate into  position.  We had good bony support from the 12 o'clock to about the 8 o'clock position with some unreamed bone posterior superiorly, but we felt like we had enough preparation for the baseplate to fit securely in place.  We impacted the HA coated  baseplate into position.  We placed a 48 screw inferiorly, a 30 screw superiorly and an 18 nonlocked anteriorly.  We had excellent purchase with our screws.  We locked our superior and inferior screws.  The 18 anteriorly was nonlocked.  Our baseplate was  secured.  We selected a 42+2 standard glenosphere and attached that to the baseplate given that we had reamed down  several millimeters.  Once we had that baseplate or the glenosphere on the baseplate with a screwdriver secured, we did a finger sweep to  make sure there is no soft tissue caught up and no impinging soft tissue lesions inferiorly.  Next, we went back to the humeral side and reamed for the 2 left metaphysis.  We impacted the trial 12 stem with a 2 left metaphysis set on the 0 setting and  placed in 20 degrees of retroversion.  We reduced with a 42+3 poly onto the humeral tray and reduced the shoulder.  We were happy with our soft tissue stability and balance and range of motion with no impingement.  We removed all trial components,  irrigated thoroughly.  We used available bone graft from the humeral head with impaction grafting technique and impacted the HA coated press fit 12 stem with the HA coated 2 left metaphysis set on the 0 setting and impacted in 20 degrees of retroversion  with the stem stable.  We reduced the shoulder with a 42+3 poly.  We were happy with that tensioning, so we selected the real 42+3 poly impacted on the humeral tray and reduced the shoulder.  We had excellent stability throughout a full arc of motion, no  gapping with inferior pull or external rotation and appropriate conjoined tensioning. We irrigated again and then we resected the subscap remnant.  We then repaired deltopectoral interval with 0 Vicryl suture followed by 2-0 Vicryl for subcutaneous  closure and 4-0 Monocryl for skin.  Steri-Strips applied followed by sterile dressing.  The patient tolerated surgery well.   NIK D: 03/30/2023 9:50:55 am T: 03/30/2023 10:25:00 am  JOB: [id number]/ [id number]

## 2023-03-30 NOTE — Care Plan (Signed)
Ortho Bundle Case Management Note  Patient Details  Name: ANDRES BANTZ MRN: [id number] Date of Birth: 22-Dec-1950                  L Rev TSA on 03-30-23 DCP: Home with wife PT: HEP   DME Arranged:  N/A DME Agency:       Additional Comments: Please contact me with any questions of if this plan should need to change.   Ennis Forts, RN,CCM [facility]  [phone] 03/30/2023, 8:20 AM

## 2023-03-30 NOTE — Anesthesia Procedure Notes (Addendum)
Anesthesia Regional Block: Interscalene brachial plexus block   Pre-Anesthetic Checklist: , timeout performed,  Correct Patient, Correct Site, Correct Laterality,  Correct Procedure, Correct Position, site marked,  Risks and benefits discussed,  Surgical consent,  Pre-op evaluation,  At surgeon's request and post-op pain management  Laterality: Left  Prep: Dura Prep       Needles:  Injection technique: Single-shot  Needle Type: Echogenic Stimulator Needle     Needle Length: 9cm  Needle Gauge: 21     Additional Needles:   Procedures:,,,, ultrasound used (permanent image in chart),,    Narrative:  Start time: 03/30/2023 7:05 AM End time: 03/30/2023 7:15 AM Injection made incrementally with aspirations every 5 mL.  Performed by: Personally  Anesthesiologist: Leonides Grills, MD  Additional Notes: Functioning IV was confirmed and monitors were applied.  A timeout was performed. Sterile prep, hand hygiene and sterile gloves were used. A 90mm 21ga Arrow echogenic stimulator needle was used. Negative aspiration and negative test dose prior to incremental administration of local anesthetic. The patient tolerated the procedure well.  Ultrasound guidance: relevent anatomy identified, needle position confirmed, local anesthetic spread visualized around nerve(s), vascular puncture avoided.  Image printed for medical record.

## 2023-03-30 NOTE — Discharge Instructions (Signed)
Ice to the shoulder constantly. You may remove the surgical bandage on Monday and apply the large BandAid/Aquacel.  Keep the incision covered and clean and dry for one week, then ok to get it wet in the shower. Leave open to air after one week.   Do exercise as instructed several times per day.  DO NOT reach behind your back or push up out of a chair with the operative arm.  Use a sling while you are up and around for comfort, may remove while seated.  Keep pillow propped behind the operative elbow.  Follow up with Dr Ranell Patrick in two weeks in the office, call [phone] for appt  PLEASE call Dr Ranell Patrick (cell) [phone] with any questions or concerns

## 2023-03-30 NOTE — Brief Op Note (Signed)
03/30/2023  9:35 AM  PATIENT:  [name]  72 y.o. male  PRE-OPERATIVE DIAGNOSIS:  Left shoulder osteoarthritis, end stage  POST-OPERATIVE DIAGNOSIS:  Left shoulder osteoarthritis, end stage  PROCEDURE:  Procedure(s): REVERSE SHOULDER ARTHROPLASTY (Left) DePuy Delta Xtend with NO subscap repair  SURGEON:  Surgeons and Role:    Beverely Low, MD - Primary  PHYSICIAN ASSISTANT:   ASSISTANTS: Thea Gist, PA   ANESTHESIA:   regional and general  EBL:  150 mL   BLOOD ADMINISTERED:none  DRAINS: none   LOCAL MEDICATIONS USED:  MARCAINE     SPECIMEN:  No Specimen  DISPOSITION OF SPECIMEN:  N/A  COUNTS:  YES  TOURNIQUET:  * No tourniquets in log *  DICTATION: .Other Dictation: Dictation Number [id number]  PLAN OF CARE: Discharge to home after PACU  PATIENT DISPOSITION:  PACU - hemodynamically stable.   Delay start of Pharmacological VTE agent (>24hrs) due to surgical blood loss or risk of bleeding: not applicable

## 2023-03-30 NOTE — Evaluation (Signed)
Occupational Therapy Evaluation Patient Details Name: [name] MRN: [id number] DOB: 01/07/1951 Today's Date: 03/30/2023   History of Present Illness [name] is a 72 year old man s/p Left rTSA with h/o right shoulder replacement in 2021.   Clinical Impression   Pt is a 72 year old male, s/p reverse shoulder replacement without functional use of Left non-dominant upper extremity secondary to effects of surgery and interscalene block and shoulder precautions. Therapist provided education and instruction to patient and spouse in regards to exercises, precautions, positioning, donning upper extremity clothing and bathing while maintaining shoulder precautions, ice and edema management and donning/doffing sling. Patient and spouse verbalized understanding and demonstrated as needed. Patient needed assistance to donn shirt, underwear, pants, socks and shoes and provided with instruction on compensatory strategies to perform ADLs. Patient limited by decreased ROM in Left shoulder so therefore will need some form of assistance at home. Patient and spouse verbalized and/or demonstrated understanding to all instruction. Patient to follow up with MD for further therapy needs.         If plan is discharge home, recommend the following: A little help with bathing/dressing/bathroom;Help with stairs or ramp for entrance;Assist for transportation;Assistance with cooking/housework    Functional Status Assessment  Patient has had a recent decline in their functional status and demonstrates the ability to make significant improvements in function in a reasonable and predictable amount of time.  Equipment Recommendations  None recommended by OT    Recommendations for Other Services       Precautions / Restrictions Precautions Precautions: Shoulder Type of Shoulder Precautions: rTSA Shoulder Interventions: Shoulder sling/immobilizer Precaution Booklet Issued: Yes (comment) Precaution Comments: Okay  to dangle for bathing. Restrictions Weight Bearing Restrictions: Yes LUE Weight Bearing: Non weight bearing Other Position/Activity Restrictions: Sling at all times except ADL/exercise Yes  Non weight bearing Yes  AROM elbow, wrist and hand to tolerance Yes  PROM of shoulder No  AROM of shoulder No  OT Consult Special Instructions ok for gentle ADLs, hand to face      Mobility Bed Mobility               General bed mobility comments: In PACU recliner    Transfers Overall transfer level: Modified independent                        Balance Overall balance assessment: Mild deficits observed, not formally tested                                         ADL either performed or assessed with clinical judgement   ADL Overall ADL's : Needs assistance/impaired Eating/Feeding: Set up;Sitting   Grooming: Set up;Standing;Sitting;Wash/dry hands   Upper Body Bathing: Minimal assistance;Cueing for UE precautions;Cueing for compensatory techniques;Sitting;With caregiver independent assisting   Lower Body Bathing: Minimal assistance;Cueing for compensatory techniques;With caregiver independent assisting;With adaptive equipment;Sit to/from stand;Sitting/lateral leans   Upper Body Dressing : Minimal assistance;Moderate assistance;Cueing for compensatory techniques;Cueing for UE precautions;Cueing for sequencing;Adhering to UE precautions;With caregiver independent assisting;Sitting   Lower Body Dressing: Minimal assistance;Sitting/lateral leans;Sit to/from stand;Cueing for compensatory techniques;Cueing for sequencing;With caregiver independent assisting   Toilet Transfer: Supervision/safety;Comfort height toilet;Ambulation   Toileting- Clothing Manipulation and Hygiene: Supervision/safety;Minimal assistance       Functional mobility during ADLs: Supervision/safety General ADL Comments: Antalgic gait but no LOB. Per spouse, pt off his naproxin for  surgery. Dr.  Ranell Patrick to roomand stated that he may begin taking again for foot pain.     Vision Baseline Vision/History: 0 No visual deficits       Perception         Praxis         Pertinent Vitals/Pain Pain Assessment Pain Assessment: 0-10 Pain Score: 4  Pain Location: Top of LT foot. Acute Pain Descriptors / Indicators: Sharp Pain Intervention(s): Monitored during session     Extremity/Trunk Assessment Upper Extremity Assessment Upper Extremity Assessment:  (RUE WFL) LUE: Unable to fully assess due to immobilization LUE Sensation: decreased light touch       Cervical / Trunk Assessment Cervical / Trunk Assessment: Neck Surgery   Communication Communication Communication: No apparent difficulties   Cognition Arousal: Alert Behavior During Therapy: WFL for tasks assessed/performed Overall Cognitive Status: Within Functional Limits for tasks assessed                                       General Comments  Baseline    Exercises Other Exercises Other Exercises: Pt able to demonstrate hand exercises to LT hand, then wrist/forearm/elbow to RUE due to LT UE remaining numb in those areas.   Shoulder Instructions Shoulder Instructions Donning/doffing shirt without moving shoulder: Moderate assistance;Caregiver independent with task;Patient able to independently direct caregiver Method for sponge bathing under operated UE: Minimal assistance;Caregiver independent with task;Patient able to independently direct caregiver Donning/doffing sling/immobilizer: Moderate assistance;Caregiver independent with task;Patient able to independently direct caregiver Correct positioning of sling/immobilizer: Minimal assistance;Caregiver independent with task;Patient able to independently direct caregiver Pendulum exercises (written home exercise program): Supervision/safety;Min-guard;Caregiver independent with task ROM for elbow, wrist and digits of operated UE: Independent;Patient  able to independently direct caregiver;Caregiver independent with task Sling wearing schedule (on at all times/off for ADL's): Independent;Patient able to independently direct caregiver;Caregiver independent with task Proper positioning of operated UE when showering: Minimal assistance;Caregiver independent with task;Patient able to independently direct caregiver Positioning of UE while sleeping: Patient able to independently direct caregiver;Caregiver independent with task;Minimal assistance    Home Living Family/patient expects to be discharged to:: Private residence   Available Help at Discharge: Family Type of Home: House       Home Layout: Two level;Full bath on main level;Able to live on main level with bedroom/bathroom     Bathroom Shower/Tub: Producer, television/film/video: Handicapped height     Home Equipment: Shower seat - built in          Prior Functioning/Environment Prior Level of Function : History of Falls (last six months);Independent/Modified Independent;Driving             Mobility Comments: Independent ADLs Comments: Independent        OT Problem List: Impaired balance (sitting and/or standing);Decreased range of motion;Decreased strength      OT Treatment/Interventions:      OT Goals(Current goals can be found in the care plan section) Acute Rehab OT Goals OT Goal Formulation: All assessment and education complete, DC therapy ADL Goals Pt/caregiver will Perform Home Exercise Program: With written HEP provided;With Supervision Additional ADL Goal #1: Pt will demonstrate UE/LE dressing, donning/doffing of sling, correct positioning of LT UE, and compensatory strategies for LT axilla hygiene, as well as use of Ice, while correctly following all shoulder post-op precautions/restrictions.  OT Frequency:      Co-evaluation  AM-PAC OT "6 Clicks" Daily Activity     Outcome Measure Help from another person eating meals?: A  Little Help from another person taking care of personal grooming?: A Little Help from another person toileting, which includes using toliet, bedpan, or urinal?: A Little Help from another person bathing (including washing, rinsing, drying)?: A Little Help from another person to put on and taking off regular upper body clothing?: A Lot Help from another person to put on and taking off regular lower body clothing?: A Little 6 Click Score: 17   End of Session Equipment Utilized During Treatment:  (sling) Nurse Communication: Other (comment) (Ot completed)  Activity Tolerance: Patient tolerated treatment well Patient left: in chair;with family/visitor present  OT Visit Diagnosis: Unsteadiness on feet (R26.81);Muscle weakness (generalized) (M62.81)                Time: 9562-1308 OT Time Calculation (min): 34 min Charges:  OT General Charges $OT Visit: 1 Visit OT Evaluation $OT Eval Low Complexity: 1 Low OT Treatments $Self Care/Home Management : 8-22 mins  Victorino Dike, OT Acute Rehab Services Office: [phone] 03/30/2023  Theodoro Clock 03/30/2023, 12:57 PM

## 2023-03-30 NOTE — Anesthesia Procedure Notes (Signed)
Procedure Name: Intubation Date/Time: 03/30/2023 7:43 AM  Performed by: Sampson Goon, CRNAPre-anesthesia Checklist: Patient identified, Emergency Drugs available, Suction available and Patient being monitored Patient Re-evaluated:Patient Re-evaluated prior to induction Oxygen Delivery Method: Circle System Utilized Preoxygenation: Pre-oxygenation with 100% oxygen Induction Type: IV induction Ventilation: Mask ventilation without difficulty and Oral airway inserted - appropriate to patient size Laryngoscope Size: Glidescope and 4 Grade View: Grade I Tube type: Oral Tube size: 7.5 mm Number of attempts: 1 Airway Equipment and Method: Stylet Placement Confirmation: ETT inserted through vocal cords under direct vision, positive ETCO2 and breath sounds checked- equal and bilateral Secured at: 22 cm Tube secured with: Tape Dental Injury: Teeth and Oropharynx as per pre-operative assessment

## 2023-03-30 NOTE — Transfer of Care (Signed)
Immediate Anesthesia Transfer of Care Note  Patient: [name]  Procedure(s) Performed: REVERSE SHOULDER ARTHROPLASTY (Left: Shoulder)  Patient Location: PACU  Anesthesia Type:General and Regional  Level of Consciousness: awake and drowsy  Airway & Oxygen Therapy: Patient Spontanous Breathing and Patient connected to nasal cannula oxygen  Post-op Assessment: Report given to RN and Post -op Vital signs reviewed and stable  Post vital signs: Reviewed and stable  Last Vitals:  Vitals Value Taken Time  BP 122/66 03/30/23 0932  Temp    Pulse 75 03/30/23 0935  Resp 22 03/30/23 0935  SpO2 92 % 03/30/23 0935  Vitals shown include unfiled device data.  Last Pain:  Vitals:   03/30/23 0555  TempSrc: Oral  PainSc:       Patients Stated Pain Goal: 6 (03/30/23 0546)  Complications: No notable events documented.

## 2023-03-30 NOTE — Interval H&P Note (Signed)
History and Physical Interval Note:  03/30/2023 7:07 AM  [name]  has presented today for surgery, with the diagnosis of Left shoulder osteoarthritis.  The various methods of treatment have been discussed with the patient and family. After consideration of risks, benefits and other options for treatment, the patient has consented to  Procedure(s): REVERSE SHOULDER ARTHROPLASTY (Left) as a surgical intervention.  The patient's history has been reviewed, patient examined, no change in status, stable for surgery.  I have reviewed the patient's chart and labs.  Questions were answered to the patient's satisfaction.     Verlee Rossetti

## 2023-03-31 ENCOUNTER — Encounter (HOSPITAL_COMMUNITY): Payer: Self-pay | Admitting: Orthopedic Surgery

## 2023-04-02 NOTE — Anesthesia Postprocedure Evaluation (Signed)
Anesthesia Post Note  Patient: [name]  Procedure(s) Performed: REVERSE SHOULDER ARTHROPLASTY (Left: Shoulder)     Patient location during evaluation: PACU Anesthesia Type: Regional and General Level of consciousness: awake Pain management: pain level controlled Vital Signs Assessment: post-procedure vital signs reviewed and stable Respiratory status: spontaneous breathing, nonlabored ventilation and respiratory function stable Cardiovascular status: blood pressure returned to baseline and stable Postop Assessment: no apparent nausea or vomiting Anesthetic complications: no   No notable events documented.  Last Vitals:  Vitals:   03/30/23 1100 03/30/23 1145  BP: 125/79 132/86  Pulse: 75 76  Resp:    Temp:    SpO2: 94% 97%    Last Pain:  Vitals:   03/30/23 1145  TempSrc:   PainSc: 0-No pain                 Calyb Mcquarrie P Eudelia Hiltunen

## 2023-04-10 DIAGNOSIS — E871 Hypo-osmolality and hyponatremia: Secondary | ICD-10-CM | POA: Diagnosis not present

## 2023-04-10 DIAGNOSIS — R7303 Prediabetes: Secondary | ICD-10-CM | POA: Diagnosis not present

## 2023-04-10 DIAGNOSIS — E7849 Other hyperlipidemia: Secondary | ICD-10-CM | POA: Diagnosis not present

## 2023-04-10 DIAGNOSIS — R5382 Chronic fatigue, unspecified: Secondary | ICD-10-CM | POA: Diagnosis not present

## 2023-04-12 DIAGNOSIS — Z4789 Encounter for other orthopedic aftercare: Secondary | ICD-10-CM | POA: Diagnosis not present

## 2023-04-16 DIAGNOSIS — R7303 Prediabetes: Secondary | ICD-10-CM | POA: Diagnosis not present

## 2023-04-16 DIAGNOSIS — I2510 Atherosclerotic heart disease of native coronary artery without angina pectoris: Secondary | ICD-10-CM | POA: Diagnosis not present

## 2023-04-16 DIAGNOSIS — E7849 Other hyperlipidemia: Secondary | ICD-10-CM | POA: Diagnosis not present

## 2023-04-16 DIAGNOSIS — R030 Elevated blood-pressure reading, without diagnosis of hypertension: Secondary | ICD-10-CM | POA: Diagnosis not present

## 2023-04-16 DIAGNOSIS — R3915 Urgency of urination: Secondary | ICD-10-CM | POA: Diagnosis not present

## 2023-04-16 DIAGNOSIS — E871 Hypo-osmolality and hyponatremia: Secondary | ICD-10-CM | POA: Diagnosis not present

## 2023-04-16 DIAGNOSIS — I70213 Atherosclerosis of native arteries of extremities with intermittent claudication, bilateral legs: Secondary | ICD-10-CM | POA: Diagnosis not present

## 2023-04-16 DIAGNOSIS — Z23 Encounter for immunization: Secondary | ICD-10-CM | POA: Diagnosis not present

## 2023-05-07 DIAGNOSIS — Z471 Aftercare following joint replacement surgery: Secondary | ICD-10-CM | POA: Diagnosis not present

## 2023-05-07 DIAGNOSIS — M6281 Muscle weakness (generalized): Secondary | ICD-10-CM | POA: Diagnosis not present

## 2023-05-15 DIAGNOSIS — M6281 Muscle weakness (generalized): Secondary | ICD-10-CM | POA: Diagnosis not present

## 2023-05-15 DIAGNOSIS — Z471 Aftercare following joint replacement surgery: Secondary | ICD-10-CM | POA: Diagnosis not present

## 2023-05-17 DIAGNOSIS — M6281 Muscle weakness (generalized): Secondary | ICD-10-CM | POA: Diagnosis not present

## 2023-05-17 DIAGNOSIS — Z471 Aftercare following joint replacement surgery: Secondary | ICD-10-CM | POA: Diagnosis not present

## 2023-05-18 DIAGNOSIS — J929 Pleural plaque without asbestos: Secondary | ICD-10-CM | POA: Diagnosis not present

## 2023-05-18 DIAGNOSIS — R918 Other nonspecific abnormal finding of lung field: Secondary | ICD-10-CM | POA: Diagnosis not present

## 2023-05-18 DIAGNOSIS — R9389 Abnormal findings on diagnostic imaging of other specified body structures: Secondary | ICD-10-CM | POA: Diagnosis not present

## 2023-05-18 DIAGNOSIS — I700 Atherosclerosis of aorta: Secondary | ICD-10-CM | POA: Diagnosis not present

## 2023-05-22 DIAGNOSIS — M6281 Muscle weakness (generalized): Secondary | ICD-10-CM | POA: Diagnosis not present

## 2023-05-22 DIAGNOSIS — Z471 Aftercare following joint replacement surgery: Secondary | ICD-10-CM | POA: Diagnosis not present

## 2023-05-24 DIAGNOSIS — Z4789 Encounter for other orthopedic aftercare: Secondary | ICD-10-CM | POA: Diagnosis not present

## 2023-06-05 DIAGNOSIS — Z471 Aftercare following joint replacement surgery: Secondary | ICD-10-CM | POA: Diagnosis not present

## 2023-06-05 DIAGNOSIS — M6281 Muscle weakness (generalized): Secondary | ICD-10-CM | POA: Diagnosis not present

## 2023-06-07 DIAGNOSIS — M6281 Muscle weakness (generalized): Secondary | ICD-10-CM | POA: Diagnosis not present

## 2023-06-07 DIAGNOSIS — Z471 Aftercare following joint replacement surgery: Secondary | ICD-10-CM | POA: Diagnosis not present

## 2023-06-07 DIAGNOSIS — M542 Cervicalgia: Secondary | ICD-10-CM | POA: Diagnosis not present

## 2023-06-12 DIAGNOSIS — Z471 Aftercare following joint replacement surgery: Secondary | ICD-10-CM | POA: Diagnosis not present

## 2023-06-12 DIAGNOSIS — M6281 Muscle weakness (generalized): Secondary | ICD-10-CM | POA: Diagnosis not present

## 2023-06-12 DIAGNOSIS — M542 Cervicalgia: Secondary | ICD-10-CM | POA: Diagnosis not present

## 2023-06-19 DIAGNOSIS — Z471 Aftercare following joint replacement surgery: Secondary | ICD-10-CM | POA: Diagnosis not present

## 2023-06-19 DIAGNOSIS — M6281 Muscle weakness (generalized): Secondary | ICD-10-CM | POA: Diagnosis not present

## 2023-06-19 DIAGNOSIS — M542 Cervicalgia: Secondary | ICD-10-CM | POA: Diagnosis not present

## 2023-07-16 DIAGNOSIS — E1165 Type 2 diabetes mellitus with hyperglycemia: Secondary | ICD-10-CM | POA: Diagnosis not present

## 2023-07-16 DIAGNOSIS — I10 Essential (primary) hypertension: Secondary | ICD-10-CM | POA: Diagnosis not present

## 2023-07-16 DIAGNOSIS — E119 Type 2 diabetes mellitus without complications: Secondary | ICD-10-CM | POA: Diagnosis not present

## 2023-07-20 DIAGNOSIS — G4733 Obstructive sleep apnea (adult) (pediatric): Secondary | ICD-10-CM | POA: Diagnosis not present

## 2023-07-20 DIAGNOSIS — R918 Other nonspecific abnormal finding of lung field: Secondary | ICD-10-CM | POA: Diagnosis not present

## 2023-07-20 DIAGNOSIS — R9389 Abnormal findings on diagnostic imaging of other specified body structures: Secondary | ICD-10-CM | POA: Diagnosis not present

## 2023-07-20 DIAGNOSIS — J329 Chronic sinusitis, unspecified: Secondary | ICD-10-CM | POA: Diagnosis not present

## 2023-07-24 DIAGNOSIS — H04123 Dry eye syndrome of bilateral lacrimal glands: Secondary | ICD-10-CM | POA: Diagnosis not present

## 2023-07-24 DIAGNOSIS — H1045 Other chronic allergic conjunctivitis: Secondary | ICD-10-CM | POA: Diagnosis not present

## 2023-07-24 DIAGNOSIS — H02831 Dermatochalasis of right upper eyelid: Secondary | ICD-10-CM | POA: Diagnosis not present

## 2023-07-24 DIAGNOSIS — H25813 Combined forms of age-related cataract, bilateral: Secondary | ICD-10-CM | POA: Diagnosis not present

## 2023-07-24 DIAGNOSIS — H02834 Dermatochalasis of left upper eyelid: Secondary | ICD-10-CM | POA: Diagnosis not present

## 2023-07-24 DIAGNOSIS — H353131 Nonexudative age-related macular degeneration, bilateral, early dry stage: Secondary | ICD-10-CM | POA: Diagnosis not present

## 2023-07-26 DIAGNOSIS — J329 Chronic sinusitis, unspecified: Secondary | ICD-10-CM | POA: Diagnosis not present

## 2023-07-26 DIAGNOSIS — R0981 Nasal congestion: Secondary | ICD-10-CM | POA: Diagnosis not present

## 2023-07-26 DIAGNOSIS — R0989 Other specified symptoms and signs involving the circulatory and respiratory systems: Secondary | ICD-10-CM | POA: Diagnosis not present

## 2023-08-03 ENCOUNTER — Ambulatory Visit: Payer: Medicare Other | Admitting: Cardiology

## 2023-08-06 ENCOUNTER — Ambulatory Visit: Payer: Medicare Other | Admitting: Acute Care

## 2023-08-06 ENCOUNTER — Institutional Professional Consult (permissible substitution): Payer: Medicare Other | Admitting: Pulmonary Disease

## 2023-08-06 ENCOUNTER — Encounter: Payer: Self-pay | Admitting: Acute Care

## 2023-08-06 VITALS — BP 130/80 | HR 65 | Temp 97.8°F | Ht 67.0 in | Wt 235.6 lb

## 2023-08-06 DIAGNOSIS — R911 Solitary pulmonary nodule: Secondary | ICD-10-CM

## 2023-08-06 NOTE — Progress Notes (Signed)
History of Present Illness [name] is a 73 y.o. male never smoker referred 07/2023 by Dr. Lynelle [name] for evaluation of growing Right Lower Lobe pulmonary Nodule. He will be followed by Dr. Delton [name] and Matthew Robinsons, NP.   08/06/2023 Lung Nodule Consultation  Pt. Presents for consult. There was an incidental finding of a pulmonary nodule on Surveillance imaging for aneurysm.  This right lower lobe nodule has increased in size from 10 mm to 3.3 mm in a 6 month period of time.Pt. is a never smoker.    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 Imaging shows bilateral calcified pleural plaques consistent with asbestos exposure, though these are not related to the current nodule of concern.  No unexplained weight loss, hemoptysis, or respiratory issues in the past six months. He has lost 58 pounds over the past year while on Ozempic. He experiences persistent nasal congestion and uses Flonase and Afrin for sinus issues, though he limits Afrin use due to its effect on blood pressure.  His medical history includes high blood pressure, a past myocardial infarction, triple bypass surgery, hyperlipidemia, allergic rhinitis, and sleep apnea managed with a CPAP machine. He has undergone neck and back surgery, heart bypass, angioplasty, and stent placement in the past. He is retired, lives with his spouse, and has children. He has no known allergies and has been treated with prednisone.  In terms of family history, his father had stomach cancer and his maternal grandmother had colon cancer. He undergoes regular colonoscopies every five years due to this family history.  We discussed the findings of his most recent CT Chest . I explained that it is concerning that the nodule has grown so much in a short period of time.Plan will be for a PET scan and follow up after to review the results, and determine next  steps. He is in agreement with this plan.   Test Results: CT Chest 05/18/2023 IMPRESSION:  Nodular area in the posteromedial right lower lobe has increased in  size since prior study, now measuring 3.3 x 1.2 cm. Given this  change, consider one of the following in 3 months for both low-risk  and high-risk individuals: (a) repeat chest CT, (b) follow-up  PET-CT, or (c) tissue sampling.   CT chest 11/02/2022 Central airways are patent. Mild bilateral calcified  pleural plaques. Irregular linear nodular subpleural opacity of the  right lower lobe is unchanged when compared with recent prior exam,  reference nodular component measuring 10 mm on series 3, image 114, unchanged. Increased size when compared with July 21, 2020 prior.  No consolidation, pleural effusion or pneumothorax.   Irregular linear nodular subpleural opacity of the right lower  lobe is unchanged when compared with recent prior exam, but  increased when compared with July 21, 2020 prior exam, possibly  scarring. Additional follow-up chest CT is recommended in 6 months  to ensure stability.  2. Bilateral calcified pleural plaques, findings can be seen in the  setting of asbestos exposure.  3. Moderate aortic valve calcifications, findings can be seen in the  setting of aortic valve stenosis or sclerosis. Correlate with  echocardiography.  4. Stable mildly dilated ascending thoracic aorta measuring up to  4.2 cm. Recommend annual imaging followup by CTA or MRA. This  recommendation follows 2010     Latest Ref Rng & Units 03/26/2023   10:31 AM 04/27/2020  1:35 PM 01/31/2020    3:24 AM  CBC  WBC 4.0 - 10.5 K/uL 10.0  5.5    Hemoglobin 13.0 - 17.0 g/dL 57.8  46.9  62.9   Hematocrit 39.0 - 52.0 % 42.8  42.5  38.1   Platelets 150 - 400 K/uL 210  218         Latest Ref Rng & Units 03/26/2023   10:31 AM 04/27/2020    1:35 PM 01/31/2020    3:24 AM  BMP  Glucose 70 - 99 mg/dL 528  413  244   BUN 8 - 23 mg/dL 19  18   12    Creatinine 0.61 - 1.24 mg/dL 0.10  2.72  5.36   Sodium 135 - 145 mmol/L 137  139  137   Potassium 3.5 - 5.1 mmol/L 3.7  4.4  3.7   Chloride 98 - 111 mmol/L 102  103  104   CO2 22 - 32 mmol/L 23  25  24    Calcium 8.9 - 10.3 mg/dL 9.3  9.1  8.6     BNP No results found for: "BNP"  ProBNP    Component Value Date/Time   PROBNP 278.1 (H) 02/21/2014 1148    PFT    Component Value Date/Time   FEV1PRE 3.22 12/29/2013 1231   FEV1POST 3.33 12/29/2013 1231   FVCPRE 3.93 12/29/2013 1231   FVCPOST 4.10 12/29/2013 1231   TLC 7.04 12/29/2013 1231   DLCOUNC 56.05 12/29/2013 1231   PREFEV1FVCRT 82 12/29/2013 1231   PSTFEV1FVCRT 81 12/29/2013 1231    No results found.   Past medical hx Past Medical History:  Diagnosis Date   Aortic root dilatation (HCC)    Arthritis    Coronary atherosclerosis of native coronary artery    Multiple stents - RCA/LAD/diagonal, overlapping DES LAD, stent thrombosis 2005, ultimately CABG   Essential hypertension    H/O hiatal hernia    History of GI bleed    On nonsteroidals   Hyperlipemia    Myocardial infarction [facility])    Anterior with LAD stent thrombosis 2005, 2004 and 2005    Obesity    Paroxysmal atrial flutter (HCC)    Sleep apnea    CPAP daily     Social History   Tobacco Use   Smoking status: Never    Passive exposure: Never   Smokeless tobacco: Former    Types: Chew    Quit date: 07/03/2003  Vaping Use   Vaping status: Never Used  Substance Use Topics   Alcohol use: Yes    Alcohol/week: 1.0 standard drink of alcohol    Types: 1 Cans of beer per week    Comment: 4-5 beers daily   Drug use: No    Mr.[name] reports that he has never smoked. He has never been exposed to tobacco [name]. He quit smokeless tobacco use about 20 years ago.  His smokeless tobacco use included chew. He reports current alcohol use of about 1.0 standard drink of alcohol per week. He reports that he does not use drugs.  Tobacco Cessation: Never  smoker   Past surgical hx, Family hx, Social hx all reviewed.  Current Outpatient Medications on File Prior to Visit  Medication Sig   aspirin 81 MG EC tablet Take 81 mg by mouth at bedtime.    atorvastatin (LIPITOR) 20 MG tablet TAKE 1 TABLET ONCE DAILY. (Patient taking differently: Take 20 mg by mouth every evening.)   b complex vitamins tablet 1 tablet daily. B12-B6-Folic Acid (Sublingual)  Cholecalciferol (VITAMIN D) 125 MCG (5000 UT) CAPS Take 5,000 Units by mouth daily.    Coenzyme Q10 (CO Q-10) 100 MG CAPS Take 100 mg by mouth daily.    cyclobenzaprine (FLEXERIL) 10 MG tablet Take 1 tablet (10 mg total) by mouth 3 (three) times daily as needed for muscle spasms.   EPINEPHrine 0.3 mg/0.3 mL IJ SOAJ injection Inject 0.3 mLs into the skin as needed for anaphylaxis.    HYDROcodone-acetaminophen (NORCO) 5-325 MG tablet Take 1-2 tablets by mouth every 6 (six) hours as needed for moderate pain or severe pain.   Krill Oil 1000 MG CAPS Take 1,000 mg by mouth daily.   loratadine (CLARITIN) 10 MG tablet Take 10 mg by mouth daily as needed for allergies.   losartan (COZAAR) 100 MG tablet Take 100 mg by mouth daily.   LYCOPENE PO Take 1 tablet by mouth daily.   metoprolol (LOPRESSOR) 50 MG tablet Take 50 mg by mouth 2 (two) times daily.   MOUNJARO 15 MG/0.5ML Pen Inject 15 mg into the skin once a week. Mondays   Multiple Vitamins-Minerals (EYE VITAMINS PO) Take 1 capsule by mouth daily. [facility]   naproxen (NAPROSYN) 500 MG tablet Take 500 mg by mouth 2 (two) times daily with a meal.   nitroGLYCERIN (NITROSTAT) 0.4 MG SL tablet Place 0.4 mg under the tongue every 5 (five) minutes as needed for chest pain. Up to 3x   OLIVE LEAF EXTRACT PO Take 1 capsule by mouth daily. 20,000 mg   Pomegranate, [place] gran[place], (POMEGRANATE PO) Take 5,000 mg by mouth daily.   Tadalafil 2.5 MG TABS Take 2.5 mg by mouth every morning.   Turmeric 500 MG TABS Take 500 mg by mouth 2 (two) times daily.     ondansetron (ZOFRAN) 4 MG tablet Take 1 tablet (4 mg total) by mouth every 8 (eight) hours as needed for vomiting, refractory nausea / vomiting or nausea.   No current facility-administered medications on file prior to visit.     Allergies  Allergen Reactions   Bee Venom    Other     Bee stings - lips and tongue swelling, throat tightens    Chlorhexidine Itching    Pt. States on last admission CHG wipes made him itch and it had to be washed off.  Uncertain if benadryl was given.    Review Of Systems:  Constitutional:   No  weight loss, night sweats,  Fevers, chills, fatigue, or  lassitude.  HEENT:   No headaches,  Difficulty swallowing,  Tooth/dental problems, or  Sore throat,                No sneezing, itching, ear ache, nasal congestion, post nasal drip,   CV:  No chest pain,  Orthopnea, PND, swelling in lower extremities, anasarca, dizziness, palpitations, syncope.   GI  No heartburn, indigestion, abdominal pain, nausea, vomiting, diarrhea, change in bowel habits, loss of appetite, bloody stools.   Resp: No shortness of breath with exertion or at rest.  No excess mucus, no productive cough,  No non-productive cough,  No coughing up of blood.  No change in color of mucus.  No wheezing.  No chest wall deformity  Skin: no rash or lesions.  GU: no dysuria, change in color of urine, no urgency or frequency.  No flank pain, no hematuria   MS:  No joint pain or swelling.  No decreased range of motion.  No back pain.  Psych:  No change in mood or  affect. No depression or anxiety.  No memory loss.   Vital Signs BP (!) 144/86 (BP Location: Left Arm, Patient Position: Sitting, Cuff Size: Large)   Pulse 65   Temp 97.8 F (36.6 C) (Oral)   Ht 5\' 7"  (1.702 m)   Wt 235 lb 9.6 oz (106.9 kg)   SpO2 96%   BMI 36.90 kg/m    Physical Exam:  General- No distress,  A&O x 3, pleasant ENT: No sinus tenderness, TM clear, pale nasal mucosa, no oral exudate,no post nasal drip, no  LAN Cardiac: S1, S2, regular rate and rhythm, no murmur Chest: No wheeze/ rales/ dullness; no accessory muscle use, no nasal flaring, no sternal retractions Abd.: Soft Non-tender, ND, BS +, Body mass index is 36.9 kg/m.  Ext: No clubbing cyanosis, edema Neuro:  normal strength, MAE x 4, A&O x 3 Skin: No rashes, warm and dry, No lesions  Psych: normal mood and behavior   Assessment/Plan Nodular area in the posteromedial right lower lobe has increased in  size since prior study (6 month ago) , now measuring 3.3 x 1.2 cm.  Never smoker  Plan We will do a PET scan to better evaluated the pulmonary nodule. I have let the patient care co-ordinators know you are willing to travel to any scanner within [place] [facility].  You will get a call to get this scheduled.  Follow up with Contina Strain 1 week after the scan to go over the results. You will get a call to schedule this visit. Based on results we will determine next best steps, which could mean a biopsy. Call if you cough up any blood, or need [place] sooner. Please contact office for sooner follow up if symptoms do not improve or worsen or seek emergency care    I spent 25 minutes dedicated to the care of this patient on the date of this encounter to include pre-visit review of records, face-to-face time with the patient discussing conditions above, post visit ordering of testing, clinical documentation with the electronic health record, making appropriate referrals as documented, and communicating necessary information to the patient's healthcare team.     Bevelyn Ngo, NP 08/06/2023  3:00 PM

## 2023-08-06 NOTE — Patient Instructions (Addendum)
It is good to see you today. We will do a PET scan to better evaluated the pulmonary nodule. I have let the patient care co-ordinators know you are willing to travel to any scanner within [facility].  You will get a call to get this scheduled.  Follow up with Srihitha Tagliaferri 1 week after the scan to go over the results. You will get a call to schedule this visit. Based on results we will determine next best steps, which could mean a biopsy. Call if you cough up any blood, or need [place] sooner. Please contact office for sooner follow up if symptoms do not improve or worsen or seek emergency care

## 2023-08-08 ENCOUNTER — Encounter (HOSPITAL_COMMUNITY)
Admission: RE | Admit: 2023-08-08 | Discharge: 2023-08-08 | Disposition: A | Discharge status: Home / Self Care | Payer: Medicare Other | Source: Ambulatory Visit | Attending: Acute Care | Admitting: Acute Care

## 2023-08-08 DIAGNOSIS — R911 Solitary pulmonary nodule: Secondary | ICD-10-CM | POA: Insufficient documentation

## 2023-08-08 MED ORDER — FLUDEOXYGLUCOSE F - 18 (FDG) INJECTION: 11.7000 | Freq: Once | INTRAVENOUS | Status: DC | PRN

## 2023-08-14 ENCOUNTER — Ambulatory Visit (HOSPITAL_COMMUNITY)
Admission: RE | Admit: 2023-08-14 | Discharge: 2023-08-14 | Disposition: A | Discharge status: Home / Self Care | Payer: Medicare Other | Source: Ambulatory Visit | Attending: Acute Care | Admitting: Acute Care

## 2023-08-14 DIAGNOSIS — E782 Mixed hyperlipidemia: Secondary | ICD-10-CM

## 2023-08-14 DIAGNOSIS — Z96611 Presence of right artificial shoulder joint: Secondary | ICD-10-CM

## 2023-08-14 DIAGNOSIS — Z4651 Encounter for fitting and adjustment of gastric lap band: Secondary | ICD-10-CM | POA: Diagnosis not present

## 2023-08-14 DIAGNOSIS — Z951 Presence of aortocoronary bypass graft: Secondary | ICD-10-CM | POA: Diagnosis not present

## 2023-08-14 DIAGNOSIS — I77810 Thoracic aortic ectasia: Secondary | ICD-10-CM | POA: Diagnosis not present

## 2023-08-14 DIAGNOSIS — R911 Solitary pulmonary nodule: Secondary | ICD-10-CM

## 2023-08-14 DIAGNOSIS — Z9884 Bariatric surgery status: Secondary | ICD-10-CM | POA: Diagnosis not present

## 2023-08-14 DIAGNOSIS — I4892 Unspecified atrial flutter: Secondary | ICD-10-CM | POA: Diagnosis not present

## 2023-08-14 DIAGNOSIS — I700 Atherosclerosis of aorta: Secondary | ICD-10-CM | POA: Insufficient documentation

## 2023-08-14 DIAGNOSIS — I10 Essential (primary) hypertension: Secondary | ICD-10-CM | POA: Diagnosis not present

## 2023-08-14 LAB — GLUCOSE, CAPILLARY: Glucose-Capillary: 94 mg/dL (ref 70–99)

## 2023-08-14 MED ORDER — FLUDEOXYGLUCOSE F - 18 (FDG) INJECTION
11.8000 | Freq: Once | INTRAVENOUS | Status: AC
  Administered 2023-08-14: 12.22 via INTRAVENOUS

## 2023-08-29 ENCOUNTER — Telehealth: Payer: Self-pay | Admitting: Acute Care

## 2023-08-29 NOTE — Telephone Encounter (Signed)
 [facility] - call placed to reading room to push this through.   FYI, thanks!

## 2023-08-29 NOTE — Telephone Encounter (Signed)
 I have called the patient with his PET scan results. His scan was done 08/08/2023 and read 08/29/2023. He has called the office several times for results. PET shows an enlarging right lower lobe pulmonary nodule which demonstrates continued growth and increasingly  solid component. This has an SUV max of 2.5, but is concerning for a possible slow growing adenocarcinoma. Recommendation is for tissue sampling. Patient states he would like to have this biopsied. Dr. Delton Coombes has an opening for 10:30 tomorrow 08/30/2023. Patient states he can make that appointment . Dr. Delton Coombes, can you please review PET and discuss biopsy of the nodule of concern in the office tomorrow? Thanks so much.

## 2023-08-29 NOTE — Telephone Encounter (Signed)
 Will do!

## 2023-08-30 ENCOUNTER — Ambulatory Visit: Payer: Medicare Other | Admitting: Emergency Medicine

## 2023-08-30 ENCOUNTER — Encounter: Payer: Self-pay | Admitting: Emergency Medicine

## 2023-08-30 VITALS — BP 155/95 | HR 78 | Temp 97.4°F | Ht 67.0 in | Wt 233.6 lb

## 2023-08-30 DIAGNOSIS — R911 Solitary pulmonary nodule: Secondary | ICD-10-CM | POA: Diagnosis not present

## 2023-08-30 NOTE — Patient Instructions (Addendum)
 We reviewed your CT scans of the chest and your PET scan today. We will arrange for navigational bronchoscopy to evaluate a right lower lobe pulmonary nodule.  This will be done as an outpatient under general anesthesia at [facility] endoscopy.  You will need a designated driver and someone to watch you that day after you get home.  You will need to stop your aspirin 2 days prior and stop your Mounjaro 1 week prior.  Will try to get this set up for 3/10 or 09/11/2023. Follow in our office 1 week after bronchoscopy to review those results.

## 2023-08-30 NOTE — Assessment & Plan Note (Addendum)
 Reviewed the PET scan and CT scans with him today.  The PET scan is reassuring without any evidence of hypermetabolism.  That said the lesion has significantly increased in size on serial imaging.  Discussed the possibility that this could be scar versus inflammatory versus well-differentiated adenocarcinoma.  I have recommended bronchoscopy with biopsies.  This will be done robotically.  He will likely need to be done in the left lateral decubitus position because the right lower lobe pulmonary nodule is very inferior and medial   We reviewed your CT scans of the chest and your PET scan today. We will arrange for navigational bronchoscopy to evaluate a right lower lobe pulmonary nodule.  This will be done as an outpatient under general anesthesia at [facility] endoscopy.  You will need a designated driver and someone to watch you that day after you get home.  You will need to stop your aspirin 2 days prior and stop your Mounjaro 1 week prior.  Will try to get this set up for 3/10 or 09/11/2023. Follow in our office 1 week after bronchoscopy to review those results.

## 2023-08-30 NOTE — H&P (View-Only) (Signed)
 Subjective:    Patient ID: [name], male    DOB: Jun 15, 1951, 73 y.o.   MRN: [id number]  HPI [name] is 25, never smoker with a history of CAD/PCI, hypertension, GERD/PUD with a hiatal hernia, paroxysmal atrial flutter, OSA on CPAP.  He had an incidental finding of right lower lobe pulmonary nodule noted on a CT chest for surveillance of aortic aneurysm.  A right lower lobe pulmonary nodule increased in size over 6 months between May and November 2024.  This prompted PET scan and further consultation today  CT scan of the chest 05/18/2023 shows a 3.3 x 1.2 right lower lobe pulmonary nodule increase in size compared with May.  He also has bilateral pleural plaques.    PET scan 08/14/2023 reviewed by me, shows no evidence of hypermetabolic mediastinal or hilar adenopathy.  The medial right lower lobe pulmonary nodule is 3.3 x 1.2 cm with no significant hypermetabolic activity.  There is no distant hypermetabolic activity   Review of Systems As per HPI  Past Medical History:  Diagnosis Date   Aortic root dilatation (HCC)    Arthritis    Coronary atherosclerosis of native coronary artery    Multiple stents - RCA/LAD/diagonal, overlapping DES LAD, stent thrombosis 2005, ultimately CABG   Essential hypertension    H/O hiatal hernia    History of GI bleed    On nonsteroidals   Hyperlipemia    Myocardial infarction (HCC)    Anterior with LAD stent thrombosis 2005, 2004 and 2005    Obesity    Paroxysmal atrial flutter (HCC)    Sleep apnea    CPAP daily     Family History  Problem Relation Age of Onset   Colon cancer Father    Obesity Mother    Colon cancer Maternal Grandmother    Diabetes Maternal Grandfather      Social History   Socioeconomic History   Marital status: Married    Spouse name: Not on file   Number of children: Not on file   Years of education: Not on file   Highest education level: Not on file  Occupational History   Not on file  Tobacco Use    Smoking status: Never    Passive exposure: Never   Smokeless tobacco: Former    Types: Chew    Quit date: 07/03/2003  Vaping Use   Vaping status: Never Used  Substance and Sexual Activity   Alcohol use: Yes    Alcohol/week: 1.0 standard drink of alcohol    Types: 1 Cans of beer per week    Comment: 4-5 beers daily   Drug use: No   Sexual activity: Not on file  Other Topics Concern   Not on file  Social History Narrative   Not on file   Social Drivers of Health   Financial Resource Strain: Not on file  Food Insecurity: Not on file  Transportation Needs: Not on file  Physical Activity: Not on file  Stress: Not on file  Social Connections: Not on file  Intimate Partner Violence: Not on file     Allergies  Allergen Reactions   Bee Venom    Other     Bee stings - lips and tongue swelling, throat tightens    Chlorhexidine Itching    Pt. States on last admission CHG wipes made him itch and it had to be washed off.  Uncertain if benadryl was given.     Outpatient Medications Prior to Visit  Medication  Sig Dispense Refill   aspirin 81 MG EC tablet Take 81 mg by mouth at bedtime.      atorvastatin (LIPITOR) 20 MG tablet TAKE 1 TABLET ONCE DAILY. (Patient taking differently: Take 20 mg by mouth every evening.) 30 tablet 6   b complex vitamins tablet 1 tablet daily. B12-B6-Folic Acid (Sublingual)     Cholecalciferol (VITAMIN D) 125 MCG (5000 UT) CAPS Take 5,000 Units by mouth daily.      Coenzyme Q10 (CO Q-10) 100 MG CAPS Take 100 mg by mouth daily.      cyclobenzaprine (FLEXERIL) 10 MG tablet Take 1 tablet (10 mg total) by mouth 3 (three) times daily as needed for muscle spasms. 30 tablet 0   EPINEPHrine 0.3 mg/0.3 mL IJ SOAJ injection Inject 0.3 mLs into the skin as needed for anaphylaxis.      Krill Oil 1000 MG CAPS Take 1,000 mg by mouth daily.     loratadine (CLARITIN) 10 MG tablet Take 10 mg by mouth daily as needed for allergies.     losartan (COZAAR) 100 MG tablet  Take 100 mg by mouth daily.     LYCOPENE PO Take 1 tablet by mouth daily.     metoprolol (LOPRESSOR) 50 MG tablet Take 50 mg by mouth 2 (two) times daily.     MOUNJARO 15 MG/0.5ML Pen Inject 15 mg into the skin once a week. Mondays     Multiple Vitamins-Minerals (EYE VITAMINS PO) Take 1 capsule by mouth daily. [employer]     naproxen (NAPROSYN) 500 MG tablet Take 500 mg by mouth 2 (two) times daily with a meal.     nitroGLYCERIN (NITROSTAT) 0.4 MG SL tablet Place 0.4 mg under the tongue every 5 (five) minutes as needed for chest pain. Up to 3x     OLIVE LEAF EXTRACT PO Take 1 capsule by mouth daily. 20,000 mg     Pomegranate, [place] granatum, (POMEGRANATE PO) Take 5,000 mg by mouth daily.     Tadalafil 2.5 MG TABS Take 2.5 mg by mouth every morning.     Turmeric 500 MG TABS Take 500 mg by mouth 2 (two) times daily.      HYDROcodone-acetaminophen (NORCO) 5-325 MG tablet Take 1-2 tablets by mouth every 6 (six) hours as needed for moderate pain or severe pain. (Patient not taking: Reported on 08/30/2023) 30 tablet 0   ondansetron (ZOFRAN) 4 MG tablet Take 1 tablet (4 mg total) by mouth every 8 (eight) hours as needed for vomiting, refractory nausea / vomiting or nausea. (Patient not taking: Reported on 08/30/2023) 30 tablet 1   No facility-administered medications prior to visit.        Objective:   Physical Exam Vitals:   08/30/23 1033  BP: (!) 155/95  Pulse: 78  Temp: (!) 97.4 F (36.3 C)  TempSrc: Temporal  SpO2: 94%  Weight: 233 lb 9.6 oz (106 kg)  Height: 5\' 7"  (1.702 m)   Gen: Pleasant, obese man in no distress,  normal affect  ENT: No lesions,  mouth clear,  oropharynx clear, no postnasal drip  Neck: No JVD, no stridor  Lungs: No use of accessory muscles, no crackles or wheezing on normal respiration, no wheeze on forced expiration  Cardiovascular: RRR, heart sounds normal, no murmur or gallops, no peripheral edema  Musculoskeletal: No deformities, no cyanosis or  clubbing  Neuro: alert, awake, non focal  Skin: Warm, no lesions or rash       Assessment & Plan:   Pulmonary  nodule 1 cm or greater in diameter Reviewed the PET scan and CT scans with him today.  The PET scan is reassuring without any evidence of hypermetabolism.  That said the lesion has significantly increased in size on serial imaging.  Discussed the possibility that this could be scar versus inflammatory versus well-differentiated adenocarcinoma.  I have recommended bronchoscopy with biopsies.  This will be done robotically.  He will likely need to be done in the left lateral decubitus position because the right lower lobe pulmonary nodule is very inferior and medial   We reviewed your CT scans of the chest and your PET scan today. We will arrange for navigational bronchoscopy to evaluate a right lower lobe pulmonary nodule.  This will be done as an outpatient under general anesthesia at [facility] endoscopy.  You will need a designated driver and someone to watch you that day after you get home.  You will need to stop your aspirin 2 days prior and stop your Mounjaro 1 week prior.  Will try to get this set up for 3/10 or 09/11/2023. Follow in our office 1 week after bronchoscopy to review those results.  Time spent 41 minutes  Levy Pupa, MD, PhD 08/30/2023, 5:20 PM [facility] [phone] or if no answer before 7:00PM call [phone] For any issues after 7:00PM please call eLink [phone]

## 2023-08-30 NOTE — Progress Notes (Signed)
 Subjective:    Patient ID: [name], male    DOB: Jun 15, 1951, 73 y.o.   MRN: [id number]  HPI [name] is 25, never smoker with a history of CAD/PCI, hypertension, GERD/PUD with a hiatal hernia, paroxysmal atrial flutter, OSA on CPAP.  He had an incidental finding of right lower lobe pulmonary nodule noted on a CT chest for surveillance of aortic aneurysm.  A right lower lobe pulmonary nodule increased in size over 6 months between May and November 2024.  This prompted PET scan and further consultation today  CT scan of the chest 05/18/2023 shows a 3.3 x 1.2 right lower lobe pulmonary nodule increase in size compared with May.  He also has bilateral pleural plaques.    PET scan 08/14/2023 reviewed by me, shows no evidence of hypermetabolic mediastinal or hilar adenopathy.  The medial right lower lobe pulmonary nodule is 3.3 x 1.2 cm with no significant hypermetabolic activity.  There is no distant hypermetabolic activity   Review of Systems As per HPI  Past Medical History:  Diagnosis Date   Aortic root dilatation (HCC)    Arthritis    Coronary atherosclerosis of native coronary artery    Multiple stents - RCA/LAD/diagonal, overlapping DES LAD, stent thrombosis 2005, ultimately CABG   Essential hypertension    H/O hiatal hernia    History of GI bleed    On nonsteroidals   Hyperlipemia    Myocardial infarction (HCC)    Anterior with LAD stent thrombosis 2005, 2004 and 2005    Obesity    Paroxysmal atrial flutter (HCC)    Sleep apnea    CPAP daily     Family History  Problem Relation Age of Onset   Colon cancer Father    Obesity Mother    Colon cancer Maternal Grandmother    Diabetes Maternal Grandfather      Social History   Socioeconomic History   Marital status: Married    Spouse name: Not on file   Number of children: Not on file   Years of education: Not on file   Highest education level: Not on file  Occupational History   Not on file  Tobacco Use    Smoking status: Never    Passive exposure: Never   Smokeless tobacco: Former    Types: Chew    Quit date: 07/03/2003  Vaping Use   Vaping status: Never Used  Substance and Sexual Activity   Alcohol use: Yes    Alcohol/week: 1.0 standard drink of alcohol    Types: 1 Cans of beer per week    Comment: 4-5 beers daily   Drug use: No   Sexual activity: Not on file  Other Topics Concern   Not on file  Social History Narrative   Not on file   Social Drivers of Health   Financial Resource Strain: Not on file  Food Insecurity: Not on file  Transportation Needs: Not on file  Physical Activity: Not on file  Stress: Not on file  Social Connections: Not on file  Intimate Partner Violence: Not on file     Allergies  Allergen Reactions   Bee Venom    Other     Bee stings - lips and tongue swelling, throat tightens    Chlorhexidine Itching    Pt. States on last admission CHG wipes made him itch and it had to be washed off.  Uncertain if benadryl was given.     Outpatient Medications Prior to Visit  Medication  Sig Dispense Refill   aspirin 81 MG EC tablet Take 81 mg by mouth at bedtime.      atorvastatin (LIPITOR) 20 MG tablet TAKE 1 TABLET ONCE DAILY. (Patient taking differently: Take 20 mg by mouth every evening.) 30 tablet 6   b complex vitamins tablet 1 tablet daily. B12-B6-Folic Acid (Sublingual)     Cholecalciferol (VITAMIN D) 125 MCG (5000 UT) CAPS Take 5,000 Units by mouth daily.      Coenzyme Q10 (CO Q-10) 100 MG CAPS Take 100 mg by mouth daily.      cyclobenzaprine (FLEXERIL) 10 MG tablet Take 1 tablet (10 mg total) by mouth 3 (three) times daily as needed for muscle spasms. 30 tablet 0   EPINEPHrine 0.3 mg/0.3 mL IJ SOAJ injection Inject 0.3 mLs into the skin as needed for anaphylaxis.      Krill Oil 1000 MG CAPS Take 1,000 mg by mouth daily.     loratadine (CLARITIN) 10 MG tablet Take 10 mg by mouth daily as needed for allergies.     losartan (COZAAR) 100 MG tablet  Take 100 mg by mouth daily.     LYCOPENE PO Take 1 tablet by mouth daily.     metoprolol (LOPRESSOR) 50 MG tablet Take 50 mg by mouth 2 (two) times daily.     MOUNJARO 15 MG/0.5ML Pen Inject 15 mg into the skin once a week. Mondays     Multiple Vitamins-Minerals (EYE VITAMINS PO) Take 1 capsule by mouth daily. [employer]     naproxen (NAPROSYN) 500 MG tablet Take 500 mg by mouth 2 (two) times daily with a meal.     nitroGLYCERIN (NITROSTAT) 0.4 MG SL tablet Place 0.4 mg under the tongue every 5 (five) minutes as needed for chest pain. Up to 3x     OLIVE LEAF EXTRACT PO Take 1 capsule by mouth daily. 20,000 mg     Pomegranate, Punica granatum, (POMEGRANATE PO) Take 5,000 mg by mouth daily.     Tadalafil 2.5 MG TABS Take 2.5 mg by mouth every morning.     Turmeric 500 MG TABS Take 500 mg by mouth 2 (two) times daily.      HYDROcodone-acetaminophen (NORCO) 5-325 MG tablet Take 1-2 tablets by mouth every 6 (six) hours as needed for moderate pain or severe pain. (Patient not taking: Reported on 08/30/2023) 30 tablet 0   ondansetron (ZOFRAN) 4 MG tablet Take 1 tablet (4 mg total) by mouth every 8 (eight) hours as needed for vomiting, refractory nausea / vomiting or nausea. (Patient not taking: Reported on 08/30/2023) 30 tablet 1   No facility-administered medications prior to visit.        Objective:   Physical Exam Vitals:   08/30/23 1033  BP: (!) 155/95  Pulse: 78  Temp: (!) 97.4 F (36.3 C)  TempSrc: Temporal  SpO2: 94%  Weight: 233 lb 9.6 oz (106 kg)  Height: 5\' 7"  (1.702 m)   Gen: Pleasant, obese man in no distress,  normal affect  ENT: No lesions,  mouth clear,  oropharynx clear, no postnasal drip  Neck: No JVD, no stridor  Lungs: No use of accessory muscles, no crackles or wheezing on normal respiration, no wheeze on forced expiration  Cardiovascular: RRR, heart sounds normal, no murmur or gallops, no peripheral edema  Musculoskeletal: No deformities, no cyanosis or  clubbing  Neuro: alert, awake, non focal  Skin: Warm, no lesions or rash       Assessment & Plan:   Pulmonary  nodule 1 cm or greater in diameter Reviewed the PET scan and CT scans with him today.  The PET scan is reassuring without any evidence of hypermetabolism.  That said the lesion has significantly increased in size on serial imaging.  Discussed the possibility that this could be scar versus inflammatory versus well-differentiated adenocarcinoma.  I have recommended bronchoscopy with biopsies.  This will be done robotically.  He will likely need to be done in the left lateral decubitus position because the right lower lobe pulmonary nodule is very inferior and medial   We reviewed your CT scans of the chest and your PET scan today. We will arrange for navigational bronchoscopy to evaluate a right lower lobe pulmonary nodule.  This will be done as an outpatient under general anesthesia at [facility] endoscopy.  You will need a designated driver and someone to watch you that day after you get home.  You will need to stop your aspirin 2 days prior and stop your Mounjaro 1 week prior.  Will try to get this set up for 3/10 or 09/11/2023. Follow in our office 1 week after bronchoscopy to review those results.  Time spent 41 minutes  Levy Pupa, MD, PhD 08/30/2023, 5:20 PM [facility] [phone] or if no answer before 7:00PM call [phone] For any issues after 7:00PM please call eLink [phone]

## 2023-09-05 ENCOUNTER — Ambulatory Visit
Admission: RE | Admit: 2023-09-05 | Discharge: 2023-09-05 | Disposition: A | Discharge status: Home / Self Care | Payer: Medicare Other | Source: Ambulatory Visit | Attending: Emergency Medicine | Admitting: Emergency Medicine

## 2023-09-05 DIAGNOSIS — R911 Solitary pulmonary nodule: Secondary | ICD-10-CM

## 2023-09-05 DIAGNOSIS — Z951 Presence of aortocoronary bypass graft: Secondary | ICD-10-CM | POA: Diagnosis not present

## 2023-09-05 DIAGNOSIS — I2510 Atherosclerotic heart disease of native coronary artery without angina pectoris: Secondary | ICD-10-CM | POA: Diagnosis not present

## 2023-09-10 ENCOUNTER — Other Ambulatory Visit: Payer: Self-pay

## 2023-09-10 ENCOUNTER — Encounter (HOSPITAL_COMMUNITY): Payer: Self-pay | Admitting: Emergency Medicine

## 2023-09-10 NOTE — Pre-Procedure Instructions (Signed)
-------------    SDW INSTRUCTIONS given:  Your procedure is scheduled on 3/11.  Report to [facility] Main Entrance "A" at 05:30 A.M., and check in at the Admitting office.  Any questions or running late day of surgery: call [phone]    Remember:  Do not eat or drink after midnight the night before your surgery     Take these medicines the morning of surgery with A SIP OF WATER Metoprolol      May take these medicines IF NEEDED: flexeril  claritin  nitro   Stop Aspirin 2 days prior to surgery. Last dose 3/8.  Stop Mounjaro 1 week prior to surgery. Last dose 2/24.  As of today, STOP taking any Aleve, Naproxen, Ibuprofen, Motrin, Advil, Goody's, BC's, all herbal medications, fish oil, and all vitamins.   Do NOT Smoke (Tobacco/Vaping) 24 hours prior to your procedure  If you use a CPAP at night, you may bring all equipment for your overnight stay.     You will be asked to remove any contacts, glasses, piercing's, hearing aid's, dentures/partials prior to surgery. Please bring cases for these items if needed.     Patients discharged the day of surgery will not be allowed to drive home, and someone needs to stay with them for 24 hours.  SURGICAL WAITING ROOM VISITATION Patients may have no more than 2 support people in the waiting area - these visitors may rotate.   Pre-op nurse will coordinate an appropriate time for 1 ADULT support person, who may not rotate, to accompany patient in pre-op.  Children under the age of 68 must have an adult with them who is not the patient and must remain in the main waiting area with an adult.  If the patient needs to stay at the hospital during part of their recovery, the visitor guidelines for inpatient rooms apply.  Please refer to the [facility] website for the visitor guidelines for any additional information.   Special instructions:   [place]- Preparing For Surgery   Please follow these instructions carefully.   Shower  the NIGHT BEFORE SURGERY and the MORNING OF SURGERY with DIAL Soap.   Pat yourself dry with a CLEAN TOWEL.  Wear CLEAN PAJAMAS to bed the night before surgery  Place CLEAN SHEETS on your bed the night of your first shower and DO NOT SLEEP WITH PETS.   Additional instructions for the day of surgery: DO NOT APPLY any lotions, deodorants, cologne, or perfumes.   Do not wear jewelry or makeup Do not wear nail polish, gel polish, artificial nails, or any other type of covering on natural nails (fingers and toes) Do not bring valuables to the hospital. [facility] is not responsible for valuables/personal belongings. Put on clean/comfortable clothes.  Please brush your teeth.  Ask your nurse before applying any prescription medications to the skin.

## 2023-09-10 NOTE — Progress Notes (Signed)
 Anesthesia Chart Review: Same day workup  73 year old male follows with cardiology for history of CAD s/p CABG 2015, paroxysmal atrial flutter, mild AS (mean gradient 10.8 mmHg on echo 08/2021), OSA on CPAP, TAA and aortic root dilatation. He is not anticoagulated for A.flutter due to this being a transient problem ~2015.  Patient was last seen by Dr. Diona Browner on 01/23/2023, stable at that time, no changes to management.  He was also cleared for reverse shoulder arthroplasty which he did subsequently undergo on 03/30/2023 without complication.   He had an incidental finding of right lower lobe pulmonary nodule noted on a CT chest for surveillance of aortic aneurysm.  A right lower lobe pulmonary nodule increased in size over 6 months between May and November 2024.  CT scan of the chest 05/18/2023 shows a 3.3 x 1.2 right lower lobe pulmonary nodule increase in size compared with May.  PET scan 08/14/2023 showed no evidence of hypermetabolic mediastinal or hilar adenopathy.  The medial right lower lobe pulmonary nodule is 3.3 x 1.2 cm with no significant hypermetabolic activity.  There is no distant hypermetabolic activity.  Dr. Delton Coombes recommended bronchoscopy with biopsies.  Other pertinent history includes GERD/PUD with hiatal hernia.  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  Patient will need day of surgery labs and evaluation.  EKG 01/23/2023: Sinus rhythm with sinus arrhythmia with 1st degree A-V block. Rate 63. Septal infarct.   TTE 09/20/2021: 1. Left ventricular ejection fraction, by estimation, is 55 to 60%. The  left ventricle has normal function. Left ventricular endocardial border  not optimally defined to evaluate regional wall motion. There is mild left  ventricular hypertrophy. Left  ventricular diastolic  parameters are consistent with Grade I diastolic  dysfunction (impaired relaxation).   2. Right ventricular systolic function was not well visualized. The right  ventricular size is not well visualized. Tricuspid regurgitation signal is  inadequate for assessing PA pressure.   3. The mitral valve is abnormal. Trivial mitral valve regurgitation. No  evidence of mitral stenosis.   4. The aortic valve is tricuspid. There is moderate calcification of the  aortic valve. There is moderate thickening of the aortic valve. Aortic  valve regurgitation is mild. Mild aortic valve stenosis. Aortic valve mean  gradient measures 10.8 mmHg.  Aortic valve peak gradient measures 17.7 mmHg. Aortic valve area, by VTI  measures 2.12 cm.   5. Aortic dilatation noted. There is mild dilatation of the aortic root,  measuring 41 mm. There is moderate dilatation of the ascending aorta,  measuring 44 mm.   6. The inferior vena cava is normal in size with greater than 50%  respiratory variability, suggesting right atrial pressure of 3 mmHg.   Comparison(s): LVEF 55-60%, Aortic Root 4.1 cm.    Nuclear stress 10/16/2019: ? No diagnostic ST segment changes to indicate ischemia. ? Small size, moderate intensity, partially reversible defect in the mid to apical inferior and predominantly mid inferolateral wall. This is consistent with scar and a mild region of peri-infarct ischemia. ? This is a low risk study. ? Nuclear stress EF: 57%.     Zannie Cove [facility] Short Stay Center/Anesthesiology Phone [phone] 09/10/2023 2:20 PM

## 2023-09-10 NOTE — Anesthesia Preprocedure Evaluation (Addendum)
 Anesthesia Evaluation  Patient identified by MRN, date of birth, ID band Patient awake    Reviewed: Allergy & Precautions, NPO status , Patient's Chart, lab work & pertinent test results, reviewed documented beta blocker date and time   History of Anesthesia Complications Negative for: history of anesthetic complications  Airway Mallampati: II  TM Distance: >3 FB Neck ROM: Full    Dental  (+) Dental Advisory Given   Pulmonary sleep apnea and Continuous Positive Airway Pressure Ventilation    breath sounds clear to auscultation       Cardiovascular hypertension, Pt. on medications and Pt. on home beta blockers (-) angina + CAD, + Past MI, + Cardiac Stents, + CABG and + Peripheral Vascular Disease (aortic root dilation)  + dysrhythmias Atrial Fibrillation  Rhythm:Regular Rate:Normal  '23 ECHO: EF 55 to 60%.  1. The LV has normal function.  mild left LVH, Grade I diastolic dysfunction (impaired relaxation).   2. RVF was not well visualized. The right ventricular size is not well visualized. Tricuspid regurgitation signal is inadequate for assessing PA pressure.   3. The mitral valve is abnormal. Trivial mitral valve regurgitation. No  evidence of mitral stenosis.   4. The aortic valve is tricuspid. There is moderate calcification of the aortic valve. There is moderate thickening of the aortic valve. Aortic valve regurgitation is mild. Mild aortic valve stenosis, mean gradient measures 10.8 mmHg. peak gradient measures 17.7 mmHg. Aortic valve area, by VTI measures 2.12 cm.   5. Aortic dilatation noted. There is mild dilatation of the aortic root, measuring 41 mm. There is moderate dilatation of the ascending aorta, 44 mm.     Neuro/Psych negative neurological ROS     GI/Hepatic Neg liver ROS,GERD  Medicated and Controlled,,  Endo/Other  Mounjaro: last 2 weeks ago BMI 36.6  Renal/GU negative Renal ROS     Musculoskeletal    Abdominal   Peds  Hematology negative hematology ROS (+)   Anesthesia Other Findings   Reproductive/Obstetrics                             Anesthesia Physical Anesthesia Plan  ASA: 3  Anesthesia Plan: General   Post-op Pain Management: Tylenol PO (pre-op)*   Induction: Intravenous  PONV Risk Score and Plan: 2 and Ondansetron and Dexamethasone  Airway Management Planned: Oral ETT  Additional Equipment: None  Intra-op Plan:   Post-operative Plan: Extubation in OR  Informed Consent: I have reviewed the patients History and Physical, chart, labs and discussed the procedure including the risks, benefits and alternatives for the proposed anesthesia with the patient or authorized representative who has indicated his/her understanding and acceptance.     Dental advisory given  Plan Discussed with: CRNA and Surgeon  Anesthesia Plan Comments: (PAT note by Antionette Poles, PA-C:  73 year old male follows with cardiology for history of CAD s/p CABG 2015, paroxysmal atrial flutter, mild AS (mean gradient 10.8 mmHg on echo 08/2021), OSA on CPAP, TAA and aortic root dilatation. He is not anticoagulated for A.flutter due to this being a transient problem ~2015.  Patient was last seen by Dr. Diona Browner on 01/23/2023, stable at that time, no changes to management.  He was also cleared for reverse shoulder arthroplasty which he did subsequently undergo on 03/30/2023 without complication.  He had an incidental finding of right lower lobe pulmonary nodule noted on a CT chest for surveillance of aortic aneurysm.  A right lower lobe pulmonary nodule  increased in size over 6 months between May and November 2024.  CT scan of the chest 05/18/2023 shows a 3.3 x 1.2 right lower lobe pulmonary nodule increase in size compared with May.  PET scan 08/14/2023 showed no evidence of hypermetabolic mediastinal or hilar adenopathy.  The medial right lower lobe pulmonary nodule is 3.3 x 1.2 cm  with no significant hypermetabolic activity.  There is no distant hypermetabolic activity.  Dr. Delton Coombes recommended bronchoscopy with biopsies.  Other pertinent history includes GERD/PUD with hiatal hernia.  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  Patient will need day of surgery labs and evaluation.  EKG 01/23/2023: Sinus rhythm with sinus arrhythmia with 1st degree A-V block. Rate 63. Septal infarct.   TTE 09/20/2021: 1. Left ventricular ejection fraction, by estimation, is 55 to 60%. The  left ventricle has normal function. Left ventricular endocardial border  not optimally defined to evaluate regional wall motion. There is mild left  ventricular hypertrophy. Left  ventricular diastolic parameters are consistent with Grade I diastolic  dysfunction (impaired relaxation).  2. Right ventricular systolic function was not well visualized. The right  ventricular size is not well visualized. Tricuspid regurgitation signal is  inadequate for assessing PA pressure.  3. The mitral valve is abnormal. Trivial mitral valve regurgitation. No  evidence of mitral stenosis.  4. The aortic valve is tricuspid. There is moderate calcification of the  aortic valve. There is moderate thickening of the aortic valve. Aortic  valve regurgitation is mild. Mild aortic valve stenosis. Aortic valve mean  gradient measures 10.8 mmHg.  Aortic valve peak gradient measures 17.7 mmHg. Aortic valve area, by VTI  measures 2.12 cm.  5. Aortic dilatation noted. There is mild dilatation of the aortic root,  measuring 41 mm. There is moderate dilatation of the ascending aorta,  measuring 44 mm.  6. The inferior vena cava is normal in size with greater than 50%  respiratory variability, suggesting right atrial pressure of 3 mmHg.    Comparison(s): LVEF 55-60%, Aortic Root 4.1 cm.   Nuclear stress 10/16/2019:  No diagnostic ST segment changes to indicate ischemia.  Small size, moderate intensity, partially reversible defect in the mid to apical inferior and predominantly mid inferolateral wall. This is consistent with scar and a mild region of peri-infarct ischemia.  This is a low risk study.  Nuclear stress EF: 57%.    )        Anesthesia Quick Evaluation

## 2023-09-10 NOTE — Progress Notes (Signed)
 PCP - Dr. Quintin Alto Cardiologist - Dr. Nona Dell  PPM/ICD - denies   Chest x-ray - 02/08/15 EKG - 01/23/23 Stress Test - 10/16/19 ECHO - 09/20/21 Cardiac Cath - 12/26/13  CPAP - OSA+, CPAP nightly, pt unsure of pressure settings  DM- denies  Blood Thinner Instructions: n/a Aspirin Instructions: Hold 2 days. Last dose 3/8.  ERAS Protcol - no, NPO  COVID TEST- n/a  Anesthesia review: yes, cardiac hx  Patient verbally denies any shortness of breath, fever, cough and chest pain during phone call       Questions were answered. Patient verbalized understanding of instructions.

## 2023-09-11 ENCOUNTER — Ambulatory Visit (HOSPITAL_COMMUNITY): Admitting: Physician Assistant

## 2023-09-11 ENCOUNTER — Encounter (HOSPITAL_COMMUNITY)
Admission: RE | Disposition: A | Discharge status: Home / Self Care | Payer: Self-pay | Source: Home / Self Care | Attending: Emergency Medicine

## 2023-09-11 ENCOUNTER — Encounter (HOSPITAL_COMMUNITY): Payer: Self-pay | Admitting: Emergency Medicine

## 2023-09-11 ENCOUNTER — Ambulatory Visit (HOSPITAL_COMMUNITY)

## 2023-09-11 ENCOUNTER — Ambulatory Visit (HOSPITAL_COMMUNITY)
Admission: RE | Admit: 2023-09-11 | Discharge: 2023-09-11 | Disposition: A | Discharge status: Home / Self Care | Payer: Medicare Other | Attending: Emergency Medicine | Admitting: Emergency Medicine

## 2023-09-11 ENCOUNTER — Ambulatory Visit (HOSPITAL_BASED_OUTPATIENT_CLINIC_OR_DEPARTMENT_OTHER): Admitting: Physician Assistant

## 2023-09-11 DIAGNOSIS — K449 Diaphragmatic hernia without obstruction or gangrene: Secondary | ICD-10-CM | POA: Insufficient documentation

## 2023-09-11 DIAGNOSIS — Z96619 Presence of unspecified artificial shoulder joint: Secondary | ICD-10-CM | POA: Diagnosis not present

## 2023-09-11 DIAGNOSIS — I10 Essential (primary) hypertension: Secondary | ICD-10-CM | POA: Insufficient documentation

## 2023-09-11 DIAGNOSIS — I77819 Aortic ectasia, unspecified site: Secondary | ICD-10-CM | POA: Diagnosis not present

## 2023-09-11 DIAGNOSIS — Z8711 Personal history of peptic ulcer disease: Secondary | ICD-10-CM | POA: Diagnosis not present

## 2023-09-11 DIAGNOSIS — K219 Gastro-esophageal reflux disease without esophagitis: Secondary | ICD-10-CM | POA: Diagnosis not present

## 2023-09-11 DIAGNOSIS — I252 Old myocardial infarction: Secondary | ICD-10-CM | POA: Insufficient documentation

## 2023-09-11 DIAGNOSIS — I2510 Atherosclerotic heart disease of native coronary artery without angina pectoris: Secondary | ICD-10-CM | POA: Diagnosis not present

## 2023-09-11 DIAGNOSIS — I517 Cardiomegaly: Secondary | ICD-10-CM | POA: Diagnosis not present

## 2023-09-11 DIAGNOSIS — Z951 Presence of aortocoronary bypass graft: Secondary | ICD-10-CM | POA: Insufficient documentation

## 2023-09-11 DIAGNOSIS — G4733 Obstructive sleep apnea (adult) (pediatric): Secondary | ICD-10-CM | POA: Insufficient documentation

## 2023-09-11 DIAGNOSIS — Z955 Presence of coronary angioplasty implant and graft: Secondary | ICD-10-CM | POA: Diagnosis not present

## 2023-09-11 DIAGNOSIS — Z7985 Long-term (current) use of injectable non-insulin antidiabetic drugs: Secondary | ICD-10-CM | POA: Insufficient documentation

## 2023-09-11 DIAGNOSIS — R911 Solitary pulmonary nodule: Secondary | ICD-10-CM | POA: Diagnosis present

## 2023-09-11 DIAGNOSIS — C3431 Malignant neoplasm of lower lobe, right bronchus or lung: Secondary | ICD-10-CM | POA: Diagnosis not present

## 2023-09-11 DIAGNOSIS — Z79899 Other long term (current) drug therapy: Secondary | ICD-10-CM | POA: Diagnosis not present

## 2023-09-11 DIAGNOSIS — I480 Paroxysmal atrial fibrillation: Secondary | ICD-10-CM | POA: Diagnosis not present

## 2023-09-11 DIAGNOSIS — Z48813 Encounter for surgical aftercare following surgery on the respiratory system: Secondary | ICD-10-CM | POA: Diagnosis not present

## 2023-09-11 HISTORY — PX: FUDUCIAL PLACEMENT: SHX5083

## 2023-09-11 HISTORY — PX: BRONCHIAL BIOPSY: SHX5109

## 2023-09-11 HISTORY — PX: BRONCHIAL BRUSHINGS: SHX5108

## 2023-09-11 HISTORY — PX: BRONCHIAL NEEDLE ASPIRATION BIOPSY: SHX5106

## 2023-09-11 LAB — CBC
HCT: 40.4 % (ref 39.0–52.0)
Hemoglobin: 13.7 g/dL (ref 13.0–17.0)
MCH: 31 pg (ref 26.0–34.0)
MCHC: 33.9 g/dL (ref 30.0–36.0)
MCV: 91.4 fL (ref 80.0–100.0)
Platelets: 174 10*3/uL (ref 150–400)
RBC: 4.42 MIL/uL (ref 4.22–5.81)
RDW: 13.6 % (ref 11.5–15.5)
WBC: 5.2 10*3/uL (ref 4.0–10.5)
nRBC: 0 % (ref 0.0–0.2)

## 2023-09-11 LAB — BASIC METABOLIC PANEL
Anion gap: 8 (ref 5–15)
BUN: 17 mg/dL (ref 8–23)
CO2: 24 mmol/L (ref 22–32)
Calcium: 8.8 mg/dL — ABNORMAL LOW (ref 8.9–10.3)
Chloride: 107 mmol/L (ref 98–111)
Creatinine, Ser: 0.98 mg/dL (ref 0.61–1.24)
GFR, Estimated: >60 mL/min (ref >60)
Glucose, Bld: 87 mg/dL (ref 70–99)
Potassium: 3.8 mmol/L (ref 3.5–5.1)
Sodium: 139 mmol/L (ref 135–145)

## 2023-09-11 SURGERY — BRONCHOSCOPY, WITH BIOPSY USING ELECTROMAGNETIC NAVIGATION
Anesthesia: General | Laterality: Right

## 2023-09-11 MED ORDER — OXYCODONE HCL 5 MG/5ML PO SOLN: 5.0000 mg | Freq: Once | ORAL | Status: DC | PRN

## 2023-09-11 MED ORDER — MEPERIDINE HCL 25 MG/ML IJ SOLN: 6.2500 mg | INTRAMUSCULAR | Status: DC | PRN

## 2023-09-11 MED ORDER — FENTANYL CITRATE (PF) 250 MCG/5ML IJ SOLN
INTRAMUSCULAR | Status: DC | PRN
  Administered 2023-09-11: 100 ug via INTRAVENOUS

## 2023-09-11 MED ORDER — FENTANYL CITRATE (PF) 100 MCG/2ML IJ SOLN: 25.0000 ug | INTRAMUSCULAR | Status: DC | PRN

## 2023-09-11 MED ORDER — ALBUTEROL SULFATE HFA 108 (90 BASE) MCG/ACT IN AERS
INHALATION_SPRAY | RESPIRATORY_TRACT | Status: DC | PRN
  Administered 2023-09-11: 5 via RESPIRATORY_TRACT

## 2023-09-11 MED ORDER — EPHEDRINE SULFATE-NACL 50-0.9 MG/10ML-% IV SOSY
PREFILLED_SYRINGE | INTRAVENOUS | Status: DC | PRN
  Administered 2023-09-11 (×2): 7.5 mg via INTRAVENOUS

## 2023-09-11 MED ORDER — PHENYLEPHRINE HCL (PRESSORS) 10 MG/ML IV SOLN
INTRAVENOUS | Status: DC | PRN
Start: 2023-09-11 — End: 2023-09-11
  Administered 2023-09-11: 80 ug via INTRAVENOUS
  Administered 2023-09-11: 160 ug via INTRAVENOUS
  Administered 2023-09-11: 80 ug via INTRAVENOUS
  Administered 2023-09-11: 160 ug via INTRAVENOUS

## 2023-09-11 MED ORDER — OXYCODONE HCL 5 MG PO TABS: 5.0000 mg | ORAL_TABLET | Freq: Once | ORAL | Status: DC | PRN

## 2023-09-11 MED ORDER — MIDAZOLAM HCL 2 MG/2ML IJ SOLN
INTRAMUSCULAR | Status: DC | PRN
  Administered 2023-09-11: 2 mg via INTRAVENOUS

## 2023-09-11 MED ORDER — ONDANSETRON HCL 4 MG/2ML IJ SOLN
INTRAMUSCULAR | Status: DC | PRN
  Administered 2023-09-11: 4 mg via INTRAVENOUS

## 2023-09-11 MED ORDER — LACTATED RINGERS IV SOLN: INTRAVENOUS | Status: DC

## 2023-09-11 MED ORDER — SUGAMMADEX SODIUM 200 MG/2ML IV SOLN
INTRAVENOUS | Status: DC | PRN
  Administered 2023-09-11: 400 mg via INTRAVENOUS

## 2023-09-11 MED ORDER — ROCURONIUM BROMIDE 10 MG/ML (PF) SYRINGE
PREFILLED_SYRINGE | INTRAVENOUS | Status: DC | PRN
  Administered 2023-09-11: 70 mg via INTRAVENOUS
  Administered 2023-09-11: 30 mg via INTRAVENOUS

## 2023-09-11 MED ORDER — PROPOFOL 10 MG/ML IV BOLUS
INTRAVENOUS | Status: DC | PRN
  Administered 2023-09-11: 50 mg via INTRAVENOUS
  Administered 2023-09-11: 175 ug/kg/min via INTRAVENOUS
  Administered 2023-09-11: 150 mg via INTRAVENOUS
  Administered 2023-09-11: 150 ug/kg/min via INTRAVENOUS

## 2023-09-11 MED ORDER — DEXAMETHASONE SODIUM PHOSPHATE 10 MG/ML IJ SOLN
INTRAMUSCULAR | Status: DC | PRN
  Administered 2023-09-11: 10 mg via INTRAVENOUS

## 2023-09-11 MED ORDER — LIDOCAINE 2% (20 MG/ML) 5 ML SYRINGE
INTRAMUSCULAR | Status: DC | PRN
  Administered 2023-09-11: 40 mg via INTRAVENOUS
  Administered 2023-09-11: 60 mg via INTRAVENOUS

## 2023-09-11 MED ORDER — METOPROLOL TARTRATE 50 MG PO TABS: 50.0000 mg | ORAL_TABLET | Freq: Once | ORAL | Status: AC

## 2023-09-11 MED ORDER — MIDAZOLAM HCL 2 MG/2ML IJ SOLN: 0.5000 mg | Freq: Once | INTRAMUSCULAR | Status: DC | PRN

## 2023-09-11 MED ORDER — ATORVASTATIN CALCIUM 20 MG PO TABS: 20.0000 mg | ORAL_TABLET | Freq: Every evening | ORAL | Status: AC

## 2023-09-11 MED ORDER — ACETAMINOPHEN 500 MG PO TABS
1000.0000 mg | ORAL_TABLET | Freq: Once | ORAL | Status: AC
  Administered 2023-09-11: 1000 mg via ORAL
  Filled 2023-09-11: qty 2

## 2023-09-11 MED ORDER — METOPROLOL TARTRATE 50 MG PO TABS
ORAL_TABLET | ORAL | Status: AC
  Administered 2023-09-11: 50 mg via ORAL
  Filled 2023-09-11: qty 1

## 2023-09-11 SURGICAL SUPPLY — 1 items: superlock fiducial marker IMPLANT

## 2023-09-11 NOTE — Interval H&P Note (Signed)
 History and Physical Interval Note:  09/11/2023 7:23 AM  [name]  has presented today for surgery, with the diagnosis of RIGHT LOWER LOBE NODULE.  The various methods of treatment have been discussed with the patient and family. After consideration of risks, benefits and other options for treatment, the patient has consented to  Procedure(s): ROBOTIC ASSISTED NAVIGATIONAL BRONCHOSCOPY WITH FLUORO (Right) as a surgical intervention.  The patient's history has been reviewed, patient examined, no change in status, stable for surgery.  I have reviewed the patient's chart and labs.  Questions were answered to the patient's satisfaction.     Leslye Peer

## 2023-09-11 NOTE — Op Note (Signed)
 Video Bronchoscopy with Robotic Assisted Bronchoscopic Navigation   Date of Operation: 09/11/2023   Pre-op Diagnosis: Right lower lobe nodule  Post-op Diagnosis: Same  Surgeon: Levy Pupa  Assistants: None  Anesthesia: General endotracheal anesthesia  Operation: Flexible video fiberoptic bronchoscopy with robotic assistance and biopsies.  Estimated Blood Loss: Minimal  Complications: None  Indications and History: [name] is a 73 y.o. male never smoker with history of CAD, A-fib, GERD/hiatal hernia, OSA on CPAP.  Found to have a right lower lobe pulmonary nodule found incidentally on surveillance for aortic aneurysm.  There was enlargement on serial imaging.  PET scan negative.  Prompted further evaluation.  Recommendation made to achieve a tissue diagnosis via robotic assisted navigational bronchoscopy. The risks, benefits, complications, treatment options and expected outcomes were discussed with the patient.  The possibilities of pneumothorax, pneumonia, reaction to medication, pulmonary aspiration, perforation of a viscus, bleeding, failure to diagnose a condition and creating a complication requiring transfusion or operation were discussed with the patient who freely signed the consent.    Description of Procedure: The patient was seen in the Preoperative Area, was examined and was deemed appropriate to proceed.  The patient was taken to [facility] endoscopy room 3, identified as Linnell Fulling and the procedure verified as Flexible Video Fiberoptic Bronchoscopy.  A Time Out was held and the above information confirmed.   Prior to the date of the procedure a high-resolution CT scan of the chest was performed. Utilizing ION software program a virtual tracheobronchial tree was generated to allow the creation of distinct navigation pathways to the patient's parenchymal abnormalities. After being taken to the operating room general anesthesia was initiated and the patient  was orally  intubated. The video fiberoptic bronchoscope was introduced via the endotracheal tube and a general inspection was performed which showed normal right and left lung anatomy. Aspiration of the bilateral mainstems was completed to remove any remaining secretions. Robotic catheter inserted into patient's endotracheal tube.   Target #1 right lower lobe nodule: The distinct navigation pathways prepared prior to this procedure were then utilized to navigate to patient's lesion identified on CT scan. The robotic catheter was secured into place and the vision probe was withdrawn.  Lesion location was approximated using fluoroscopy.  Local registration and targeting was performed using Cios three-dimensional imaging. Under fluoroscopic guidance transbronchial brushings, transbronchial needle biopsies, and transbronchial forceps biopsies were performed to be sent for cytology and pathology.  Under fluoroscopic guidance a single fiducial marker was placed adjacent to the nodule.  At the end of the procedure a general airway inspection was performed and there was no evidence of active bleeding. The bronchoscope was removed.  The patient tolerated the procedure well. There was no significant blood loss and there were no obvious complications. A post-procedural chest x-ray is pending.  Samples Target #1: 1. Transbronchial brushings from right lower lobe nodule 2. Transbronchial Wang needle biopsies from right lower lobe nodule 3. Transbronchial forceps biopsies from right lower lobe nodule   Plans:  The patient will be discharged from the PACU to home when recovered from anesthesia and after chest x-ray is reviewed. We will review the cytology, pathology and microbiology results with the patient when they become available. Outpatient followup will be with Saralyn Pilar, NP and Dr. Delton Coombes.   Levy Pupa, MD, PhD 09/11/2023, 8:25 AM  Pulmonary and Critical Care [phone] or if no answer before 7:00PM call  [phone] For any issues after 7:00PM please call eLink [phone]

## 2023-09-11 NOTE — Transfer of Care (Signed)
 Immediate Anesthesia Transfer of Care Note  Patient: [name]  Procedure(s) Performed: ROBOTIC ASSISTED NAVIGATIONAL BRONCHOSCOPY WITH FLUORO (Right) BRONCHOSCOPY, WITH BIOPSY BRONCHOSCOPY, WITH NEEDLE ASPIRATION BIOPSY BRONCHOSCOPY, WITH BRUSH BIOPSY INSERTION, FIDUCIAL MARKER, GOLD  Patient Location: PACU  Anesthesia Type:General  Level of Consciousness: awake, alert , and oriented  Airway & Oxygen Therapy: Patient Spontanous Breathing and Patient connected to face mask oxygen  Post-op Assessment: Report given to RN and Post -op Vital signs reviewed and stable  Post vital signs: Reviewed and stable  Last Vitals:  Vitals Value Taken Time  BP 94/57 09/11/23 0845  Temp 36.6 C 09/11/23 0838  Pulse 68 09/11/23 0846  Resp 21 09/11/23 0846  SpO2 93 % 09/11/23 0846  Vitals shown include unfiled device data.  Last Pain:  Vitals:   09/11/23 0838  TempSrc:   PainSc: 0-No pain         Complications: No notable events documented.

## 2023-09-11 NOTE — Anesthesia Procedure Notes (Signed)
 Procedure Name: Intubation Date/Time: 09/11/2023 7:40 AM  Performed by: Jimmey Ralph, CRNAPre-anesthesia Checklist: Patient identified, Emergency Drugs available, Suction available and Patient being monitored Patient Re-evaluated:Patient Re-evaluated prior to induction Oxygen Delivery Method: Circle system utilized Preoxygenation: Pre-oxygenation with 100% oxygen Induction Type: IV induction Ventilation: Two handed mask ventilation required and Oral airway inserted - appropriate to patient size Laryngoscope Size: 4 and Mac Grade View: Grade I Tube type: Oral Tube size: 8.5 mm Number of attempts: 1 Airway Equipment and Method: Stylet and Oral airway Placement Confirmation: ETT inserted through vocal cords under direct vision, positive ETCO2 and breath sounds checked- equal and bilateral Secured at: 22 cm Tube secured with: Tape Dental Injury: Teeth and Oropharynx as per pre-operative assessment

## 2023-09-11 NOTE — Discharge Instructions (Addendum)
 Flexible Bronchoscopy, Care After This sheet gives you information about how to care for yourself after your test. Your doctor may also give you more specific instructions. If you have problems or questions, contact your doctor. Follow these instructions at home: Eating and drinking When your numbness is gone and your cough and gag reflexes have come back, you may: Eat only soft foods. Slowly drink liquids. When you get home after the test, go back to your normal diet. Driving Do not drive for 24 hours if you were given a medicine to help you relax (sedative). Do not drive or use heavy machinery while taking prescription pain medicine. General instructions  Take over-the-counter and prescription medicines only as told by your doctor. Return to your normal activities as told. Ask what activities are safe for you. Do not use any products that have nicotine or tobacco in them. This includes cigarettes and e-cigarettes. If you need help quitting, ask your doctor. Keep all follow-up visits as told by your doctor. This is important. It is very important if you had a tissue sample (biopsy) taken. Get help right away if: You have shortness of breath that gets worse. You get light-headed. You feel like you are going to pass out (faint). You have chest pain. You cough up: More than a little blood. More blood than before. Summary Do not eat or drink anything (not even water) for 2 hours after your test, or until your numbing medicine wears off. Do not use cigarettes. Do not use e-cigarettes. Get help right away if you have chest pain.  Please call our office for any questions concerns.  [phone]. Okay to restart your aspirin on 09/12/2023 Okay to restart your [facility] per your usual schedule  This information is not intended to replace advice given to you by your health care provider. Make sure you discuss any questions you have with your health care provider. Document Released: 04/16/2009  Document Revised: 06/01/2017 Document Reviewed: 07/07/2016 Elsevier Patient Education  2020 [employer].

## 2023-09-11 NOTE — Anesthesia Postprocedure Evaluation (Signed)
 Anesthesia Post Note  Patient: [name]  Procedure(s) Performed: ROBOTIC ASSISTED NAVIGATIONAL BRONCHOSCOPY WITH FLUORO (Right) BRONCHOSCOPY, WITH BIOPSY BRONCHOSCOPY, WITH NEEDLE ASPIRATION BIOPSY BRONCHOSCOPY, WITH BRUSH BIOPSY INSERTION, FIDUCIAL MARKER, GOLD     Patient location during evaluation: PACU Anesthesia Type: General Level of consciousness: awake and alert, patient cooperative and oriented Pain management: pain level controlled Vital Signs Assessment: post-procedure vital signs reviewed and stable Respiratory status: spontaneous breathing, nonlabored ventilation and respiratory function stable Cardiovascular status: blood pressure returned to baseline and stable Postop Assessment: no apparent nausea or vomiting and able to ambulate Anesthetic complications: no   No notable events documented.  Last Vitals:  Vitals:   09/11/23 0900 09/11/23 0915  BP: 107/62 111/70  Pulse: 64 61  Resp: 19 16  Temp:  36.6 C  SpO2: 94% 96%    Last Pain:  Vitals:   09/11/23 0838  TempSrc:   PainSc: 0-No pain                 Lex Linhares,E. Bridget Westbrooks

## 2023-09-13 ENCOUNTER — Telehealth: Payer: Self-pay | Admitting: Emergency Medicine

## 2023-09-13 ENCOUNTER — Encounter (HOSPITAL_COMMUNITY): Payer: Self-pay | Admitting: Emergency Medicine

## 2023-09-13 DIAGNOSIS — C3491 Malignant neoplasm of unspecified part of right bronchus or lung: Secondary | ICD-10-CM

## 2023-09-13 LAB — CYTOLOGY - NON PAP

## 2023-09-13 NOTE — Telephone Encounter (Signed)
 I reviewed bronchoscopy results with the patient by phone.  He is right lower lobe opacity was consistent with an adenocarcinoma.  I will refer him to see oncology in [place].  I suspect he will elect to do SBRT but he may be a surgical candidate.  His pulmonary function testing was over 10 years ago but it did look good.  We would need to repeat it if he wanted to consider surgery.

## 2023-09-17 ENCOUNTER — Encounter: Payer: Self-pay | Admitting: Emergency Medicine

## 2023-09-20 ENCOUNTER — Other Ambulatory Visit: Payer: Self-pay

## 2023-09-20 NOTE — Progress Notes (Signed)
 The proposed treatment discussed in conference is for discussion purpose only and is not a binding recommendation.  The patients have not been physically examined, or presented with their treatment options.  Therefore, final treatment plans cannot be decided.

## 2023-09-21 ENCOUNTER — Other Ambulatory Visit: Payer: Self-pay | Admitting: Physician Assistant

## 2023-09-21 DIAGNOSIS — C3491 Malignant neoplasm of unspecified part of right bronchus or lung: Secondary | ICD-10-CM

## 2023-09-21 NOTE — Progress Notes (Signed)
 [facility] [phone]   [phone]) [phone]  CONSULT NOTE  REFERRING PHYSICIAN: Dr. Delton Coombes   REASON FOR CONSULTATION:  Non-small cell lung cancer, adenocarcinoma  HPI [name] is a 73 y.o. [name] with a past medical history significant for atrial flutter, hypertension, coronary artery disease, hyperlipidemia, obesity, and CABG referred to the clinic for newly diagnosed non-small cell lung cancer, adenocarcinoma.  An incidental pulmonary nodule was found on imaging for surveillance of aneurysm. The patient states he is followed by cardiology. His most recent echo was on 09/20/21. Per pulmonary's note, the nodular area in the posteromedial right lower lobe had increased in size since prior study (6 month ago), now measuring 3.3 x 1.2 cm.   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 Imaging shows bilateral calcified pleural plaques consistent with asbestos exposure, though these are not related to the current nodule of concern.   She was seen by pulmonary medicine who recommended PET scan. The PET scan was performed on 08/14/2023 which showed continued growth of the nodule and is increasingly solid but it did not have any significant hypermetabolic activity but remained suspicious for adenocarcinoma.  There is no hypermetabolic activity in the neck, chest, abdomen, or pelvis.  Dr. Delton Coombes arrange for bronchoscopy on 09/11/2023. The final pathology ([id number]) of the right lower lobe FNA showed adenocarcinoma.  His case was discussed at the multidisciplinary conference on 09/20/2023.  The recommendation was to perform PFTs, complete the staging workup with brain MRI, and see if he is a candidate for surgery.  Based on location, it was felt that surgery would be a better option than radiation.   He is scheduled to follow up with pulmonary medicine on 09/26/23. He did  well with the bronchoscopy and denies any complications. He is wondering if he still needs this appointment.   Overall, the patient states he feels "good" today.  He denies any fever, chills, night sweats, or unexplained weight loss.  He reports his breathing is "good".  He may have some mild dyspnea on exertion with certain activities such as climbing stairs which is not any different than his baseline which he attributes to being out of shape.  He denies any cough, chest pain, or hemoptysis.  He denies any nausea or vomiting.  He sometimes has constipation secondary to medications.  He denies any diarrhea.  Denies any headache or visual changes.  His mother had bladder cancer.  His father passed away due to metastatic malignancy which sounds like was of GI primary.  The patient worked in [employer] for most of his life.  He is accompanied by his wife today.  He is a never smoker.  He does show a drinking 5 ounces of whiskey daily as well as 2 beers.  He denies any drug use.    HPI  Past Medical History:  Diagnosis Date   Aortic root dilatation (HCC)    Arthritis    Coronary atherosclerosis of native coronary artery    Multiple stents - RCA/LAD/diagonal, overlapping DES LAD, stent thrombosis 2005, ultimately CABG   Essential hypertension    H/O hiatal hernia    History of GI bleed    On nonsteroidals   Hyperlipemia    Myocardial infarction [facility])    Anterior with LAD stent thrombosis 2005, 2004 and 2005    Obesity    Paroxysmal atrial flutter (HCC)  Sleep apnea    CPAP daily    Past Surgical History:  Procedure Laterality Date   APPENDECTOMY     BREATH TEK H PYLORI  06/11/2012   Procedure: BREATH TEK H PYLORI;  Surgeon: Kandis Cocking, MD;  Location: [facility];  Service: General;  Laterality: N/ASue Lush   BRONCHIAL BIOPSY  09/11/2023   Procedure: BRONCHOSCOPY, WITH BIOPSY;  Surgeon: Leslye Peer, MD;  Location: [facility];  Service: Pulmonary;;   BRONCHIAL BRUSHINGS   09/11/2023   Procedure: BRONCHOSCOPY, WITH BRUSH BIOPSY;  Surgeon: Leslye Peer, MD;  Location: [facility];  Service: Pulmonary;;   BRONCHIAL NEEDLE ASPIRATION BIOPSY  09/11/2023   Procedure: BRONCHOSCOPY, WITH NEEDLE ASPIRATION BIOPSY;  Surgeon: Leslye Peer, MD;  Location: [facility];  Service: Pulmonary;;   CARDIAC SURGERY     CARPAL TUNNEL RELEASE Right 12/02/2010   CARPAL TUNNEL RELEASE  06/06/2011   Procedure: CARPAL TUNNEL RELEASE;  Surgeon: Wyn Forster., MD;  Location: [facility];  Service: Orthopedics;  Laterality: Right;   CARPAL TUNNEL RELEASE Right 04/10/2013   Procedure: REVISION OF CARPAL TUNNEL RELEASE LIGAMENT RIGHT WRIST;  Surgeon: Wyn Forster., MD;  Location: [facility];  Service: Orthopedics;  Laterality: Right;   CERVICAL FUSION  03/03/2009   CHOLECYSTECTOMY     CORONARY ANGIOPLASTY WITH STENT PLACEMENT  [id number]   CORONARY ARTERY BYPASS GRAFT N/A 12/30/2013   Procedure: CORONARY ARTERY BYPASS GRAFTING (CABG) x three, using left internal mammary artery and right leg greater sapheneous vein harvested endoscopically - LIMA to LAD, SVG-D, SVG-OM1;  Surgeon: Kerin Perna, MD;  Location: [facility] OR;  Service: Open Heart Surgery;  Laterality: N/A;   ELBOW ARTHROSCOPY Right    FUDUCIAL PLACEMENT  09/11/2023   Procedure: INSERTION, FIDUCIAL MARKER, GOLD;  Surgeon: Leslye Peer, MD;  Location: [facility];  Service: Pulmonary;;   IRRIGATION AND DEBRIDEMENT SHOULDER Right 04/30/2020   Procedure: Right shoulder poly exchange and possible glensphere exchange and left shoulder injection;  Surgeon: Beverely Low, MD;  Location: [facility];  Service: Orthopedics;  Laterality: Right;   LAPAROSCOPIC GASTRIC BANDING  08/31/2012   left carpal tunnel release      LEFT HEART CATHETERIZATION WITH CORONARY ANGIOGRAM N/A 12/26/2013   Procedure: LEFT HEART CATHETERIZATION WITH CORONARY ANGIOGRAM;  Surgeon: Marykay Lex, MD;  Location: [facility] CATH  LAB;  Service: Cardiovascular;  Laterality: N/A;   REVERSE SHOULDER ARTHROPLASTY Right 01/30/2020   Procedure: REVERSE SHOULDER ARTHROPLASTY;  Surgeon: Beverely Low, MD;  Location: [facility];  Service: Orthopedics;  Laterality: Right;  interscalene block   REVERSE SHOULDER ARTHROPLASTY Left 03/30/2023   Procedure: REVERSE SHOULDER ARTHROPLASTY;  Surgeon: Beverely Low, MD;  Location: [facility];  Service: Orthopedics;  Laterality: Left;   SHOULDER ARTHROSCOPY Right 10/01/2009   TOTAL KNEE ARTHROPLASTY Right 07/03/2004   TOTAL KNEE ARTHROPLASTY Left 07/03/2005   ULNAR NERVE TRANSPOSITION  06/06/2011   Procedure: ULNAR NERVE DECOMPRESSION/TRANSPOSITION;  Surgeon: Wyn Forster., MD;  Location: [facility];  Service: Orthopedics;  Laterality: Right;  decompression ulnar nerve right cubital tunnel     Family History  Problem Relation Age of Onset   Colon cancer Father    Obesity Mother    Colon cancer Maternal Grandmother    Diabetes Maternal Grandfather     Social History Social History   Tobacco Use   Smoking status: Never    Passive exposure: Never   Smokeless tobacco: Former  Types: Dorna Bloom    Quit date: 07/03/2003  Vaping Use   Vaping status: Never Used  Substance Use Topics   Alcohol use: Yes    Alcohol/week: 1.0 standard drink of alcohol    Types: 1 Cans of beer per week    Comment: 4-5 beers daily   Drug use: No    Allergies  Allergen Reactions   Bee Venom    Other     Bee stings - lips and tongue swelling, throat tightens    Chlorhexidine Itching    Pt. States on last admission CHG wipes made him itch and it had to be washed off.  Uncertain if benadryl was given.    Current Outpatient Medications  Medication Sig Dispense Refill   aspirin 81 MG EC tablet Take 81 mg by mouth at bedtime.      atorvastatin (LIPITOR) 20 MG tablet Take 1 tablet (20 mg total) by mouth every evening.     b complex vitamins tablet 1 tablet daily. B12-B6-Folic Acid  (Sublingual)     Cholecalciferol (VITAMIN D) 125 MCG (5000 UT) CAPS Take 5,000 Units by mouth daily.      Coenzyme Q10 (CO Q-10) 100 MG CAPS Take 100 mg by mouth daily.      cyclobenzaprine (FLEXERIL) 10 MG tablet Take 1 tablet (10 mg total) by mouth 3 (three) times daily as needed for muscle spasms. 30 tablet 0   EPINEPHrine 0.3 mg/0.3 mL IJ SOAJ injection Inject 0.3 mLs into the skin as needed for anaphylaxis.      HYDROcodone-acetaminophen (NORCO) 5-325 MG tablet Take 1-2 tablets by mouth every 6 (six) hours as needed for moderate pain or severe pain. (Patient not taking: Reported on 08/30/2023) 30 tablet 0   Krill Oil 1000 MG CAPS Take 1,000 mg by mouth daily.     loratadine (CLARITIN) 10 MG tablet Take 10 mg by mouth daily as needed for allergies.     losartan (COZAAR) 100 MG tablet Take 100 mg by mouth daily.     LYCOPENE PO Take 1 tablet by mouth daily.     metoprolol (LOPRESSOR) 50 MG tablet Take 50 mg by mouth 2 (two) times daily.     MOUNJARO 15 MG/0.5ML Pen Inject 15 mg into the skin once a week. Mondays     Multiple Vitamins-Minerals (EYE VITAMINS PO) Take 1 capsule by mouth daily. [facility]     naproxen (NAPROSYN) 500 MG tablet Take 500 mg by mouth 2 (two) times daily with a meal.     nitroGLYCERIN (NITROSTAT) 0.4 MG SL tablet Place 0.4 mg under the tongue every 5 (five) minutes as needed for chest pain. Up to 3x     OLIVE LEAF EXTRACT PO Take 1 capsule by mouth daily. 20,000 mg     ondansetron (ZOFRAN) 4 MG tablet Take 1 tablet (4 mg total) by mouth every 8 (eight) hours as needed for vomiting, refractory nausea / vomiting or nausea. (Patient not taking: Reported on 08/30/2023) 30 tablet 1   Pomegranate, Punica granatum, (POMEGRANATE PO) Take 5,000 mg by mouth daily.     Tadalafil 2.5 MG TABS Take 2.5 mg by mouth every morning.     Turmeric 500 MG TABS Take 500 mg by mouth 2 (two) times daily.      No current facility-administered medications for this visit.    REVIEW OF  SYSTEMS:   Review of Systems  Constitutional: Negative for appetite change, chills, fatigue, fever and unexpected weight change.  HENT: Negative for  mouth sores, nosebleeds, sore throat and trouble swallowing.   Eyes: Negative for eye problems and icterus.  Respiratory: Mild dyspnea on exertion with certain strenuous activities such as climbing stairs. Negative for cough, hemoptysis, and wheezing.   Cardiovascular: Negative for chest pain and leg swelling.  Gastrointestinal: Negative for abdominal pain, constipation, diarrhea, nausea and vomiting.  Genitourinary: Negative for bladder incontinence, difficulty urinating, dysuria, frequency and hematuria.   Musculoskeletal: Negative for back pain, gait problem, neck pain and neck stiffness.  Skin: Negative for itching and rash.  Neurological: Negative for dizziness, extremity weakness, gait problem, headaches, light-headedness and seizures.  Hematological: Negative for adenopathy. Does not bruise/bleed easily.  Psychiatric/Behavioral: Negative for confusion, depression and sleep disturbance. The patient is not nervous/anxious.     PHYSICAL EXAMINATION:  Blood pressure 123/62, pulse 71, temperature 98 F (36.7 C), temperature source Temporal, resp. rate 18, height 5\' 6"  (1.676 m), weight 231 lb 11.2 oz (105.1 kg), SpO2 98%.  ECOG PERFORMANCE STATUS: 0  Physical Exam  Constitutional: Oriented to person, place, and time and well-developed, well-nourished, and in no distress.  HENT:  Head: Normocephalic and atraumatic.  Mouth/Throat: Oropharynx is clear and moist. No oropharyngeal exudate.  Eyes: Conjunctivae are normal. Right eye exhibits no discharge. Left eye exhibits no discharge. No scleral icterus.  Neck: Normal range of motion. Neck supple.  Cardiovascular: Normal rate, regular rhythm, murmur noted and intact distal pulses.   Pulmonary/Chest: Effort normal and breath sounds normal. No respiratory distress. No wheezes. No rales.   Abdominal: Soft. Bowel sounds are normal. Exhibits no distension and no mass. There is no tenderness.  Musculoskeletal: Normal range of motion. Exhibits no edema.  Lymphadenopathy:    No cervical adenopathy.  Neurological: Alert and oriented to person, place, and time. Exhibits normal muscle tone. Gait normal. Coordination normal.  Skin: Skin is warm and dry. No rash noted. Not diaphoretic. No erythema. No pallor.  Psychiatric: Mood, memory and judgment normal.  Vitals reviewed.  LABORATORY DATA: Lab Results  Component Value Date   WBC 7.7 09/24/2023   HGB 15.2 09/24/2023   HCT 44.5 09/24/2023   MCV 90.3 09/24/2023   PLT 218 09/24/2023      Chemistry      Component Value Date/Time   NA 136 09/24/2023 1314   K 4.2 09/24/2023 1314   CL 102 09/24/2023 1314   CO2 25 09/24/2023 1314   BUN 25 (H) 09/24/2023 1314   CREATININE 0.78 09/24/2023 1314      Component Value Date/Time   CALCIUM 10.2 09/24/2023 1314   ALKPHOS 49 09/24/2023 1314   AST 19 09/24/2023 1314   ALT 16 09/24/2023 1314   BILITOT 0.8 09/24/2023 1314       RADIOGRAPHIC STUDIES: CT Super D Chest Wo Contrast Result Date: 09/17/2023 CLINICAL DATA:  Enlarging pulmonary nodule of the right lower lobe * Tracking Code: BO * EXAM: CT CHEST WITHOUT CONTRAST TECHNIQUE: Multidetector CT imaging of the chest was performed using thin slice collimation for electromagnetic bronchoscopy planning purposes, without intravenous contrast. RADIATION DOSE REDUCTION: This exam was performed according to the departmental dose-optimization program which includes automated exposure control, adjustment of the mA and/or kV according to patient size and/or use of iterative reconstruction technique. COMPARISON:  PET-CT, 08/14/2023, CT chest, 05/18/2023 FINDINGS: Cardiovascular: Aortic atherosclerosis. Mild cardiomegaly. Three-vessel coronary artery calcifications and stents status post median sternotomy and CABG. No pericardial effusion.  Mediastinum/Nodes: No enlarged mediastinal, hilar, or axillary lymph nodes. Thyroid gland, trachea, and esophagus demonstrate no significant findings.  Lungs/Pleura: Redemonstrated mixed solid and ground-glass composition subpleural nodular opacity of the paramedian right lower lobe measuring 3.3 x 1.7 cm. This is clearly increased in size and solid character when compared to prior diagnostic chest CT dated 05/18/2023, perhaps slightly increased in size when compared to free breathing PET-CT examination dated 08/14/2023. Mild diffuse bilateral bronchial wall thickening. Scattered tiny calcified bilateral pleural plaques (series 7, image 230). No pleural effusion or pneumothorax. Upper Abdomen: No acute abnormality. Hepatic steatosis. Status post cholecystectomy. Adjustable gastric lap band with subcutaneous reservoir. Musculoskeletal: No chest wall abnormality. No acute osseous findings. 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 IMPRESSION: 1. Redemonstrated mixed solid and ground-glass composition subpleural nodular opacity of the paramedian right lower lobe measuring 3.3 x 1.7 cm. This is clearly increased in size and solid character when compared to prior diagnostic chest CT dated 05/18/2023, perhaps slightly increased in size when compared to free breathing PET-CT examination dated 08/14/2023. Despite lack of FDG avidity on prior examination, appearance and behavior are worrisome for adenocarcinoma. Infectious or inflammatory etiology does however remain a significant differential consideration. 2. No evidence of lymphadenopathy or metastatic disease in the chest. 3. Scattered tiny calcified bilateral pleural plaques, suggesting prior asbestos exposure, although conceivably related to prior hemothorax in the setting of median sternotomy. 4. Coronary artery disease status post median sternotomy and CABG. Chronic sternal nonunion and wire fractures. 5. Hepatic steatosis. Aortic  Atherosclerosis (ICD10-I70.0). Electronically Signed   By: Jearld Lesch M.D.   On: 09/17/2023 13:49   DG Chest Port 1 View Result Date: 09/11/2023 CLINICAL DATA:  Status post bronchoscopy and biopsy. EXAM: PORTABLE CHEST 1 VIEW COMPARISON:  Chest radiograph dated 03/06/2023 and CT dated 09/05/2023. FINDINGS: No focal consolidation, pleural effusion, or pneumothorax. Mild cardiomegaly. Median sternotomy wires. No acute osseous pathology. IMPRESSION: No pneumothorax. Electronically Signed   By: Elgie Collard M.D.   On: 09/11/2023 12:30   DG C-ARM BRONCHOSCOPY Result Date: 09/11/2023 C-ARM BRONCHOSCOPY: Fluoroscopy was utilized by the requesting physician.  No radiographic interpretation.    ASSESSMENT: This is a very pleasant [name] with stage IB (T2a, N0, M0) non-small cell lung cancer, adenocarcinoma.  The patient presented with a right lower lobe lung lesion.  He was diagnosed in March 2025.  The patient is a never smoker.   The patient's case was discussed at the multidisciplinary conference on 09/20/2023.  The patient was seen with Dr. Arbutus Ped today who discussed the recommendation.  The recommendation was to complete the staging workup with a brain MRI.  Recommendation also included surgical evaluation.  Therefore, we will help facilitate this with ordering PFTs and refer the patient to cardiothoracic surgery.  The patient may need to discuss cardiac clearance with his cardiologist prior to surgery if he is a candidate.  Will see the patient back after surgery.  However, if the patient is evaluated by surgery and is not a surgical candidate, then he was given our contact number to call us so we could arrange next steps sooner.  The patient lives in [place] and would be willing to have his PFTs or brain MRIs performed at [facility] if they have earlier availability.  He is eager to have his malignancy taken care of.  I reach out to pulmonary about his upcoming appointment on  09/26/2023.  Since the patient is doing well since his bronchoscopy, they will go ahead and cancel his follow-up appointment for later this week.  I sent the patient a MyChart  message notifying him as we discussed today.   The patient voices understanding of current disease status and treatment options and is in agreement with the current care plan.  All questions were answered. The patient knows to call the clinic with any problems, questions or concerns. We can certainly see the patient much sooner if necessary.  Thank you so much for allowing me to participate in the care of [facility]. I will continue to follow up the patient with you and assist in his care.   Disclaimer: This note was dictated with voice recognition software. Similar sounding words can inadvertently be transcribed and may not be corrected upon review.   Nicolas Banh L Keyden Pavlov September 24, 2023, 2:19 PM  ADDENDUM: Hematology/Oncology Attending: I had a face-to-face encounter with the patient today.  I reviewed his record, lab, scans and recommended his care plan.  This is a very pleasant Matthew years old white [name] with past medical history significant for hypertension, coronary artery disease, dyslipidemia, obesity, atrial flutter who was found to have incidental posterior medial right lower lobe nodule during routine surveillance for aneurysm by his cardiologist.  The nodule was followed by imaging study and it has been increasing in size.  He was referred to pulmonary medicine and a PET scan on August 14, 2023 showed the subpleural pulmonary nodule of concern inferior medially in the right lower lobe demonstrated continued growth and increase in the solid but demonstrated no significant hypermetabolic activity and the findings remain suspicious for possible slowly growing adenocarcinoma.  There was no hypermetabolic activity identified within the neck, chest, abdomen or pelvis and no evidence of metastatic disease.  On  September 11, 2023 the patient underwent video bronchoscopy with robotic assisted bronchoscopic navigation under the care of Dr. Delton Coombes.  The final pathology of the right lower lobe brushing and fine-needle aspiration was consistent with adenocarcinoma.  The patient was referred to me today for evaluation and recommendation regarding his condition. I had a lengthy discussion with the patient and his wife about his current condition and treatment options We discussed his condition at the weekly thoracic conference last week and recommendation is to evaluate his pulmonary function test and refer to cardiothoracic surgery for discussion of surgical resection if the patient is a candidate for this treatment. I discussed this recommendation with the patient and his wife and they are in agreement with the current plan. We will see him back for follow-up visit after his surgical resection or evaluation if he is not a surgical candidate. Will complete the staging workup by ordering MRI of the brain to rule out brain metastasis. The patient was advised to call immediately if he has any other concerning symptoms in the interval. The total time spent in the appointment was 60 minutes.  Disclaimer: This note was dictated with voice recognition software. Similar sounding words can inadvertently be transcribed and may be missed upon review. Lajuana Matte, MD

## 2023-09-24 ENCOUNTER — Inpatient Hospital Stay

## 2023-09-24 ENCOUNTER — Inpatient Hospital Stay: Attending: Physician Assistant | Admitting: Physician Assistant

## 2023-09-24 ENCOUNTER — Encounter: Payer: Self-pay | Admitting: Physician Assistant

## 2023-09-24 ENCOUNTER — Other Ambulatory Visit: Payer: Self-pay

## 2023-09-24 VITALS — BP 123/62 | HR 71 | Temp 98.0°F | Resp 18 | Ht 66.0 in | Wt 231.7 lb

## 2023-09-24 DIAGNOSIS — C3490 Malignant neoplasm of unspecified part of unspecified bronchus or lung: Secondary | ICD-10-CM | POA: Insufficient documentation

## 2023-09-24 DIAGNOSIS — Z8052 Family history of malignant neoplasm of bladder: Secondary | ICD-10-CM

## 2023-09-24 DIAGNOSIS — C3431 Malignant neoplasm of lower lobe, right bronchus or lung: Secondary | ICD-10-CM | POA: Insufficient documentation

## 2023-09-24 DIAGNOSIS — Z800 Family history of malignant neoplasm of digestive organs: Secondary | ICD-10-CM

## 2023-09-24 DIAGNOSIS — C3491 Malignant neoplasm of unspecified part of right bronchus or lung: Secondary | ICD-10-CM

## 2023-09-24 DIAGNOSIS — Z87891 Personal history of nicotine dependence: Secondary | ICD-10-CM | POA: Insufficient documentation

## 2023-09-24 LAB — CBC WITH DIFFERENTIAL (CANCER CENTER ONLY)
Abs Immature Granulocytes: 0.01 10*3/uL (ref 0.00–0.07)
Basophils Absolute: 0.1 10*3/uL (ref 0.0–0.1)
Basophils Relative: 1 %
Eosinophils Absolute: 0.2 10*3/uL (ref 0.0–0.5)
Eosinophils Relative: 3 %
HCT: 44.5 % (ref 39.0–52.0)
Hemoglobin: 15.2 g/dL (ref 13.0–17.0)
Immature Granulocytes: 0 %
Lymphocytes Relative: 25 %
Lymphs Abs: 1.9 10*3/uL (ref 0.7–4.0)
MCH: 30.8 pg (ref 26.0–34.0)
MCHC: 34.2 g/dL (ref 30.0–36.0)
MCV: 90.3 fL (ref 80.0–100.0)
Monocytes Absolute: 1.1 10*3/uL — ABNORMAL HIGH (ref 0.1–1.0)
Monocytes Relative: 15 %
Neutro Abs: 4.4 10*3/uL (ref 1.7–7.7)
Neutrophils Relative %: 56 %
Platelet Count: 218 10*3/uL (ref 150–400)
RBC: 4.93 MIL/uL (ref 4.22–5.81)
RDW: 13.6 % (ref 11.5–15.5)
WBC Count: 7.7 10*3/uL (ref 4.0–10.5)
nRBC: 0 % (ref 0.0–0.2)

## 2023-09-24 LAB — CMP (CANCER CENTER ONLY)
ALT: 16 U/L (ref 0–44)
AST: 19 U/L (ref 15–41)
Albumin: 4.4 g/dL (ref 3.5–5.0)
Alkaline Phosphatase: 49 U/L (ref 38–126)
Anion gap: 9 (ref 5–15)
BUN: 25 mg/dL — ABNORMAL HIGH (ref 8–23)
CO2: 25 mmol/L (ref 22–32)
Calcium: 10.2 mg/dL (ref 8.9–10.3)
Chloride: 102 mmol/L (ref 98–111)
Creatinine: 0.78 mg/dL (ref 0.61–1.24)
GFR, Estimated: >60 mL/min (ref >60)
Glucose, Bld: 93 mg/dL (ref 70–99)
Potassium: 4.2 mmol/L (ref 3.5–5.1)
Sodium: 136 mmol/L (ref 135–145)
Total Bilirubin: 0.8 mg/dL (ref 0.0–1.2)
Total Protein: 7.2 g/dL (ref 6.5–8.1)

## 2023-09-26 ENCOUNTER — Ambulatory Visit: Payer: Medicare Other | Admitting: Acute Care

## 2023-09-26 NOTE — Progress Notes (Signed)
 I called the pt to introduce myself as his lung navigator and explain my role in his cancer care. I was unable to make it to the pts consult appt on 3/24. I let him know that I had reached out to the respiratory therapists at [facility] to arrange his pulmonary function test. He is currently scheduled for 4/2 at 9am. Pt states that day and time will work for him. I provided him the instructions he has to follow prior to his PFTs--no caffeine, no smoking, and no inhalers. Pt verbalized understanding. I provided my pt with my direct number and encouraged him to call me with any questions or concerns.

## 2023-09-26 NOTE — Progress Notes (Signed)
 Email sent to [facility], Ruthell Rummage and Lorinda Creed, requesting pt be scheduled for PFTs.

## 2023-10-01 ENCOUNTER — Ambulatory Visit (HOSPITAL_COMMUNITY)
Admission: RE | Admit: 2023-10-01 | Discharge: 2023-10-01 | Disposition: A | Discharge status: Home / Self Care | Source: Ambulatory Visit | Attending: Physician Assistant | Admitting: Physician Assistant

## 2023-10-01 DIAGNOSIS — C3490 Malignant neoplasm of unspecified part of unspecified bronchus or lung: Secondary | ICD-10-CM | POA: Diagnosis not present

## 2023-10-01 DIAGNOSIS — C3491 Malignant neoplasm of unspecified part of right bronchus or lung: Secondary | ICD-10-CM | POA: Diagnosis not present

## 2023-10-01 MED ORDER — GADOBUTROL 1 MMOL/ML IV SOLN
10.0000 mL | Freq: Once | INTRAVENOUS | Status: AC | PRN
  Administered 2023-10-01: 10 mL via INTRAVENOUS

## 2023-10-02 ENCOUNTER — Telehealth: Payer: Self-pay | Admitting: Cardiology

## 2023-10-02 ENCOUNTER — Ambulatory Visit: Payer: Medicare Other | Attending: Cardiology | Admitting: Cardiology

## 2023-10-02 ENCOUNTER — Encounter: Payer: Self-pay | Admitting: Cardiology

## 2023-10-02 ENCOUNTER — Encounter: Payer: Self-pay | Admitting: *Deleted

## 2023-10-02 VITALS — BP 118/72 | HR 83 | Ht 66.0 in | Wt 236.8 lb

## 2023-10-02 DIAGNOSIS — I350 Nonrheumatic aortic (valve) stenosis: Secondary | ICD-10-CM | POA: Diagnosis not present

## 2023-10-02 DIAGNOSIS — I7121 Aneurysm of the ascending aorta, without rupture: Secondary | ICD-10-CM

## 2023-10-02 DIAGNOSIS — E782 Mixed hyperlipidemia: Secondary | ICD-10-CM | POA: Diagnosis not present

## 2023-10-02 DIAGNOSIS — I25119 Atherosclerotic heart disease of native coronary artery with unspecified angina pectoris: Secondary | ICD-10-CM | POA: Diagnosis not present

## 2023-10-02 DIAGNOSIS — Z01810 Encounter for preprocedural cardiovascular examination: Secondary | ICD-10-CM

## 2023-10-02 NOTE — Patient Instructions (Addendum)
 Medication Instructions:  Your physician recommends that you continue on your current medications as directed. Please refer to the Current Medication list given to you today.   Labwork: none  Testing/Procedures: Your physician has requested that you have an echocardiogram. Echocardiography is a painless test that uses sound waves to create images of your heart. It provides your doctor with information about the size and shape of your heart and how well your heart's chambers and valves are working. This procedure takes approximately one hour. There are no restrictions for this procedure. Please do NOT wear cologne, perfume, aftershave, or lotions (deodorant is allowed). Please arrive 15 minutes prior to your appointment time.  Please note: We ask at that you not bring children with you during ultrasound (echo/ vascular) testing. Due to room size and safety concerns, children are not allowed in the ultrasound rooms during exams. Our front office staff cannot provide observation of children in our lobby area while testing is being conducted. An adult accompanying a patient to their appointment will only be allowed in the ultrasound room at the discretion of the ultrasound technician under special circumstances. We apologize for any inconvenience. Your physician has requested that you have a lexiscan myoview. For further information please visit [email or web address] Please follow instruction sheet, as given.  Follow-Up: Your physician recommends that you schedule a follow-up appointment in: 6 months  Any Other Special Instructions Will Be Listed Below (If Applicable).  If you need a refill on your cardiac medications before your next appointment, please call your pharmacy.

## 2023-10-02 NOTE — Telephone Encounter (Signed)
 Advised patient that [name] scan was ordered. Patient informed and verbalized understanding of plan.

## 2023-10-02 NOTE — Telephone Encounter (Signed)
 Patient calling because he is unable to do the treadmill test, stated its suppose to be the medication test. Please advise

## 2023-10-02 NOTE — Progress Notes (Signed)
 Cardiology Office Note  Date: 10/02/2023   ID: WOODIE DEGRAFFENREID, DOB 04/10/1951, MRN [id number]  History of Present Illness: [name] is a 73 y.o. male last seen in July 2024.  He is here for a follow-up visit. Interval chart reviewed, he has been diagnosed with adenocarcinoma the lung by imaging and biopsy.  Further neuroimaging pending with treatment plan most likely to include surgery.  He does not report any substantial shortness of breath beyond NYHA class II, no cough or hemoptysis.  Also no definite angina or change in stamina.  Last ischemic testing was in 2021 and his last echocardiogram was in 2023.  We went over his medications today.  He reports compliance with his regimen.  Blood pressure is well-controlled today.  RCRI perioperative cardiac risk is intermediate based on available information, 2 points, 10.1% chance of major adverse cardiac event.  Physical Exam: VS:  BP 118/72   Pulse 83   Ht 5\' 6"  (1.676 m)   Wt 236 lb 12.8 oz (107.4 kg)   SpO2 97%   BMI 38.22 kg/m , BMI Body mass index is 38.22 kg/m.  Wt Readings from Last 3 Encounters:  10/02/23 236 lb 12.8 oz (107.4 kg)  09/24/23 231 lb 11.2 oz (105.1 kg)  09/11/23 232 lb (105.2 kg)    General: Patient appears comfortable at rest. HEENT: Conjunctiva and lids normal. Neck: Supple, no elevated JVP or carotid bruits. Lungs: Clear to auscultation, nonlabored breathing at rest. Cardiac: Regular rate and rhythm, no S3, 3/6 systolic murmur. Abdomen: Soft, bowel sounds present. Extremities: No pitting edema.  ECG:  An ECG dated 01/23/2023 was personally reviewed today and demonstrated:  Sinus rhythm with prolonged PR interval and old septal infarct pattern.  Labwork: March 2023: Cholesterol 148, triglycerides 68, HDL 56, LDL 78 09/24/2023: ALT 16; AST 19; BUN 25; Creatinine 0.78; Hemoglobin 15.2; Platelet Count 218; Potassium 4.2; Sodium 136   Other Studies Reviewed Today:  No interval cardiac testing  for review today.  Assessment and Plan:  1.  CAD status post multiple prior stent interventions to the RCA, LAD, and diagonal followed ultimately by CABG in 2015 including LIMA to LAD, SVG to diagonal, and SVG to OM1.  LVEF 55 to 60% by echocardiogram in March 2023.  He does not report any angina at this time on medical therapy.  Continue aspirin 81 mg daily, Lipitor 20 mg daily, and as needed nitroglycerin.  He is also on Mounjaro 15 mg / 0.5 mL weekly.   2.  Primary hypertension.  Blood pressure is well-controlled today.  Continue Cozaar 100 mg daily and Lopressor 50 mg daily.   3.  Mixed hyperlipidemia.  Continue Lipitor 20 mg daily.   4.  Mild degenerative, calcific aortic stenosis with mean AV gradient 11 mmHg by echocardiogram in March 2023.  Cardiac murmur somewhat more prominent.   5.  Ascending thoracic aortic dilatation, stable at 4.2 cm by chest CTA in May 2024.  6.  Preoperative cardiac evaluation.  Per chart patient diagnosed with adenocarcinoma of the lung and will likely undergo surgical resection.  RCRI perioperative cardiac risk index is intermediate, 2 points with 10.1% chance of major adverse cardiac event.  We are scheduling a Lexiscan Myoview to reassess ischemic burden and also a follow-up echocardiogram for reassessment of aortic stenosis, these will be scheduled in the near future.  Further recommendations to follow.  Disposition:  Follow up  6 months.  Signed, Jonelle Sidle, M.D., F.A.[facility]

## 2023-10-03 ENCOUNTER — Ambulatory Visit (HOSPITAL_COMMUNITY)
Admission: RE | Admit: 2023-10-03 | Discharge: 2023-10-03 | Disposition: A | Discharge status: Home / Self Care | Source: Ambulatory Visit | Attending: Physician Assistant | Admitting: Physician Assistant

## 2023-10-03 DIAGNOSIS — R0600 Dyspnea, unspecified: Secondary | ICD-10-CM | POA: Diagnosis not present

## 2023-10-03 DIAGNOSIS — C3491 Malignant neoplasm of unspecified part of right bronchus or lung: Secondary | ICD-10-CM | POA: Insufficient documentation

## 2023-10-03 LAB — PULMONARY FUNCTION TEST
DL/VA % pred: 105 %
DL/VA: 4.31 ml/min/mmHg/L
DLCO cor % pred: 92 %
DLCO cor: 20.67 ml/min/mmHg
DLCO unc % pred: 93 %
DLCO unc: 21.01 ml/min/mmHg
FEF 25-75 Post: 1.82 L/s
FEF 25-75 Pre: 1.98 L/s
FEF2575-%Change-Post: -7 %
FEF2575-%Pred-Post: 92 %
FEF2575-%Pred-Pre: 100 %
FEV1-%Change-Post: 0 %
FEV1-%Pred-Post: 83 %
FEV1-%Pred-Pre: 83 %
FEV1-Post: 2.21 L
FEV1-Pre: 2.2 L
FEV1FVC-%Change-Post: -2 %
FEV1FVC-%Pred-Pre: 109 %
FEV6-%Change-Post: 2 %
FEV6-%Pred-Post: 82 %
FEV6-%Pred-Pre: 80 %
FEV6-Post: 2.83 L
FEV6-Pre: 2.77 L
FEV6FVC-%Pred-Post: 107 %
FEV6FVC-%Pred-Pre: 107 %
FVC-%Change-Post: 2 %
FVC-%Pred-Post: 77 %
FVC-%Pred-Pre: 75 %
FVC-Post: 2.83 L
FVC-Pre: 2.77 L
Post FEV1/FVC ratio: 78 %
Post FEV6/FVC ratio: 100 %
Pre FEV1/FVC ratio: 80 %
Pre FEV6/FVC Ratio: 100 %
RV % pred: 75 %
RV: 1.73 L
TLC % pred: 78 %
TLC: 4.88 L

## 2023-10-03 MED ORDER — ALBUTEROL SULFATE (2.5 MG/3ML) 0.083% IN NEBU
2.5000 mg | INHALATION_SOLUTION | Freq: Once | RESPIRATORY_TRACT | Status: AC
  Administered 2023-10-03: 2.5 mg via RESPIRATORY_TRACT

## 2023-10-04 ENCOUNTER — Ambulatory Visit: Attending: Cardiology

## 2023-10-04 DIAGNOSIS — Z01810 Encounter for preprocedural cardiovascular examination: Secondary | ICD-10-CM

## 2023-10-04 DIAGNOSIS — I350 Nonrheumatic aortic (valve) stenosis: Secondary | ICD-10-CM

## 2023-10-04 NOTE — Progress Notes (Unsigned)
 [address]       [place] [address]             [phone]                    [name] Medical Record #[id number] Date of Birth: August 11, 1950  Referring: [name]* Primary Care: Matthew Alcide, MD Primary Cardiologist: Matthew Dell, MD  Chief Complaint:   No chief complaint on file.   History of Present Illness:    [name] 73 y.o. male presents for surgical evaluation of a ***  wight [name] is a 73 y.o. male with a past medical history significant for atrial flutter, hypertension, coronary artery disease, hyperlipidemia, obesity, and CABG referred to the clinic for newly diagnosed non-small cell lung cancer, adenocarcinoma.   An incidental pulmonary nodule was found on imaging for surveillance of aneurysm. The patient states he is followed by cardiology. His most recent echo was on 09/20/21. Per pulmonary's note, the nodular area in the posteromedial right lower lobe had increased in size since prior study (6 month ago), now measuring 3.3 x 1.2 cm.    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 Imaging shows bilateral calcified pleural plaques consistent with asbestos exposure, though these are not related to the current nodule of concern.    She was seen by pulmonary medicine who recommended PET scan. The PET scan was performed on 08/14/2023 which showed continued growth of the nodule and is increasingly solid but it did not have any significant hypermetabolic activity but remained suspicious for adenocarcinoma.  There is no hypermetabolic activity in the neck, chest, abdomen, or pelvis.   Dr. Delton [name] arrange for bronchoscopy on 09/11/2023. The final pathology ([id number]) of the right lower lobe FNA showed adenocarcinoma.   His case was discussed at the multidisciplinary conference on 09/20/2023.  The recommendation  was to perform PFTs, complete the staging workup with brain MRI, and see if he is a candidate for surgery.  Based on location, it was felt that surgery would be a better option than radiation.   Smoking Hx: ***      Past Medical History:  Diagnosis Date   Aortic root dilatation (HCC)    Arthritis    Coronary atherosclerosis of native coronary artery    Multiple stents - RCA/LAD/diagonal, overlapping DES LAD, stent thrombosis 2005, ultimately CABG   Essential hypertension    H/O hiatal hernia    History of GI bleed    On nonsteroidals   Hyperlipemia    Myocardial infarction [facility])    Anterior with LAD stent thrombosis 2005, 2004 and 2005    Obesity    Paroxysmal atrial flutter (HCC)    Sleep apnea    CPAP daily    Past Surgical History:  Procedure Laterality Date   APPENDECTOMY     BREATH TEK H PYLORI  06/11/2012   Procedure: BREATH TEK H PYLORI;  Surgeon: Matthew Cocking, MD;  Location: [facility];  Service: General;  Laterality: N/ASue Lush   BRONCHIAL BIOPSY  09/11/2023   Procedure: BRONCHOSCOPY, WITH BIOPSY;  Surgeon: Matthew Peer, MD;  Location: [facility];  Service: Pulmonary;;   BRONCHIAL BRUSHINGS  09/11/2023   Procedure: BRONCHOSCOPY, WITH BRUSH BIOPSY;  Surgeon: Matthew Peer, MD;  Location: [facility];  Service: Pulmonary;;   BRONCHIAL NEEDLE ASPIRATION BIOPSY  09/11/2023   Procedure: BRONCHOSCOPY, WITH NEEDLE ASPIRATION BIOPSY;  Surgeon: Matthew Peer, MD;  Location: [facility];  Service: Pulmonary;;   CARDIAC SURGERY     CARPAL TUNNEL RELEASE Right 12/02/2010   CARPAL TUNNEL RELEASE  06/06/2011   Procedure: CARPAL TUNNEL RELEASE;  Surgeon: Matthew Forster., MD;  Location: [facility];  Service: Orthopedics;  Laterality: Right;   CARPAL TUNNEL RELEASE Right 04/10/2013   Procedure: REVISION OF CARPAL TUNNEL RELEASE LIGAMENT RIGHT WRIST;  Surgeon: Matthew Forster., MD;  Location: [facility];  Service: Orthopedics;   Laterality: Right;   CERVICAL FUSION  03/03/2009   CHOLECYSTECTOMY     CORONARY ANGIOPLASTY WITH STENT PLACEMENT  [id number]   CORONARY ARTERY BYPASS GRAFT N/A 12/30/2013   Procedure: CORONARY ARTERY BYPASS GRAFTING (CABG) x three, using left internal mammary artery and right leg greater sapheneous vein harvested endoscopically - LIMA to LAD, SVG-D, SVG-OM1;  Surgeon: Kerin Perna, MD;  Location: [facility] OR;  Service: Open Heart Surgery;  Laterality: N/A;   ELBOW ARTHROSCOPY Right    FUDUCIAL PLACEMENT  09/11/2023   Procedure: INSERTION, FIDUCIAL MARKER, GOLD;  Surgeon: Matthew Peer, MD;  Location: [facility];  Service: Pulmonary;;   IRRIGATION AND DEBRIDEMENT SHOULDER Right 04/30/2020   Procedure: Right shoulder poly exchange and possible glensphere exchange and left shoulder injection;  Surgeon: Matthew Low, MD;  Location: [facility];  Service: Orthopedics;  Laterality: Right;   LAPAROSCOPIC GASTRIC BANDING  08/31/2012   left carpal tunnel release      LEFT HEART CATHETERIZATION WITH CORONARY ANGIOGRAM N/A 12/26/2013   Procedure: LEFT HEART CATHETERIZATION WITH CORONARY ANGIOGRAM;  Surgeon: Matthew Lex, MD;  Location: [facility] CATH LAB;  Service: Cardiovascular;  Laterality: N/A;   REVERSE SHOULDER ARTHROPLASTY Right 01/30/2020   Procedure: REVERSE SHOULDER ARTHROPLASTY;  Surgeon: Matthew Low, MD;  Location: [facility];  Service: Orthopedics;  Laterality: Right;  interscalene block   REVERSE SHOULDER ARTHROPLASTY Left 03/30/2023   Procedure: REVERSE SHOULDER ARTHROPLASTY;  Surgeon: Matthew Low, MD;  Location: [facility];  Service: Orthopedics;  Laterality: Left;   SHOULDER ARTHROSCOPY Right 10/01/2009   TOTAL KNEE ARTHROPLASTY Right 07/03/2004   TOTAL KNEE ARTHROPLASTY Left 07/03/2005   ULNAR NERVE TRANSPOSITION  06/06/2011   Procedure: ULNAR NERVE DECOMPRESSION/TRANSPOSITION;  Surgeon: Matthew Forster., MD;  Location: [facility];  Service: Orthopedics;  Laterality: Right;   decompression ulnar nerve right cubital tunnel     Family History  Problem Relation Age of Onset   Colon cancer Father    Obesity Mother    Colon cancer Maternal Grandmother    Diabetes Maternal Grandfather      Social History   Tobacco Use  Smoking Status Never   Passive exposure: Never  Smokeless Tobacco Former   Types: Chew   Quit date: 07/03/2003    Social History   Substance and Sexual Activity  Alcohol Use Yes   Alcohol/week: 1.0 standard drink of alcohol   Types: 1 Cans of beer per week   Comment: 4-5 beers daily     Allergies  Allergen Reactions   Bee Venom    Other     Bee stings - lips and tongue swelling, throat tightens    Chlorhexidine Itching    Pt. States on last admission CHG wipes made him itch and it had to be washed off.  Uncertain if benadryl was given.    Current Outpatient Medications  Medication Sig Dispense Refill  aspirin 81 MG EC tablet Take 81 mg by mouth at bedtime.      atorvastatin (LIPITOR) 20 MG tablet Take 1 tablet (20 mg total) by mouth every evening.     b complex vitamins tablet 1 tablet daily. B12-B6-Folic Acid (Sublingual)     Cholecalciferol (VITAMIN D) 125 MCG (5000 UT) CAPS Take 5,000 Units by mouth daily.      Coenzyme Q10 (CO Q-10) 100 MG CAPS Take 100 mg by mouth daily.      cyclobenzaprine (FLEXERIL) 10 MG tablet Take 1 tablet (10 mg total) by mouth 3 (three) times daily as needed for muscle spasms. 30 tablet 0   EPINEPHrine 0.3 mg/0.3 mL IJ SOAJ injection Inject 0.3 mLs into the skin as needed for anaphylaxis.      Krill Oil 1000 MG CAPS Take 1,000 mg by mouth daily.     loratadine (CLARITIN) 10 MG tablet Take 10 mg by mouth daily as needed for allergies.     losartan (COZAAR) 100 MG tablet Take 100 mg by mouth daily.     LYCOPENE PO Take 1 tablet by mouth daily.     metoprolol (LOPRESSOR) 50 MG tablet Take 50 mg by mouth 2 (two) times daily.     MOUNJARO 15 MG/0.5ML Pen Inject 15 mg into the skin once a week.  Mondays     Multiple Vitamins-Minerals (EYE VITAMINS PO) Take 1 capsule by mouth daily. [facility]     naproxen (NAPROSYN) 500 MG tablet Take 500 mg by mouth 2 (two) times daily with a meal.     nitroGLYCERIN (NITROSTAT) 0.4 MG SL tablet Place 0.4 mg under the tongue every 5 (five) minutes as needed for chest pain. Up to 3x     OLIVE LEAF EXTRACT PO Take 1 capsule by mouth daily. 20,000 mg     Pomegranate, Punica granatum, (POMEGRANATE PO) Take 5,000 mg by mouth daily.     Tadalafil 2.5 MG TABS Take 2.5 mg by mouth every morning.     Turmeric 500 MG TABS Take 500 mg by mouth 2 (two) times daily.      No current facility-administered medications for this visit.    ROS   PHYSICAL EXAMINATION: There were no vitals taken for this visit. Physical Exam       I have independently reviewed the above radiology studies  and reviewed the findings with the patient.   Recent Lab Findings: Lab Results  Component Value Date   WBC 7.7 09/24/2023   HGB 15.2 09/24/2023   HCT 44.5 09/24/2023   PLT 218 09/24/2023   GLUCOSE 93 09/24/2023   CHOL 115 02/22/2014   TRIG 63 02/22/2014   HDL 52 02/22/2014   LDLCALC 50 02/22/2014   ALT 16 09/24/2023   AST 19 09/24/2023   NA 136 09/24/2023   K 4.2 09/24/2023   CL 102 09/24/2023   CREATININE 0.78 09/24/2023   BUN 25 (H) 09/24/2023   CO2 25 09/24/2023   TSH 1.370 02/21/2014   INR 1.32 02/22/2014   HGBA1C 5.2 02/21/2014    Diagnostic Studies & Laboratory data:     Recent Radiology Findings:   CT Super D Chest Wo Contrast Result Date: 09/17/2023 CLINICAL DATA:  Enlarging pulmonary nodule of the right lower lobe * Tracking Code: BO * EXAM: CT CHEST WITHOUT CONTRAST TECHNIQUE: Multidetector CT imaging of the chest was performed using thin slice collimation for electromagnetic bronchoscopy planning purposes, without intravenous contrast. RADIATION DOSE REDUCTION: This exam was performed according to  the departmental dose-optimization program which  includes automated exposure control, adjustment of the mA and/or kV according to patient size and/or use of iterative reconstruction technique. COMPARISON:  PET-CT, 08/14/2023, CT chest, 05/18/2023 FINDINGS: Cardiovascular: Aortic atherosclerosis. Mild cardiomegaly. Three-vessel coronary artery calcifications and stents status post median sternotomy and CABG. No pericardial effusion. Mediastinum/Nodes: No enlarged mediastinal, hilar, or axillary lymph nodes. Thyroid gland, trachea, and esophagus demonstrate no significant findings. Lungs/Pleura: Redemonstrated mixed solid and ground-glass composition subpleural nodular opacity of the paramedian right lower lobe measuring 3.3 x 1.7 cm. This is clearly increased in size and solid character when compared to prior diagnostic chest CT dated 05/18/2023, perhaps slightly increased in size when compared to free breathing PET-CT examination dated 08/14/2023. Mild diffuse bilateral bronchial wall thickening. Scattered tiny calcified bilateral pleural plaques (series 7, image 230). No pleural effusion or pneumothorax. Upper Abdomen: No acute abnormality. Hepatic steatosis. Status post cholecystectomy. Adjustable gastric lap band with subcutaneous reservoir. Musculoskeletal: No chest wall abnormality. No acute osseous findings. 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 IMPRESSION: 1. Redemonstrated mixed solid and ground-glass composition subpleural nodular opacity of the paramedian right lower lobe measuring 3.3 x 1.7 cm. This is clearly increased in size and solid character when compared to prior diagnostic chest CT dated 05/18/2023, perhaps slightly increased in size when compared to free breathing PET-CT examination dated 08/14/2023. Despite lack of FDG avidity on prior examination, appearance and behavior are worrisome for adenocarcinoma. Infectious or inflammatory etiology does however remain a significant differential consideration. 2.  No evidence of lymphadenopathy or metastatic disease in the chest. 3. Scattered tiny calcified bilateral pleural plaques, suggesting prior asbestos exposure, although conceivably related to prior hemothorax in the setting of median sternotomy. 4. Coronary artery disease status post median sternotomy and CABG. Chronic sternal nonunion and wire fractures. 5. Hepatic steatosis. Aortic Atherosclerosis (ICD10-I70.0). Electronically Signed   By: Jearld Lesch M.D.   On: 09/17/2023 13:[name]   DG Chest Port 1 View Result Date: 09/11/2023 CLINICAL DATA:  Status post bronchoscopy and biopsy. EXAM: PORTABLE CHEST 1 VIEW COMPARISON:  Chest radiograph dated 03/06/2023 and CT dated 09/05/2023. FINDINGS: No focal consolidation, pleural effusion, or pneumothorax. Mild cardiomegaly. Median sternotomy wires. No acute osseous pathology. IMPRESSION: No pneumothorax. Electronically Signed   By: Elgie Collard M.D.   On: 09/11/2023 12:30   DG Matthew-ARM BRONCHOSCOPY Result Date: 09/11/2023 Matthew-ARM BRONCHOSCOPY: Fluoroscopy was utilized by the requesting physician.  No radiographic interpretation.     PFTs:  - FVC: 75% - FEV1: 83% -DLCO: 92%  FINAL MICROSCOPIC DIAGNOSIS:  A. LUNG, RLL, BRUSHING:  - Atypical cells suspicious for adenocarcinoma  - See comment   B. LUNG, RLL, FINE NEEDLE ASPIRATION:  - Adenocarcinoma  - See comment     Assessment / Plan:   73yo male with 3.3cm right lower lobe NSCLC.  Acceptable pulmonary functions.  MRI brain negative.  RLLectomy     I  spent {CHL ONC TIME VISIT - [id number] with  the patient face to face in counseling and coordination of care.    Corliss Skains 10/04/2023 4:04 PM

## 2023-10-04 NOTE — H&P (View-Only) (Signed)
 [address]       [place] [address]             [phone]                    [facility]AV[facility] Medical Record #[id number] Date of Birth: August 11, 1950  Referring: Heilingoetter, Cassandr* Primary Care: Matthew Alcide, MD Primary Cardiologist: Matthew Dell, MD  Chief Complaint:   No chief complaint on file.   History of Present Illness:    [name] 73 y.o. male presents for surgical evaluation of a ***  [name] is a 73 y.o. male with a past medical history significant for atrial flutter, hypertension, coronary artery disease, hyperlipidemia, obesity, and CABG referred to the clinic for newly diagnosed non-small cell lung cancer, adenocarcinoma.   An incidental pulmonary nodule was found on imaging for surveillance of aneurysm. The patient states he is followed by cardiology. His most recent echo was on 09/20/21. Per pulmonary's note, the nodular area in the posteromedial right lower lobe had increased in size since prior study (6 month ago), now measuring 3.3 x 1.2 cm.    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 Imaging shows bilateral calcified pleural plaques consistent with asbestos exposure, though these are not related to the current nodule of concern.    She was seen by pulmonary medicine who recommended PET scan. The PET scan was performed on 08/14/2023 which showed continued growth of the nodule and is increasingly solid but it did not have any significant hypermetabolic activity but remained suspicious for adenocarcinoma.  There is no hypermetabolic activity in the neck, chest, abdomen, or pelvis.   Dr. Delton [name] arrange for bronchoscopy on 09/11/2023. The final pathology ([id number]) of the right lower lobe FNA showed adenocarcinoma.   His case was discussed at the multidisciplinary conference on 09/20/2023.  The recommendation  was to perform PFTs, complete the staging workup with brain MRI, and see if he is a candidate for surgery.  Based on location, it was felt that surgery would be a better option than radiation.   Smoking Hx: ***      Past Medical History:  Diagnosis Date   Aortic root dilatation (HCC)    Arthritis    Coronary atherosclerosis of native coronary artery    Multiple stents - RCA/LAD/diagonal, overlapping DES LAD, stent thrombosis 2005, ultimately CABG   Essential hypertension    H/O hiatal hernia    History of GI bleed    On nonsteroidals   Hyperlipemia    Myocardial infarction [facility])    Anterior with LAD stent thrombosis 2005, 2004 and 2005    Obesity    Paroxysmal atrial flutter (HCC)    Sleep apnea    CPAP daily    Past Surgical History:  Procedure Laterality Date   APPENDECTOMY     BREATH TEK H PYLORI  06/11/2012   Procedure: BREATH TEK H PYLORI;  Surgeon: Matthew Cocking, MD;  Location: [facility];  Service: General;  Laterality: N/ASue Lush   BRONCHIAL BIOPSY  09/11/2023   Procedure: BRONCHOSCOPY, WITH BIOPSY;  Surgeon: Matthew Peer, MD;  Location: [facility];  Service: Pulmonary;;   BRONCHIAL BRUSHINGS  09/11/2023   Procedure: BRONCHOSCOPY, WITH BRUSH BIOPSY;  Surgeon: Matthew Peer, MD;  Location: [facility];  Service: Pulmonary;;   BRONCHIAL NEEDLE ASPIRATION BIOPSY  09/11/2023   Procedure: BRONCHOSCOPY, WITH NEEDLE ASPIRATION BIOPSY;  Surgeon: Matthew Peer, MD;  Location: [facility];  Service: Pulmonary;;   CARDIAC SURGERY     CARPAL TUNNEL RELEASE Right 12/02/2010   CARPAL TUNNEL RELEASE  06/06/2011   Procedure: CARPAL TUNNEL RELEASE;  Surgeon: Matthew Forster., MD;  Location: [facility];  Service: Orthopedics;  Laterality: Right;   CARPAL TUNNEL RELEASE Right 04/10/2013   Procedure: REVISION OF CARPAL TUNNEL RELEASE LIGAMENT RIGHT WRIST;  Surgeon: Matthew Forster., MD;  Location: [facility];  Service: Orthopedics;   Laterality: Right;   CERVICAL FUSION  03/03/2009   CHOLECYSTECTOMY     CORONARY ANGIOPLASTY WITH STENT PLACEMENT  [id number]   CORONARY ARTERY BYPASS GRAFT N/A 12/30/2013   Procedure: CORONARY ARTERY BYPASS GRAFTING (CABG) x three, using left internal mammary artery and right leg greater sapheneous vein harvested endoscopically - LIMA to LAD, SVG-D, SVG-OM1;  Surgeon: Kerin Perna, MD;  Location: [facility] OR;  Service: Open Heart Surgery;  Laterality: N/A;   ELBOW ARTHROSCOPY Right    FUDUCIAL PLACEMENT  09/11/2023   Procedure: INSERTION, FIDUCIAL MARKER, GOLD;  Surgeon: Matthew Peer, MD;  Location: [facility];  Service: Pulmonary;;   IRRIGATION AND DEBRIDEMENT SHOULDER Right 04/30/2020   Procedure: Right shoulder poly exchange and possible glensphere exchange and left shoulder injection;  Surgeon: Matthew Low, MD;  Location: [facility];  Service: Orthopedics;  Laterality: Right;   LAPAROSCOPIC GASTRIC BANDING  08/31/2012   left carpal tunnel release      LEFT HEART CATHETERIZATION WITH CORONARY ANGIOGRAM N/A 12/26/2013   Procedure: LEFT HEART CATHETERIZATION WITH CORONARY ANGIOGRAM;  Surgeon: Matthew Lex, MD;  Location: [facility] CATH LAB;  Service: Cardiovascular;  Laterality: N/A;   REVERSE SHOULDER ARTHROPLASTY Right 01/30/2020   Procedure: REVERSE SHOULDER ARTHROPLASTY;  Surgeon: Matthew Low, MD;  Location: [facility];  Service: Orthopedics;  Laterality: Right;  interscalene block   REVERSE SHOULDER ARTHROPLASTY Left 03/30/2023   Procedure: REVERSE SHOULDER ARTHROPLASTY;  Surgeon: Matthew Low, MD;  Location: [facility];  Service: Orthopedics;  Laterality: Left;   SHOULDER ARTHROSCOPY Right 10/01/2009   TOTAL KNEE ARTHROPLASTY Right 07/03/2004   TOTAL KNEE ARTHROPLASTY Left 07/03/2005   ULNAR NERVE TRANSPOSITION  06/06/2011   Procedure: ULNAR NERVE DECOMPRESSION/TRANSPOSITION;  Surgeon: Matthew Forster., MD;  Location: [facility];  Service: Orthopedics;  Laterality: Right;   decompression ulnar nerve right cubital tunnel     Family History  Problem Relation Age of Onset   Colon cancer Father    Obesity Mother    Colon cancer Maternal Grandmother    Diabetes Maternal Grandfather      Social History   Tobacco Use  Smoking Status Never   Passive exposure: Never  Smokeless Tobacco Former   Types: Chew   Quit date: 07/03/2003    Social History   Substance and Sexual Activity  Alcohol Use Yes   Alcohol/week: 1.0 standard drink of alcohol   Types: 1 Cans of beer per week   Comment: 4-5 beers daily     Allergies  Allergen Reactions   Bee Venom    Other     Bee stings - lips and tongue swelling, throat tightens    Chlorhexidine Itching    Pt. States on last admission CHG wipes made him itch and it had to be washed off.  Uncertain if benadryl was given.    Current Outpatient Medications  Medication Sig Dispense Refill  aspirin 81 MG EC tablet Take 81 mg by mouth at bedtime.      atorvastatin (LIPITOR) 20 MG tablet Take 1 tablet (20 mg total) by mouth every evening.     b complex vitamins tablet 1 tablet daily. B12-B6-Folic Acid (Sublingual)     Cholecalciferol (VITAMIN D) 125 MCG (5000 UT) CAPS Take 5,000 Units by mouth daily.      Coenzyme Q10 (CO Q-10) 100 MG CAPS Take 100 mg by mouth daily.      cyclobenzaprine (FLEXERIL) 10 MG tablet Take 1 tablet (10 mg total) by mouth 3 (three) times daily as needed for muscle spasms. 30 tablet 0   EPINEPHrine 0.3 mg/0.3 mL IJ SOAJ injection Inject 0.3 mLs into the skin as needed for anaphylaxis.      Krill Oil 1000 MG CAPS Take 1,000 mg by mouth daily.     loratadine (CLARITIN) 10 MG tablet Take 10 mg by mouth daily as needed for allergies.     losartan (COZAAR) 100 MG tablet Take 100 mg by mouth daily.     LYCOPENE PO Take 1 tablet by mouth daily.     metoprolol (LOPRESSOR) 50 MG tablet Take 50 mg by mouth 2 (two) times daily.     MOUNJARO 15 MG/0.5ML Pen Inject 15 mg into the skin once a week.  Mondays     Multiple Vitamins-Minerals (EYE VITAMINS PO) Take 1 capsule by mouth daily. [facility]     naproxen (NAPROSYN) 500 MG tablet Take 500 mg by mouth 2 (two) times daily with a meal.     nitroGLYCERIN (NITROSTAT) 0.4 MG SL tablet Place 0.4 mg under the tongue every 5 (five) minutes as needed for chest pain. Up to 3x     OLIVE LEAF EXTRACT PO Take 1 capsule by mouth daily. 20,000 mg     Pomegranate, Punica granatum, (POMEGRANATE PO) Take 5,000 mg by mouth daily.     Tadalafil 2.5 MG TABS Take 2.5 mg by mouth every morning.     Turmeric 500 MG TABS Take 500 mg by mouth 2 (two) times daily.      No current facility-administered medications for this visit.    ROS   PHYSICAL EXAMINATION: There were no vitals taken for this visit. Physical Exam       I have independently reviewed the above radiology studies  and reviewed the findings with the patient.   Recent Lab Findings: Lab Results  Component Value Date   WBC 7.7 09/24/2023   HGB 15.2 09/24/2023   HCT 44.5 09/24/2023   PLT 218 09/24/2023   GLUCOSE 93 09/24/2023   CHOL 115 02/22/2014   TRIG 63 02/22/2014   HDL 52 02/22/2014   LDLCALC 50 02/22/2014   ALT 16 09/24/2023   AST 19 09/24/2023   NA 136 09/24/2023   K 4.2 09/24/2023   CL 102 09/24/2023   CREATININE 0.78 09/24/2023   BUN 25 (H) 09/24/2023   CO2 25 09/24/2023   TSH 1.370 02/21/2014   INR 1.32 02/22/2014   HGBA1C 5.2 02/21/2014    Diagnostic Studies & Laboratory data:     Recent Radiology Findings:   CT Super D Chest Wo Contrast Result Date: 09/17/2023 CLINICAL DATA:  Enlarging pulmonary nodule of the right lower lobe * Tracking Code: BO * EXAM: CT CHEST WITHOUT CONTRAST TECHNIQUE: Multidetector CT imaging of the chest was performed using thin slice collimation for electromagnetic bronchoscopy planning purposes, without intravenous contrast. RADIATION DOSE REDUCTION: This exam was performed according to  the departmental dose-optimization program which  includes automated exposure control, adjustment of the mA and/or kV according to patient size and/or use of iterative reconstruction technique. COMPARISON:  PET-CT, 08/14/2023, CT chest, 05/18/2023 FINDINGS: Cardiovascular: Aortic atherosclerosis. Mild cardiomegaly. Three-vessel coronary artery calcifications and stents status post median sternotomy and CABG. No pericardial effusion. Mediastinum/Nodes: No enlarged mediastinal, hilar, or axillary lymph nodes. Thyroid gland, trachea, and esophagus demonstrate no significant findings. Lungs/Pleura: Redemonstrated mixed solid and ground-glass composition subpleural nodular opacity of the paramedian right lower lobe measuring 3.3 x 1.7 cm. This is clearly increased in size and solid character when compared to prior diagnostic chest CT dated 05/18/2023, perhaps slightly increased in size when compared to free breathing PET-CT examination dated 08/14/2023. Mild diffuse bilateral bronchial wall thickening. Scattered tiny calcified bilateral pleural plaques (series 7, image 230). No pleural effusion or pneumothorax. Upper Abdomen: No acute abnormality. Hepatic steatosis. Status post cholecystectomy. Adjustable gastric lap band with subcutaneous reservoir. Musculoskeletal: No chest wall abnormality. No acute osseous findings. 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 IMPRESSION: 1. Redemonstrated mixed solid and ground-glass composition subpleural nodular opacity of the paramedian right lower lobe measuring 3.3 x 1.7 cm. This is clearly increased in size and solid character when compared to prior diagnostic chest CT dated 05/18/2023, perhaps slightly increased in size when compared to free breathing PET-CT examination dated 08/14/2023. Despite lack of FDG avidity on prior examination, appearance and behavior are worrisome for adenocarcinoma. Infectious or inflammatory etiology does however remain a significant differential consideration. 2.  No evidence of lymphadenopathy or metastatic disease in the chest. 3. Scattered tiny calcified bilateral pleural plaques, suggesting prior asbestos exposure, although conceivably related to prior hemothorax in the setting of median sternotomy. 4. Coronary artery disease status post median sternotomy and CABG. Chronic sternal nonunion and wire fractures. 5. Hepatic steatosis. Aortic Atherosclerosis (ICD10-I70.0). Electronically Signed   By: Jearld Lesch M.D.   On: 09/17/2023 13:49   DG Chest Port 1 View Result Date: 09/11/2023 CLINICAL DATA:  Status post bronchoscopy and biopsy. EXAM: PORTABLE CHEST 1 VIEW COMPARISON:  Chest radiograph dated 03/06/2023 and CT dated 09/05/2023. FINDINGS: No focal consolidation, pleural effusion, or pneumothorax. Mild cardiomegaly. Median sternotomy wires. No acute osseous pathology. IMPRESSION: No pneumothorax. Electronically Signed   By: Elgie Collard M.D.   On: 09/11/2023 12:30   DG C-ARM BRONCHOSCOPY Result Date: 09/11/2023 C-ARM BRONCHOSCOPY: Fluoroscopy was utilized by the requesting physician.  No radiographic interpretation.     PFTs:  - FVC: 75% - FEV1: 83% -DLCO: 92%  FINAL MICROSCOPIC DIAGNOSIS:  A. LUNG, RLL, BRUSHING:  - Atypical cells suspicious for adenocarcinoma  - See comment   B. LUNG, RLL, FINE NEEDLE ASPIRATION:  - Adenocarcinoma  - See comment     Assessment / Plan:   73yo male with 3.3cm right lower lobe NSCLC.  Acceptable pulmonary functions.  MRI brain negative.  RLLectomy     I  spent {CHL ONC TIME VISIT - [id number] with  the patient face to face in counseling and coordination of care.    Corliss Skains 10/04/2023 4:04 PM

## 2023-10-05 ENCOUNTER — Encounter: Payer: Self-pay | Admitting: *Deleted

## 2023-10-05 ENCOUNTER — Other Ambulatory Visit: Payer: Self-pay | Admitting: *Deleted

## 2023-10-05 ENCOUNTER — Institutional Professional Consult (permissible substitution): Admitting: Thoracic Surgery (Cardiothoracic Vascular Surgery)

## 2023-10-05 VITALS — BP 162/81 | HR 78 | Resp 18 | Ht 66.0 in | Wt 228.0 lb

## 2023-10-05 DIAGNOSIS — R911 Solitary pulmonary nodule: Secondary | ICD-10-CM | POA: Diagnosis not present

## 2023-10-05 DIAGNOSIS — C3490 Malignant neoplasm of unspecified part of unspecified bronchus or lung: Secondary | ICD-10-CM

## 2023-10-05 NOTE — Progress Notes (Addendum)
 Surgical Instructions   Your procedure is scheduled on October 11, 2023. Report to [facility] Main Entrance "A" at 9:30 A.M., then check in with the Admitting office. Any questions or running late day of surgery: call [phone]  Questions prior to your surgery date: call [phone], Monday-Friday, 8am-4pm. If you experience any cold or flu symptoms such as cough, fever, chills, shortness of breath, etc. between now and your scheduled surgery, please notify us at the above number.     Remember:  Do not eat or drink after midnight the night before your surgery      Take these medicines the morning of surgery with A SIP OF WATER  aspirin 81  metoprolol (LOPRESSOR)     May take these medicines IF NEEDED: cyclobenzaprine (FLEXERIL)  EPINEPHrine injection  loratadine (CLARITIN)   MOUNJARO Last dose: 09-30-23  One week prior to surgery, STOP taking any Aspirin (unless otherwise instructed by your surgeon) Aleve, Naproxen, Ibuprofen, Motrin, Advil, Goody's, BC's, all herbal medications, fish oil, and non-prescription vitamins.                     Do NOT Smoke (Tobacco/Vaping) for 24 hours prior to your procedure.  If you use a CPAP at night, you may bring your mask/headgear for your overnight stay.   You will be asked to remove any contacts, glasses, piercing's, hearing aid's, dentures/partials prior to surgery. Please bring cases for these items if needed.    Patients discharged the day of surgery will not be allowed to drive home, and someone needs to stay with them for 24 hours.  SURGICAL WAITING ROOM VISITATION Patients may have no more than 2 support people in the waiting area - these visitors may rotate.   Pre-op nurse will coordinate an appropriate time for 1 ADULT support person, who may not rotate, to accompany patient in pre-op.  Children under the age of 40 must have an adult with them who is not the patient and must remain in the main waiting area with an  adult.  If the patient needs to stay at the hospital during part of their recovery, the visitor guidelines for inpatient rooms apply.  Please refer to the [facility] website for the visitor guidelines for any additional information.   If you received a COVID test during your pre-op visit  it is requested that you wear a mask when out in public, stay away from anyone that may not be feeling well and notify your surgeon if you develop symptoms. If you have been in contact with anyone that has tested positive in the last 10 days please notify you surgeon.      Pre-operative CHG Bathing Instructions   You can play a key role in reducing the risk of infection after surgery. Your skin needs to be as free of germs as possible. You can reduce the number of germs on your skin by washing with CHG (chlorhexidine gluconate) soap before surgery. CHG is an antiseptic soap that kills germs and continues to kill germs even after washing.   DO NOT use if you have an allergy to chlorhexidine/CHG or antibacterial soaps. If your skin becomes reddened or irritated, stop using the CHG and notify one of our RNs at [phone].              TAKE A SHOWER THE NIGHT BEFORE SURGERY AND THE DAY OF SURGERY    Please keep in mind the following:  DO NOT shave, including legs and  underarms, 48 hours prior to surgery.   You may shave your face before/day of surgery.  Place clean sheets on your bed the night before surgery Use a clean washcloth (not used since being washed) for each shower. DO NOT sleep with pet's night before surgery.  CHG Shower Instructions:  Wash your face and private area with normal soap. If you choose to wash your hair, wash first with your normal shampoo.  After you use shampoo/soap, rinse your hair and body thoroughly to remove shampoo/soap residue.  Turn the water OFF and apply half the bottle of CHG soap to a CLEAN washcloth.  Apply CHG soap ONLY FROM YOUR NECK DOWN TO YOUR TOES (washing  for 3-5 minutes)  DO NOT use CHG soap on face, private areas, open wounds, or sores.  Pay special attention to the area where your surgery is being performed.  If you are having back surgery, having someone wash your back for you may be helpful. Wait 2 minutes after CHG soap is applied, then you may rinse off the CHG soap.  Pat dry with a clean towel  Put on clean pajamas    Additional instructions for the day of surgery: DO NOT APPLY any lotions, deodorants, cologne, or perfumes.   Do not wear jewelry or makeup Do not wear nail polish, gel polish, artificial nails, or any other type of covering on natural nails (fingers and toes) Do not bring valuables to the hospital. [facility] is not responsible for valuables/personal belongings. Put on clean/comfortable clothes.  Please brush your teeth.  Ask your nurse before applying any prescription medications to the skin.

## 2023-10-08 ENCOUNTER — Ambulatory Visit (HOSPITAL_COMMUNITY)
Admission: RE | Admit: 2023-10-08 | Discharge: 2023-10-08 | Disposition: A | Discharge status: Home / Self Care | Source: Ambulatory Visit | Attending: Cardiology | Admitting: Cardiology

## 2023-10-08 ENCOUNTER — Ambulatory Visit (HOSPITAL_BASED_OUTPATIENT_CLINIC_OR_DEPARTMENT_OTHER)
Admission: RE | Admit: 2023-10-08 | Discharge: 2023-10-08 | Disposition: A | Discharge status: Home / Self Care | Source: Ambulatory Visit | Attending: Cardiology | Admitting: Cardiology

## 2023-10-08 ENCOUNTER — Other Ambulatory Visit: Payer: Self-pay

## 2023-10-08 ENCOUNTER — Ambulatory Visit (HOSPITAL_COMMUNITY)
Admission: RE | Admit: 2023-10-08 | Discharge: 2023-10-08 | Disposition: A | Discharge status: Home / Self Care | Source: Ambulatory Visit | Attending: Thoracic Surgery (Cardiothoracic Vascular Surgery)

## 2023-10-08 ENCOUNTER — Encounter (HOSPITAL_COMMUNITY): Payer: Self-pay

## 2023-10-08 ENCOUNTER — Encounter (HOSPITAL_COMMUNITY)
Admission: RE | Admit: 2023-10-08 | Discharge: 2023-10-08 | Disposition: A | Discharge status: Home / Self Care | Source: Ambulatory Visit | Attending: Internal Medicine | Admitting: Internal Medicine

## 2023-10-08 VITALS — BP 143/78 | HR 77 | Temp 98.1°F | Resp 19 | Ht 66.0 in | Wt 234.1 lb

## 2023-10-08 DIAGNOSIS — I10 Essential (primary) hypertension: Secondary | ICD-10-CM | POA: Diagnosis not present

## 2023-10-08 DIAGNOSIS — E785 Hyperlipidemia, unspecified: Secondary | ICD-10-CM | POA: Insufficient documentation

## 2023-10-08 DIAGNOSIS — C3490 Malignant neoplasm of unspecified part of unspecified bronchus or lung: Secondary | ICD-10-CM

## 2023-10-08 DIAGNOSIS — I352 Nonrheumatic aortic (valve) stenosis with insufficiency: Secondary | ICD-10-CM | POA: Insufficient documentation

## 2023-10-08 DIAGNOSIS — Z01810 Encounter for preprocedural cardiovascular examination: Secondary | ICD-10-CM

## 2023-10-08 DIAGNOSIS — Z79899 Other long term (current) drug therapy: Secondary | ICD-10-CM | POA: Diagnosis not present

## 2023-10-08 DIAGNOSIS — Z01812 Encounter for preprocedural laboratory examination: Secondary | ICD-10-CM | POA: Diagnosis present

## 2023-10-08 DIAGNOSIS — Z01818 Encounter for other preprocedural examination: Secondary | ICD-10-CM | POA: Insufficient documentation

## 2023-10-08 DIAGNOSIS — Z951 Presence of aortocoronary bypass graft: Secondary | ICD-10-CM | POA: Insufficient documentation

## 2023-10-08 DIAGNOSIS — C3431 Malignant neoplasm of lower lobe, right bronchus or lung: Secondary | ICD-10-CM | POA: Diagnosis not present

## 2023-10-08 DIAGNOSIS — I440 Atrioventricular block, first degree: Secondary | ICD-10-CM | POA: Diagnosis not present

## 2023-10-08 DIAGNOSIS — I2510 Atherosclerotic heart disease of native coronary artery without angina pectoris: Secondary | ICD-10-CM | POA: Diagnosis not present

## 2023-10-08 DIAGNOSIS — Z955 Presence of coronary angioplasty implant and graft: Secondary | ICD-10-CM | POA: Insufficient documentation

## 2023-10-08 DIAGNOSIS — I25119 Atherosclerotic heart disease of native coronary artery with unspecified angina pectoris: Secondary | ICD-10-CM | POA: Insufficient documentation

## 2023-10-08 DIAGNOSIS — I480 Paroxysmal atrial fibrillation: Secondary | ICD-10-CM | POA: Diagnosis not present

## 2023-10-08 DIAGNOSIS — Z96611 Presence of right artificial shoulder joint: Secondary | ICD-10-CM | POA: Diagnosis not present

## 2023-10-08 DIAGNOSIS — I252 Old myocardial infarction: Secondary | ICD-10-CM | POA: Insufficient documentation

## 2023-10-08 DIAGNOSIS — Z9884 Bariatric surgery status: Secondary | ICD-10-CM | POA: Diagnosis not present

## 2023-10-08 DIAGNOSIS — Z96612 Presence of left artificial shoulder joint: Secondary | ICD-10-CM | POA: Insufficient documentation

## 2023-10-08 DIAGNOSIS — G4733 Obstructive sleep apnea (adult) (pediatric): Secondary | ICD-10-CM | POA: Diagnosis not present

## 2023-10-08 DIAGNOSIS — I4892 Unspecified atrial flutter: Secondary | ICD-10-CM | POA: Insufficient documentation

## 2023-10-08 DIAGNOSIS — Z87891 Personal history of nicotine dependence: Secondary | ICD-10-CM | POA: Insufficient documentation

## 2023-10-08 LAB — URINALYSIS, ROUTINE W REFLEX MICROSCOPIC
Bilirubin Urine: NEGATIVE
Glucose, UA: NEGATIVE mg/dL
Hgb urine dipstick: NEGATIVE
Ketones, ur: NEGATIVE mg/dL
Leukocytes,Ua: NEGATIVE
Nitrite: NEGATIVE
Protein, ur: NEGATIVE mg/dL
Specific Gravity, Urine: 1.011 (ref 1.005–1.030)
pH: 7 (ref 5.0–8.0)

## 2023-10-08 LAB — COMPREHENSIVE METABOLIC PANEL WITH GFR
ALT: 21 U/L (ref 0–44)
AST: 22 U/L (ref 15–41)
Albumin: 4 g/dL (ref 3.5–5.0)
Alkaline Phosphatase: 41 U/L (ref 38–126)
Anion gap: 10 (ref 5–15)
BUN: 20 mg/dL (ref 8–23)
CO2: 23 mmol/L (ref 22–32)
Calcium: 9.4 mg/dL (ref 8.9–10.3)
Chloride: 106 mmol/L (ref 98–111)
Creatinine, Ser: 0.77 mg/dL (ref 0.61–1.24)
GFR, Estimated: >60 mL/min (ref >60)
Glucose, Bld: 96 mg/dL (ref 70–99)
Potassium: 4.2 mmol/L (ref 3.5–5.1)
Sodium: 139 mmol/L (ref 135–145)
Total Bilirubin: 0.6 mg/dL (ref 0.0–1.2)
Total Protein: 6.8 g/dL (ref 6.5–8.1)

## 2023-10-08 LAB — NM MYOCAR MULTI W/SPECT W/WALL MOTION / EF
LV dias vol: 139 mL (ref 62–150)
LV sys vol: 64 mL
Nuc Stress EF: 54 %
Peak HR: 82 {beats}/min
RATE: 0.3
Rest HR: 65 {beats}/min
Rest Nuclear Isotope Dose: 11 mCi
SDS: 0
SRS: 3
SSS: 3
ST Depression (mm): 0 mm
Stress Nuclear Isotope Dose: 32 mCi
TID: 1.19

## 2023-10-08 LAB — TYPE AND SCREEN
ABO/RH(D): A POS
Antibody Screen: NEGATIVE

## 2023-10-08 LAB — CBC
HCT: 45.1 % (ref 39.0–52.0)
Hemoglobin: 15 g/dL (ref 13.0–17.0)
MCH: 30.6 pg (ref 26.0–34.0)
MCHC: 33.3 g/dL (ref 30.0–36.0)
MCV: 92 fL (ref 80.0–100.0)
Platelets: 207 10*3/uL (ref 150–400)
RBC: 4.9 MIL/uL (ref 4.22–5.81)
RDW: 13.5 % (ref 11.5–15.5)
WBC: 6.3 10*3/uL (ref 4.0–10.5)
nRBC: 0 % (ref 0.0–0.2)

## 2023-10-08 LAB — PROTIME-INR
INR: 1 (ref 0.8–1.2)
Prothrombin Time: 13.1 s (ref 11.4–15.2)

## 2023-10-08 LAB — APTT: aPTT: 28 s (ref 24–36)

## 2023-10-08 LAB — SURGICAL PCR SCREEN
MRSA, PCR: NEGATIVE
Staphylococcus aureus: NEGATIVE

## 2023-10-08 MED ORDER — TECHNETIUM TC 99M TETROFOSMIN IV KIT
30.0000 | PACK | Freq: Once | INTRAVENOUS | Status: AC | PRN
  Administered 2023-10-08: 32 via INTRAVENOUS

## 2023-10-08 MED ORDER — SODIUM CHLORIDE FLUSH 0.9 % IV SOLN
INTRAVENOUS | Status: AC
Start: 2023-10-08 — End: 2023-10-08
  Administered 2023-10-08: 10 mL via INTRAVENOUS
  Filled 2023-10-08: qty 10

## 2023-10-08 MED ORDER — TECHNETIUM TC 99M TETROFOSMIN IV KIT
10.0000 | PACK | Freq: Once | INTRAVENOUS | Status: AC | PRN
  Administered 2023-10-08: 11 via INTRAVENOUS

## 2023-10-08 MED ORDER — REGADENOSON 0.4 MG/5ML IV SOLN
INTRAVENOUS | Status: AC
  Administered 2023-10-08: 0.4 mg via INTRAVENOUS
  Filled 2023-10-08: qty 5

## 2023-10-08 NOTE — Progress Notes (Signed)
 PCP - Juliette Alcide, MD  Cardiologist - Nona Dell, MD   PPM/ICD - denies Device Orders - n/a Rep Notified - n/a  Chest x-ray - 10-08-23 EKG - 10-08-23 Stress Test - 10-08-23 ECHO - 10-04-23 Cardiac Cath - 2015 PFT- 10-03-23  Sleep Study -   CPAP - uses nightly  DM -denies  Blood Thinner Instructions: denies Aspirin Instructions:continue  ERAS Protcol -NPO PRE-SURGERY Ensure or G2-   COVID TEST- n/a   Anesthesia review: yes  Patient denies shortness of breath, fever, cough and chest pain at PAT appointment   All instructions explained to the patient, with a verbal understanding of the material. Patient agrees to go over the instructions while at home for a better understanding. Patient also instructed to self quarantine after being tested for COVID-19. The opportunity to ask questions was provided.

## 2023-10-09 LAB — ECHOCARDIOGRAM COMPLETE
AR max vel: 2.13 cm2
AV Area VTI: 2.44 cm2
AV Area mean vel: 2.42 cm2
AV Mean grad: 11.8 mmHg
AV Peak grad: 20.3 mmHg
AV Vena cont: 0.6 cm
Ao pk vel: 2.25 m/s
Area-P 1/2: 2.47 cm2
Calc EF: 56.4 %
Est EF: 55
MV VTI: 4.57 cm2
P 1/2 time: 408 ms
S' Lateral: 3.9 cm
Single Plane A2C EF: 54.4 %
Single Plane A4C EF: 53.2 %

## 2023-10-09 NOTE — Progress Notes (Signed)
 Anesthesia Chart Review:  Case: [id number] Date/Time: 10/11/23 1117   Procedure: LOBECTOMY, LUNG, ROBOT-ASSISTED, USING VATS (Right: Chest) - RIGHT ROBOTIC ASSISTED THORACOSCOPY FOR RIGHT LOWER LOBECTOMY   Anesthesia type: General   Diagnosis: Malignant neoplasm of lung, unspecified laterality, unspecified part of lung (HCC) [C34.90]   Pre-op diagnosis: lung cancer   [facility] / [facility]   Surgeons: Corliss Skains, MD       DISCUSSION: Patient is a 73 year old male scheduled for the above procedure.  History includes never smoker  (former smokeless tobacco, quit 2004), HTN, CAD (multiple stents with MI ~ 2004/ & 2005 for LAD stent thrombosis; s/p CABG: LIMA-LAD, SVG-DIAG, SVG-OM1 12/30/2013), Aortic valve disease (mild-moderate AR, mild AS 10/2023 TTE), aortic root dilation /TAA (41 mm  root, ascending aorta 44 mm4/2025), paroxysmal aflutter (~ 2015, not on anticoagulation), OSA (on CPAP), bariatric surgery (laparoscopic adjustable gastric band  09/10/22), osteoarthritis (right reverse TSA 01/30/20; left reverse TSA 03/30/23), lung cancer (09/11/23 bronchoscopy RLL adenocarcinoma).   For anesthesia history, he reported that for 01/30/2020 reverse TSA, he failed block and phrenic nerve palsy and difficulty breathing for weeks after procedure     Last cardiology visit was on 10/02/23 with Dr. Diona Browner for follow-up CAD, HTN, HLD, TAA and for preoperative cardiology evaluation. He wrote: Preoperative cardiac evaluation. He recommended a preoperative stress test and echocardiogram. 10/04/23 TTE showed LVEF 55%, endocardial border not optimally defined to evaluate regional wall motion, grade 1 diastolic dysfunction, normal RV systolic function, normal PASP, moderate AV calcification with mild to moderate AR, mild AS with mean gradient 11.8 mmHg, aortic root 41 mm, ascending thoracic aorta 44 mm.  10/08/2023 nuclear stress test showed findings consistent with prior inferior/inferoseptal infarct, no  current ischemia, EF 55 to 65%.   He was evaluated by Dr. Cliffton Asters on 10/05/23. He was instructed to hold Mounjaro at that time, reportedly last dose 09/30/23.   VS: BP (!) 143/78   Pulse 77   Temp 36.7 C   Resp 19   Ht 5\' 6"  (1.676 m)   Wt 106.2 kg   SpO2 98%   BMI 37.78 kg/m    PROVIDERS: [name], Ananias Pilgrim, MD is PCP  Nona Dell, MD is cardiologist  Levy Pupa, MD is pulmonologist   LABS: Labs reviewed: Acceptable for surgery. (all labs ordered are listed, but only abnormal results are displayed)  Labs Reviewed  SURGICAL PCR SCREEN  CBC  COMPREHENSIVE METABOLIC PANEL WITH GFR  PROTIME-INR  APTT  URINALYSIS, ROUTINE W REFLEX MICROSCOPIC  TYPE AND SCREEN    PFTs 10/03/23: FVC 2.77 (75%), post 2.83 (77%). FEV1 2.20 (83%), post 2.21 (83%). DLCO unc 21.01 (93%), cor 20.67 (92%).   IMAGES: CXR 10/08/23: In process.  MRI Brain 10/01/23: In process.  CT Super D Chest 09/05/23: IMPRESSION: 1. Redemonstrated mixed solid and ground-glass composition subpleural nodular opacity of the paramedian right lower lobe measuring 3.3 x 1.7 cm. This is clearly increased in size and solid character when compared to prior diagnostic chest CT dated 05/18/2023, perhaps slightly increased in size when compared to free breathing PET-CT examination dated 08/14/2023. Despite lack of FDG avidity on prior examination, appearance and behavior are worrisome for adenocarcinoma. Infectious or inflammatory etiology does however remain a significant differential consideration. 2. No evidence of lymphadenopathy or metastatic disease in the chest. 3. Scattered tiny calcified bilateral pleural plaques, suggesting prior asbestos exposure, although conceivably related to prior hemothorax in the setting of median sternotomy. 4. Coronary artery disease status  post median sternotomy and CABG. Chronic sternal nonunion and wire fractures. 5. Hepatic steatosis.  - Aortic Atherosclerosis  (ICD10-I70.0).  PET Scan 08/14/23: MPRESSION: 1. The subpleural pulmonary nodule of concern inferomedially in the right lower lobe demonstrates continued growth and is increasingly solid but demonstrates no significant hypermetabolic activity. Findings remain suspicious for possible slow growing adenocarcinoma. Consider tissue sampling. 2. No hypermetabolic activity identified within the neck, chest, abdomen or pelvis. 3. No evidence of metastatic disease. 4.  Aortic Atherosclerosis (ICD10-I70.0).     EKG: 10/08/2023: Sinus rhythm with marked sinus arrhythmia with 1st degree A-V block Inferior infarct , age undetermined Anteroseptal infarct , age undetermined Abnormal ECG When compared with ECG of 23-Jan-2023 14:29, No significant change since Confirmed by Delrae Rend [phone]) on 10/08/2023 8:36:36 PM   CV: Nuclear stress test 10/08/2023:   Findings are consistent with prior inferior/inferoseptal infarction. There is no current ischemia.  The study is low risk.   No ST deviation was noted.   LV perfusion is abnormal. There is a moderate size moderate intensity inferior/inferoseptal wall defect that is fixed.   Left ventricular function is normal. Nuclear stress EF: 54%. The left ventricular ejection fraction is normal (55-65%). End diastolic cavity size is mildly enlarged.     Echo 10/04/2023: IMPRESSIONS   1. Left ventricular ejection fraction, by estimation, is 55%. Left  ventricular ejection fraction by 3D volume is 55 %. The left ventricle has  normal function. Left ventricular endocardial border not optimally defined  to evaluate regional wall motion.  Left ventricular diastolic parameters are consistent with Grade I  diastolic dysfunction (impaired relaxation).   2. Right ventricular systolic function is normal. The right ventricular  size is normal. There is normal pulmonary artery systolic pressure.   3. The mitral valve is grossly normal. No evidence of mitral valve   regurgitation. No evidence of mitral stenosis.   4. The aortic valve is tricuspid. There is moderate calcification of the  aortic valve. There is moderate thickening of the aortic valve. Aortic  valve regurgitation is mild to moderate. Mild aortic valve stenosis.  Aortic regurgitation PHT measures 408  msec. Aortic valve mean gradient measures 11.8 mmHg. Aortic valve Vmax  measures 2.25 m/s.   5. Aortic dilatation noted. There is mild dilatation of the aortic root,  measuring 41 mm. There is mild dilatation of the ascending aorta,  measuring 44 mm.   6. The inferior vena cava is normal in size with greater than 50%  respiratory variability, suggesting right atrial pressure of 3 mmHg.  - Comparison(s): A prior study was performed on 09/20/2021. Changes from  prior study are noted. EF 55-60%. Mild aortic valve stenosis-mean pg 10.8  mmHg. Mild aortic regurgitation. Aortic dilatation noted.     Past Medical History:  Diagnosis Date   Aortic root dilatation (HCC)    Arthritis    Coronary atherosclerosis of native coronary artery    Multiple stents - RCA/LAD/diagonal, overlapping DES LAD, stent thrombosis 2005, ultimately CABG   Essential hypertension    H/O hiatal hernia    History of GI bleed    On nonsteroidals   Hyperlipemia    Myocardial infarction [facility])    Anterior with LAD stent thrombosis 2005, 2004 and 2005    Obesity    Paroxysmal atrial flutter (HCC)    Sleep apnea    CPAP daily    Past Surgical History:  Procedure Laterality Date   APPENDECTOMY     BREATH TEK H PYLORI  06/11/2012  Procedure: BREATH TEK H PYLORI;  Surgeon: Kandis Cocking, MD;  Location: [facility];  Service: General;  Laterality: N/ASue Lush   BRONCHIAL BIOPSY  09/11/2023   Procedure: BRONCHOSCOPY, WITH BIOPSY;  Surgeon: Leslye Peer, MD;  Location: [facility] ENDOSCOPY;  Service: Pulmonary;;   BRONCHIAL BRUSHINGS  09/11/2023   Procedure: BRONCHOSCOPY, WITH BRUSH BIOPSY;  Surgeon: Leslye Peer, MD;   [facility]: [facility];  Service: Pulmonary;;   BRONCHIAL NEEDLE ASPIRATION BIOPSY  09/11/2023   Procedure: BRONCHOSCOPY, WITH NEEDLE ASPIRATION BIOPSY;  Surgeon: Leslye Peer, MD;  [facility];  Service: Pulmonary;;   CARDIAC SURGERY     CARPAL TUNNEL RELEASE Right 12/02/2010   CARPAL TUNNEL RELEASE  06/06/2011   Procedure: CARPAL TUNNEL RELEASE;  Surgeon: Wyn Forster., MD;  Location: [facility];  Service: Orthopedics;  Laterality: Right;   CARPAL TUNNEL RELEASE Right 04/10/2013   Procedure: REVISION OF CARPAL TUNNEL RELEASE LIGAMENT RIGHT WRIST;  Surgeon: Wyn Forster., MD;  Location: [facility];  Service: Orthopedics;  Laterality: Right;   CERVICAL FUSION  03/03/2009   CHOLECYSTECTOMY     CORONARY ANGIOPLASTY WITH STENT PLACEMENT  [id number]   CORONARY ARTERY BYPASS GRAFT N/A 12/30/2013   Procedure: CORONARY ARTERY BYPASS GRAFTING (CABG) x three, using left internal mammary artery and right leg greater sapheneous vein harvested endoscopically - LIMA to LAD, SVG-D, SVG-OM1;  Surgeon: Kerin Perna, MD;  Location: [facility] OR;  Service: Open Heart Surgery;  Laterality: N/A;   ELBOW ARTHROSCOPY Right    FUDUCIAL PLACEMENT  09/11/2023   Procedure: INSERTION, FIDUCIAL MARKER, GOLD;  Surgeon: Leslye Peer, MD;  Location[facility];  Service: Pulmonary;;   IRRIGATION AND DEBRIDEMENT SHOULDER Right 04/30/2020   Procedure: Right shoulder poly exchange and possible glensphere exchange and left shoulder injection;  Surgeon: Beverely Low, MD;  Location: [facility];  Service: Orthopedics;  Laterality: Right;   LAPAROSCOPIC GASTRIC BANDING  08/31/2012   left carpal tunnel release      LEFT HEART CATHETERIZATION WITH CORONARY ANGIOGRAM N/A 12/26/2013   Procedure: LEFT HEART CATHETERIZATION WITH CORONARY ANGIOGRAM;  Surgeon: Marykay Lex, MD;  Location: [facility] C[facility];  Service: Cardiovascular;  Laterality: N/A;   REVERSE SHOULDER ARTHROPLASTY Right  01/30/2020   Procedure: REVERSE SHOULDER ARTHROPLASTY;  Surgeon: Beverely Low, MD;  Location: [facility];  Service: Orthopedics;  Laterality: Right;  interscalene block   REVERSE SHOULDER ARTHROPLASTY Left 03/30/2023   Procedure: REVERSE SHOULDER ARTHROPLASTY;  Surgeon: Beverely Low, MD;  Location: [facility];  Service: Orthopedics;  Laterality: Left;   SHOULDER ARTHROSCOPY Right 10/01/2009   TOTAL KNEE ARTHROPLASTY Right 07/03/2004   TOTAL KNEE ARTHROPLASTY Left 07/03/2005   ULNAR NERVE TRANSPOSITION  06/06/2011   Procedure: ULNAR NERVE DECOMPRESSION/TRANSPOSITION;  Surgeon: Wyn Forster., MD;  Location: [facility];  Service: Orthopedics;  Laterality: Right;  decompression ulnar nerve right cubital tunnel     MEDICATIONS:  aspirin 81 MG EC tablet   atorvastatin (LIPITOR) 20 MG tablet   b complex vitamins tablet   Cholecalciferol (VITAMIN D) 125 MCG (5000 UT) CAPS   Coenzyme Q10 (CO Q-10) 100 MG CAPS   cyclobenzaprine (FLEXERIL) 10 MG tablet   EPINEPHrine 0.3 mg/0.3 mL IJ SOAJ injection   Krill Oil 1000 MG CAPS   loratadine (CLARITIN) 10 MG tablet   losartan (COZAAR) 100 MG tablet   LYCOPENE PO   metoprolol (LOPRESSOR) 50 MG tablet   MOUNJARO 15 MG/0.5ML Pen  Multiple Vitamins-Minerals (EYE VITAMINS PO)   naproxen (NAPROSYN) 500 MG tablet   nitroGLYCERIN (NITROSTAT) 0.4 MG SL tablet   OLIVE LEAF EXTRACT PO   Pomegranate, Punica granatum, (POMEGRANATE PO)   Tadalafil 2.5 MG TABS   Turmeric 500 MG TABS   No current facility-administered medications for this encounter.    Shonna Chock, PA-C Surgical Short Stay/Anesthesiology [facility] Phone [phone] [facility] Phone [phone] 10/09/2023 4:16 PM

## 2023-10-09 NOTE — Anesthesia Preprocedure Evaluation (Addendum)
 Anesthesia Evaluation  Patient identified by MRN, date of birth, ID band Patient awake    Reviewed: Allergy & Precautions, NPO status , Patient's Chart, lab work & pertinent test results, reviewed documented beta blocker date and time   History of Anesthesia Complications Negative for: history of anesthetic complications  Airway Mallampati: II  TM Distance: >3 FB     Dental no notable dental hx.    Pulmonary sleep apnea    breath sounds clear to auscultation       Cardiovascular hypertension, + CAD, + Past MI, + Cardiac Stents and + CABG  + dysrhythmias Atrial Fibrillation  Rhythm:Regular Rate:Normal  Recent TTE, mild AS, mod AI, preserved BiV function   Neuro/Psych    GI/Hepatic hiatal hernia,,,(+) neg Cirrhosis        Endo/Other    Renal/GU Renal disease     Musculoskeletal  (+) Arthritis ,    Abdominal   Peds  Hematology   Anesthesia Other Findings   Reproductive/Obstetrics                             Anesthesia Physical Anesthesia Plan  ASA: 3  Anesthesia Plan: General   Post-op Pain Management:    Induction: Intravenous  PONV Risk Score and Plan: 2 and Ondansetron  and Dexamethasone   Airway Management Planned: Double Lumen EBT  Additional Equipment: ClearSight  Intra-op Plan:   Post-operative Plan:   Informed Consent: I have reviewed the patients History and Physical, chart, labs and discussed the procedure including the risks, benefits and alternatives for the proposed anesthesia with the patient or authorized representative who has indicated his/her understanding and acceptance.     Dental advisory given  Plan Discussed with: CRNA  Anesthesia Plan Comments: (PAT note written 10/24/2023 by Lundy Cozart, PA-C.  )       Anesthesia Quick Evaluation

## 2023-10-11 ENCOUNTER — Encounter: Payer: Self-pay | Admitting: *Deleted

## 2023-10-11 ENCOUNTER — Other Ambulatory Visit: Payer: Self-pay | Admitting: *Deleted

## 2023-10-11 DIAGNOSIS — R911 Solitary pulmonary nodule: Secondary | ICD-10-CM

## 2023-10-11 DIAGNOSIS — C3490 Malignant neoplasm of unspecified part of unspecified bronchus or lung: Secondary | ICD-10-CM

## 2023-10-11 NOTE — Progress Notes (Signed)
 Pt called and made aware of surgery cancellation.

## 2023-10-22 NOTE — Progress Notes (Signed)
 Surgical Instructions   Your procedure is scheduled on October 25, 2023. Report to [facility] Main Entrance "A" at 9:30 A.M., then check in with the Admitting office. Any questions or running late day of surgery: call [phone]  Questions prior to your surgery date: call [phone], Monday-Friday, 8am-4pm. If you experience any cold or flu symptoms such as cough, fever, chills, shortness of breath, etc. between now and your scheduled surgery, please notify us  at the above number.     Remember:  Do not eat or drink after midnight the night before your surgery       Take these medicines the morning of surgery with A SIP OF WATER   metoprolol  (LOPRESSOR )    May take these medicines IF NEEDED: cyclobenzaprine  (FLEXERIL )  EPINEPHrine  injection  loratadine  (CLARITIN )  nitroGLYCERIN  (NITROSTAT ) PLEASE CALL [phone] if taken day before surgery  MOUNJARO: LAST DOSE    One week prior to surgery, STOP taking any Aspirin  (unless otherwise instructed by your surgeon) Aleve , Naproxen , Ibuprofen, Motrin, Advil, Goody's, BC's, all herbal medications, fish oil, and non-prescription vitamins.   THIS INCLUDES YOUR  Krill Oil LYCOPENE naproxen  (NAPROSYN ) OLIVE LEAF EXTRACT Pomegranate, Punica granatum, (POMEGRANATE PO)   Turmeric, Coenzyme Q10, b complex vitamins.        Do NOT Smoke (Tobacco/Vaping) for 24 hours prior to your procedure.  If you use a CPAP at night, you may bring your mask/headgear for your overnight stay.   You will be asked to remove any contacts, glasses, piercing's, hearing aid's, dentures/partials prior to surgery. Please bring cases for these items if needed.    Patients discharged the day of surgery will not be allowed to drive home, and someone needs to stay with them for 24 hours.  SURGICAL WAITING ROOM VISITATION Patients may have no more than 2 support people in the waiting area - these visitors may rotate.   Pre-op nurse will coordinate an appropriate time  for 1 ADULT support person, who may not rotate, to accompany patient in pre-op.  Children under the age of 35 must have an adult with them who is not the patient and must remain in the main waiting area with an adult.  If the patient needs to stay at the hospital during part of their recovery, the visitor guidelines for inpatient rooms apply.  Please refer to the [facility] website for the visitor guidelines for any additional information.   If you received a COVID test during your pre-op visit  it is requested that you wear a mask when out in public, stay away from anyone that may not be feeling well and notify your surgeon if you develop symptoms. If you have been in contact with anyone that has tested positive in the last 10 days please notify you surgeon.      Pre-operative CHG Bathing Instructions   You can play a key role in reducing the risk of infection after surgery. Your skin needs to be as free of germs as possible. You can reduce the number of germs on your skin by washing with CHG (chlorhexidine  gluconate) soap before surgery. CHG is an antiseptic soap that kills germs and continues to kill germs even after washing.   DO NOT use if you have an allergy to chlorhexidine /CHG or antibacterial soaps. If your skin becomes reddened or irritated, stop using the CHG and notify one of our RNs at [phone].              TAKE A SHOWER THE NIGHT BEFORE  SURGERY AND THE DAY OF SURGERY    Please keep in mind the following:  DO NOT shave, including legs and underarms, 48 hours prior to surgery.   You may shave your face before/day of surgery.  Place clean sheets on your bed the night before surgery Use a clean washcloth (not used since being washed) for each shower. DO NOT sleep with pet's night before surgery.  CHG Shower Instructions:  Wash your face and private area with normal soap. If you choose to wash your hair, wash first with your normal shampoo.  After you use shampoo/soap,  rinse your hair and body thoroughly to remove shampoo/soap residue.  Turn the water  OFF and apply half the bottle of CHG soap to a CLEAN washcloth.  Apply CHG soap ONLY FROM YOUR NECK DOWN TO YOUR TOES (washing for 3-5 minutes)  DO NOT use CHG soap on face, private areas, open wounds, or sores.  Pay special attention to the area where your surgery is being performed.  If you are having back surgery, having someone wash your back for you may be helpful. Wait 2 minutes after CHG soap is applied, then you may rinse off the CHG soap.  Pat dry with a clean towel  Put on clean pajamas    Additional instructions for the day of surgery: DO NOT APPLY any lotions, deodorants, cologne, or perfumes.   Do not wear jewelry or makeup Do not wear nail polish, gel polish, artificial nails, or any other type of covering on natural nails (fingers and toes) Do not bring valuables to the hospital. [facility] is not responsible for valuables/personal belongings. Put on clean/comfortable clothes.  Please brush your teeth.  Ask your nurse before applying any prescription medications to the skin.

## 2023-10-23 ENCOUNTER — Encounter (HOSPITAL_COMMUNITY): Payer: Self-pay

## 2023-10-23 ENCOUNTER — Other Ambulatory Visit: Payer: Self-pay

## 2023-10-23 ENCOUNTER — Encounter (HOSPITAL_COMMUNITY)
Admission: RE | Admit: 2023-10-23 | Discharge: 2023-10-23 | Disposition: A | Discharge status: Home / Self Care | Source: Ambulatory Visit | Attending: Thoracic Surgery (Cardiothoracic Vascular Surgery) | Admitting: Thoracic Surgery (Cardiothoracic Vascular Surgery)

## 2023-10-23 ENCOUNTER — Ambulatory Visit (HOSPITAL_COMMUNITY)
Admission: RE | Admit: 2023-10-23 | Discharge: 2023-10-23 | Disposition: A | Discharge status: Home / Self Care | Source: Ambulatory Visit | Attending: Thoracic Surgery (Cardiothoracic Vascular Surgery) | Admitting: Thoracic Surgery (Cardiothoracic Vascular Surgery)

## 2023-10-23 VITALS — BP 156/97 | HR 75 | Temp 98.1°F | Resp 17 | Ht 66.0 in | Wt 234.0 lb

## 2023-10-23 DIAGNOSIS — Z902 Acquired absence of lung [part of]: Secondary | ICD-10-CM | POA: Diagnosis not present

## 2023-10-23 DIAGNOSIS — Z888 Allergy status to other drugs, medicaments and biological substances status: Secondary | ICD-10-CM | POA: Diagnosis not present

## 2023-10-23 DIAGNOSIS — I351 Nonrheumatic aortic (valve) insufficiency: Secondary | ICD-10-CM | POA: Insufficient documentation

## 2023-10-23 DIAGNOSIS — G4730 Sleep apnea, unspecified: Secondary | ICD-10-CM | POA: Diagnosis not present

## 2023-10-23 DIAGNOSIS — I2510 Atherosclerotic heart disease of native coronary artery without angina pectoris: Secondary | ICD-10-CM | POA: Diagnosis not present

## 2023-10-23 DIAGNOSIS — Z87891 Personal history of nicotine dependence: Secondary | ICD-10-CM | POA: Diagnosis not present

## 2023-10-23 DIAGNOSIS — E785 Hyperlipidemia, unspecified: Secondary | ICD-10-CM | POA: Insufficient documentation

## 2023-10-23 DIAGNOSIS — R911 Solitary pulmonary nodule: Secondary | ICD-10-CM | POA: Insufficient documentation

## 2023-10-23 DIAGNOSIS — Z7982 Long term (current) use of aspirin: Secondary | ICD-10-CM | POA: Diagnosis not present

## 2023-10-23 DIAGNOSIS — I10 Essential (primary) hypertension: Secondary | ICD-10-CM | POA: Insufficient documentation

## 2023-10-23 DIAGNOSIS — Z96611 Presence of right artificial shoulder joint: Secondary | ICD-10-CM | POA: Diagnosis not present

## 2023-10-23 DIAGNOSIS — R918 Other nonspecific abnormal finding of lung field: Secondary | ICD-10-CM | POA: Diagnosis not present

## 2023-10-23 DIAGNOSIS — J9382 Other air leak: Secondary | ICD-10-CM | POA: Diagnosis not present

## 2023-10-23 DIAGNOSIS — Z7985 Long-term (current) use of injectable non-insulin antidiabetic drugs: Secondary | ICD-10-CM | POA: Diagnosis not present

## 2023-10-23 DIAGNOSIS — C3431 Malignant neoplasm of lower lobe, right bronchus or lung: Secondary | ICD-10-CM | POA: Diagnosis not present

## 2023-10-23 DIAGNOSIS — Z96612 Presence of left artificial shoulder joint: Secondary | ICD-10-CM | POA: Diagnosis not present

## 2023-10-23 DIAGNOSIS — Z01818 Encounter for other preprocedural examination: Secondary | ICD-10-CM | POA: Diagnosis not present

## 2023-10-23 DIAGNOSIS — Z01812 Encounter for preprocedural laboratory examination: Secondary | ICD-10-CM | POA: Insufficient documentation

## 2023-10-23 DIAGNOSIS — Z833 Family history of diabetes mellitus: Secondary | ICD-10-CM | POA: Diagnosis not present

## 2023-10-23 DIAGNOSIS — C3490 Malignant neoplasm of unspecified part of unspecified bronchus or lung: Secondary | ICD-10-CM | POA: Insufficient documentation

## 2023-10-23 DIAGNOSIS — Z96653 Presence of artificial knee joint, bilateral: Secondary | ICD-10-CM | POA: Diagnosis not present

## 2023-10-23 DIAGNOSIS — Z955 Presence of coronary angioplasty implant and graft: Secondary | ICD-10-CM | POA: Diagnosis not present

## 2023-10-23 DIAGNOSIS — I7121 Aneurysm of the ascending aorta, without rupture: Secondary | ICD-10-CM | POA: Diagnosis not present

## 2023-10-23 DIAGNOSIS — I4892 Unspecified atrial flutter: Secondary | ICD-10-CM | POA: Diagnosis not present

## 2023-10-23 DIAGNOSIS — Z9884 Bariatric surgery status: Secondary | ICD-10-CM | POA: Diagnosis not present

## 2023-10-23 DIAGNOSIS — I252 Old myocardial infarction: Secondary | ICD-10-CM | POA: Diagnosis not present

## 2023-10-23 DIAGNOSIS — Z800 Family history of malignant neoplasm of digestive organs: Secondary | ICD-10-CM | POA: Diagnosis not present

## 2023-10-23 DIAGNOSIS — Z91030 Bee allergy status: Secondary | ICD-10-CM | POA: Diagnosis not present

## 2023-10-23 DIAGNOSIS — Z951 Presence of aortocoronary bypass graft: Secondary | ICD-10-CM | POA: Diagnosis not present

## 2023-10-23 LAB — COMPREHENSIVE METABOLIC PANEL WITH GFR
ALT: 21 U/L (ref 0–44)
AST: 22 U/L (ref 15–41)
Albumin: 4 g/dL (ref 3.5–5.0)
Alkaline Phosphatase: 41 U/L (ref 38–126)
Anion gap: 11 (ref 5–15)
BUN: 15 mg/dL (ref 8–23)
CO2: 22 mmol/L (ref 22–32)
Calcium: 9.3 mg/dL (ref 8.9–10.3)
Chloride: 104 mmol/L (ref 98–111)
Creatinine, Ser: 0.84 mg/dL (ref 0.61–1.24)
GFR, Estimated: >60 mL/min
Glucose, Bld: 93 mg/dL (ref 70–99)
Potassium: 4.1 mmol/L (ref 3.5–5.1)
Sodium: 137 mmol/L (ref 135–145)
Total Bilirubin: 0.7 mg/dL (ref 0.0–1.2)
Total Protein: 7 g/dL (ref 6.5–8.1)

## 2023-10-23 LAB — CBC
HCT: 44.8 % (ref 39.0–52.0)
Hemoglobin: 15.1 g/dL (ref 13.0–17.0)
MCH: 30.8 pg (ref 26.0–34.0)
MCHC: 33.7 g/dL (ref 30.0–36.0)
MCV: 91.4 fL (ref 80.0–100.0)
Platelets: 207 K/uL (ref 150–400)
RBC: 4.9 MIL/uL (ref 4.22–5.81)
RDW: 13.3 % (ref 11.5–15.5)
WBC: 5.8 K/uL (ref 4.0–10.5)
nRBC: 0 % (ref 0.0–0.2)

## 2023-10-23 LAB — PROTIME-INR
INR: 1 (ref 0.8–1.2)
Prothrombin Time: 13.4 s (ref 11.4–15.2)

## 2023-10-23 LAB — APTT: aPTT: 30 s (ref 24–36)

## 2023-10-23 LAB — TYPE AND SCREEN
ABO/RH(D): A POS
Antibody Screen: NEGATIVE

## 2023-10-23 LAB — SURGICAL PCR SCREEN
MRSA, PCR: NEGATIVE
Staphylococcus aureus: NEGATIVE

## 2023-10-23 NOTE — Progress Notes (Signed)
 PCP - Vallarie Gauze Cardiologist - Teddie Favre  PPM/ICD - Denies Device Orders - n/a Rep Notified - n/a  Chest x-ray - 10-23-23 EKG - 10-23-23 Stress Test - 10-08-23 ECHO - 10-04-23 Cardiac Cath - 2015  Sleep Study - Yes CPAP - uses every night  Non-diabetic -   Last dose of GLP1 agonist- Mounjaro   GLP1 instructions: Last dose taken on 10-11-23  Blood Thinner Instructions: Denies Aspirin  Instructions:continue per patient  ERAS Protcol - NPO PRE-SURGERY Ensure or G2- none  COVID TEST- n/a   Anesthesia review: yes, cardiac clearance  Patient denies shortness of breath, fever, cough and chest pain at PAT appointment. Patient refuses any respiratory issues at this time.   Patient was unable to urinate as he stated " I went right before I came back here". Will collect on day of surgery.   All instructions explained to the patient, with a verbal understanding of the material. Patient agrees to go over the instructions while at home for a better understanding. Patient also instructed to self quarantine after being tested for COVID-19. The opportunity to ask questions was provided.

## 2023-10-23 NOTE — Progress Notes (Addendum)
 Surgical Instructions     Your procedure is scheduled on October 25, 2023. Report to [facility] Main Entrance "A" at 9:30 A.M., then check in with the Admitting office. Any questions or running late day of surgery: call [phone]   Questions prior to your surgery date: call [phone], Monday-Friday, 8am-4pm. If you experience any cold or flu symptoms such as cough, fever, chills, shortness of breath, etc. between now and your scheduled surgery, please notify us  at the above number.            Remember:       Do not eat or drink after midnight the night before your surgery           Take these medicines the morning of surgery with A SIP OF WATER   Aspirin  81 mg metoprolol  (LOPRESSOR )      May take these medicines IF NEEDED: cyclobenzaprine  (FLEXERIL )  EPINEPHrine  injection  loratadine  (CLARITIN )  nitroGLYCERIN  (NITROSTAT ) PLEASE CALL [phone] if taken day before surgery   MOUNJARO: LAST DOSE should be taken on or before Wednesday, April 16th.     One week prior to surgery, STOP taking any Aspirin  (unless otherwise instructed by your surgeon) Aleve , Naproxen , Ibuprofen, Motrin, Advil, Goody's, BC's, all herbal medications, fish oil, and non-prescription vitamins.   THIS INCLUDES YOUR  Krill Oil LYCOPENE naproxen  (NAPROSYN ) OLIVE LEAF EXTRACT Pomegranate, Punica granatum, (POMEGRANATE PO)   Turmeric, Coenzyme Q10, b complex vitamins.         Do NOT Smoke (Tobacco/Vaping) for 24 hours prior to your procedure.   If you use a CPAP at night, you may bring your mask/headgear for your overnight stay.   You will be asked to remove any contacts, glasses, piercing's, hearing aid's, dentures/partials prior to surgery. Please bring cases for these items if needed.    Patients discharged the day of surgery will not be allowed to drive home, and someone needs to stay with them for 24 hours.   SURGICAL WAITING ROOM VISITATION Patients may have no more than 2 support people in the  waiting area - these visitors may rotate.   Pre-op nurse will coordinate an appropriate time for 1 ADULT support person, who may not rotate, to accompany patient in pre-op.  Children under the age of 34 must have an adult with them who is not the patient and must remain in the main waiting area with an adult.   If the patient needs to stay at the hospital during part of their recovery, the visitor guidelines for inpatient rooms apply.   Please refer to the [facility] website for the visitor guidelines for any additional information.     If you received a COVID test during your pre-op visit  it is requested that you wear a mask when out in public, stay away from anyone that may not be feeling well and notify your surgeon if you develop symptoms. If you have been in contact with anyone that has tested positive in the last 10 days please notify you surgeon.         Pre-operative CHG Bathing Instructions    You can play a key role in reducing the risk of infection after surgery. Your skin needs to be as free of germs as possible. You can reduce the number of germs on your skin by washing with CHG (chlorhexidine  gluconate) soap before surgery. CHG is an antiseptic soap that kills germs and continues to kill germs even after washing.    DO NOT use if  you have an allergy to chlorhexidine /CHG or antibacterial soaps. If your skin becomes reddened or irritated, stop using the CHG and notify one of our RNs at [phone].               TAKE A SHOWER THE NIGHT BEFORE SURGERY AND THE DAY OF SURGERY     Please keep in mind the following:  DO NOT shave, including legs and underarms, 48 hours prior to surgery.   You may shave your face before/day of surgery.  Place clean sheets on your bed the night before surgery Use a clean washcloth (not used since being washed) for each shower. DO NOT sleep with pet's night before surgery.   CHG Shower Instructions:  Wash your face and private area with normal  soap. If you choose to wash your hair, wash first with your normal shampoo.  After you use shampoo/soap, rinse your hair and body thoroughly to remove shampoo/soap residue.  Turn the water  OFF and apply half the bottle of CHG soap to a CLEAN washcloth.  Apply CHG soap ONLY FROM YOUR NECK DOWN TO YOUR TOES (washing for 3-5 minutes)  DO NOT use CHG soap on face, private areas, open wounds, or sores.  Pay special attention to the area where your surgery is being performed.  If you are having back surgery, having someone wash your back for you may be helpful. Wait 2 minutes after CHG soap is applied, then you may rinse off the CHG soap.  Pat dry with a clean towel  Put on clean pajamas     Additional instructions for the day of surgery: DO NOT APPLY any lotions, deodorants, cologne, or perfumes.   Do not wear jewelry or makeup Do not wear nail polish, gel polish, artificial nails, or any other type of covering on natural nails (fingers and toes) Do not bring valuables to the hospital. [facility] is not responsible for valuables/personal belongings. Put on clean/comfortable clothes.  Please brush your teeth.  Ask your nurse before applying any prescription medications to the skin

## 2023-10-24 NOTE — Progress Notes (Signed)
 Anesthesia Chart Review:  Case: [id number] Date/Time: 10/25/23 1117   Procedure: LOBECTOMY, LUNG, ROBOT-ASSISTED, USING VATS (Right: Chest) - RIGHT ROBOTIC ASSISTED THORACOSCOPY FOR RIGHT LOWER LOBECTOMY   Anesthesia type: General   Diagnosis: Malignant neoplasm of lung, unspecified laterality, unspecified part of lung (HCC) [C34.90]   Pre-op diagnosis: lung cancer   [facility] / [facility]   Surgeons: Hilarie Lovely, MD       DISCUSSION: Patient is a 73 year old male scheduled for the above procedure. Surgery was rescheduled for 10/11/23.   History includes never smoker  (former smokeless tobacco, quit 2004), HTN, CAD (multiple stents with MI ~ 2004/ & 2005 for LAD stent thrombosis; s/p CABG: LIMA-LAD, SVG-DIAG, SVG-OM1 12/30/2013), Aortic valve disease (mild-moderate AR, mild AS 10/2023 TTE), aortic root dilation /TAA (41 mm  root, ascending aorta 44 mm4/2025), paroxysmal aflutter (~ 2015, not on anticoagulation), OSA (uses CPAP), bariatric surgery (laparoscopic adjustable gastric band  09/10/22), osteoarthritis (right reverse TSA 01/30/20; left reverse TSA 03/30/23), lung cancer (09/11/23 bronchoscopy RLL adenocarcinoma).     For anesthesia history, he reported that for 01/30/2020 reverse TSA, he failed block and phrenic nerve palsy and difficulty breathing for weeks after procedure      Last cardiology visit was on 10/02/23 with Dr. Londa Rival for follow-up CAD, HTN, HLD, TAA and for preoperative cardiology evaluation. He wrote: Preoperative cardiac evaluation. He recommended a preoperative stress test and echocardiogram. 10/04/23 TTE showed LVEF 55%, endocardial border not optimally defined to evaluate regional wall motion, grade 1 diastolic dysfunction, normal RV systolic function, normal PASP, moderate AV calcification with mild to moderate AR, mild AS with mean gradient 11.8 mmHg, aortic root 41 mm, ascending thoracic aorta 44 mm.  10/08/2023 nuclear stress test showed findings consistent  with prior inferior/inferoseptal infarct, no current ischemia, EF 55 to 65%.   He was evaluated by Dr. Deloise Ferries on 10/05/23. He was instructed by TCTS to hold Mounjaro for 1 week prior to surgery, last dose 10/11/23.   Anesthesia team to evaluate on the day of surgery. UA on 10/08/23 was normal, if needs repeat per surgeon then would need to get day of surgery.     VS: BP (!) 156/97   Pulse 75   Temp 36.7 C   Resp 17   Ht 5\' 6"  (1.676 m)   Wt 106.1 kg   SpO2 98%   BMI 37.77 kg/m   PROVIDERS: [name], [name], MD is PCP  Teddie Favre, MD is cardiologist  Racheal Buddle, MD is pulmonologist   LABS: Labs reviewed: Acceptable for surgery. (all labs ordered are listed, but only abnormal results are displayed)  Labs Reviewed  SURGICAL PCR SCREEN  CBC  COMPREHENSIVE METABOLIC PANEL WITH GFR  PROTIME-INR  APTT  TYPE AND SCREEN    PFTs 10/03/23: FVC 2.77 (75%), post 2.83 (77%). FEV1 2.20 (83%), post 2.21 (83%). DLCO unc 21.01 (93%), cor 20.67 (92%).     IMAGES: CXR 10/23/23: IMPRESSION: 1. No acute chest findings. 2. Right lower lobe nodular opacity on prior CT is not well seen on the current exam, fiducial marker in this region.   MRI Brain 10/01/23:  IMPRESSION: No evidence of intracranial metastatic disease.  CT Super D Chest 09/05/23: IMPRESSION: 1. Redemonstrated mixed solid and ground-glass composition subpleural nodular opacity of the paramedian right lower lobe measuring 3.3 x 1.7 cm. This is clearly increased in size and solid character when compared to prior diagnostic chest CT dated 05/18/2023, perhaps slightly increased in size when compared  to free breathing PET-CT examination dated 08/14/2023. Despite lack of FDG avidity on prior examination, appearance and behavior are worrisome for adenocarcinoma. Infectious or inflammatory etiology does however remain a significant differential consideration. 2. No evidence of lymphadenopathy or metastatic disease in  the chest. 3. Scattered tiny calcified bilateral pleural plaques, suggesting prior asbestos exposure, although conceivably related to prior hemothorax in the setting of median sternotomy. 4. Coronary artery disease status post median sternotomy and CABG. Chronic sternal nonunion and wire fractures. 5. Hepatic steatosis.  - Aortic Atherosclerosis (ICD10-I70.0).   PET Scan 08/14/23: MPRESSION: 1. The subpleural pulmonary nodule of concern inferomedially in the right lower lobe demonstrates continued growth and is increasingly solid but demonstrates no significant hypermetabolic activity. Findings remain suspicious for possible slow growing adenocarcinoma. Consider tissue sampling. 2. No hypermetabolic activity identified within the neck, chest, abdomen or pelvis. 3. No evidence of metastatic disease. 4.  Aortic Atherosclerosis (ICD10-I70.0).       EKG: 10/08/2023: Sinus rhythm with marked sinus arrhythmia with 1st degree A-V block Inferior infarct , age undetermined Anteroseptal infarct , age undetermined Abnormal ECG When compared with ECG of 23-Jan-2023 14:29, No significant change since Confirmed by Federico Gouveia [phone]) on 10/08/2023 8:36:36 PM     CV: Nuclear stress test 10/08/2023:   Findings are consistent with prior inferior/inferoseptal infarction. There is no current ischemia.  The study is low risk.   No ST deviation was noted.   LV perfusion is abnormal. There is a moderate size moderate intensity inferior/inferoseptal wall defect that is fixed.   Left ventricular function is normal. Nuclear stress EF: 54%. The left ventricular ejection fraction is normal (55-65%). End diastolic cavity size is mildly enlarged.       Echo 10/04/2023: IMPRESSIONS   1. Left ventricular ejection fraction, by estimation, is 55%. Left  ventricular ejection fraction by 3D volume is 55 %. The left ventricle has  normal function. Left ventricular endocardial border not optimally defined  to  evaluate regional wall motion.  Left ventricular diastolic parameters are consistent with Grade I  diastolic dysfunction (impaired relaxation).   2. Right ventricular systolic function is normal. The right ventricular  size is normal. There is normal pulmonary artery systolic pressure.   3. The mitral valve is grossly normal. No evidence of mitral valve  regurgitation. No evidence of mitral stenosis.   4. The aortic valve is tricuspid. There is moderate calcification of the  aortic valve. There is moderate thickening of the aortic valve. Aortic  valve regurgitation is mild to moderate. Mild aortic valve stenosis.  Aortic regurgitation PHT measures 408  msec. Aortic valve mean gradient measures 11.8 mmHg. Aortic valve Vmax  measures 2.25 m/s.   5. Aortic dilatation noted. There is mild dilatation of the aortic root,  measuring 41 mm. There is mild dilatation of the ascending aorta,  measuring 44 mm.   6. The inferior vena cava is normal in size with greater than 50%  respiratory variability, suggesting right atrial pressure of 3 mmHg.  - Comparison(s): A prior study was performed on 09/20/2021. Changes from  prior study are noted. EF 55-60%. Mild aortic valve stenosis-mean pg 10.8  mmHg. Mild aortic regurgitation. Aortic dilatation noted.    Past Medical History:  Diagnosis Date   Aortic root dilatation (HCC)    Arthritis    Coronary atherosclerosis of native coronary artery    Multiple stents - RCA/LAD/diagonal, overlapping DES LAD, stent thrombosis 2005, ultimately CABG   Essential hypertension    H/O hiatal  hernia    History of GI bleed    On nonsteroidals   Hyperlipemia    Myocardial infarction [facility])    Anterior with LAD stent thrombosis 2005, 2004 and 2005    Obesity    Paroxysmal atrial flutter (HCC)    Sleep apnea    CPAP daily    Past Surgical History:  Procedure Laterality Date   APPENDECTOMY     BREATH TEK H PYLORI  06/11/2012   Procedure: BREATH TEK H PYLORI;   Surgeon: Thayne Fine, MD;  Location: [facility];  Service: General;  Laterality: N/ACain Castillo   BRONCHIAL BIOPSY  09/11/2023   Procedure: BRONCHOSCOPY, WITH BIOPSY;  Surgeon: Denson Flake, MD;  Location: [facility] END[facility]COPY;  Service: Pulmonary;;   BRONCHIAL BRUSHINGS  09/11/2023   Procedure: BRONCHOSCOPY, WITH BRUSH BIOPSY;  Surgeon: Denson Flake, MD;  Location: [facility];  Service: Pulmonary;;   BRONCHIAL NEEDLE ASPIRATION BIOPSY  09/11/2023   Procedure: BRONCHOSCOPY, WITH NEEDLE ASPIRATION BIOPSY;  Surgeon: Denson Flake, MD;  Location: [facility];  Service: Pulmonary;;   CARDIAC SURGERY     CARPAL TUNNEL RELEASE Right 12/02/2010   CARPAL TUNNEL RELEASE  06/06/2011   Procedure: CARPAL TUNNEL RELEASE;  Surgeon: Amelie Baize., MD;  Location: [facility];  Service: Orthopedics;  Laterality: Right;   CARPAL TUNNEL RELEASE Right 04/10/2013   Procedure: REVISION OF CARPAL TUNNEL RELEASE LIGAMENT RIGHT WRIST;  Surgeon: Amelie Baize., MD;  Location: [facility];  Service: Orthopedics;  Laterality: Right;   CERVICAL FUSION  03/03/2009   CHOLECYSTECTOMY     CORONARY ANGIOPLASTY WITH STENT PLACEMENT  [phone]   CORONARY ARTERY BYPASS GRAFT N/A 12/30/2013   Procedure: CORONARY ARTERY BYPASS GRAFTING (CABG) x three, using left internal mammary artery and right leg greater sapheneous vein harvested endoscopically - LIMA to LAD, SVG-D, SVG-OM1;  Surgeon: Heriberto London, MD;  Location: [facility] OR;  Service: Open Heart Surgery;  Laterality: N/A;   ELBOW ARTHROSCOPY Right    FUDUCIAL PLACEMENT  09/11/2023   Procedure: INSERTION, FIDUCIAL MARKER, GOLD;  Surgeon: Denson Flake, MD;  Location: [facility];  Service: Pulmonary;;   IRRIGATION AND DEBRIDEMENT SHOULDER Right 04/30/2020   Procedure: Right shoulder poly exchange and possible glensphere exchange and left shoulder injection;  Surgeon: Winston Hawking, MD;  Location: [facility];  Service: Orthopedics;  Laterality:  Right;   LAPAROSCOPIC GASTRIC BANDING  08/31/2012   left carpal tunnel release      LEFT HEART CATHETERIZATION WITH CORONARY ANGIOGRAM N/A 12/26/2013   Procedure: LEFT HEART CATHETERIZATION WITH CORONARY ANGIOGRAM;  Surgeon: Arleen Lacer, MD;  Location: [facility] C[facility];  Service: Cardiovascular;  Laterality: N/A;   REVERSE SHOULDER ARTHROPLASTY Right 01/30/2020   Procedure: REVERSE SHOULDER ARTHROPLASTY;  Surgeon: Winston Hawking, MD;  Location: [facility];  Service: Orthopedics;  Laterality: Right;  interscalene block   REVERSE SHOULDER ARTHROPLASTY Left 03/30/2023   Procedure: REVERSE SHOULDER ARTHROPLASTY;  Surgeon: Winston Hawking, MD;  Location: [facility];  Service: Orthopedics;  Laterality: Left;   SHOULDER ARTHROSCOPY Right 10/01/2009   TOTAL KNEE ARTHROPLASTY Right 07/03/2004   TOTAL KNEE ARTHROPLASTY Left 07/03/2005   ULNAR NERVE TRANSPOSITION  06/06/2011   Procedure: ULNAR NERVE DECOMPRESSION/TRANSPOSITION;  Surgeon: Amelie Baize., MD;  Location: [facility];  Service: Orthopedics;  Laterality: Right;  decompression ulnar nerve right cubital tunnel     MEDICATIONS:  aspirin  81 MG EC tablet   atorvastatin  (LIPITOR) 20 MG tablet  b complex vitamins tablet   Cholecalciferol  (VITAMIN D ) 125 MCG (5000 UT) CAPS   Coenzyme Q10 (CO Q-10) 100 MG CAPS   cyclobenzaprine  (FLEXERIL ) 10 MG tablet   EPINEPHrine  0.3 mg/0.3 mL IJ SOAJ injection   Krill Oil 1000 MG CAPS   loratadine  (CLARITIN ) 10 MG tablet   losartan  (COZAAR ) 100 MG tablet   LYCOPENE PO   metoprolol  (LOPRESSOR ) 50 MG tablet   MOUNJARO 15 MG/0.5ML Pen   Multiple Vitamins-Minerals (EYE VITAMINS PO)   naproxen  (NAPROSYN ) 500 MG tablet   nitroGLYCERIN  (NITROSTAT ) 0.4 MG SL tablet   OLIVE LEAF EXTRACT PO   Pomegranate, Punica granatum, (POMEGRANATE PO)   Tadalafil  2.5 MG TABS   Turmeric 500 MG TABS   No current facility-administered medications for this encounter.    Ella Gun, PA-C Surgical Short  Stay/Anesthesiology [facility] Phone [phone] [facility] Phone [phone] 10/24/2023 10:05 AM

## 2023-10-25 ENCOUNTER — Encounter (HOSPITAL_COMMUNITY)
Admission: RE | Disposition: A | Discharge status: Home / Self Care | Payer: Self-pay | Source: Home / Self Care | Attending: Thoracic Surgery (Cardiothoracic Vascular Surgery)

## 2023-10-25 ENCOUNTER — Inpatient Hospital Stay (HOSPITAL_COMMUNITY): Payer: Self-pay | Admitting: Vascular Surgery

## 2023-10-25 ENCOUNTER — Inpatient Hospital Stay (HOSPITAL_COMMUNITY): Payer: Self-pay | Admitting: Anesthesiology

## 2023-10-25 ENCOUNTER — Encounter (HOSPITAL_COMMUNITY): Payer: Self-pay | Admitting: Thoracic Surgery (Cardiothoracic Vascular Surgery)

## 2023-10-25 ENCOUNTER — Inpatient Hospital Stay (HOSPITAL_COMMUNITY)
Admission: RE | Admit: 2023-10-25 | Discharge: 2023-10-26 | DRG: 164 | Disposition: A | Discharge status: Home / Self Care | Attending: Thoracic Surgery (Cardiothoracic Vascular Surgery) | Admitting: Thoracic Surgery (Cardiothoracic Vascular Surgery)

## 2023-10-25 ENCOUNTER — Inpatient Hospital Stay (HOSPITAL_COMMUNITY)

## 2023-10-25 DIAGNOSIS — Z955 Presence of coronary angioplasty implant and graft: Secondary | ICD-10-CM | POA: Diagnosis not present

## 2023-10-25 DIAGNOSIS — J9811 Atelectasis: Secondary | ICD-10-CM | POA: Diagnosis not present

## 2023-10-25 DIAGNOSIS — Z9884 Bariatric surgery status: Secondary | ICD-10-CM

## 2023-10-25 DIAGNOSIS — Z833 Family history of diabetes mellitus: Secondary | ICD-10-CM | POA: Diagnosis not present

## 2023-10-25 DIAGNOSIS — Z7985 Long-term (current) use of injectable non-insulin antidiabetic drugs: Secondary | ICD-10-CM

## 2023-10-25 DIAGNOSIS — I2510 Atherosclerotic heart disease of native coronary artery without angina pectoris: Secondary | ICD-10-CM

## 2023-10-25 DIAGNOSIS — Z951 Presence of aortocoronary bypass graft: Secondary | ICD-10-CM

## 2023-10-25 DIAGNOSIS — J9382 Other air leak: Secondary | ICD-10-CM | POA: Diagnosis not present

## 2023-10-25 DIAGNOSIS — Z800 Family history of malignant neoplasm of digestive organs: Secondary | ICD-10-CM | POA: Diagnosis not present

## 2023-10-25 DIAGNOSIS — G4730 Sleep apnea, unspecified: Secondary | ICD-10-CM | POA: Diagnosis present

## 2023-10-25 DIAGNOSIS — I252 Old myocardial infarction: Secondary | ICD-10-CM | POA: Diagnosis not present

## 2023-10-25 DIAGNOSIS — Z91030 Bee allergy status: Secondary | ICD-10-CM | POA: Diagnosis not present

## 2023-10-25 DIAGNOSIS — I4892 Unspecified atrial flutter: Secondary | ICD-10-CM | POA: Diagnosis present

## 2023-10-25 DIAGNOSIS — Z96612 Presence of left artificial shoulder joint: Secondary | ICD-10-CM | POA: Diagnosis not present

## 2023-10-25 DIAGNOSIS — Z888 Allergy status to other drugs, medicaments and biological substances status: Secondary | ICD-10-CM | POA: Diagnosis not present

## 2023-10-25 DIAGNOSIS — I7121 Aneurysm of the ascending aorta, without rupture: Secondary | ICD-10-CM | POA: Diagnosis present

## 2023-10-25 DIAGNOSIS — T797XXA Traumatic subcutaneous emphysema, initial encounter: Secondary | ICD-10-CM | POA: Diagnosis not present

## 2023-10-25 DIAGNOSIS — C3491 Malignant neoplasm of unspecified part of right bronchus or lung: Secondary | ICD-10-CM

## 2023-10-25 DIAGNOSIS — Z6838 Body mass index (BMI) 38.0-38.9, adult: Secondary | ICD-10-CM | POA: Diagnosis not present

## 2023-10-25 DIAGNOSIS — E669 Obesity, unspecified: Secondary | ICD-10-CM | POA: Diagnosis present

## 2023-10-25 DIAGNOSIS — Z471 Aftercare following joint replacement surgery: Secondary | ICD-10-CM | POA: Diagnosis not present

## 2023-10-25 DIAGNOSIS — Z87891 Personal history of nicotine dependence: Secondary | ICD-10-CM | POA: Diagnosis not present

## 2023-10-25 DIAGNOSIS — Z9889 Other specified postprocedural states: Principal | ICD-10-CM | POA: Diagnosis present

## 2023-10-25 DIAGNOSIS — Z48813 Encounter for surgical aftercare following surgery on the respiratory system: Secondary | ICD-10-CM | POA: Diagnosis not present

## 2023-10-25 DIAGNOSIS — I10 Essential (primary) hypertension: Secondary | ICD-10-CM

## 2023-10-25 DIAGNOSIS — C3490 Malignant neoplasm of unspecified part of unspecified bronchus or lung: Secondary | ICD-10-CM | POA: Diagnosis not present

## 2023-10-25 DIAGNOSIS — E785 Hyperlipidemia, unspecified: Secondary | ICD-10-CM | POA: Diagnosis present

## 2023-10-25 DIAGNOSIS — Z96653 Presence of artificial knee joint, bilateral: Secondary | ICD-10-CM | POA: Diagnosis present

## 2023-10-25 DIAGNOSIS — Z96611 Presence of right artificial shoulder joint: Secondary | ICD-10-CM | POA: Diagnosis not present

## 2023-10-25 DIAGNOSIS — Z7982 Long term (current) use of aspirin: Secondary | ICD-10-CM | POA: Diagnosis not present

## 2023-10-25 DIAGNOSIS — Z79899 Other long term (current) drug therapy: Secondary | ICD-10-CM

## 2023-10-25 DIAGNOSIS — C3431 Malignant neoplasm of lower lobe, right bronchus or lung: Principal | ICD-10-CM | POA: Diagnosis present

## 2023-10-25 DIAGNOSIS — Z902 Acquired absence of lung [part of]: Secondary | ICD-10-CM

## 2023-10-25 DIAGNOSIS — I4891 Unspecified atrial fibrillation: Secondary | ICD-10-CM | POA: Diagnosis not present

## 2023-10-25 DIAGNOSIS — Z981 Arthrodesis status: Secondary | ICD-10-CM

## 2023-10-25 DIAGNOSIS — R911 Solitary pulmonary nodule: Secondary | ICD-10-CM

## 2023-10-25 HISTORY — PX: LYMPH NODE BIOPSY: SHX201

## 2023-10-25 HISTORY — PX: INTERCOSTAL NERVE BLOCK: SHX5021

## 2023-10-25 HISTORY — PX: LOBECTOMY, LUNG, ROBOT-ASSISTED, USING VATS: SHX7607

## 2023-10-25 SURGERY — LOBECTOMY, LUNG, ROBOT-ASSISTED, USING VATS
Anesthesia: General | Site: Chest | Laterality: Right

## 2023-10-25 MED ORDER — SODIUM CHLORIDE FLUSH 0.9 % IV SOLN: INTRAVENOUS | Status: DC | PRN

## 2023-10-25 MED ORDER — BUPIVACAINE LIPOSOME 1.3 % IJ SUSP
INTRAMUSCULAR | Status: AC
  Filled 2023-10-25: qty 20

## 2023-10-25 MED ORDER — ACETAMINOPHEN 500 MG PO TABS
1000.0000 mg | ORAL_TABLET | Freq: Four times a day (QID) | ORAL | Status: DC
  Administered 2023-10-25 – 2023-10-26 (×3): 1000 mg via ORAL
  Filled 2023-10-25 (×3): qty 2

## 2023-10-25 MED ORDER — OXYCODONE HCL 5 MG PO TABS: 5.0000 mg | ORAL_TABLET | Freq: Once | ORAL | Status: DC | PRN

## 2023-10-25 MED ORDER — BUPIVACAINE HCL (PF) 0.5 % IJ SOLN
INTRAMUSCULAR | Status: AC
  Filled 2023-10-25: qty 30

## 2023-10-25 MED ORDER — CHLORHEXIDINE GLUCONATE 0.12 % MT SOLN
OROMUCOSAL | Status: DC
Start: 2023-10-25 — End: 2023-10-25
  Filled 2023-10-25: qty 15

## 2023-10-25 MED ORDER — ONDANSETRON HCL 4 MG/2ML IJ SOLN
INTRAMUSCULAR | Status: DC | PRN
  Administered 2023-10-25: 4 mg via INTRAVENOUS

## 2023-10-25 MED ORDER — ALBUMIN HUMAN 5 % IV SOLN: INTRAVENOUS | Status: DC | PRN

## 2023-10-25 MED ORDER — GLYCOPYRROLATE PF 0.2 MG/ML IJ SOSY
PREFILLED_SYRINGE | INTRAMUSCULAR | Status: DC | PRN
  Administered 2023-10-25: .2 mg via INTRAVENOUS

## 2023-10-25 MED ORDER — 0.9 % SODIUM CHLORIDE (POUR BTL) OPTIME
TOPICAL | Status: DC | PRN
  Administered 2023-10-25: 1000 mL

## 2023-10-25 MED ORDER — BISACODYL 5 MG PO TBEC
10.0000 mg | DELAYED_RELEASE_TABLET | Freq: Every day | ORAL | Status: DC
  Filled 2023-10-25: qty 2

## 2023-10-25 MED ORDER — METOPROLOL TARTRATE 50 MG PO TABS
50.0000 mg | ORAL_TABLET | Freq: Two times a day (BID) | ORAL | Status: DC
  Administered 2023-10-25 – 2023-10-26 (×2): 50 mg via ORAL
  Filled 2023-10-25 (×2): qty 1

## 2023-10-25 MED ORDER — SUCCINYLCHOLINE CHLORIDE 200 MG/10ML IV SOSY
PREFILLED_SYRINGE | INTRAVENOUS | Status: AC
  Filled 2023-10-25: qty 10

## 2023-10-25 MED ORDER — POLYMYXIN B-TRIMETHOPRIM 10000-0.1 UNIT/ML-% OP SOLN
1.0000 [drp] | Freq: Four times a day (QID) | OPHTHALMIC | Status: AC
  Administered 2023-10-25 – 2023-10-26 (×4): 1 [drp] via OPHTHALMIC
  Filled 2023-10-25: qty 10

## 2023-10-25 MED ORDER — BSS IO SOLN
15.0000 mL | Freq: Once | INTRAOCULAR | Status: AC
  Administered 2023-10-25: 15 mL

## 2023-10-25 MED ORDER — FENTANYL CITRATE (PF) 250 MCG/5ML IJ SOLN
INTRAMUSCULAR | Status: AC
  Filled 2023-10-25: qty 5

## 2023-10-25 MED ORDER — ROCURONIUM BROMIDE 10 MG/ML (PF) SYRINGE
PREFILLED_SYRINGE | INTRAVENOUS | Status: AC
  Filled 2023-10-25: qty 10

## 2023-10-25 MED ORDER — TRAMADOL HCL 50 MG PO TABS: 50.0000 mg | ORAL_TABLET | Freq: Four times a day (QID) | ORAL | Status: DC | PRN

## 2023-10-25 MED ORDER — KETOROLAC TROMETHAMINE 0.5 % OP SOLN
1.0000 [drp] | Freq: Four times a day (QID) | OPHTHALMIC | Status: AC
  Administered 2023-10-25 – 2023-10-26 (×4): 1 [drp] via OPHTHALMIC

## 2023-10-25 MED ORDER — ACETAMINOPHEN 10 MG/ML IV SOLN
INTRAVENOUS | Status: DC | PRN
  Administered 2023-10-25: 1000 mg via INTRAVENOUS

## 2023-10-25 MED ORDER — ACETAMINOPHEN 160 MG/5ML PO SOLN: 1000.0000 mg | Freq: Four times a day (QID) | ORAL | Status: DC

## 2023-10-25 MED ORDER — KETOROLAC TROMETHAMINE 15 MG/ML IJ SOLN
15.0000 mg | Freq: Four times a day (QID) | INTRAMUSCULAR | Status: DC
  Administered 2023-10-25 – 2023-10-26 (×4): 15 mg via INTRAVENOUS
  Filled 2023-10-25 (×4): qty 1

## 2023-10-25 MED ORDER — LIDOCAINE 2% (20 MG/ML) 5 ML SYRINGE
INTRAMUSCULAR | Status: DC | PRN
  Administered 2023-10-25: 100 mg via INTRAVENOUS

## 2023-10-25 MED ORDER — PHENYLEPHRINE 80 MCG/ML (10ML) SYRINGE FOR IV PUSH (FOR BLOOD PRESSURE SUPPORT)
PREFILLED_SYRINGE | INTRAVENOUS | Status: AC
  Filled 2023-10-25: qty 10

## 2023-10-25 MED ORDER — KETOROLAC TROMETHAMINE 0.5 % OP SOLN
OPHTHALMIC | Status: AC
  Filled 2023-10-25: qty 5

## 2023-10-25 MED ORDER — PROPOFOL 10 MG/ML IV BOLUS
INTRAVENOUS | Status: DC | PRN
  Administered 2023-10-25: 50 mg via INTRAVENOUS
  Administered 2023-10-25: 150 mg via INTRAVENOUS

## 2023-10-25 MED ORDER — SENNOSIDES-DOCUSATE SODIUM 8.6-50 MG PO TABS
1.0000 | ORAL_TABLET | Freq: Every day | ORAL | Status: DC
  Filled 2023-10-25: qty 1

## 2023-10-25 MED ORDER — SUGAMMADEX SODIUM 200 MG/2ML IV SOLN
INTRAVENOUS | Status: DC | PRN
  Administered 2023-10-25: 400 mg via INTRAVENOUS

## 2023-10-25 MED ORDER — BSS IO SOLN
INTRAOCULAR | Status: AC
  Filled 2023-10-25: qty 15

## 2023-10-25 MED ORDER — SODIUM CHLORIDE (PF) 0.9 % IJ SOLN
INTRAMUSCULAR | Status: AC
  Filled 2023-10-25: qty 50

## 2023-10-25 MED ORDER — ACETAMINOPHEN 500 MG PO TABS
1000.0000 mg | ORAL_TABLET | Freq: Four times a day (QID) | ORAL | Status: DC
  Filled 2023-10-25: qty 2

## 2023-10-25 MED ORDER — ASPIRIN 81 MG PO TBEC: 81.0000 mg | DELAYED_RELEASE_TABLET | Freq: Every day | ORAL | Status: DC

## 2023-10-25 MED ORDER — CEFAZOLIN SODIUM-DEXTROSE 2-4 GM/100ML-% IV SOLN
2.0000 g | INTRAVENOUS | Status: AC
  Administered 2023-10-25: 2 g via INTRAVENOUS
  Filled 2023-10-25: qty 100

## 2023-10-25 MED ORDER — GABAPENTIN 300 MG PO CAPS: 300.0000 mg | ORAL_CAPSULE | Freq: Two times a day (BID) | ORAL | Status: DC

## 2023-10-25 MED ORDER — EPHEDRINE SULFATE (PRESSORS) 50 MG/ML IJ SOLN
INTRAMUSCULAR | Status: DC | PRN
  Administered 2023-10-25 (×2): 5 mg via INTRAVENOUS

## 2023-10-25 MED ORDER — LIDOCAINE 2% (20 MG/ML) 5 ML SYRINGE
INTRAMUSCULAR | Status: AC
  Filled 2023-10-25: qty 5

## 2023-10-25 MED ORDER — ACETAMINOPHEN 10 MG/ML IV SOLN: 1000.0000 mg | Freq: Once | INTRAVENOUS | Status: DC | PRN

## 2023-10-25 MED ORDER — PANTOPRAZOLE SODIUM 40 MG PO TBEC
40.0000 mg | DELAYED_RELEASE_TABLET | Freq: Every day | ORAL | Status: DC
  Administered 2023-10-26: 40 mg via ORAL
  Filled 2023-10-25: qty 1

## 2023-10-25 MED ORDER — CEFAZOLIN SODIUM-DEXTROSE 2-4 GM/100ML-% IV SOLN
2.0000 g | Freq: Three times a day (TID) | INTRAVENOUS | Status: AC
  Administered 2023-10-25 – 2023-10-26 (×2): 2 g via INTRAVENOUS
  Filled 2023-10-25 (×3): qty 100

## 2023-10-25 MED ORDER — GABAPENTIN 300 MG PO CAPS
300.0000 mg | ORAL_CAPSULE | Freq: Every day | ORAL | Status: DC
  Administered 2023-10-25: 300 mg via ORAL
  Filled 2023-10-25: qty 1

## 2023-10-25 MED ORDER — ENOXAPARIN SODIUM 40 MG/0.4ML IJ SOSY: 40.0000 mg | PREFILLED_SYRINGE | Freq: Every day | INTRAMUSCULAR | Status: DC

## 2023-10-25 MED ORDER — PROPOFOL 10 MG/ML IV BOLUS
INTRAVENOUS | Status: AC
  Filled 2023-10-25: qty 20

## 2023-10-25 MED ORDER — FENTANYL CITRATE (PF) 250 MCG/5ML IJ SOLN
INTRAMUSCULAR | Status: DC | PRN
  Administered 2023-10-25 (×2): 25 ug via INTRAVENOUS
  Administered 2023-10-25: 50 ug via INTRAVENOUS
  Administered 2023-10-25: 100 ug via INTRAVENOUS
  Administered 2023-10-25: 50 ug via INTRAVENOUS

## 2023-10-25 MED ORDER — OXYCODONE HCL 5 MG/5ML PO SOLN: 5.0000 mg | Freq: Once | ORAL | Status: DC | PRN

## 2023-10-25 MED ORDER — PHENYLEPHRINE 80 MCG/ML (10ML) SYRINGE FOR IV PUSH (FOR BLOOD PRESSURE SUPPORT)
PREFILLED_SYRINGE | INTRAVENOUS | Status: DC | PRN
  Administered 2023-10-25: 80 ug via INTRAVENOUS

## 2023-10-25 MED ORDER — DEXAMETHASONE SODIUM PHOSPHATE 10 MG/ML IJ SOLN
INTRAMUSCULAR | Status: DC | PRN
  Administered 2023-10-25: 10 mg via INTRAVENOUS

## 2023-10-25 MED ORDER — LACTATED RINGERS IV SOLN: INTRAVENOUS | Status: DC | PRN

## 2023-10-25 MED ORDER — ATORVASTATIN CALCIUM 10 MG PO TABS: 20.0000 mg | ORAL_TABLET | Freq: Every evening | ORAL | Status: DC

## 2023-10-25 MED ORDER — ONDANSETRON HCL 4 MG/2ML IJ SOLN: 4.0000 mg | Freq: Once | INTRAMUSCULAR | Status: DC | PRN

## 2023-10-25 MED ORDER — PHENYLEPHRINE HCL-NACL 20-0.9 MG/250ML-% IV SOLN
INTRAVENOUS | Status: DC | PRN
  Administered 2023-10-25: 10 ug/min via INTRAVENOUS
  Administered 2023-10-25: 20 ug/min via INTRAVENOUS

## 2023-10-25 MED ORDER — FENTANYL CITRATE (PF) 100 MCG/2ML IJ SOLN: 25.0000 ug | INTRAMUSCULAR | Status: DC | PRN

## 2023-10-25 MED ORDER — PHENYLEPHRINE HCL (PRESSORS) 10 MG/ML IV SOLN
INTRAVENOUS | Status: DC | PRN
  Administered 2023-10-25: 80 ug via INTRAVENOUS

## 2023-10-25 MED ORDER — LACTATED RINGERS IV SOLN: INTRAVENOUS | Status: DC

## 2023-10-25 MED ORDER — LOSARTAN POTASSIUM 50 MG PO TABS
100.0000 mg | ORAL_TABLET | Freq: Every day | ORAL | Status: DC
  Administered 2023-10-26: 100 mg via ORAL
  Filled 2023-10-25: qty 2

## 2023-10-25 MED ORDER — ONDANSETRON HCL 4 MG/2ML IJ SOLN: 4.0000 mg | Freq: Four times a day (QID) | INTRAMUSCULAR | Status: DC | PRN

## 2023-10-25 MED ORDER — EPHEDRINE 5 MG/ML INJ
INTRAVENOUS | Status: AC
  Filled 2023-10-25: qty 5

## 2023-10-25 MED ORDER — ROCURONIUM BROMIDE 10 MG/ML (PF) SYRINGE
PREFILLED_SYRINGE | INTRAVENOUS | Status: DC | PRN
  Administered 2023-10-25 (×2): 10 mg via INTRAVENOUS
  Administered 2023-10-25: 20 mg via INTRAVENOUS
  Administered 2023-10-25: 10 mg via INTRAVENOUS
  Administered 2023-10-25: 100 mg via INTRAVENOUS
  Administered 2023-10-25: 10 mg via INTRAVENOUS

## 2023-10-25 SURGICAL SUPPLY — 79 items
BLADE CLIPPER SURG (BLADE) ×1 IMPLANT
BLADE SURG 11 STRL SS (BLADE) ×1 IMPLANT
CANISTER SUCT 3000ML PPV (MISCELLANEOUS) ×2 IMPLANT
CANNULA REDUCER 12-8 DVNC XI (CANNULA) ×2 IMPLANT
CATH THOR STR 28F SOFT WA (CATHETERS) IMPLANT
CATH THORACIC 28FR (CATHETERS) ×1 IMPLANT
CLIP TI MEDIUM 6 (CLIP) IMPLANT
CNTNR URN SCR LID CUP LEK RST (MISCELLANEOUS) ×5 IMPLANT
CONN ST 1/4X3/8 BEN (MISCELLANEOUS) IMPLANT
DEFOGGER SCOPE WARMER CLEARIFY (MISCELLANEOUS) ×1 IMPLANT
DERMABOND ADVANCED .7 DNX12 (GAUZE/BANDAGES/DRESSINGS) ×1 IMPLANT
DRAIN CHANNEL 28F RND 3/8 FF (WOUND CARE) IMPLANT
DRAPE ARM DVNC X/XI (DISPOSABLE) ×4 IMPLANT
DRAPE COLUMN DVNC XI (DISPOSABLE) ×1 IMPLANT
DRAPE CV SPLIT W-CLR ANES SCRN (DRAPES) ×1 IMPLANT
DRAPE SURG ORHT 6 SPLT 77X108 (DRAPES) ×1 IMPLANT
DURAPREP 26ML APPLICATOR (WOUND CARE) IMPLANT
ELECT BLADE 6.5 EXT (BLADE) IMPLANT
ELECTRODE REM PT RTRN 9FT ADLT (ELECTROSURGICAL) ×1 IMPLANT
FORCEPS BPLR LNG DVNC XI (INSTRUMENTS) IMPLANT
FORCEPS CADIERE DVNC XI (FORCEP) IMPLANT
GAUZE KITTNER 4X8 (MISCELLANEOUS) ×1 IMPLANT
GAUZE SPONGE 4X4 12PLY STRL (GAUZE/BANDAGES/DRESSINGS) ×1 IMPLANT
GLOVE BIO SURGEON STRL SZ7.5 (GLOVE) ×2 IMPLANT
GLOVE SURG POLYISO LF SZ8 (GLOVE) ×1 IMPLANT
GOWN STRL REUS W/ TWL LRG LVL3 (GOWN DISPOSABLE) ×2 IMPLANT
GOWN STRL REUS W/ TWL XL LVL3 (GOWN DISPOSABLE) ×2 IMPLANT
GOWN STRL REUS W/TWL 2XL LVL3 (GOWN DISPOSABLE) ×1 IMPLANT
GRASPER TIP-UP FEN DVNC XI (INSTRUMENTS) IMPLANT
HEMOSTAT SURGICEL 2X14 (HEMOSTASIS) ×1 IMPLANT
IRRIGATION STRYKERFLOW (MISCELLANEOUS) IMPLANT
KIT BASIN OR (CUSTOM PROCEDURE TRAY) ×1 IMPLANT
KIT TURNOVER KIT B (KITS) ×1 IMPLANT
NDL 22X1.5 STRL (OR ONLY) (MISCELLANEOUS) ×1 IMPLANT
NEEDLE 22X1.5 STRL (OR ONLY) (MISCELLANEOUS) ×1 IMPLANT
NS IRRIG 1000ML POUR BTL (IV SOLUTION) ×3 IMPLANT
PACK CHEST (CUSTOM PROCEDURE TRAY) ×1 IMPLANT
PAD ARMBOARD POSITIONER FOAM (MISCELLANEOUS) ×5 IMPLANT
RELOAD STAPLE 45 2.0 GRY DVNC (STAPLE) IMPLANT
RELOAD STAPLE 45 2.5 WHT DVNC (STAPLE) IMPLANT
RELOAD STAPLE 45 3.5 BLU DVNC (STAPLE) IMPLANT
RELOAD STAPLE 45 4.3 GRN DVNC (STAPLE) IMPLANT
RELOAD STAPLER 2.5X45 WHT DVNC (STAPLE) ×2 IMPLANT
RELOAD STAPLER 3.5X45 BLU DVNC (STAPLE) ×3 IMPLANT
RELOAD STAPLER 4.3X45 GRN DVNC (STAPLE) ×1 IMPLANT
SCISSORS LAP 5X35 DISP (ENDOMECHANICALS) IMPLANT
SEAL UNIV 5-12 XI (MISCELLANEOUS) ×4 IMPLANT
SEALANT PROGEL (MISCELLANEOUS) IMPLANT
SEALER LIGASURE MARYLAND 30 (ELECTROSURGICAL) IMPLANT
SET TRI-LUMEN FLTR TB AIRSEAL (TUBING) ×1 IMPLANT
SOLUTION ELECTROSURG ANTI STCK (MISCELLANEOUS) IMPLANT
SPONGE INTESTINAL PEANUT (DISPOSABLE) IMPLANT
SPONGE TONSIL 1 RF SGL (DISPOSABLE) IMPLANT
STAPLE RELOAD 45 2.0 GRAY DVNC (STAPLE) ×2 IMPLANT
STOPCOCK 4 WAY LG BORE MALE ST (IV SETS) ×1 IMPLANT
SUT MNCRL AB 3-0 PS2 18 (SUTURE) IMPLANT
SUT MON AB 2-0 CT1 36 (SUTURE) IMPLANT
SUT PDS AB 1 CTX 36 (SUTURE) IMPLANT
SUT PROLENE 4-0 RB1 .5 CRCL 36 (SUTURE) IMPLANT
SUT SILK 1 MH (SUTURE) ×1 IMPLANT
SUT SILK 1 TIES 10X30 (SUTURE) IMPLANT
SUT SILK 2 0 SH (SUTURE) IMPLANT
SUT SILK 2 0SH CR/8 30 (SUTURE) IMPLANT
SUT VIC AB 1 CTX36XBRD ANBCTR (SUTURE) IMPLANT
SUT VIC AB 2-0 CT1 TAPERPNT 27 (SUTURE) ×1 IMPLANT
SUT VIC AB 3-0 SH 27X BRD (SUTURE) ×2 IMPLANT
SUT VICRYL 0 TIES 12 18 (SUTURE) ×1 IMPLANT
SUT VICRYL 0 UR6 27IN ABS (SUTURE) ×2 IMPLANT
SUT VICRYL 2 TP 1 (SUTURE) IMPLANT
SYR 10ML LL (SYRINGE) ×1 IMPLANT
SYR 20ML LL LF (SYRINGE) ×1 IMPLANT
SYR 50ML LL SCALE MARK (SYRINGE) ×1 IMPLANT
SYSTEM RETRIEVAL ANCHOR 15 (MISCELLANEOUS) IMPLANT
SYSTEM SAHARA CHEST DRAIN ATS (WOUND CARE) ×1 IMPLANT
TAPE CLOTH 4X10 WHT NS (GAUZE/BANDAGES/DRESSINGS) ×1 IMPLANT
TIP APPLICATOR SPRAY EXTEND 16 (VASCULAR PRODUCTS) IMPLANT
TOWEL GREEN STERILE (TOWEL DISPOSABLE) ×1 IMPLANT
TRAY FOLEY MTR SLVR 16FR STAT (SET/KITS/TRAYS/PACK) ×1 IMPLANT
TUBING EXTENTION W/L.L. (IV SETS) ×1 IMPLANT

## 2023-10-25 NOTE — Plan of Care (Signed)

## 2023-10-25 NOTE — Anesthesia Procedure Notes (Signed)
 Procedure Name: Intubation Date/Time: 10/25/2023 11:42 AM  Performed by: Eugena Herter, RNPre-anesthesia Checklist: Patient identified, Emergency Drugs available, Suction available and Patient being monitored Patient Re-evaluated:Patient Re-evaluated prior to induction Oxygen Delivery Method: Circle system utilized Preoxygenation: Pre-oxygenation with 100% oxygen Induction Type: IV induction Ventilation: Oral airway inserted - appropriate to patient size Laryngoscope Size: Mac and 4 Grade View: Grade I Tube type: Oral Endobronchial tube: Left, EBT position confirmed by auscultation and EBT position confirmed by fiberoptic bronchoscope and 39 Fr Number of attempts: 1 Airway Equipment and Method: Oral airway, Fiberoptic brochoscope and Rigid stylet Placement Confirmation: ETT inserted through vocal cords under direct vision, positive ETCO2 and breath sounds checked- equal and bilateral Secured at: 29 cm Tube secured with: Tape Dental Injury: Teeth and Oropharynx as per pre-operative assessment

## 2023-10-25 NOTE — Interval H&P Note (Signed)
 History and Physical Interval Note:  10/25/2023 11:10 AM  [name]  has presented today for surgery, with the diagnosis of lung cancer.  The various methods of treatment have been discussed with the patient and family. After consideration of risks, benefits and other options for treatment, the patient has consented to  Procedure(s) with comments: LOBECTOMY, LUNG, ROBOT-ASSISTED, USING VATS (Right) - RIGHT ROBOTIC ASSISTED THORACOSCOPY FOR RIGHT LOWER LOBECTOMY as a surgical intervention.  The patient's history has been reviewed, patient examined, no change in status, stable for surgery.  I have reviewed the patient's chart and labs.  Questions were answered to the patient's satisfaction.     [name]

## 2023-10-25 NOTE — Hospital Course (Signed)
  Referring: Heilingoetter, Software engineer* Primary Care: [name], MD Primary Cardiologist: Teddie Favre, MD    History of Present Illness:    [name] 73 y.o. male presents for surgical evaluation of a biopsy-proven right lower lobe non-small cell lung cancer.  This was found incidentally during surveillance and ascending aortic aneurysm.  It was first noted in 2023.  It has increased in size over that time, and he recently underwent a biopsy which made the diagnosis.   He is a lifelong non-smoker, but has had environmental exposures in his past.  He does have a history of coronary artery bypass grafting in 2015, but denies any chest pain or shortness of breath.  He also has a history of atrial flutter.  Dr. Deloise Ferries reviewed the patient and all relevant studies recommended proceeding with lobectomy.  He was admitted this hospitalization for the procedure.  Hospital Course:  [name] underwent Robotic Assisted Right Lower Lobectomy, Node Dissection, and Nerve Block by Dr. Deloise Ferries on 10/25/2023.  The patient tolerated the procedure without difficulty, was extubated, and taken to PACU in stable condition.  The patient's chest tube remained on water  seal with evidence of an air leak.  Chest xray did not show evidence of pneumothorax.  He was resumed on his home regimen of CPAP.  Oxygen was weaned and he maintained good saturations on room air.  He has remained hemodynamically stable in sinus rhythm.  Incisions are healing well without evidence of infection.  He is tolerating diet and routine activities using standard postoperative protocols.  The patient's chest tube was clamped.  Follow up chest xray showed the lung to be fully expanded without evidence of pneumothorax.  His chest tube was removed without difficulty.  He was ambulating independently.  He did have some urinary discomfort following the Foley removal and will be started on a short course of Pyridium.  His surgical  incision is healing without evidence of infection.  He was felt stable for discharge home today.

## 2023-10-25 NOTE — Brief Op Note (Signed)
 10/25/2023  2:37 PM  PATIENT:  [name]  73 y.o. male  PRE-OPERATIVE DIAGNOSIS:  non-small lung cancer  POST-OPERATIVE DIAGNOSIS:  non-small lung cancer  PROCEDURE:  Procedure(s) with comments: LOBECTOMY, RIGHT LOWER LUNG, ROBOT-ASSISTED, USING VATS (Right) - RIGHT ROBOTIC ASSISTED THORACOSCOPY FOR RIGHT LOWER LOBECTOMY LYMPH NODE BIOPSY (Right) BLOCK, NERVE, INTERCOSTAL (Right)  SURGEON:  Surgeons and Role:    * Lightfoot, Marinell Siad, MD - Primary  PHYSICIAN ASSISTANT: Ramelo Oetken PA-C   ANESTHESIA:   local, epidural, and INTERCOSTAL BLOCK  EBL:  30 mL   BLOOD ADMINISTERED:none  DRAINS:  1 Chest Tube(s) in the RIGHT HEMITHORAX    LOCAL MEDICATIONS USED:  BUPIVICAINE  and OTHER EXPAREL   SPECIMEN:  Source of Specimen:  RIGHT LOWER LOBE AND MULTIPLE LN SAMPLES  DISPOSITION OF SPECIMEN:  PATHOLOGY  COUNTS:  YES  TOURNIQUET:  * No tourniquets in log *  DICTATION: .Dragon Dictation  PLAN OF CARE: Admit to inpatient   PATIENT DISPOSITION:  PACU - hemodynamically stable.   Delay start of Pharmacological VTE agent (>24hrs) due to surgical blood loss or risk of bleeding: no  COMPLICATIONS: NO KNOWN

## 2023-10-25 NOTE — Discharge Summary (Signed)
 Physician Discharge Summary       [address]       [place] [address]             [phone]    Patient ID: [name] MRN: [id number] DOB/AGE: 10-Mar-1951 73 y.o.  Admit date: 10/25/2023 Discharge date: 10/26/2023  Admission Diagnoses:  Patient Active Problem List   Diagnosis Date Noted   Lung cancer (HCC) 09/24/2023   Pulmonary nodule 1 cm or greater in diameter 08/30/2023   S/P shoulder replacement, right 01/30/2020   Erectile dysfunction 04/15/2014   Paroxysmal atrial flutter (HCC) 02/22/2014   S/P CABG x 3 12/30/2013   Encounter for adjustment of gastric lap band 01/06/2013   History of laparoscopic adjustable gastric banding, AP standard, 09/09/2012. 09/26/2012   Morbid obesity, weight 283, BMI - 42.5. 05/24/2012   Aortic root dilatation (HCC) 05/19/2009   Mixed hyperlipidemia 03/22/2009   Essential hypertension, benign 03/22/2009   CAD, NATIVE VESSEL 03/22/2009   Discharge Diagnoses:   Patient Active Problem List   Diagnosis Date Noted   S/P video-assisted thoracoscopic surgery (VATS) 10/25/2023   Status post lobectomy of lung 10/25/2023   Lung cancer (HCC) 09/24/2023   Pulmonary nodule 1 cm or greater in diameter 08/30/2023   S/P shoulder replacement, right 01/30/2020   Erectile dysfunction 04/15/2014   Paroxysmal atrial flutter (HCC) 02/22/2014   S/P CABG x 3 12/30/2013   Encounter for adjustment of gastric lap band 01/06/2013   History of laparoscopic adjustable gastric banding, AP standard, 09/09/2012. 09/26/2012   Morbid obesity, weight 283, BMI - 42.5. 05/24/2012   Aortic root dilatation (HCC) 05/19/2009   Mixed hyperlipidemia 03/22/2009   Essential hypertension, benign 03/22/2009   CAD, NATIVE VESSEL 03/22/2009   Consults: None  Procedure (s):   10/25/2023   Patient:  [name] Pre-Op Dx: Right lower lobe NCSLC   Post-op Dx:  same Procedure: - Robotic assisted right video thoracoscopy - right lower lobectomy -  Mediastinal lymph node sampling - Intercostal nerve block   Surgeon and Role:      * Lightfoot, Marinell Siad, MD - Primary   Assistant: Lorrin Rotter, PA-C    Referring: Heilingoetter, Cassandr* Primary Care: [name], MD Primary Cardiologist: Teddie Favre, MD    History of Present Illness:    [name] 73 y.o. male presents for surgical evaluation of a biopsy-proven right lower lobe non-small cell lung cancer.  This was found incidentally during surveillance and ascending aortic aneurysm.  It was first noted in 2023.  It has increased in size over that time, and he recently underwent a biopsy which made the diagnosis.   He is a lifelong non-smoker, but has had environmental exposures in his past.  He does have a history of coronary artery bypass grafting in 2015, but denies any chest pain or shortness of breath.  He also has a history of atrial flutter.  Dr. Deloise Ferries reviewed the patient and all relevant studies recommended proceeding with lobectomy.  He was admitted this hospitalization for the procedure.  Hospital Course:  [name] underwent Robotic Assisted Right Lower Lobectomy, Node Dissection, and Nerve Block by Dr. Deloise Ferries on 10/25/2023.  The patient tolerated the procedure without difficulty, was extubated, and taken to PACU in stable condition.  The patient's chest tube remained on water  seal with evidence of an air leak.  Chest xray did not show evidence of pneumothorax.  He was resumed on his home regimen of CPAP.  Oxygen was  weaned and he maintained good saturations on room air.  He has remained hemodynamically stable in sinus rhythm.  Incisions are healing well without evidence of infection.  He is tolerating diet and routine activities using standard postoperative protocols.  The patient's chest tube was clamped.  Follow up chest xray showed the lung to be fully expanded without evidence of pneumothorax.  His chest tube was removed without difficulty.  He was  ambulating independently.  He did have some urinary discomfort following the Foley removal and will be started on a short course of Pyridium.  His surgical incision is healing without evidence of infection.  He was felt stable for discharge home today.    Latest Vital Signs: Blood pressure 138/71, pulse 80, temperature 97.7 F (36.5 C), temperature source Oral, resp. rate 19, height 5\' 6"  (1.676 m), weight 106.8 kg, SpO2 94%.  Physical Exam: Performed by Matt Song PA-C General appearance: alert, cooperative, and no distress Heart: regular rate and rhythm Lungs: coarse on right Abdomen: benign Extremities: no edema or calf tenderness Wound: incis healing well   Discharge Condition: Good  Recent laboratory studies:  Lab Results  Component Value Date   WBC 8.6 10/26/2023   HGB 13.0 10/26/2023   HCT 38.6 (L) 10/26/2023   MCV 91.7 10/26/2023   PLT 169 10/26/2023   Lab Results  Component Value Date   NA 136 10/26/2023   K 4.2 10/26/2023   CL 105 10/26/2023   CO2 18 (L) 10/26/2023   CREATININE 0.72 10/26/2023   GLUCOSE 117 (H) 10/26/2023    Discharge Medications: Allergies as of 10/26/2023       Reactions   Bee Venom Anaphylaxis   Other Anaphylaxis   Bee stings - lips and tongue swelling, throat tightens    Chlorhexidine  Itching   Pt. States on last admission CHG wipes made him itch and it had to be washed off.  Uncertain if benadryl was given.        Medication List     TAKE these medications    acetaminophen  500 MG tablet Commonly known as: TYLENOL  Take 1-2 tablets (500-1,000 mg total) by mouth every 6 (six) hours as needed.   aspirin  EC 81 MG tablet Take 81 mg by mouth at bedtime.   atorvastatin  20 MG tablet Commonly known as: LIPITOR Take 1 tablet (20 mg total) by mouth every evening.   b complex vitamins tablet 1 tablet daily. B12-B6-Folic Acid (Sublingual)   Co Q-10 100 MG Caps Take 100 mg by mouth daily.   cyclobenzaprine  10 MG tablet Commonly  known as: FLEXERIL  Take 1 tablet (10 mg total) by mouth 3 (three) times daily as needed for muscle spasms.   EPINEPHrine  0.3 mg/0.3 mL Soaj injection Commonly known as: EPI-PEN Inject 0.3 mLs into the skin as needed for anaphylaxis.   EYE VITAMINS PO Take 1 capsule by mouth daily. [facility]   gabapentin  300 MG capsule Commonly known as: NEURONTIN  Take 1 capsule (300 mg total) by mouth at bedtime for 2 days, THEN 1 capsule (300 mg total) 2 (two) times daily. Start taking on: October 26, 2023   Krill Oil 1000 MG Caps Take 1,000 mg by mouth daily.   loratadine  10 MG tablet Commonly known as: CLARITIN  Take 10 mg by mouth daily as needed for allergies.   losartan  100 MG tablet Commonly known as: COZAAR  Take 100 mg by mouth daily.   LYCOPENE PO Take 1 tablet by mouth daily.   metoprolol  tartrate 50 MG tablet  Commonly known as: LOPRESSOR  Take 50 mg by mouth 2 (two) times daily.   Mounjaro 15 MG/0.5ML Pen Generic drug: tirzepatide Inject 15 mg into the skin once a week. Mondays   naproxen  500 MG tablet Commonly known as: NAPROSYN  Take 500 mg by mouth 2 (two) times daily with a meal.   nitroGLYCERIN  0.4 MG SL tablet Commonly known as: NITROSTAT  Place 0.4 mg under the tongue every 5 (five) minutes as needed for chest pain. Up to 3x   OLIVE LEAF EXTRACT PO Take 1 capsule by mouth daily. 20,000 mg   phenazopyridine 100 MG tablet Commonly known as: PYRIDIUM Take 1 tablet (100 mg total) by mouth 3 (three) times daily with meals as needed (urinary discomfort).   POMEGRANATE PO Take 5,000 mg by mouth daily.   Tadalafil  2.5 MG Tabs Take 2.5 mg by mouth every morning.   traMADol  50 MG tablet Commonly known as: ULTRAM  Take 1 tablet (50 mg total) by mouth every 6 (six) hours as needed (mild pain).   Turmeric 500 MG Tabs Take 500 mg by mouth 2 (two) times daily.   Vitamin D  125 MCG (5000 UT) Caps Take 5,000 Units by mouth daily.        Follow Up Appointments:   Follow-up Information     [facility] - [facility] Dept. of [facility] [place] [facility] Follow up.   Specialty: Cardiothoracic Surgery Why: Please see discharge paperwork for details of follow-up appointments with Dr. Raina Bunting office Contact information: [address] [place]  [id number] [phone]                Signed: Lord Rod 10/26/2023, 4:03 PM

## 2023-10-25 NOTE — Op Note (Signed)
      [address]       [place] [address]             [phone]        10/25/2023  Patient:  [name] Pre-Op Dx: Right lower lobe NCSLC   Post-op Dx:  same Procedure: - Robotic assisted right video thoracoscopy - right lower lobectomy - Mediastinal lymph node sampling - Intercostal nerve block  Surgeon and Role:      * Danuta Huseman, Marinell Siad, MD - Primary  Assistant: Lorrin Rotter, PA-C  An experienced assistant was required given the complexity of this surgery and the standard of surgical care. The assistant was needed for exposure, dissection, suctioning, retraction of delicate tissues and sutures, instrument exchange and for overall help during this procedure.    Anesthesia  general EBL:  100 ml Blood Administration: none Specimen:  right lower lobe, hilar and mediastinal nodes  Drains: 28 F argyle chest tube in right chest Counts: correct   Indications: 72yo male with 3.3cm right lower lobe NSCLC.  Acceptable pulmonary functions.  MRI brain negative.  We discussed the risks and benefits of right robotic assisted thoracoscopy with right lower lobectomy.  He is already scheduled to undergo a stress test which was ordered by his cardiologist.  He is agreeable to proceed.    Findings: Normal anatomy.  Operative Technique: After the risks, benefits and alternatives were thoroughly discussed, the patient was brought to the operative theatre.  Anesthesia was induced, and the patient was then placed in a lateral decubitus position and was prepped and draped in normal sterile fashion.  An appropriate surgical pause was performed, and pre-operative antibiotics were dosed accordingly.  We began by placing our 4 robotic ports in the the 7th intercostal space targeting the hilum of the lung.  A 12mm assistant port was placed in the 9th intercostal space in the anterior axillary line.  The robot was then docked and all instruments were passed under direct  visualization.    The lung was then retracted superiorly, and the inferior pulmonary ligament was divided.  The hilum was mobilized anteriorly and posteriorly.  We identified the lower lobe pulmonary vein, and after careful isolation, it was divided with a vascular stapler.  We next moved to the pulmonary artery.  The artery was then divided with a vascular load stapler.  The bronchus to the lower lobe was then isolated.  After a test clamp, with good ventilation of the remaining lung, the bronchus was then divided.  The fissure was completed, and the specimen was passed into an endocatch bag.  It was removed from the anterior access site.    Lymph nodes were then sampled at hilum and mediastinum.  The chest was irrigated, and an air leak test was performed.  An intercostal nerve block was performed under direct visualization.  A 28 F chest tube was then placed, and we watch the remaining lobes re-expand.  The skin and soft tissue were closed with absorbable suture    The patient tolerated the procedure without any immediate complications, and was transferred to the PACU in stable condition.  Gabrial Domine Ala Alice

## 2023-10-25 NOTE — Transfer of Care (Signed)
 Immediate Anesthesia Transfer of Care Note  Patient: [name]  Procedure(s) Performed: LOBECTOMY, RIGHT LOWER LUNG, ROBOT-ASSISTED, USING VATS (Right: Chest) LYMPH NODE BIOPSY (Right: Chest) BLOCK, NERVE, INTERCOSTAL (Right: Chest)  Patient Location: PACU  Anesthesia Type:General  Level of Consciousness: awake, alert , and oriented  Airway & Oxygen Therapy: Patient Spontanous Breathing and Patient connected to face mask oxygen  Post-op Assessment: Report given to RN, Post -op Vital signs reviewed and stable, Patient moving all extremities X 4, and Patient able to stick tongue midline  Post vital signs: Reviewed and stable  Last Vitals:  Vitals Value Taken Time  BP 110/45   Temp 98.6   Pulse 67 10/25/23 1448  Resp 17 10/25/23 1447  SpO2 96 % 10/25/23 1448  Vitals shown include unfiled device data.  Last Pain:  Vitals:   10/25/23 0946  TempSrc:   PainSc: 0-No pain         Complications: No notable events documented.

## 2023-10-25 NOTE — Anesthesia Procedure Notes (Signed)
 Arterial Line Insertion Start/End4/24/2025 10:15 AM, 10/25/2023 10:20 AM Performed by: Leslye Rast, MD, Fayetta Hoose, CRNA, CRNA  Patient location: Pre-op. Preanesthetic checklist: patient identified, IV checked, site marked, risks and benefits discussed, surgical consent, monitors and equipment checked, pre-op evaluation, timeout performed and anesthesia consent Lidocaine  1% used for infiltration radial was placed Catheter size: 20 G Hand hygiene performed  and maximum sterile barriers used   Attempts: 1 Procedure performed using ultrasound guided technique. Following insertion, dressing applied. Post procedure assessment: normal and unchanged

## 2023-10-26 ENCOUNTER — Encounter (HOSPITAL_COMMUNITY): Payer: Self-pay | Admitting: Thoracic Surgery (Cardiothoracic Vascular Surgery)

## 2023-10-26 ENCOUNTER — Inpatient Hospital Stay (HOSPITAL_COMMUNITY)

## 2023-10-26 ENCOUNTER — Other Ambulatory Visit: Payer: Self-pay

## 2023-10-26 LAB — BASIC METABOLIC PANEL WITH GFR
Anion gap: 13 (ref 5–15)
BUN: 18 mg/dL (ref 8–23)
CO2: 18 mmol/L — ABNORMAL LOW (ref 22–32)
Calcium: 8 mg/dL — ABNORMAL LOW (ref 8.9–10.3)
Chloride: 105 mmol/L (ref 98–111)
Creatinine, Ser: 0.72 mg/dL (ref 0.61–1.24)
GFR, Estimated: >60 mL/min (ref >60)
Glucose, Bld: 117 mg/dL — ABNORMAL HIGH (ref 70–99)
Potassium: 4.2 mmol/L (ref 3.5–5.1)
Sodium: 136 mmol/L (ref 135–145)

## 2023-10-26 LAB — CBC
HCT: 38.6 % — ABNORMAL LOW (ref 39.0–52.0)
Hemoglobin: 13 g/dL (ref 13.0–17.0)
MCH: 30.9 pg (ref 26.0–34.0)
MCHC: 33.7 g/dL (ref 30.0–36.0)
MCV: 91.7 fL (ref 80.0–100.0)
Platelets: 169 10*3/uL (ref 150–400)
RBC: 4.21 MIL/uL — ABNORMAL LOW (ref 4.22–5.81)
RDW: 13.2 % (ref 11.5–15.5)
WBC: 8.6 10*3/uL (ref 4.0–10.5)
nRBC: 0 % (ref 0.0–0.2)

## 2023-10-26 MED ORDER — ACETAMINOPHEN 500 MG PO TABS
500.0000 mg | ORAL_TABLET | Freq: Four times a day (QID) | ORAL | Status: AC | PRN
Start: 2023-10-26 — End: ?

## 2023-10-26 MED ORDER — GABAPENTIN 300 MG PO CAPS: ORAL_CAPSULE | ORAL | 0 refills | Status: AC

## 2023-10-26 MED ORDER — TRAMADOL HCL 50 MG PO TABS
50.0000 mg | ORAL_TABLET | Freq: Four times a day (QID) | ORAL | 0 refills | Status: DC | PRN
Start: 2023-10-26 — End: 2023-11-09

## 2023-10-26 MED ORDER — PHENAZOPYRIDINE HCL 100 MG PO TABS: 100.0000 mg | ORAL_TABLET | Freq: Three times a day (TID) | ORAL | Status: DC | PRN

## 2023-10-26 MED ORDER — PHENAZOPYRIDINE HCL 100 MG PO TABS: 100.0000 mg | ORAL_TABLET | Freq: Three times a day (TID) | ORAL | 0 refills | Status: DC | PRN

## 2023-10-26 NOTE — Plan of Care (Signed)

## 2023-10-26 NOTE — TOC Transition Note (Signed)
 Transition of Care [facility]) - Discharge Note   Patient Details  Name: [name] MRN: [id number] Date of Birth: 1950/12/15  [facility]) CM/SW Contact:  Jennett Model, RN Phone Number: 10/26/2023, 4:42 PM   Clinical Narrative:    For dc has no needs.     Barriers to Discharge: Continued Medical Work up   Patient Goals and CMS Choice Patient states their goals for this hospitalization and ongoing recovery are:: to return home          Discharge Placement                       Discharge Plan and Services Additional resources added to the After Visit Summary for                                       Social Drivers of Health (SDOH) Interventions SDOH Screenings   Food Insecurity: No Food Insecurity (10/25/2023)  Housing: Low Risk  (10/25/2023)  Transportation Needs: No Transportation Needs (10/25/2023)  Utilities: Not At Risk (10/25/2023)  Social Connections: Moderately Isolated (10/25/2023)  Tobacco Use: Medium Risk (10/25/2023)     Readmission Risk Interventions     No data to display

## 2023-10-26 NOTE — TOC Progression Note (Signed)
 Transition of Care [facility]) - Progression Note    Patient Details  Name: [name] MRN: [id number] Date of Birth: 1951/06/01  Transition [facility] Care [facility]) CM/SW Contact  Juliane Och, LCSW Phone Number: 10/26/2023, 12:32 PM  Clinical Narrative:     12:32 PM CSW introduced self and role to patient at bedside. Patient's spouse, Cary Clarks, was also present at bedside. Patient consented CSW to speak in front of spouse. Patient confirmed he resides at home with spouse who could provide transportation and home support upon discharge if needed. Patient denied DME/SNF history. Patient confirmed he has used HH in the past but did not remember agency's name. No TOC needs identified at this time.   Expected Discharge Plan: Home/Self Care Barriers to Discharge: Continued Medical Work up  Expected Discharge Plan and Services       Living arrangements for the past 2 months: Single Family Home                                       Social Determinants of Health (SDOH) Interventions SDOH Screenings   Food Insecurity: No Food Insecurity (10/25/2023)  Housing: Low Risk  (10/25/2023)  Transportation Needs: No Transportation Needs (10/25/2023)  Utilities: Not At Risk (10/25/2023)  Social Connections: Moderately Isolated (10/25/2023)  Tobacco Use: Medium Risk (10/25/2023)    Readmission Risk Interventions     No data to display

## 2023-10-26 NOTE — Anesthesia Postprocedure Evaluation (Signed)
 Anesthesia Post Note  Patient: [name]  Procedure(s) Performed: LOBECTOMY, RIGHT LOWER LUNG, ROBOT-ASSISTED, USING VATS (Right: Chest) LYMPH NODE BIOPSY (Right: Chest) BLOCK, NERVE, INTERCOSTAL (Right: Chest)     Patient location during evaluation: PACU Anesthesia Type: General Level of consciousness: awake and alert Pain management: pain level controlled Vital Signs Assessment: post-procedure vital signs reviewed and stable Respiratory status: spontaneous breathing, nonlabored ventilation, respiratory function stable and patient connected to nasal cannula oxygen Cardiovascular status: blood pressure returned to baseline and stable Postop Assessment: no apparent nausea or vomiting Anesthetic complications: no   No notable events documented.  Last Vitals:  Vitals:   10/26/23 0440 10/26/23 0758  BP: 102/64 121/70  Pulse: 63 64  Resp: 17 17  Temp: 36.8 C 37.1 C  SpO2: 97% 94%    Last Pain:  Vitals:   10/26/23 0758  TempSrc: Oral  PainSc:                  Leslye Rast

## 2023-10-26 NOTE — Plan of Care (Signed)
 Problem: Education: Goal: Knowledge of General Education information will improve Description: Including pain rating scale, medication(s)/side effects and non-pharmacologic comfort measures 10/26/2023 1618 by Dortha Gauss, RN Outcome: Adequate for Discharge 10/26/2023 1531 by Dortha Gauss, RN Outcome: Progressing   Problem: Health Behavior/Discharge Planning: Goal: Ability to manage health-related needs will improve 10/26/2023 1618 by Dortha Gauss, RN Outcome: Adequate for Discharge 10/26/2023 1531 by Dortha Gauss, RN Outcome: Progressing   Problem: Clinical Measurements: Goal: Ability to maintain clinical measurements within normal limits will improve 10/26/2023 1618 by Dortha Gauss, RN Outcome: Adequate for Discharge 10/26/2023 1531 by Dortha Gauss, RN Outcome: Progressing Goal: Will remain free from infection 10/26/2023 1618 by Dortha Gauss, RN Outcome: Adequate for Discharge 10/26/2023 1531 by Dortha Gauss, RN Outcome: Progressing Goal: Diagnostic test results will improve 10/26/2023 1618 by Dortha Gauss, RN Outcome: Adequate for Discharge 10/26/2023 1531 by Dortha Gauss, RN Outcome: Progressing Goal: Respiratory complications will improve 10/26/2023 1618 by Dortha Gauss, RN Outcome: Adequate for Discharge 10/26/2023 1531 by Dortha Gauss, RN Outcome: Progressing Goal: Cardiovascular complication will be avoided 10/26/2023 1618 by Dortha Gauss, RN Outcome: Adequate for Discharge 10/26/2023 1531 by Dortha Gauss, RN Outcome: Progressing   Problem: Activity: Goal: Risk for activity intolerance will decrease 10/26/2023 1618 by Dortha Gauss, RN Outcome: Adequate for Discharge 10/26/2023 1531 by Dortha Gauss, RN Outcome: Progressing   Problem: Nutrition: Goal: Adequate nutrition will be maintained 10/26/2023 1618 by Dortha Gauss, RN Outcome: Adequate for Discharge 10/26/2023 1531 by Dortha Gauss, RN Outcome: Progressing    Problem: Coping: Goal: Level of anxiety will decrease 10/26/2023 1618 by Dortha Gauss, RN Outcome: Adequate for Discharge 10/26/2023 1531 by Dortha Gauss, RN Outcome: Progressing   Problem: Elimination: Goal: Will not experience complications related to bowel motility 10/26/2023 1618 by Dortha Gauss, RN Outcome: Adequate for Discharge 10/26/2023 1531 by Dortha Gauss, RN Outcome: Progressing Goal: Will not experience complications related to urinary retention 10/26/2023 1618 by Dortha Gauss, RN Outcome: Adequate for Discharge 10/26/2023 1531 by Dortha Gauss, RN Outcome: Progressing   Problem: Pain Managment: Goal: General experience of comfort will improve and/or be controlled 10/26/2023 1618 by Dortha Gauss, RN Outcome: Adequate for Discharge 10/26/2023 1531 by Dortha Gauss, RN Outcome: Progressing   Problem: Safety: Goal: Ability to remain free from injury will improve 10/26/2023 1618 by Dortha Gauss, RN Outcome: Adequate for Discharge 10/26/2023 1531 by Dortha Gauss, RN Outcome: Progressing   Problem: Skin Integrity: Goal: Risk for impaired skin integrity will decrease 10/26/2023 1618 by Dortha Gauss, RN Outcome: Adequate for Discharge 10/26/2023 1531 by Dortha Gauss, RN Outcome: Progressing   Problem: Education: Goal: Knowledge of disease or condition will improve 10/26/2023 1618 by Dortha Gauss, RN Outcome: Adequate for Discharge 10/26/2023 1531 by Dortha Gauss, RN Outcome: Progressing Goal: Knowledge of the prescribed therapeutic regimen will improve 10/26/2023 1618 by Dortha Gauss, RN Outcome: Adequate for Discharge 10/26/2023 1531 by Dortha Gauss, RN Outcome: Progressing   Problem: Activity: Goal: Risk for activity intolerance will decrease 10/26/2023 1618 by Dortha Gauss, RN Outcome: Adequate for Discharge 10/26/2023 1531 by Dortha Gauss, RN Outcome: Progressing   Problem: Cardiac: Goal: Will achieve and/or  maintain hemodynamic stability 10/26/2023 1618 by Dortha Gauss, RN Outcome: Adequate for Discharge 10/26/2023 1531 by Dortha Gauss, RN Outcome: Progressing   Problem: Clinical Measurements: Goal: Postoperative  complications will be avoided or minimized 10/26/2023 1618 by Dortha Gauss, RN Outcome: Adequate for Discharge 10/26/2023 1531 by Dortha Gauss, RN Outcome: Progressing   Problem: Respiratory: Goal: Respiratory status will improve 10/26/2023 1618 by Dortha Gauss, RN Outcome: Adequate for Discharge 10/26/2023 1531 by Dortha Gauss, RN Outcome: Progressing   Problem: Pain Management: Goal: Pain level will decrease 10/26/2023 1618 by Dortha Gauss, RN Outcome: Adequate for Discharge 10/26/2023 1531 by Dortha Gauss, RN Outcome: Progressing   Problem: Skin Integrity: Goal: Wound healing without signs and symptoms infection will improve 10/26/2023 1618 by Dortha Gauss, RN Outcome: Adequate for Discharge 10/26/2023 1531 by Dortha Gauss, RN Outcome: Progressing

## 2023-10-26 NOTE — Progress Notes (Signed)
 1 Day Post-Op Procedure(s) (LRB): LOBECTOMY, RIGHT LOWER LUNG, ROBOT-ASSISTED, USING VATS (Right) LYMPH NODE BIOPSY (Right) BLOCK, NERVE, INTERCOSTAL (Right) Subjective: Feels well, no SOB or pain  Objective: Vital signs in last 24 hours: Temp:  [97.7 F (36.5 C)-98.3 F (36.8 C)] 98.2 F (36.8 C) (04/25 0440) Pulse Rate:  [60-82] 63 (04/25 0440) Cardiac Rhythm: Heart block (04/24 1900) Resp:  [13-25] 17 (04/25 0440) BP: (94-131)/(53-88) 102/64 (04/25 0440) SpO2:  [92 %-98 %] 97 % (04/25 0440) Arterial Line BP: (107-109)/(42-44) 109/42 (04/24 1500) Weight:  [106.1 kg-106.8 kg] 106.8 kg (04/24 1651)  Hemodynamic parameters for last 24 hours:    Intake/Output from previous day: 04/24 0701 - 04/25 0700 In: 1106.9 [I.V.:800; IV Piggyback:306.9] Out: 867 [Urine:595; Blood:30; Chest Tube:242] Intake/Output this shift: No intake/output data recorded.  General appearance: alert, cooperative, and no distress Heart: regular rate and rhythm Lungs: coarse on right Abdomen: benign Extremities: no edema or calf tenderness Wound: incis healing well  Lab Results: Recent Labs    10/23/23 1340 10/26/23 0214  WBC 5.8 8.6  HGB 15.1 13.0  HCT 44.8 38.6*  PLT 207 169   BMET:  Recent Labs    10/23/23 1340 10/26/23 0214  NA 137 136  K 4.1 4.2  CL 104 105  CO2 22 18*  GLUCOSE 93 117*  BUN 15 18  CREATININE 0.84 0.72  CALCIUM  9.3 8.0*    PT/INR:  Recent Labs    10/23/23 1340  LABPROT 13.4  INR 1.0   ABG    Component Value Date/Time   PHART 7.383 12/30/2013 1911   HCO3 22.2 12/30/2013 1911   TCO2 22 12/31/2013 1637   ACIDBASEDEF 2.0 12/30/2013 1911   O2SAT 94.0 12/30/2013 1911   CBG (last 3)  No results for input(s): "GLUCAP" in the last 72 hours.  Meds Scheduled Meds:  acetaminophen   1,000 mg Oral Q6H   Or   acetaminophen  (TYLENOL ) oral liquid 160 mg/5 mL  1,000 mg Oral Q6H   aspirin  EC  81 mg Oral QHS   atorvastatin   20 mg Oral QPM   bisacodyl   10 mg  Oral QHS   enoxaparin  (LOVENOX ) injection  40 mg Subcutaneous Daily   gabapentin   300 mg Oral QHS   Followed by   Cecily Cohen ON 10/28/2023] gabapentin   300 mg Oral BID   ketorolac   1 drop Right Eye Q6H   ketorolac   15 mg Intravenous Q6H   losartan   100 mg Oral Daily   metoprolol  tartrate  50 mg Oral BID   pantoprazole   40 mg Oral Daily   senna-docusate  1 tablet Oral QHS   trimethoprim -polymyxin b   1 drop Right Eye Q6H   Continuous Infusions: PRN Meds:.ondansetron  (ZOFRAN ) IV, traMADol   Xrays DG Chest Port 1 View Result Date: 10/25/2023 CLINICAL DATA:  Status post lobectomy EXAM: PORTABLE CHEST 1 VIEW COMPARISON:  10/23/2023, 10/08/2023, CT 09/05/2023 FINDINGS: Post sternotomy changes. Interim placement of right-sided chest tube with tip at the right apex. Minimal atelectasis at the right base. Removal of previously noted right medial basilar fiducial marker corresponding to history of lobectomy. No discrete pneumothorax. Air in the soft tissues of the right supraclavicular fossa and chest wall. Upper normal cardiac size. Bilateral shoulder replacements. IMPRESSION: Interim placement of right-sided chest tube with tip at the right apex. No discrete pneumothorax. Minimal atelectasis at the right base. Electronically Signed   By: Esmeralda Hedge M.D.   On: 10/25/2023 19:13    Assessment/Plan: S/P Procedure(s) (LRB): LOBECTOMY, RIGHT LOWER  LUNG, ROBOT-ASSISTED, USING VATS (Right) LYMPH NODE BIOPSY (Right) BLOCK, NERVE, INTERCOSTAL (Right) POD#1  1 afeb, S BP 90's-130's, HR 60's-120's, sinus rhythm 2 O2 sats good on RA 3 fair UOP 4 CT 242 ml recorded. Small air leak w/ cough- keep for now 5 CXR- clear lung fields without pntx 6 normal renal fxn 7 minor expected ABLA 8 routine pulm hygiene and rehab    LOS: 1 day    Lindi Revering PA-C Pager [phone] 10/26/2023

## 2023-10-29 LAB — SURGICAL PATHOLOGY

## 2023-11-08 ENCOUNTER — Other Ambulatory Visit: Payer: Self-pay

## 2023-11-08 ENCOUNTER — Other Ambulatory Visit: Payer: Self-pay | Admitting: Thoracic Surgery (Cardiothoracic Vascular Surgery)

## 2023-11-08 DIAGNOSIS — C3490 Malignant neoplasm of unspecified part of unspecified bronchus or lung: Secondary | ICD-10-CM

## 2023-11-08 NOTE — Progress Notes (Signed)
      [address]       [place] [address]             [phone]        [name] [facility] Medical Record #[id number] Date of Birth: [birth year]  Referring: Matthew Jerry, MD Primary Care: Matthew Jerry, MD Primary Cardiologist:Matthew Londa Rival, MD  Reason for visit:   follow-up  History of Present Illness:     [name] presents for their first follow-up appointment.  Overall, he is doing well.    Physical Exam: There were no vitals taken for this visit.  Alert NAD Incision clean.   Abdomen, ND no peripheral edema   Diagnostic Studies & Laboratory data:  Path:  FINAL MICROSCOPIC DIAGNOSIS:  A. LYMPH NODE, HILAR, EXCISION: - Lymph node, negative for carcinoma (0/1)  B. LYMPH NODE, LEVEL 9, EXCISION: - Lymph node, negative for carcinoma (0/1)  C. LYMPH NODE, LEVEL 9 #2, EXCISION: - Lymph node, negative for carcinoma (0/1)  D. LYMPH NODE, HILAR #2, EXCISION: - Lymph node, negative for carcinoma (0/1)  E. LYMPH NODE, HILAR #3, EXCISION: - Lymph node, negative for carcinoma (0/1)  F. LYMPH NODE, HILAR #4, EXCISION: - Lymph node, negative for carcinoma (0/1)  G. LYMPH NODE, HILAR #5, EXCISION: - Lymph node, negative for carcinoma (0/1)  H. LYMPH NODE, LEVEL 7, EXCISION: - Lymph node, negative for carcinoma (0/1)  I. LUNG, RIGHT LOWER LOBE, LOBECTOMY: - Mucinous adenocarcinoma of lung, 4.2 cm - Tumor abuts visceral pleura but pleural invasion is not seen - Resection margins are negative - No evidence of lymphovascular invasion - Lymph node, negative for carcinoma (0/1) - See oncology table     ONCOLOGY TABLE:  LUNG: Resection  Synchronous Tumors: Not applicable Total Number of Primary Tumors: 1 Procedure: Lobectomy, lung Specimen Laterality: Right Tumor Focality: Unifocal Tumor Site: Lower lobe Tumor Size: 4.2 cm Histologic Type: Mucinous adenocarcinoma Visceral Pleura Invasion: Not identified Direct  Invasion of Adjacent Structures: No adjacent structures present Lymphovascular Invasion: Not identified Margins: All margins negative for invasive carcinoma      Closest Margin(s) to Invasive Carcinoma:      Margin(s) Involved by Invasive Carcinoma: Not applicable       Margin Status for Non-Invasive Tumor: Not applicable Treatment Effect: No known presurgical therapy Regional Lymph Nodes: Not applicable (no lymph nodes submitted or found)      Number of Lymph Nodes Involved: 0                           Nodal Sites with Tumor: NA      Number of Lymph Nodes Examined: 9                      Nodal Sites Examined: Levels 7, 9, 10 Distant Metastasis:      Distant Site(s) Involved: Not applicable Pathologic Stage Classification (pTNM, AJCC 8th Edition): pT2b, pN0     Assessment / Plan:   73 y.o. male s/p Right lower lobectomy for  T2bN0M0 adenocarcinoma.  I have made a referral to medical oncology, along with a 1 month follow-up with a CXR.     Hilarie Lovely 11/08/2023 2:29 PM

## 2023-11-09 ENCOUNTER — Ambulatory Visit (HOSPITAL_COMMUNITY)
Admission: RE | Admit: 2023-11-09 | Discharge: 2023-11-09 | Disposition: A | Discharge status: Home / Self Care | Source: Ambulatory Visit | Attending: Cardiology | Admitting: Cardiology

## 2023-11-09 ENCOUNTER — Encounter: Payer: Self-pay | Admitting: Thoracic Surgery (Cardiothoracic Vascular Surgery)

## 2023-11-09 ENCOUNTER — Ambulatory Visit
Payer: Self-pay | Attending: Thoracic Surgery (Cardiothoracic Vascular Surgery) | Admitting: Thoracic Surgery (Cardiothoracic Vascular Surgery)

## 2023-11-09 ENCOUNTER — Telehealth: Payer: Self-pay | Admitting: Physician Assistant

## 2023-11-09 VITALS — BP 165/92 | HR 76 | Resp 20 | Ht 66.0 in | Wt 237.1 lb

## 2023-11-09 DIAGNOSIS — C3490 Malignant neoplasm of unspecified part of unspecified bronchus or lung: Secondary | ICD-10-CM | POA: Insufficient documentation

## 2023-11-09 DIAGNOSIS — J929 Pleural plaque without asbestos: Secondary | ICD-10-CM | POA: Diagnosis not present

## 2023-11-09 DIAGNOSIS — R918 Other nonspecific abnormal finding of lung field: Secondary | ICD-10-CM | POA: Diagnosis not present

## 2023-11-09 DIAGNOSIS — Z48813 Encounter for surgical aftercare following surgery on the respiratory system: Secondary | ICD-10-CM | POA: Diagnosis not present

## 2023-11-09 DIAGNOSIS — Z902 Acquired absence of lung [part of]: Secondary | ICD-10-CM

## 2023-11-09 NOTE — Telephone Encounter (Signed)
 Scheduled appointments with the patient. The patient is aware of the appointment details and is active on MyChart.

## 2023-11-09 NOTE — Progress Notes (Signed)
 The proposed treatment discussed in conference is for discussion purpose only and is not a binding recommendation.  The patients have not been physically examined, or presented with their treatment options.  Therefore, final treatment plans cannot be decided.

## 2023-11-13 ENCOUNTER — Encounter (HOSPITAL_COMMUNITY): Payer: Self-pay

## 2023-11-14 DIAGNOSIS — C3491 Malignant neoplasm of unspecified part of right bronchus or lung: Secondary | ICD-10-CM | POA: Diagnosis not present

## 2023-11-15 ENCOUNTER — Encounter (HOSPITAL_COMMUNITY): Payer: Self-pay

## 2023-11-17 NOTE — Progress Notes (Signed)
 [facility] OFFICE PROGRESS NOTE  [name], Matthew Sax, MD [address] [place] [address]  DIAGNOSIS: Stage IIA (T2b, N0, M0) non-small cell lung cancer, adenocarcinoma. He presented with a right lower lobe lung mass. He was diagnosed in March 2025  Biomarker Findings HRD signature - HRDsig Negative Microsatellite status - MS-Stable Tumor Mutational Burden - 0 Muts/Mb Genomic Findings For a complete list of the genes assayed, please refer to the Appendix. CDKN2A loss KRAS G12V STK11 loss CDKN2B loss 7 Disease relevant genes with no reportable alterations: ALK, BRAF, EGFR, ERBB2, MET, RET, ROS1  PDL1: 0%  PRIOR THERAPY: 1) s/p Right lower lobectomy for  under the care of Dr. Deloise Ferries which was completed on 10/25/23  CURRENT THERAPY: Adjuvant chemotherapy with cisplatin 75 mg/m2 and alimta 500 mg/m2 IV every 3 weeks. First dose expected 12/11/23  INTERVAL HISTORY: [name] 73 y.o. male returns to the clinic today for a follow up visit. The patient established care in the clinic on 09/24/23 for early stage lung cancer.  He was referred to cardiothoracic surgery for consideration of surgical resection for which he underwent right lower lobectomy under the care of Dr. Deloise Ferries on 10/25/2023. The final pathology showed 4.2 cm mucinous adenocarcinoma of the lung.  This was staged as a T2b, pN0 otherwise stage IIA.   The patient tolerated chemotherapy well.  He has no complications from the surgery and is recovering well.   He has persistent nasal congestion for the past two to three years, which was evaluated by an ENT specialist who found no significant issues. He experiences hoarseness that began about a week ago, which he associates with increased mucus production and coughing. No recent upper respiratory infections, fevers, chills, or night sweats. He takes an antihistamine.   He consumes alcohol  daily, primarily beer. He also takes a weight loss medication, which he  plans to discuss with his family doctor regarding its use during chemotherapy.  No pain, nausea, vomiting, diarrhea, constipation, or headaches. No issues with surgical recovery, including no fever or chills. He has a history of heart issues but is not currently on any heart medications. He mentions a past aneurysm that was monitored with scans.   He is here today for evaluation and to discuss the next step in his care   MEDICAL HISTORY: Past Medical History:  Diagnosis Date   Aortic root dilatation (HCC)    Arthritis    Coronary atherosclerosis of native coronary artery    Multiple stents - RCA/LAD/diagonal, overlapping DES LAD, stent thrombosis 2005, ultimately CABG   Essential hypertension    H/O hiatal hernia    History of GI bleed    On nonsteroidals   Hyperlipemia    Myocardial infarction [facility])    Anterior with LAD stent thrombosis 2005, 2004 and 2005    Obesity    Paroxysmal atrial flutter (HCC)    Sleep apnea    CPAP daily    ALLERGIES:  is allergic to bee venom, other, and chlorhexidine .  MEDICATIONS:  Current Outpatient Medications  Medication Sig Dispense Refill   acetaminophen  (TYLENOL ) 500 MG tablet Take 1-2 tablets (500-1,000 mg total) by mouth every 6 (six) hours as needed.     aspirin  81 MG EC tablet Take 81 mg by mouth at bedtime.      atorvastatin  (LIPITOR) 20 MG tablet Take 1 tablet (20 mg total) by mouth every evening.     b complex vitamins tablet 1 tablet daily. B12-B6-Folic Acid (Sublingual)  Cholecalciferol  (VITAMIN D ) 125 MCG (5000 UT) CAPS Take 5,000 Units by mouth daily.      Coenzyme Q10 (CO Q-10) 100 MG CAPS Take 100 mg by mouth daily.      cyclobenzaprine  (FLEXERIL ) 10 MG tablet Take 1 tablet (10 mg total) by mouth 3 (three) times daily as needed for muscle spasms. 30 tablet 0   EPINEPHrine  0.3 mg/0.3 mL IJ SOAJ injection Inject 0.3 mLs into the skin as needed for anaphylaxis.      gabapentin  (NEURONTIN ) 300 MG capsule Take 1 capsule (300 mg  total) by mouth at bedtime for 2 days, THEN 1 capsule (300 mg total) 2 (two) times daily. 62 capsule 0   Krill Oil 1000 MG CAPS Take 1,000 mg by mouth daily.     loratadine  (CLARITIN ) 10 MG tablet Take 10 mg by mouth daily as needed for allergies.     losartan  (COZAAR ) 100 MG tablet Take 100 mg by mouth daily.     LYCOPENE PO Take 1 tablet by mouth daily.     metoprolol  (LOPRESSOR ) 50 MG tablet Take 50 mg by mouth 2 (two) times daily.     MOUNJARO 15 MG/0.5ML Pen Inject 15 mg into the skin once a week. Mondays     Multiple Vitamins-Minerals (EYE VITAMINS PO) Take 1 capsule by mouth daily. [facility]     naproxen  (NAPROSYN ) 500 MG tablet Take 500 mg by mouth 2 (two) times daily with a meal.     nitroGLYCERIN  (NITROSTAT ) 0.4 MG SL tablet Place 0.4 mg under the tongue every 5 (five) minutes as needed for chest pain. Up to 3x     OLIVE LEAF EXTRACT PO Take 1 capsule by mouth daily. 20,000 mg     Pomegranate, Punica granatum, (POMEGRANATE PO) Take 5,000 mg by mouth daily.     Tadalafil  2.5 MG TABS Take 2.5 mg by mouth every morning.     Turmeric 500 MG TABS Take 500 mg by mouth 2 (two) times daily.      No current facility-administered medications for this visit.    SURGICAL HISTORY:  Past Surgical History:  Procedure Laterality Date   APPENDECTOMY     BREATH TEK H PYLORI  06/11/2012   Procedure: BREATH TEK H PYLORI;  Surgeon: Thayne Fine, MD;  Location: [facility];  Service: General;  Laterality: N/ACain Castillo   BRONCHIAL BIOPSY  09/11/2023   Procedure: BRONCHOSCOPY, WITH BIOPSY;  Surgeon: Denson Flake, MD;  Location: [facility] END[facility];  Service: Pulmonary;;   BRONCHIAL BRUSHINGS  09/11/2023   Procedure: BRONCHOSCOPY, WITH BRUSH BIOPSY;  Surgeon: Denson Flake, MD;  Location: [facility];  Service: Pulmonary;;   BRONCHIAL NEEDLE ASPIRATION BIOPSY  09/11/2023   Procedure: BRONCHOSCOPY, WITH NEEDLE ASPIRATION BIOPSY;  Surgeon: Denson Flake, MD;  Location: [facility];  Service:  Pulmonary;;   CARDIAC SURGERY     CARPAL TUNNEL RELEASE Right 12/02/2010   CARPAL TUNNEL RELEASE  06/06/2011   Procedure: CARPAL TUNNEL RELEASE;  Surgeon: Amelie Baize., MD;  Location: [facility];  Service: Orthopedics;  Laterality: Right;   CARPAL TUNNEL RELEASE Right 04/10/2013   Procedure: REVISION OF CARPAL TUNNEL RELEASE LIGAMENT RIGHT WRIST;  Surgeon: Amelie Baize., MD;  Location: [facility];  Service: Orthopedics;  Laterality: Right;   CERVICAL FUSION  03/03/2009   CHOLECYSTECTOMY     CORONARY ANGIOPLASTY WITH STENT PLACEMENT  [id number]   CORONARY ARTERY BYPASS GRAFT N/A 12/30/2013   Procedure: CORONARY  ARTERY BYPASS GRAFTING (CABG) x three, using left internal mammary artery and right leg greater sapheneous vein harvested endoscopically - LIMA to LAD, SVG-D, SVG-OM1;  Surgeon: Heriberto London, MD;  Location: [facility] OR;  Service: Open Heart Surgery;  Laterality: N/A;   ELBOW ARTHROSCOPY Right    FUDUCIAL PLACEMENT  09/11/2023   Procedure: INSERTION, FIDUCIAL MARKER, GOLD;  Surgeon: Denson Flake, MD;  Location: [facility];  Service: Pulmonary;;   INTERCOSTAL NERVE BLOCK Right 10/25/2023   Procedure: BLOCK, NERVE, INTERCOSTAL;  Surgeon: Hilarie Lovely, MD;  Location: [facility] OR;  Service: Thoracic;  Laterality: Right;   IRRIGATION AND DEBRIDEMENT SHOULDER Right 04/30/2020   Procedure: Right shoulder poly exchange and possible glensphere exchange and left shoulder injection;  Surgeon: Winston Hawking, MD;  Location: [facility];  Service: Orthopedics;  Laterality: Right;   LAPAROSCOPIC GASTRIC BANDING  08/31/2012   left carpal tunnel release      LEFT HEART CATHETERIZATION WITH CORONARY ANGIOGRAM N/A 12/26/2013   Procedure: LEFT HEART CATHETERIZATION WITH CORONARY ANGIOGRAM;  Surgeon: Arleen Lacer, MD;  Location[facility];  Service: Cardiovascular;  Laterality: N/A;   LOBECTOMY, LUNG, ROBOT-ASSISTED, USING VATS Right 10/25/2023   Procedure:  LOBECTOMY, RIGHT LOWER LUNG, ROBOT-ASSISTED, USING VATS;  Surgeon: Hilarie Lovely, MD;  Location: [facility];  Service: Thoracic;  Laterality: Right;  RIGHT ROBOTIC ASSISTED THORACOSCOPY FOR RIGHT LOWER LOBECTOMY   LYMPH NODE BIOPSY Right 10/25/2023   Procedure: LYMPH NODE BIOPSY;  Surgeon: Hilarie Lovely, MD;  Location: [facility];  Service: Thoracic;  Laterality: Right;   REVERSE SHOULDER ARTHROPLASTY Right 01/30/2020   Procedure: REVERSE SHOULDER ARTHROPLASTY;  Surgeon: Winston Hawking, MD;  Location: [facility];  Service: Orthopedics;  Laterality: Right;  interscalene block   REVERSE SHOULDER ARTHROPLASTY Left 03/30/2023   Procedure: REVERSE SHOULDER ARTHROPLASTY;  Surgeon: Winston Hawking, MD;  Location: [facility];  Service: Orthopedics;  Laterality: Left;   SHOULDER ARTHROSCOPY Right 10/01/2009   TOTAL KNEE ARTHROPLASTY Right 07/03/2004   TOTAL KNEE ARTHROPLASTY Left 07/03/2005   ULNAR NERVE TRANSPOSITION  06/06/2011   Procedure: ULNAR NERVE DECOMPRESSION/TRANSPOSITION;  Surgeon: Amelie Baize., MD;  Location: [facility];  Service: Orthopedics;  Laterality: Right;  decompression ulnar nerve right cubital tunnel     REVIEW OF SYSTEMS:   Review of Systems  Constitutional: Negative for appetite change, chills, fatigue, fever and unexpected weight change.  HENT: Negative for mouth sores, nosebleeds, sore throat and trouble swallowing.   Eyes: Negative for eye problems and icterus.  Respiratory: Negative for cough, hemoptysis, shortness of breath and wheezing.   Cardiovascular: Negative for chest pain and leg swelling.  Gastrointestinal: Negative for abdominal pain, constipation, diarrhea, nausea and vomiting.  Genitourinary: Negative for bladder incontinence, difficulty urinating, dysuria, frequency and hematuria.   Musculoskeletal: Negative for back pain, gait problem, neck pain and neck stiffness.  Skin: Negative for itching and rash.  Neurological: Negative for dizziness,  extremity weakness, gait problem, headaches, light-headedness and seizures.  Hematological: Negative for adenopathy. Does not bruise/bleed easily.  Psychiatric/Behavioral: Negative for confusion, depression and sleep disturbance. The patient is not nervous/anxious.     PHYSICAL EXAMINATION:  There were no vitals taken for this visit.  ECOG PERFORMANCE STATUS: 0  Physical Exam  Constitutional: Oriented to person, place, and time and well-developed, well-nourished, and in no distress.  HENT:  Head: Normocephalic and atraumatic.  Mouth/Throat: Oropharynx is clear and moist. No oropharyngeal exudate.  Eyes: Conjunctivae are normal. Right eye exhibits no discharge. Left  eye exhibits no discharge. No scleral icterus.  Neck: Normal range of motion. Neck supple.  Cardiovascular: Normal rate, regular rhythm, murmur noted and intact distal pulses.   Pulmonary/Chest: Effort normal and breath sounds normal. No respiratory distress. No wheezes. No rales.  Abdominal: Soft. Bowel sounds are normal. Exhibits no distension and no mass. There is no tenderness.  Musculoskeletal: Normal range of motion. Exhibits no edema.  Lymphadenopathy:    No cervical adenopathy.  Neurological: Alert and oriented to person, place, and time. Exhibits normal muscle tone. Gait normal. Coordination normal.  Skin: Skin is warm and dry. No rash noted. Not diaphoretic. No erythema. No pallor.  Psychiatric: Mood, memory and judgment normal.  Vitals reviewed.  LABORATORY DATA: Lab Results  Component Value Date   WBC 8.6 10/26/2023   HGB 13.0 10/26/2023   HCT 38.6 (L) 10/26/2023   MCV 91.7 10/26/2023   PLT 169 10/26/2023      Chemistry      Component Value Date/Time   NA 136 10/26/2023 0214   K 4.2 10/26/2023 0214   CL 105 10/26/2023 0214   CO2 18 (L) 10/26/2023 0214   BUN 18 10/26/2023 0214   CREATININE 0.72 10/26/2023 0214   CREATININE 0.78 09/24/2023 1314      Component Value Date/Time   CALCIUM  8.0 (L)  10/26/2023 0214   ALKPHOS 41 10/23/2023 1340   AST 22 10/23/2023 1340   AST 19 09/24/2023 1314   ALT 21 10/23/2023 1340   ALT 16 09/24/2023 1314   BILITOT 0.7 10/23/2023 1340   BILITOT 0.8 09/24/2023 1314       RADIOGRAPHIC STUDIES:  DG Chest 2 View Result Date: 11/09/2023 CLINICAL DATA:  Status post VATS EXAM: CHEST - 2 VIEW COMPARISON:  Chest radiograph dated 10/26/2023 FINDINGS: Lines/tubes: Interval removal of right pleural catheter. Partially imaged laparoscopic gastric band. Right upper quadrant surgical clips. Lungs: Well inflated lungs. Slightly increased right basilar hazy opacity. Pleura: Slightly increased blunting of the right costophrenic angle and thickening of the right lateral pleura. No definite pneumothorax. Heart/mediastinum: Similar mildly enlarged cardiomediastinal silhouette. Bones: Similar fracture of the median sternotomy wires. Two partially imaged postsurgical changes of bilateral shoulder arthroplasties. Interval resolution of the right chest wall subcutaneous emphysema. IMPRESSION: 1. Interval removal of right pleural catheter. No definite pneumothorax. 2. Slightly increased right basilar hazy opacity, likely atelectasis and postprocedural change. 3. Slightly increased blunting of the right costophrenic angle and thickening of the right lateral pleura, likely a small pleural effusion. Electronically Signed   By: Limin  Xu M.D.   On: 11/09/2023 12:39   DG Chest 1 View Result Date: 10/26/2023 CLINICAL DATA:  [id number] S/P lobectomy of lung [id number] EXAM: CHEST  1 VIEW COMPARISON:  Earlier film of the same day FINDINGS: Right chest tube stable position, no pneumothorax evident. Minimal atelectasis at the right lung base. Left lung clear. Stable right lateral chest and lower neck subcutaneous emphysema. Heart size normal.  Post CABG. No effusion. Sternotomy wires.  Bilateral shoulder arthroplasty hardware. IMPRESSION: 1. Right chest tube without pneumothorax. 2. Minimal right base  atelectasis. Electronically Signed   By: Nicoletta Barrier M.D.   On: 10/26/2023 16:22   DG Chest Port 1 View Result Date: 10/26/2023 CLINICAL DATA:  Status post lobectomy. EXAM: PORTABLE CHEST 1 VIEW COMPARISON:  October 25, 2023. FINDINGS: Stable cardiomediastinal silhouette. Status post bilateral shoulder arthroplasties. Right-sided chest tube is noted without definite pneumothorax. Stable subcutaneous emphysema is seen in right supraclavicular and right lateral chest wall. Left lung  is clear. IMPRESSION: Right-sided chest tube is noted without definite pneumothorax. Electronically Signed   By: Rosalene Colon M.D.   On: 10/26/2023 09:46   DG Chest Port 1 View Result Date: 10/25/2023 CLINICAL DATA:  Status post lobectomy EXAM: PORTABLE CHEST 1 VIEW COMPARISON:  10/23/2023, 10/08/2023, CT 09/05/2023 FINDINGS: Post sternotomy changes. Interim placement of right-sided chest tube with tip at the right apex. Minimal atelectasis at the right base. Removal of previously noted right medial basilar fiducial marker corresponding to history of lobectomy. No discrete pneumothorax. Air in the soft tissues of the right supraclavicular fossa and chest wall. Upper normal cardiac size. Bilateral shoulder replacements. IMPRESSION: Interim placement of right-sided chest tube with tip at the right apex. No discrete pneumothorax. Minimal atelectasis at the right base. Electronically Signed   By: Esmeralda Hedge M.D.   On: 10/25/2023 19:13   DG Chest 2 View Result Date: 10/23/2023 CLINICAL DATA:  Preop for lung lobectomy. EXAM: CHEST - 2 VIEW COMPARISON:  Radiograph 10/08/2023.  CT 09/05/2023 FINDINGS: Prior median sternotomy. Stable heart size and mediastinal contours. Right lower lobe nodular opacity on prior CT is not well seen on the current exam, fiducial marker in this region. No acute airspace disease, pulmonary edema, pleural effusion or pneumothorax. Thoracic spondylosis IMPRESSION: 1. No acute chest findings. 2. Right lower  lobe nodular opacity on prior CT is not well seen on the current exam, fiducial marker in this region. Electronically Signed   By: Chadwick Colonel M.D.   On: 10/23/2023 16:07     ASSESSMENT/PLAN:  This is a very pleasant 73 year old male with Stage IIA (T2b, N0, M0) non-small cell lung cancer, adenocarcinoma. He presented with a right lower lobe lung mass. He was diagnosed in March 2025.  His PD-L1 expression is 0.  He does not have any actionable mutations.  The patient underwent surgical resection under the care of Dr. Deloise Ferries on 10/25/2023.  This measured 4.2 cm.  The patient was seen with Dr. Marguerita Shih today. Dr. Marguerita Shih discussed the role of adjuvant chemotherapy for 4 cycles given the size of the tumor.  Adjuvant chemotherapy recommended due to tumor size >4 cm to reduce recurrence risk and improve survival by 10%. Dr. Marguerita Shih would recommend systemic chemotherapy with cisplatin on day 1 as well as Alimta 500 mg/m IV every 3 weeks for a total of 4 cycles.  The patient is interested in this option and he is expected to undergo his first cycle of treatment around 12/11/23.  I will arrange for a chemo education class prior to his first cycle of treatment.  I sent a prescription for Compazine 10 mg every 6 hours as needed for nausea and vomiting.  I also sent him a prescription for folic acid 1 mg p.o. daily.  I also sent him a prescription for Decadron  to take 1 tablet twice a day the day before, day of, the day after chemotherapy.  I also sent him a prescription for Zofran  to take 1 tablet every 8 hours as needed for nausea and vomiting starting day 3 after chemo.  Discussed the importance of good hydration while on chemotherapy.  He will also receive a B12 injection while in the clinic today.  We will see him back with day 1 cycle 1.  We discussed placing referral to [facility] since the patient lives in [place].  After discussion, to avoid any delays in his care he is going to  proceed with treatment here at [facility] see.  I let him know that we are happy to arrange for his weekly labs to be performed at [facility].   Hoarseness Recent onset post-surgery, possibly due to increased mucus or intubation. ENT evaluation recommended if persistent. - Advise warm honey and tea for symptomatic relief. - Recommend ENT follow-up if hoarseness persists to evaluate vocal cords.  Chronic Nasal Congestion Chronic congestion for two to three years, possible allergic component, previously evaluated by ENT with no significant findings. - Consult with primary care physician regarding prescription options for nasal congestion. - Continue current antihistamine regimen, ensuring no combination with decongestants that may raise blood pressure.  The patient was advised to call immediately if she has any concerning symptoms in the interval. The patient voices understanding of current disease status and treatment options and is in agreement with the current care plan. All questions were answered. The patient knows to call the clinic with any problems, questions or concerns. We can certainly see the patient much sooner if necessary    No orders of the defined types were placed in this encounter.    Alyshia Kernan L Kyanna Mahrt, PA-C 11/17/23  ADDENDUM: Hematology/Oncology Attending: I had a face-to-face encounter with the patient today.  I reviewed his records, lab and recent pathology report.  This is a very pleasant 73 years old white male recently diagnosed with a stage IIa non-small cell lung cancer, adenocarcinoma with no actionable mutations and negative PD-L1 expression presented with right lower lobe lung mass status post right lower lobectomy with lymph node sampling under the care of Dr. Deloise Ferries on 10/25/2023. The patient is recovering well from his surgery.  The tumor size was 4.2 cm. I had a lengthy discussion with the patient and his son today about his current condition and  treatment options.  I recommended for the patient treatment with 4 cycles of adjuvant systemic chemotherapy with cisplatin 75 Mg/M2 and pemetrexed 500 Mg/M2 every 3 weeks.  He was also given the option of observation and close monitoring.  The patient is interested in the adjuvant treatment and he is expected to start the first cycle of this treatment in around 3 weeks. I discussed with the patient the adverse effects of this treatment including but not limited to alopecia, myelosuppression, nausea and vomiting, peripheral neuropathy, liver or renal dysfunction as well as hearing deficit. He will receive vitamin B12 injection today and will send prescription of folic acid, Decadron  and Compazine to his pharmacy. The patient was advised to call immediately if he has any other concerning symptoms in the interval. The total time spent in the appointment was 30 minutes including review of chart and various tests results, discussions about plan of care and coordination of care plan . Disclaimer: This note was dictated with voice recognition software. Similar sounding words can inadvertently be transcribed and may be missed upon review. Aurelio Blower, MD

## 2023-11-21 ENCOUNTER — Other Ambulatory Visit: Payer: Self-pay | Admitting: Internal Medicine

## 2023-11-21 DIAGNOSIS — R911 Solitary pulmonary nodule: Secondary | ICD-10-CM

## 2023-11-22 ENCOUNTER — Inpatient Hospital Stay

## 2023-11-22 ENCOUNTER — Inpatient Hospital Stay: Attending: Physician Assistant

## 2023-11-22 ENCOUNTER — Inpatient Hospital Stay: Admitting: Physician Assistant

## 2023-11-22 VITALS — BP 146/88 | HR 78 | Temp 97.2°F | Resp 18 | Wt 237.8 lb

## 2023-11-22 DIAGNOSIS — F1090 Alcohol use, unspecified, uncomplicated: Secondary | ICD-10-CM | POA: Insufficient documentation

## 2023-11-22 DIAGNOSIS — R490 Dysphonia: Secondary | ICD-10-CM | POA: Insufficient documentation

## 2023-11-22 DIAGNOSIS — R0981 Nasal congestion: Secondary | ICD-10-CM | POA: Diagnosis not present

## 2023-11-22 DIAGNOSIS — Z7189 Other specified counseling: Secondary | ICD-10-CM | POA: Insufficient documentation

## 2023-11-22 DIAGNOSIS — C3400 Malignant neoplasm of unspecified main bronchus: Secondary | ICD-10-CM | POA: Diagnosis not present

## 2023-11-22 DIAGNOSIS — C3491 Malignant neoplasm of unspecified part of right bronchus or lung: Secondary | ICD-10-CM

## 2023-11-22 DIAGNOSIS — C3431 Malignant neoplasm of lower lobe, right bronchus or lung: Secondary | ICD-10-CM | POA: Insufficient documentation

## 2023-11-22 DIAGNOSIS — Z902 Acquired absence of lung [part of]: Secondary | ICD-10-CM | POA: Diagnosis not present

## 2023-11-22 DIAGNOSIS — R911 Solitary pulmonary nodule: Secondary | ICD-10-CM

## 2023-11-22 LAB — CMP (CANCER CENTER ONLY)
ALT: 12 U/L (ref 0–44)
AST: 16 U/L (ref 15–41)
Albumin: 4.2 g/dL (ref 3.5–5.0)
Alkaline Phosphatase: 48 U/L (ref 38–126)
Anion gap: 7 (ref 5–15)
BUN: 27 mg/dL — ABNORMAL HIGH (ref 8–23)
CO2: 28 mmol/L (ref 22–32)
Calcium: 9.4 mg/dL (ref 8.9–10.3)
Chloride: 104 mmol/L (ref 98–111)
Creatinine: 0.77 mg/dL (ref 0.61–1.24)
GFR, Estimated: >60 mL/min (ref >60)
Glucose, Bld: 94 mg/dL (ref 70–99)
Potassium: 4.4 mmol/L (ref 3.5–5.1)
Sodium: 139 mmol/L (ref 135–145)
Total Bilirubin: 0.5 mg/dL (ref 0.0–1.2)
Total Protein: 7 g/dL (ref 6.5–8.1)

## 2023-11-22 LAB — CBC WITH DIFFERENTIAL (CANCER CENTER ONLY)
Abs Immature Granulocytes: 0.02 10*3/uL (ref 0.00–0.07)
Basophils Absolute: 0 10*3/uL (ref 0.0–0.1)
Basophils Relative: 1 %
Eosinophils Absolute: 0.5 10*3/uL (ref 0.0–0.5)
Eosinophils Relative: 7 %
HCT: 41.9 % (ref 39.0–52.0)
Hemoglobin: 14.2 g/dL (ref 13.0–17.0)
Immature Granulocytes: 0 %
Lymphocytes Relative: 24 %
Lymphs Abs: 1.6 10*3/uL (ref 0.7–4.0)
MCH: 30.3 pg (ref 26.0–34.0)
MCHC: 33.9 g/dL (ref 30.0–36.0)
MCV: 89.3 fL (ref 80.0–100.0)
Monocytes Absolute: 1.4 10*3/uL — ABNORMAL HIGH (ref 0.1–1.0)
Monocytes Relative: 19 %
Neutro Abs: 3.4 10*3/uL (ref 1.7–7.7)
Neutrophils Relative %: 49 %
Platelet Count: 194 10*3/uL (ref 150–400)
RBC: 4.69 MIL/uL (ref 4.22–5.81)
RDW: 13.5 % (ref 11.5–15.5)
WBC Count: 7 10*3/uL (ref 4.0–10.5)
nRBC: 0 % (ref 0.0–0.2)

## 2023-11-22 MED ORDER — CYANOCOBALAMIN 1000 MCG/ML IJ SOLN
1000.0000 ug | Freq: Once | INTRAMUSCULAR | Status: AC
  Administered 2023-11-22: 1000 ug via INTRAMUSCULAR
  Filled 2023-11-22: qty 1

## 2023-11-22 MED ORDER — FOLIC ACID 1 MG PO TABS: 1.0000 mg | ORAL_TABLET | Freq: Every day | ORAL | 2 refills | Status: DC

## 2023-11-22 MED ORDER — DEXAMETHASONE 4 MG PO TABS: ORAL_TABLET | ORAL | 2 refills | Status: AC

## 2023-11-22 MED ORDER — ONDANSETRON HCL 8 MG PO TABS: 8.0000 mg | ORAL_TABLET | Freq: Three times a day (TID) | ORAL | 2 refills | Status: AC | PRN

## 2023-11-22 MED ORDER — PROCHLORPERAZINE MALEATE 10 MG PO TABS
10.0000 mg | ORAL_TABLET | Freq: Four times a day (QID) | ORAL | 2 refills | Status: AC | PRN
Start: 2023-11-22 — End: ?

## 2023-11-22 NOTE — Patient Instructions (Addendum)
 Summary:  -The treatment that you will receive consists of two chemotherapy drugs, called Cisplatin and Alimta (also called Pemetrexed).  -We are planning on starting your treatment  in 2-3 weeks but before your start your treatment, I would like you to attend a Chemotherapy Education Class. This involves having you sit down with one of our nurse educators. She will discuss with your one-on-one more details about your treatment as well as general information about resources here at the cancer center.  -Your treatment will be given once every 3 weeks for 4 rounds. We will check your labs once a week just to make sure that important components of your blood are in an acceptable range.  -We will get a CT scan after 4 treatments.  -You need to hydrate well with this treatment. Side effects include but are limited to fatigue, drops in the blood count, kidney/liver dysfunction, peripheral neuropathy, infection risk, nausea, vomiting, etc.    Medications:  -I have sent a few important medication prescriptions to your pharmacy.  -Compazine was sent to your pharmacy. This medication is for nausea. You may take this every 6 hours as needed if you feel nauseous.  -I have also sent a prescription for 1 mg of folic acid to your pharmacy. We need you to take 1 tablet every day.  -We will administer vitamin B12 every 9 weeks while you are here in the clinic. You have received your first dose today.    Referrals or Imaging:     Follow up:  -We will see you back for a follow up visit 1 week after your first treatment to see how it went and help manage any side effects of treatment that you may have    -If you need to reach us  at any time, the main office number to the cancer center is [phone], when you call, ask to speak to either Cassie's or Dr. Bing Buff nurse

## 2023-11-22 NOTE — Progress Notes (Signed)
 START ON PATHWAY REGIMEN - Non-Small Cell Lung     A cycle is every 21 days:     Pemetrexed      Cisplatin   **Always confirm dose/schedule in your pharmacy ordering system**  Patient Characteristics: Postoperative without Neoadjuvant Therapy (Pathologic Staging), Stage IB (Tumor Size = 4 cm) or Stage II / III, Adjuvant Chemotherapy Planned, Stage IB (Tumor Size = 4 cm), or Stage II / III, Nonsquamous Cell Therapeutic Status: Postoperative without Neoadjuvant Therapy (Pathologic Staging) AJCC M Category: cM0 AJCC 9 Stage Grouping: IIA AJCC N Category: pN0 AJCC T Category: pT2b Check here if patient was staged using an edition other than AJCC Staging 9th Edition: false Adjuvant Chemotherapy Status: Adjuvant Chemotherapy Planned Histology: Nonsquamous Cell Intent of Therapy: Curative Intent, Discussed with Patient

## 2023-11-23 ENCOUNTER — Telehealth: Payer: Self-pay | Admitting: Physician Assistant

## 2023-11-23 ENCOUNTER — Other Ambulatory Visit: Payer: Self-pay

## 2023-11-23 NOTE — Telephone Encounter (Signed)
 Deborah scheduled his education appointment.

## 2023-11-28 ENCOUNTER — Other Ambulatory Visit: Payer: Self-pay

## 2023-11-30 ENCOUNTER — Other Ambulatory Visit: Payer: Self-pay

## 2023-12-03 ENCOUNTER — Inpatient Hospital Stay: Attending: Physician Assistant

## 2023-12-03 DIAGNOSIS — Z902 Acquired absence of lung [part of]: Secondary | ICD-10-CM | POA: Insufficient documentation

## 2023-12-03 DIAGNOSIS — C3431 Malignant neoplasm of lower lobe, right bronchus or lung: Secondary | ICD-10-CM | POA: Insufficient documentation

## 2023-12-03 DIAGNOSIS — Z79630 Long term (current) use of alkylating agent: Secondary | ICD-10-CM | POA: Insufficient documentation

## 2023-12-03 DIAGNOSIS — R530 Neoplastic (malignant) related fatigue: Secondary | ICD-10-CM | POA: Insufficient documentation

## 2023-12-03 DIAGNOSIS — Z5111 Encounter for antineoplastic chemotherapy: Secondary | ICD-10-CM | POA: Insufficient documentation

## 2023-12-03 DIAGNOSIS — Z79631 Long term (current) use of antimetabolite agent: Secondary | ICD-10-CM | POA: Insufficient documentation

## 2023-12-05 ENCOUNTER — Telehealth: Payer: Self-pay

## 2023-12-05 NOTE — Progress Notes (Unsigned)
 HPI: Patient returns for routine postoperative follow-up having undergone  obotic VATS with lobectomy on 10/25/2023. The patient's early postoperative recovery while in the hospital was unremarkable.  He presents today for 1 month follow up  Current Outpatient Medications  Medication Sig Dispense Refill   acetaminophen  (TYLENOL ) 500 MG tablet Take 1-2 tablets (500-1,000 mg total) by mouth every 6 (six) hours as needed.     aspirin  81 MG EC tablet Take 81 mg by mouth at bedtime.      atorvastatin  (LIPITOR) 20 MG tablet Take 1 tablet (20 mg total) by mouth every evening.     b complex vitamins tablet 1 tablet daily. B12-B6-Folic Acid  (Sublingual)     Cholecalciferol  (VITAMIN D ) 125 MCG (5000 UT) CAPS Take 5,000 Units by mouth daily.      Coenzyme Q10 (CO Q-10) 100 MG CAPS Take 100 mg by mouth daily.      cyclobenzaprine  (FLEXERIL ) 10 MG tablet Take 1 tablet (10 mg total) by mouth 3 (three) times daily as needed for muscle spasms. 30 tablet 0   dexamethasone  (DECADRON ) 4 MG tablet Take 1 tablet twice a day the day before, the day of, and the day after chemotherapy 40 tablet 2   EPINEPHrine  0.3 mg/0.3 mL IJ SOAJ injection Inject 0.3 mLs into the skin as needed for anaphylaxis.      folic acid  (FOLVITE ) 1 MG tablet Take 1 tablet (1 mg total) by mouth daily. 30 tablet 2   gabapentin  (NEURONTIN ) 300 MG capsule Take 1 capsule (300 mg total) by mouth at bedtime for 2 days, THEN 1 capsule (300 mg total) 2 (two) times daily. 62 capsule 0   Krill Oil 1000 MG CAPS Take 1,000 mg by mouth daily.     loratadine  (CLARITIN ) 10 MG tablet Take 10 mg by mouth daily as needed for allergies.     losartan  (COZAAR ) 100 MG tablet Take 100 mg by mouth daily.     LYCOPENE PO Take 1 tablet by mouth daily.     metoprolol  (LOPRESSOR ) 50 MG tablet Take 50 mg by mouth 2 (two) times daily.     MOUNJARO 15 MG/0.5ML Pen Inject 15 mg into the skin once a week. Mondays     Multiple Vitamins-Minerals (EYE VITAMINS PO) Take 1  capsule by mouth daily. [employer] [facility]     naproxen  (NAPROSYN ) 500 MG tablet Take 500 mg by mouth 2 (two) times daily with a meal.     nitroGLYCERIN  (NITROSTAT ) 0.4 MG SL tablet Place 0.4 mg under the tongue every 5 (five) minutes as needed for chest pain. Up to 3x     OLIVE LEAF EXTRACT PO Take 1 capsule by mouth daily. 20,000 mg     ondansetron  (ZOFRAN ) 8 MG tablet Take 1 tablet (8 mg total) by mouth every 8 (eight) hours as needed for nausea or vomiting. Starting day 3 after chemo 30 tablet 2   Pomegranate, Pun[place] granatum, (POMEGRANATE PO) Take 5,000 mg by mouth daily.     prochlorperazine  (COMPAZINE ) 10 MG tablet Take 1 tablet (10 mg total) by mouth every 6 (six) hours as needed. 30 tablet 2   Tadalafil  2.5 MG TABS Take 2.5 mg by mouth every morning.     Turmeric 500 MG TABS Take 500 mg by mouth 2 (two) times daily.      No current facility-administered medications for this visit.    Physical Exam: ***  Diagnostic Tests: ***  Impression: ***  Plan: ***   Matthew Kasal, PA-C [facility] [phone]

## 2023-12-05 NOTE — Telephone Encounter (Signed)
 Spoke with Debbie at [facility] in [place], [place] regarding shock wave therapy for the patient's feet to address ankle/foot arthritis and plantar fasciitis. Stephenie Einstein stated that the patient will receive this treatment once a month. She was calling to obtain medical clearance from Dr. Marguerita Shih to proceed with the therapy. Informed her that the information will be relayed to Dr. Marguerita Shih for review and that our office will follow up with a call regarding his recommendations.

## 2023-12-06 NOTE — Telephone Encounter (Signed)
 Attempted to contact [facility] in [place], [place] regarding Dr. Bing Buff recommendation on shock wave therapy. Dr. Marguerita Shih stated he does not have extensive knowledge of shock wave therapy but feels it is appropriate to proceed with current treatment. Left a voicemail requesting a return call if there are any questions.

## 2023-12-07 ENCOUNTER — Encounter: Payer: Self-pay | Admitting: Internal Medicine

## 2023-12-07 NOTE — Progress Notes (Signed)
 Pharmacist Chemotherapy Monitoring - Initial Assessment    Anticipated start date: 12/14/23   The following has been reviewed per standard work regarding the patient's treatment regimen: The patient's diagnosis, treatment plan and drug doses, and organ/hematologic function Lab orders and baseline tests specific to treatment regimen  The treatment plan start date, drug sequencing, and pre-medications Prior authorization status  Patient's documented medication list, including drug-drug interaction screen and prescriptions for anti-emetics and supportive care specific to the treatment regimen The drug concentrations, fluid compatibility, administration routes, and timing of the medications to be used The patient's access for treatment and lifetime cumulative dose history, if applicable  The patient's medication allergies and previous infusion related reactions, if applicable   Changes made to treatment plan:  N/A  Follow up needed:  N/A   Arin Peral, PharmD, MBA

## 2023-12-11 NOTE — Progress Notes (Signed)
 [facility] OFFICE PROGRESS NOTE  [name], [name], MD [address] [place] [address]  DIAGNOSIS: Stage IIA (T2b, N0, M0) non-small cell lung cancer, adenocarcinoma. He presented with a right lower lobe lung mass. He was diagnosed in March 2025   Biomarker Findings HRD signature - HRDsig Negative Microsatellite status - MS-Stable Tumor Mutational Burden - 0 Muts/Mb Genomic Findings For a complete list of the genes assayed, please refer to the Appendix. CDKN2A loss KRAS G12V STK11 loss CDKN2B loss 7 Disease relevant genes with no reportable alterations: ALK, BRAF, EGFR, ERBB2, MET, RET, ROS1   PDL1: 0%  PRIOR THERAPY: 1) s/p Right lower lobectomy for  under the care of Dr. Deloise Ferries which was completed on 10/25/23   CURRENT THERAPY: Adjuvant chemotherapy with cisplatin 75 mg/m2 and alimta 500 mg/m2 IV every 3 weeks. First dose expected 12/14/23   INTERVAL HISTORY: [name] 73 y.o. male returns to the clinic today for a follow up visit. The patient established care in the clinic on 09/24/23 for early stage lung cancer.  He was referred to cardiothoracic surgery for consideration of surgical resection for which he underwent right lower lobectomy under the care of Dr. Deloise Ferries on 10/25/2023. The final pathology showed 4.2 cm mucinous adenocarcinoma of the lung.  This was staged as a T2b, pN0 otherwise stage IIA.   Adjuvant chemotherapy was recommended. He is scheduled for his first cycle of treatment today. He had his chemoeducation and he does not have any questions.   He denies any major changes in his health. No recent upper respiratory infections, fevers, chills, or night sweats. He has stable dyspnea on exertion. He  denies pain, nausea, vomiting, diarrhea, constipation, or headaches. No issues with surgical recovery, including no fever or chills. He has had some intermittent hoarseness that comes and goes.   He is here for evaluation and repeat blood work  before undergoing cycle #1.     MEDICAL HISTORY: Past Medical History:  Diagnosis Date   Aortic root dilatation (HCC)    Arthritis    Coronary atherosclerosis of native coronary artery    Multiple stents - RCA/LAD/diagonal, overlapping DES LAD, stent thrombosis 2005, ultimately CABG   Essential hypertension    H/O hiatal hernia    History of GI bleed    On nonsteroidals   Hyperlipemia    Myocardial infarction [facility])    Anterior with LAD stent thrombosis 2005, 2004 and 2005    Obesity    Paroxysmal atrial flutter (HCC)    Sleep apnea    CPAP daily    ALLERGIES:  is allergic to bee venom, other, and chlorhexidine .  MEDICATIONS:  Current Outpatient Medications  Medication Sig Dispense Refill   acetaminophen  (TYLENOL ) 500 MG tablet Take 1-2 tablets (500-1,000 mg total) by mouth every 6 (six) hours as needed.     aspirin  81 MG EC tablet Take 81 mg by mouth at bedtime.      atorvastatin  (LIPITOR) 20 MG tablet Take 1 tablet (20 mg total) by mouth every evening.     b complex vitamins tablet 1 tablet daily. B12-B6-Folic Acid  (Sublingual)     Cholecalciferol  (VITAMIN D ) 125 MCG (5000 UT) CAPS Take 5,000 Units by mouth daily.      Coenzyme Q10 (CO Q-10) 100 MG CAPS Take 100 mg by mouth daily.      cyclobenzaprine  (FLEXERIL ) 10 MG tablet Take 1 tablet (10 mg total) by mouth 3 (three) times daily as needed for muscle spasms.  30 tablet 0   dexamethasone  (DECADRON ) 4 MG tablet Take 1 tablet twice a day the day before, the day of, and the day after chemotherapy 40 tablet 2   EPINEPHrine  0.3 mg/0.3 mL IJ SOAJ injection Inject 0.3 mLs into the skin as needed for anaphylaxis.      folic acid  (FOLVITE ) 1 MG tablet Take 1 tablet (1 mg total) by mouth daily. 30 tablet 2   gabapentin  (NEURONTIN ) 300 MG capsule Take 1 capsule (300 mg total) by mouth at bedtime for 2 days, THEN 1 capsule (300 mg total) 2 (two) times daily. 62 capsule 0   Krill Oil 1000 MG CAPS Take 1,000 mg by mouth daily.      loratadine  (CLARITIN ) 10 MG tablet Take 10 mg by mouth daily as needed for allergies.     losartan  (COZAAR ) 100 MG tablet Take 100 mg by mouth daily.     LYCOPENE PO Take 1 tablet by mouth daily.     metoprolol  (LOPRESSOR ) 50 MG tablet Take 50 mg by mouth 2 (two) times daily.     MOUNJARO 15 MG/0.5ML Pen Inject 15 mg into the skin once a week. Mondays     Multiple Vitamins-Minerals (EYE VITAMINS PO) Take 1 capsule by mouth daily. [employer]     naproxen  (NAPROSYN ) 500 MG tablet Take 500 mg by mouth 2 (two) times daily with a meal.     nitroGLYCERIN  (NITROSTAT ) 0.4 MG SL tablet Place 0.4 mg under the tongue every 5 (five) minutes as needed for chest pain. Up to 3x     OLIVE LEAF EXTRACT PO Take 1 capsule by mouth daily. 20,000 mg     ondansetron  (ZOFRAN ) 8 MG tablet Take 1 tablet (8 mg total) by mouth every 8 (eight) hours as needed for nausea or vomiting. Starting day 3 after chemo 30 tablet 2   Pomegranate, Punica granatum, (POMEGRANATE PO) Take 5,000 mg by mouth daily.     prochlorperazine  (COMPAZINE ) 10 MG tablet Take 1 tablet (10 mg total) by mouth every 6 (six) hours as needed. 30 tablet 2   Tadalafil  2.5 MG TABS Take 2.5 mg by mouth every morning.     Turmeric 500 MG TABS Take 500 mg by mouth 2 (two) times daily.      No current facility-administered medications for this visit.    SURGICAL HISTORY:  Past Surgical History:  Procedure Laterality Date   APPENDECTOMY     BREATH TEK H PYLORI  06/11/2012   Procedure: BREATH TEK H PYLORI;  Surgeon: Thayne Fine, MD;  Location: [facility];  Service: General;  Laterality: N/ACain Castillo   BRONCHIAL BIOPSY  09/11/2023   Procedure: BRONCHOSCOPY, WITH BIOPSY;  Surgeon: Denson Flake, MD;  Location: [facility] END[facility];  Service: Pulmonary;;   BRONCHIAL BRUSHINGS  09/11/2023   Procedure: BRONCHOSCOPY, WITH BRUSH BIOPSY;  Surgeon: Denson Flake, MD;  Location: [facility];  Service: Pulmonary;;   BRONCHIAL NEEDLE ASPIRATION BIOPSY  09/11/2023    Procedure: BRONCHOSCOPY, WITH NEEDLE ASPIRATION BIOPSY;  Surgeon: Denson Flake, MD;  Location: [facility];  Service: Pulmonary;;   CARDIAC SURGERY     CARPAL TUNNEL RELEASE Right 12/02/2010   CARPAL TUNNEL RELEASE  06/06/2011   Procedure: CARPAL TUNNEL RELEASE;  Surgeon: Amelie Baize., MD;  Location:  SURGERY CENTER;  Service: Orthopedics;  Laterality: Right;   CARPAL TUNNEL RELEASE Right 04/10/2013   Procedure: REVISION OF CARPAL TUNNEL RELEASE LIGAMENT RIGHT WRIST;  Surgeon: Amelie Baize.,  MD;  Location: [facility];  Service: Orthopedics;  Laterality: Right;   CERVICAL FUSION  03/03/2009   CHOLECYSTECTOMY     CORONARY ANGIOPLASTY WITH STENT PLACEMENT  [id number]   CORONARY ARTERY BYPASS GRAFT N/A 12/30/2013   Procedure: CORONARY ARTERY BYPASS GRAFTING (CABG) x three, using left internal mammary artery and right leg greater sapheneous vein harvested endoscopically - LIMA to LAD, SVG-D, SVG-OM1;  Surgeon: Heriberto London, MD;  Location: [facility] OR;  Service: Open Heart Surgery;  Laterality: N/A;   ELBOW ARTHROSCOPY Right    FUDUCIAL PLACEMENT  09/11/2023   Procedure: INSERTION, FIDUCIAL MARKER, GOLD;  Surgeon: Denson Flake, MD;  Location: [facility];  Service: Pulmonary;;   INTERCOSTAL NERVE BLOCK Right 10/25/2023   Procedure: BLOCK, NERVE, INTERCOSTAL;  Surgeon: Hilarie Lovely, MD;  Location: [facility] OR;  Service: Thoracic;  Laterality: Right;   IRRIGATION AND DEBRIDEMENT SHOULDER Right 04/30/2020   Procedure: Right shoulder poly exchange and possible glensphere exchange and left shoulder injection;  Surgeon: Winston Hawking, MD;  Location: [facility];  Service: Orthopedics;  Laterality: Right;   LAPAROSCOPIC GASTRIC BANDING  08/31/2012   left carpal tunnel release      LEFT HEART CATHETERIZATION WITH CORONARY ANGIOGRAM N/A 12/26/2013   Procedure: LEFT HEART CATHETERIZATION WITH CORONARY ANGIOGRAM;  Surgeon: Arleen Lacer, MD;  Location: [facility] CATH LAB;   Service: Cardiovascular;  Laterality: N/A;   LOBECTOMY, LUNG, ROBOT-ASSISTED, USING VATS Right 10/25/2023   Procedure: LOBECTOMY, RIGHT LOWER LUNG, ROBOT-ASSISTED, USING VATS;  Surgeon: Hilarie Lovely, MD;  Location: [facility];  Service: Thoracic;  Laterality: Right;  RIGHT ROBOTIC ASSISTED THORACOSCOPY FOR RIGHT LOWER LOBECTOMY   LYMPH NODE BIOPSY Right 10/25/2023   Procedure: LYMPH NODE BIOPSY;  Surgeon: Hilarie Lovely, MD;  Location: [facility];  Service: Thoracic;  Laterality: Right;   REVERSE SHOULDER ARTHROPLASTY Right 01/30/2020   Procedure: REVERSE SHOULDER ARTHROPLASTY;  Surgeon: Winston Hawking, MD;  Location: [facility];  Service: Orthopedics;  Laterality: Right;  interscalene block   REVERSE SHOULDER ARTHROPLASTY Left 03/30/2023   Procedure: REVERSE SHOULDER ARTHROPLASTY;  Surgeon: Winston Hawking, MD;  Location: [facility];  Service: Orthopedics;  Laterality: Left;   SHOULDER ARTHROSCOPY Right 10/01/2009   TOTAL KNEE ARTHROPLASTY Right 07/03/2004   TOTAL KNEE ARTHROPLASTY Left 07/03/2005   ULNAR NERVE TRANSPOSITION  06/06/2011   Procedure: ULNAR NERVE DECOMPRESSION/TRANSPOSITION;  Surgeon: Amelie Baize., MD;  Location: [facility];  Service: Orthopedics;  Laterality: Right;  decompression ulnar nerve right cubital tunnel     REVIEW OF SYSTEMS:   Review of Systems  Constitutional: Negative for appetite change, chills, fatigue, fever and unexpected weight change.  HENT: Negative for mouth sores, nosebleeds, sore throat and trouble swallowing.   Eyes: Negative for eye problems and icterus.  Respiratory: Positive for dyspnea on exertion. Negative for cough, hemoptysis, and wheezing.   Cardiovascular: Negative for chest pain and leg swelling.  Gastrointestinal: Negative for abdominal pain, constipation, diarrhea, nausea and vomiting.  Genitourinary: Negative for bladder incontinence, difficulty urinating, dysuria, frequency and hematuria.   Musculoskeletal: Negative for back  pain, gait problem, neck pain and neck stiffness.  Skin: Negative for itching and rash.  Neurological: Negative for dizziness, extremity weakness, gait problem, headaches, light-headedness and seizures.  Hematological: Negative for adenopathy. Does not bruise/bleed easily.  Psychiatric/Behavioral: Negative for confusion, depression and sleep disturbance. The patient is not nervous/anxious.     PHYSICAL EXAMINATION:  There were no vitals taken for this visit.  ECOG PERFORMANCE STATUS:  0  Physical Exam  Constitutional: Oriented to person, place, and time and well-developed, well-nourished, and in no distress.  HENT:  Head: Normocephalic and atraumatic.  Mouth/Throat: Oropharynx is clear and moist. No oropharyngeal exudate.  Eyes: Conjunctivae are normal. Right eye exhibits no discharge. Left eye exhibits no discharge. No scleral icterus.  Neck: Normal range of motion. Neck supple.  Cardiovascular: Normal rate, regular rhythm, murmur noted and intact distal pulses.   Pulmonary/Chest: Effort normal and breath sounds normal. No respiratory distress. No wheezes. No rales.  Abdominal: Soft. Bowel sounds are normal. Exhibits no distension and no mass. There is no tenderness.  Musculoskeletal: Normal range of motion. Exhibits no edema.  Lymphadenopathy:    No cervical adenopathy.  Neurological: Alert and oriented to person, place, and time. Exhibits normal muscle tone. Gait normal. Coordination normal.  Skin: Skin is warm and dry. No rash noted. Not diaphoretic. No erythema. No pallor.  Psychiatric: Mood, memory and judgment normal.  Vitals reviewed.  LABORATORY DATA: Lab Results  Component Value Date   WBC 7.0 11/22/2023   HGB 14.2 11/22/2023   HCT 41.9 11/22/2023   MCV 89.3 11/22/2023   PLT 194 11/22/2023      Chemistry      Component Value Date/Time   NA 139 11/22/2023 1521   K 4.4 11/22/2023 1521   CL 104 11/22/2023 1521   CO2 28 11/22/2023 1521   BUN 27 (H) 11/22/2023 1521    CREATININE 0.77 11/22/2023 1521      Component Value Date/Time   CALCIUM  9.4 11/22/2023 1521   ALKPHOS 48 11/22/2023 1521   AST 16 11/22/2023 1521   ALT 12 11/22/2023 1521   BILITOT 0.5 11/22/2023 1521       RADIOGRAPHIC STUDIES:  No results found.   ASSESSMENT/PLAN:  This is a very pleasant 73 year old male with Stage IIA (T2b, N0, M0) non-small cell lung cancer, adenocarcinoma. He presented with a right lower lobe lung mass. He was diagnosed in March 2025.  His PD-L1 expression is 0.  He does not have any actionable mutations.   The patient underwent surgical resection under the care of Dr. Deloise Ferries on 10/25/2023.  This measured 4.2 cm.   He is currently on adjuvant systemic chemotherapy with cisplatin on day 1 as well as Alimta 500 mg/m IV every 3 weeks for a total of 4 cycles.  His first dose of treatment is scheduled for today on 12/14/23   He does not have any questions except some clarification on his decadron   Labs were reviewed. Recommend he proceed with cycle #1 today as scheduled. The patient just used the bathroom. If he does not have to use the restroom prior to cisplatin administration, it is ok to proceed today as scheduled.   I will arrange a 1 week follow up visit for toxicity check next week.   I have messaged [facility] to ensure he has a weekly lab scheduled on 6/25  We will monitor his labs closely on a weekly basis  I reviewed his anti-emetics in detail today. He can take compazine  any day if needed for nausea. Zofran  I reviewed to take this once every 8 hours as needed starting after day 3. After day 3, he can take either zofran  or compazine  first. He will try one first and alternate if needed. If he has nausea/vomiting on Sunday and Monday that is not controlled, I let him know he can extend his decadron  for another 1-2 days.  The patient was advised to call immediately if she has any concerning symptoms in the interval. The patient  voices understanding of current disease status and treatment options and is in agreement with the current care plan. All questions were answered. The patient knows to call the clinic with any problems, questions or concerns. We can certainly see the patient much sooner if necessary         No orders of the defined types were placed in this encounter.    The total time spent in the appointment was 20-29 minutes  Peri Kreft L Hideo Googe, PA-C 12/11/23

## 2023-12-13 ENCOUNTER — Ambulatory Visit: Admitting: Physician Assistant

## 2023-12-13 ENCOUNTER — Ambulatory Visit

## 2023-12-13 ENCOUNTER — Other Ambulatory Visit

## 2023-12-13 MED FILL — Fosaprepitant Dimeglumine For IV Infusion 150 MG (Base Eq): INTRAVENOUS | Qty: 5 | Status: AC

## 2023-12-14 ENCOUNTER — Inpatient Hospital Stay: Admitting: Physician Assistant

## 2023-12-14 ENCOUNTER — Inpatient Hospital Stay

## 2023-12-14 VITALS — BP 146/67 | HR 56 | Temp 98.1°F | Resp 18 | Wt 238.9 lb

## 2023-12-14 DIAGNOSIS — C3491 Malignant neoplasm of unspecified part of right bronchus or lung: Secondary | ICD-10-CM | POA: Diagnosis not present

## 2023-12-14 DIAGNOSIS — Z79631 Long term (current) use of antimetabolite agent: Secondary | ICD-10-CM | POA: Diagnosis not present

## 2023-12-14 DIAGNOSIS — C3400 Malignant neoplasm of unspecified main bronchus: Secondary | ICD-10-CM

## 2023-12-14 DIAGNOSIS — Z902 Acquired absence of lung [part of]: Secondary | ICD-10-CM | POA: Diagnosis not present

## 2023-12-14 DIAGNOSIS — C3431 Malignant neoplasm of lower lobe, right bronchus or lung: Secondary | ICD-10-CM | POA: Diagnosis not present

## 2023-12-14 DIAGNOSIS — Z79630 Long term (current) use of alkylating agent: Secondary | ICD-10-CM | POA: Diagnosis not present

## 2023-12-14 DIAGNOSIS — Z5111 Encounter for antineoplastic chemotherapy: Secondary | ICD-10-CM | POA: Insufficient documentation

## 2023-12-14 DIAGNOSIS — R530 Neoplastic (malignant) related fatigue: Secondary | ICD-10-CM | POA: Diagnosis not present

## 2023-12-14 LAB — CMP (CANCER CENTER ONLY)
ALT: 15 U/L (ref 0–44)
AST: 18 U/L (ref 15–41)
Albumin: 4.3 g/dL (ref 3.5–5.0)
Alkaline Phosphatase: 45 U/L (ref 38–126)
Anion gap: 8 (ref 5–15)
BUN: 19 mg/dL (ref 8–23)
CO2: 24 mmol/L (ref 22–32)
Calcium: 9.3 mg/dL (ref 8.9–10.3)
Chloride: 101 mmol/L (ref 98–111)
Creatinine: 0.71 mg/dL (ref 0.61–1.24)
GFR, Estimated: >60 mL/min (ref >60)
Glucose, Bld: 141 mg/dL — ABNORMAL HIGH (ref 70–99)
Potassium: 4.2 mmol/L (ref 3.5–5.1)
Sodium: 133 mmol/L — ABNORMAL LOW (ref 135–145)
Total Bilirubin: 0.6 mg/dL (ref 0.0–1.2)
Total Protein: 7.3 g/dL (ref 6.5–8.1)

## 2023-12-14 LAB — CBC WITH DIFFERENTIAL (CANCER CENTER ONLY)
Abs Immature Granulocytes: 0.01 10*3/uL (ref 0.00–0.07)
Basophils Absolute: 0 10*3/uL (ref 0.0–0.1)
Basophils Relative: 0 %
Eosinophils Absolute: 0 10*3/uL (ref 0.0–0.5)
Eosinophils Relative: 0 %
HCT: 41.3 % (ref 39.0–52.0)
Hemoglobin: 14.1 g/dL (ref 13.0–17.0)
Immature Granulocytes: 0 %
Lymphocytes Relative: 15 %
Lymphs Abs: 0.8 10*3/uL (ref 0.7–4.0)
MCH: 30.5 pg (ref 26.0–34.0)
MCHC: 34.1 g/dL (ref 30.0–36.0)
MCV: 89.2 fL (ref 80.0–100.0)
Monocytes Absolute: 0.4 10*3/uL (ref 0.1–1.0)
Monocytes Relative: 7 %
Neutro Abs: 3.9 10*3/uL (ref 1.7–7.7)
Neutrophils Relative %: 78 %
Platelet Count: 238 10*3/uL (ref 150–400)
RBC: 4.63 MIL/uL (ref 4.22–5.81)
RDW: 13.1 % (ref 11.5–15.5)
WBC Count: 5.1 10*3/uL (ref 4.0–10.5)
nRBC: 0 % (ref 0.0–0.2)

## 2023-12-14 LAB — MAGNESIUM: Magnesium: 1.8 mg/dL (ref 1.7–2.4)

## 2023-12-14 MED ORDER — SODIUM CHLORIDE 0.9 % IV SOLN: INTRAVENOUS | Status: DC

## 2023-12-14 MED ORDER — MAGNESIUM SULFATE 2 GM/50ML IV SOLN
2.0000 g | Freq: Once | INTRAVENOUS | Status: AC
  Administered 2023-12-14: 2 g via INTRAVENOUS
  Filled 2023-12-14: qty 50

## 2023-12-14 MED ORDER — POTASSIUM CHLORIDE IN NACL 20-0.9 MEQ/L-% IV SOLN
Freq: Once | INTRAVENOUS | Status: AC
  Filled 2023-12-14: qty 1000

## 2023-12-14 MED ORDER — DEXAMETHASONE SODIUM PHOSPHATE 10 MG/ML IJ SOLN
10.0000 mg | Freq: Once | INTRAMUSCULAR | Status: AC
  Administered 2023-12-14: 10 mg via INTRAVENOUS
  Filled 2023-12-14: qty 1

## 2023-12-14 MED ORDER — PALONOSETRON HCL INJECTION 0.25 MG/5ML
0.2500 mg | Freq: Once | INTRAVENOUS | Status: AC
  Administered 2023-12-14: 0.25 mg via INTRAVENOUS
  Filled 2023-12-14: qty 5

## 2023-12-14 MED ORDER — SODIUM CHLORIDE 0.9 % IV SOLN
75.0000 mg/m2 | Freq: Once | INTRAVENOUS | Status: AC
  Administered 2023-12-14: 168 mg via INTRAVENOUS
  Filled 2023-12-14: qty 168

## 2023-12-14 MED ORDER — SODIUM CHLORIDE 0.9 % IV SOLN
150.0000 mg | Freq: Once | INTRAVENOUS | Status: AC
  Administered 2023-12-14: 150 mg via INTRAVENOUS
  Filled 2023-12-14: qty 150

## 2023-12-14 MED ORDER — SODIUM CHLORIDE 0.9 % IV SOLN
500.0000 mg/m2 | Freq: Once | INTRAVENOUS | Status: AC
  Administered 2023-12-14: 1100 mg via INTRAVENOUS
  Filled 2023-12-14: qty 40

## 2023-12-14 NOTE — Patient Instructions (Signed)
 [facility] - A DEPT OF [facility]  Discharge Instructions: Thank you for choosing [facility] to provide your oncology and hematology care.   If you have a lab appointment with the Cancer Center, please go directly to the Cancer Center and check in at the registration area.   Wear comfortable clothing and clothing appropriate for easy access to any Portacath or PICC line.   We strive to give you quality time with your provider. You may need to reschedule your appointment if you arrive late (15 or more minutes).  Arriving late affects you and other patients whose appointments are after yours.  Also, if you miss three or more appointments without notifying the office, you may be dismissed from the clinic at the provider's discretion.      For prescription refill requests, have your pharmacy contact our office and allow 72 hours for refills to be completed.    Today you received the following chemotherapy and/or immunotherapy agents: pemetrexed and cisplatin      To help prevent nausea and vomiting after your treatment, we encourage you to take your nausea medication as directed.  BELOW ARE SYMPTOMS THAT SHOULD BE REPORTED IMMEDIATELY: *FEVER GREATER THAN 100.4 F (38 C) OR HIGHER *CHILLS OR SWEATING *NAUSEA AND VOMITING THAT IS NOT CONTROLLED WITH YOUR NAUSEA MEDICATION *UNUSUAL SHORTNESS OF BREATH *UNUSUAL BRUISING OR BLEEDING *URINARY PROBLEMS (pain or burning when urinating, or frequent urination) *BOWEL PROBLEMS (unusual diarrhea, constipation, pain near the anus) TENDERNESS IN MOUTH AND THROAT WITH OR WITHOUT PRESENCE OF ULCERS (sore throat, sores in mouth, or a toothache) UNUSUAL RASH, SWELLING OR PAIN  UNUSUAL VAGINAL DISCHARGE OR ITCHING   Items with * indicate a potential emergency and should be followed up as soon as possible or go to the Emergency Department if any problems should occur.  Please show the CHEMOTHERAPY ALERT CARD or  IMMUNOTHERAPY ALERT CARD at check-in to the Emergency Department and triage nurse.  Should you have questions after your visit or need to cancel or reschedule your appointment, please contact [facility] - A DEPT OF [facility]  Dept: [phone]  and follow the prompts.  Office hours are 8:00 a.m. to 4:30 p.m. Monday - Friday. Please note that voicemails left after 4:00 p.m. may not be returned until the following business day.  We are closed weekends and major holidays. You have access to a nurse at all times for urgent questions. Please call the main number to the clinic Dept: [phone] and follow the prompts.   For any non-urgent questions, you may also contact your provider using MyChart. We now offer e-Visits for anyone 41 and older to request care online for non-urgent symptoms. For details visit [email or web address].   Also download the MyChart app! Go to the app store, search MyChart, open the app, select [facility], and log in with your MyChart username and password.

## 2023-12-17 ENCOUNTER — Telehealth: Payer: Self-pay

## 2023-12-17 ENCOUNTER — Other Ambulatory Visit: Payer: Self-pay | Admitting: Thoracic Surgery (Cardiothoracic Vascular Surgery)

## 2023-12-17 DIAGNOSIS — Z902 Acquired absence of lung [part of]: Secondary | ICD-10-CM

## 2023-12-17 NOTE — Telephone Encounter (Signed)
 LM for patient that this nurse was calling to see how they were doing after their treatment. Please call back to Dr. Asa Lente nurse at [phone] if they have any questions or concerns regarding the treatment.

## 2023-12-17 NOTE — Telephone Encounter (Signed)
-----   Message from Nurse Ladena Picking sent at 12/14/2023  4:05 PM EDT ----- Regarding: 1st Time Alimta/Cisplatin-Dr Marguerita Shih 1st Time Alimta/Cisplatin-Dr Mohamed No issues during treatment. Please follow up.

## 2023-12-18 ENCOUNTER — Ambulatory Visit: Payer: Self-pay | Attending: Thoracic Surgery (Cardiothoracic Vascular Surgery)

## 2023-12-18 ENCOUNTER — Ambulatory Visit (HOSPITAL_COMMUNITY)
Admission: RE | Admit: 2023-12-18 | Discharge: 2023-12-18 | Discharge status: Home / Self Care | Source: Ambulatory Visit | Attending: Cardiovascular Disease | Admitting: Cardiovascular Disease

## 2023-12-18 VITALS — BP 126/78 | HR 61 | Resp 18 | Ht 66.0 in | Wt 236.0 lb

## 2023-12-18 DIAGNOSIS — Z48813 Encounter for surgical aftercare following surgery on the respiratory system: Secondary | ICD-10-CM | POA: Insufficient documentation

## 2023-12-18 DIAGNOSIS — Z9889 Other specified postprocedural states: Secondary | ICD-10-CM

## 2023-12-18 DIAGNOSIS — Z96612 Presence of left artificial shoulder joint: Secondary | ICD-10-CM | POA: Diagnosis not present

## 2023-12-18 DIAGNOSIS — Z96611 Presence of right artificial shoulder joint: Secondary | ICD-10-CM | POA: Diagnosis not present

## 2023-12-18 DIAGNOSIS — Z902 Acquired absence of lung [part of]: Secondary | ICD-10-CM | POA: Diagnosis not present

## 2023-12-18 NOTE — Progress Notes (Unsigned)
 Mr. Hoben is a pleasant 73 year-old male with a past medical history of robotic assisted lung thoracoscopy for right lower lobectomy. With pathology revealing adenocarcinoma. He is following up to to evaluate his post operative recovery and review his imaging. He denies chest pain and shortness of breath with and without exertion. He does admit to feeling nauseated from time-to-time. However, he as recently started IV chemotherapy. He believes his nausea is related to above. He has no complaints at this time.    Physical Exam:  General: Well appearing gentleman in no acute distress. Cardiac: Heart sounds are normal. No rubs, murmurs, or gallops. No extremity edema.  Pulmonary: L. Side - clear lung sounds. R side - slightly diminished in the lower lobe region, otherwise clear. Excellent effort with deep inhalation and exhalation. CXR is stable from previous.  Skin/Derm: Incision sites are clean, dry, and intact.   Assessment and Plan: Continue daily activities as tolerated. Continue taking medications are prescribed.  Schedule follow up for continual surveillance of your lung cancer.

## 2023-12-19 ENCOUNTER — Inpatient Hospital Stay: Admitting: Internal Medicine

## 2023-12-19 ENCOUNTER — Other Ambulatory Visit: Payer: Self-pay | Admitting: Physician Assistant

## 2023-12-19 ENCOUNTER — Encounter: Payer: Self-pay | Admitting: Physician Assistant

## 2023-12-19 ENCOUNTER — Inpatient Hospital Stay

## 2023-12-19 VITALS — BP 128/62 | HR 62 | Temp 97.5°F | Resp 18 | Ht 66.0 in | Wt 233.6 lb

## 2023-12-19 DIAGNOSIS — Z79630 Long term (current) use of alkylating agent: Secondary | ICD-10-CM | POA: Diagnosis not present

## 2023-12-19 DIAGNOSIS — Z902 Acquired absence of lung [part of]: Secondary | ICD-10-CM | POA: Diagnosis not present

## 2023-12-19 DIAGNOSIS — R530 Neoplastic (malignant) related fatigue: Secondary | ICD-10-CM | POA: Diagnosis not present

## 2023-12-19 DIAGNOSIS — Z5111 Encounter for antineoplastic chemotherapy: Secondary | ICD-10-CM | POA: Diagnosis not present

## 2023-12-19 DIAGNOSIS — C3491 Malignant neoplasm of unspecified part of right bronchus or lung: Secondary | ICD-10-CM

## 2023-12-19 DIAGNOSIS — C3431 Malignant neoplasm of lower lobe, right bronchus or lung: Secondary | ICD-10-CM

## 2023-12-19 DIAGNOSIS — Z79631 Long term (current) use of antimetabolite agent: Secondary | ICD-10-CM | POA: Diagnosis not present

## 2023-12-19 LAB — CMP (CANCER CENTER ONLY)
ALT: 16 U/L (ref 0–44)
AST: 13 U/L — ABNORMAL LOW (ref 15–41)
Albumin: 4 g/dL (ref 3.5–5.0)
Alkaline Phosphatase: 39 U/L (ref 38–126)
Anion gap: 7 (ref 5–15)
BUN: 27 mg/dL — ABNORMAL HIGH (ref 8–23)
CO2: 25 mmol/L (ref 22–32)
Calcium: 9.4 mg/dL (ref 8.9–10.3)
Chloride: 100 mmol/L (ref 98–111)
Creatinine: 0.77 mg/dL (ref 0.61–1.24)
GFR, Estimated: >60 mL/min (ref >60)
Glucose, Bld: 104 mg/dL — ABNORMAL HIGH (ref 70–99)
Potassium: 4.1 mmol/L (ref 3.5–5.1)
Sodium: 132 mmol/L — ABNORMAL LOW (ref 135–145)
Total Bilirubin: 0.5 mg/dL (ref 0.0–1.2)
Total Protein: 6.7 g/dL (ref 6.5–8.1)

## 2023-12-19 LAB — CBC WITH DIFFERENTIAL (CANCER CENTER ONLY)
Abs Immature Granulocytes: 0.02 10*3/uL (ref 0.00–0.07)
Basophils Absolute: 0 10*3/uL (ref 0.0–0.1)
Basophils Relative: 0 %
Eosinophils Absolute: 0.3 10*3/uL (ref 0.0–0.5)
Eosinophils Relative: 3 %
HCT: 43 % (ref 39.0–52.0)
Hemoglobin: 14.8 g/dL (ref 13.0–17.0)
Immature Granulocytes: 0 %
Lymphocytes Relative: 26 %
Lymphs Abs: 2.5 10*3/uL (ref 0.7–4.0)
MCH: 30 pg (ref 26.0–34.0)
MCHC: 34.4 g/dL (ref 30.0–36.0)
MCV: 87.2 fL (ref 80.0–100.0)
Monocytes Absolute: 0.8 10*3/uL (ref 0.1–1.0)
Monocytes Relative: 8 %
Neutro Abs: 5.8 10*3/uL (ref 1.7–7.7)
Neutrophils Relative %: 63 %
Platelet Count: 234 10*3/uL (ref 150–400)
RBC: 4.93 MIL/uL (ref 4.22–5.81)
RDW: 12.8 % (ref 11.5–15.5)
WBC Count: 9.4 10*3/uL (ref 4.0–10.5)
nRBC: 0 % (ref 0.0–0.2)

## 2023-12-19 LAB — MAGNESIUM: Magnesium: 1.6 mg/dL — ABNORMAL LOW (ref 1.7–2.4)

## 2023-12-19 MED ORDER — MAGNESIUM OXIDE -MG SUPPLEMENT 400 (240 MG) MG PO TABS: 400.0000 mg | ORAL_TABLET | Freq: Every day | ORAL | 0 refills | Status: AC

## 2023-12-19 NOTE — Progress Notes (Signed)
 [facility] [phone]   [phone]  OFFICE PROGRESS NOTE  [name], MD [address] [place] [address]  DIAGNOSIS: Stage IIA (T2b, N0, M0) non-small cell lung cancer, adenocarcinoma. He presented with a right lower lobe lung mass. He was diagnosed in March 2025   Biomarker Findings HRD signature - HRDsig Negative Microsatellite status - MS-Stable Tumor Mutational Burden - 0 Muts/Mb Genomic Findings For a complete list of the genes assayed, please refer to the Appendix. CDKN2A loss KRAS G12V STK11 loss CDKN2B loss 7 Disease relevant genes with no reportable alterations: ALK, BRAF, EGFR, ERBB2, MET, RET, ROS1   PDL1: 0%   PRIOR THERAPY: 1) s/p Right lower lobectomy for  under the care of Dr. Deloise Ferries which was completed on 10/25/23    CURRENT THERAPY: Adjuvant chemotherapy with cisplatin 75 mg/m2 and alimta 500 mg/m2 IV every 3 weeks. First dose expected 12/14/23   INTERVAL HISTORY: [name] 73 y.o. male returns to the clinic today for follow-up visit.Discussed the use of AI scribe software for clinical note transcription with the patient, who gave verbal consent to proceed.  History of Present Illness   [name] is a 73 year old male with stage two A non-small cell lung cancer who presents for evaluation and management of adverse effects of chemotherapy.  He was diagnosed with stage two A non-small cell lung cancer, adenocarcinoma, in March 2025 and underwent a right lower lobectomy with lymph node sampling on October 22, 2023. He is currently receiving adjuvant systemic chemotherapy with cisplatin and Alimta, having completed one cycle last week.  He experiences fatigue but no nausea or vomiting, which are common delayed side effects of his treatment. Initially, he slept well for the first two nights post-treatment but has had difficulty sleeping the last couple of nights, describing it as 'tossing and turning' and  'thinking about stuff'. No aching or pain. His current medications include cisplatin and Alimta, administered every three weeks.  No nausea, vomiting, or aching. He reports fatigue and difficulty sleeping.       MEDICAL HISTORY: Past Medical History:  Diagnosis Date   Aortic root dilatation (HCC)    Arthritis    Coronary atherosclerosis of native coronary artery    Multiple stents - RCA/LAD/diagonal, overlapping DES LAD, stent thrombosis 2005, ultimately CABG   Essential hypertension    H/O hiatal hernia    History of GI bleed    On nonsteroidals   Hyperlipemia    Myocardial infarction [facility])    Anterior with LAD stent thrombosis 2005, 2004 and 2005    Obesity    Paroxysmal atrial flutter (HCC)    Sleep apnea    CPAP daily    ALLERGIES:  is allergic to bee venom, other, and chlorhexidine .  MEDICATIONS:  Current Outpatient Medications  Medication Sig Dispense Refill   acetaminophen  (TYLENOL ) 500 MG tablet Take 1-2 tablets (500-1,000 mg total) by mouth every 6 (six) hours as needed.     aspirin  81 MG EC tablet Take 81 mg by mouth at bedtime.      atorvastatin  (LIPITOR) 20 MG tablet Take 1 tablet (20 mg total) by mouth every evening.     b complex vitamins tablet 1 tablet daily. B12-B6-Folic Acid  (Sublingual)     Cholecalciferol  (VITAMIN D ) 125 MCG (5000 UT) CAPS Take 5,000 Units by mouth daily.      Coenzyme Q10 (CO Q-10) 100 MG CAPS Take 100 mg by mouth  daily.      cyclobenzaprine  (FLEXERIL ) 10 MG tablet Take 1 tablet (10 mg total) by mouth 3 (three) times daily as needed for muscle spasms. 30 tablet 0   dexamethasone  (DECADRON ) 4 MG tablet Take 1 tablet twice a day the day before, the day of, and the day after chemotherapy 40 tablet 2   EPINEPHrine  0.3 mg/0.3 mL IJ SOAJ injection Inject 0.3 mLs into the skin as needed for anaphylaxis.      folic acid  (FOLVITE ) 1 MG tablet Take 1 tablet (1 mg total) by mouth daily. 30 tablet 2   gabapentin  (NEURONTIN ) 300 MG capsule Take 1  capsule (300 mg total) by mouth at bedtime for 2 days, THEN 1 capsule (300 mg total) 2 (two) times daily. 62 capsule 0   Krill Oil 1000 MG CAPS Take 1,000 mg by mouth daily.     loratadine  (CLARITIN ) 10 MG tablet Take 10 mg by mouth daily as needed for allergies.     losartan  (COZAAR ) 100 MG tablet Take 100 mg by mouth daily.     LYCOPENE PO Take 1 tablet by mouth daily.     metoprolol  (LOPRESSOR ) 50 MG tablet Take 50 mg by mouth 2 (two) times daily.     MOUNJARO 15 MG/0.5ML Pen Inject 15 mg into the skin once a week. Mondays     Multiple Vitamins-Minerals (EYE VITAMINS PO) Take 1 capsule by mouth daily. [employer] [facility]     naproxen  (NAPROSYN ) 500 MG tablet Take 500 mg by mouth 2 (two) times daily with a meal.     nitroGLYCERIN  (NITROSTAT ) 0.4 MG SL tablet Place 0.4 mg under the tongue every 5 (five) minutes as needed for chest pain. Up to 3x     OLIVE LEAF EXTRACT PO Take 1 capsule by mouth daily. 20,000 mg     ondansetron  (ZOFRAN ) 8 MG tablet Take 1 tablet (8 mg total) by mouth every 8 (eight) hours as needed for nausea or vomiting. Starting day 3 after chemo 30 tablet 2   Pomegranate, Pun[place] granatum, (POMEGRANATE PO) Take 5,000 mg by mouth daily.     prochlorperazine  (COMPAZINE ) 10 MG tablet Take 1 tablet (10 mg total) by mouth every 6 (six) hours as needed. 30 tablet 2   Tadalafil  2.5 MG TABS Take 2.5 mg by mouth every morning.     Turmeric 500 MG TABS Take 500 mg by mouth 2 (two) times daily.      No current facility-administered medications for this visit.    SURGICAL HISTORY:  Past Surgical History:  Procedure Laterality Date   APPENDECTOMY     BREATH TEK H PYLORI  06/11/2012   Procedure: BREATH TEK H PYLORI;  Surgeon: Thayne Fine, MD;  Location: [facility];  Service: General;  Laterality: N/ACain Castillo   BRONCHIAL BIOPSY  09/11/2023   Procedure: BRONCHOSCOPY, WITH BIOPSY;  Surgeon: Denson Flake, MD;  Location: [facility];  Service: Pulmonary;;   BRONCHIAL BRUSHINGS  09/11/2023    Procedure: BRONCHOSCOPY, WITH BRUSH BIOPSY;  Surgeon: Denson Flake, MD;  Location: [facility];  Service: Pulmonary;;   BRONCHIAL NEEDLE ASPIRATION BIOPSY  09/11/2023   Procedure: BRONCHOSCOPY, WITH NEEDLE ASPIRATION BIOPSY;  Surgeon: Denson Flake, MD;  Location: [facility];  Service: Pulmonary;;   CARDIAC SURGERY     CARPAL TUNNEL RELEASE Right 12/02/2010   CARPAL TUNNEL RELEASE  06/06/2011   Procedure: CARPAL TUNNEL RELEASE;  Surgeon: Amelie Baize., MD;  Location: [facility];  Service:  Orthopedics;  Laterality: Right;   CARPAL TUNNEL RELEASE Right 04/10/2013   Procedure: REVISION OF CARPAL TUNNEL RELEASE LIGAMENT RIGHT WRIST;  Surgeon: Amelie Baize., MD;  Location: [facility];  Service: Orthopedics;  Laterality: Right;   CERVICAL FUSION  03/03/2009   CHOLECYSTECTOMY     CORONARY ANGIOPLASTY WITH STENT PLACEMENT  [id number]   CORONARY ARTERY BYPASS GRAFT N/A 12/30/2013   Procedure: CORONARY ARTERY BYPASS GRAFTING (CABG) x three, using left internal mammary artery and right leg greater sapheneous vein harvested endoscopically - LIMA to LAD, SVG-D, SVG-OM1;  Surgeon: Heriberto London, MD;  Location: [facility] OR;  Service: Open Heart Surgery;  Laterality: N/A;   ELBOW ARTHROSCOPY Right    FUDUCIAL PLACEMENT  09/11/2023   Procedure: INSERTION, FIDUCIAL MARKER, GOLD;  Surgeon: Denson Flake, MD;  Location: [facility];  Service: Pulmonary;;   INTERCOSTAL NERVE BLOCK Right 10/25/2023   Procedure: BLOCK, NERVE, INTERCOSTAL;  Surgeon: Hilarie Lovely, MD;  Location: [facility];  Service: Thoracic;  Laterality: Right;   IRRIGATION AND DEBRIDEMENT SHOULDER Right 04/30/2020   Procedure: Right shoulder poly exchange and possible glensphere exchange and left shoulder injection;  Surgeon: Winston Hawking, MD;  Location: [facility];  Service: Orthopedics;  Laterality: Right;   LAPAROSCOPIC GASTRIC BANDING  08/31/2012   left carpal tunnel release      LEFT HEART  CATHETERIZATION WITH CORONARY ANGIOGRAM N/A 12/26/2013   Procedure: LEFT HEART CATHETERIZATION WITH CORONARY ANGIOGRAM;  Surgeon: Arleen Lacer, MD;  Location: [facility] C[facility];  Service: Cardiovascular;  Laterality: N/A;   LOBECTOMY, LUNG, ROBOT-ASSISTED, USING VATS Right 10/25/2023   Procedure: LOBECTOMY, RIGHT LOWER LUNG, ROBOT-ASSISTED, USING VATS;  Surgeon: Hilarie Lovely, MD;  Location: [facility];  Service: Thoracic;  Laterality: Right;  RIGHT ROBOTIC ASSISTED THORACOSCOPY FOR RIGHT LOWER LOBECTOMY   LYMPH NODE BIOPSY Right 10/25/2023   Procedure: LYMPH NODE BIOPSY;  Surgeon: Hilarie Lovely, MD;  Location: [facility];  Service: Thoracic;  Laterality: Right;   REVERSE SHOULDER ARTHROPLASTY Right 01/30/2020   Procedure: REVERSE SHOULDER ARTHROPLASTY;  Surgeon: Winston Hawking, MD;  Location: [facility];  Service: Orthopedics;  Laterality: Right;  interscalene block   REVERSE SHOULDER ARTHROPLASTY Left 03/30/2023   Procedure: REVERSE SHOULDER ARTHROPLASTY;  Surgeon: Winston Hawking, MD;  Location: [facility];  Service: Orthopedics;  Laterality: Left;   SHOULDER ARTHROSCOPY Right 10/01/2009   TOTAL KNEE ARTHROPLASTY Right 07/03/2004   TOTAL KNEE ARTHROPLASTY Left 07/03/2005   ULNAR NERVE TRANSPOSITION  06/06/2011   Procedure: ULNAR NERVE DECOMPRESSION/TRANSPOSITION;  Surgeon: Amelie Baize., MD;  Location: [facility];  Service: Orthopedics;  Laterality: Right;  decompression ulnar nerve right cubital tunnel     REVIEW OF SYSTEMS:  A comprehensive review of systems was negative except for: Constitutional: positive for fatigue   PHYSICAL EXAMINATION: General appearance: alert, cooperative, fatigued, and no distress Head: Normocephalic, without obvious abnormality, atraumatic Neck: no adenopathy, no JVD, supple, symmetrical, trachea midline, and thyroid  not enlarged, symmetric, no tenderness/mass/nodules Lymph nodes: Cervical, supraclavicular, and axillary nodes normal. Resp: clear to  auscultation bilaterally Back: symmetric, no curvature. ROM normal. No CVA tenderness. Cardio: regular rate and rhythm, S1, S2 normal, no murmur, click, rub or gallop GI: soft, non-tender; bowel sounds normal; no masses,  no organomegaly Extremities: extremities normal, atraumatic, no cyanosis or edema  ECOG PERFORMANCE STATUS: 1 - Symptomatic but completely ambulatory  There were no vitals taken for this visit.  LABORATORY DATA: Lab Results  Component Value Date  WBC 9.4 12/19/2023   HGB 14.8 12/19/2023   HCT 43.0 12/19/2023   MCV 87.2 12/19/2023   PLT 234 12/19/2023      Chemistry      Component Value Date/Time   NA 133 (L) 12/14/2023 0858   K 4.2 12/14/2023 0858   CL 101 12/14/2023 0858   CO2 24 12/14/2023 0858   BUN 19 12/14/2023 0858   CREATININE 0.71 12/14/2023 0858      Component Value Date/Time   CALCIUM  9.3 12/14/2023 0858   ALKPHOS 45 12/14/2023 0858   AST 18 12/14/2023 0858   ALT 15 12/14/2023 0858   BILITOT 0.6 12/14/2023 0858       RADIOGRAPHIC STUDIES: DG Chest 2 View Result Date: 12/18/2023 CLINICAL DATA:  Postop right lower lobectomy 10/25/2023. EXAM: CHEST - 2 VIEW COMPARISON:  Radiographs 11/09/2023 and 10/26/2023.  CT 09/05/2023. FINDINGS: The heart size and mediastinal contours are stable status post median sternotomy and CABG. Stable volume loss in the right hemithorax with associated right costophrenic angle blunting. No evidence of pneumothorax or enlarging pleural effusion. The lungs appear clear. Additional postsurgical changes are stable, including a gastric laparoscopic band and previous reverse shoulder arthroplasty bilaterally. IMPRESSION: Stable postsurgical changes in the right hemithorax. No evidence of pneumothorax or other acute cardiopulmonary process. Electronically Signed   By: Elmon Hagedorn M.D.   On: 12/18/2023 15:20    ASSESSMENT AND PLAN: This is a very pleasant 73 years old white male with Stage IIA (T2b, N0, M0) non-small cell  lung cancer, adenocarcinoma. He presented with a right lower lobe lung mass. He was diagnosed in March 2025.  He is status post right lower lobectomy with lymph node sampling under the care of Dr. Deloise Ferries.  The patient is currently undergoing adjuvant systemic chemotherapy with cisplatin 75 Mg/M2 and Alimta 500 Mg/M2 every 3 weeks.  He is status post 1 cycle. Assessment and Plan    Stage 2A non-small cell lung cancer, adenocarcinoma Status post right lower lobectomy with lymph node sampling. Currently undergoing adjuvant systemic chemotherapy with cisplatin and Alimta. Post one cycle of chemotherapy with no significant adverse effects such as nausea or vomiting. Laboratory results show normal CBC. The treatment aims to reduce the risk of cancer recurrence. - Continue adjuvant chemotherapy with cisplatin and Alimta every three weeks. - Monitor CBC and chemistry. - Reassure regarding abnormal monocyte count unless further issues arise.  Fatigue due to chemotherapy Reports fatigue following the first cycle of chemotherapy, a common side effect. No other significant symptoms such as pain or nausea reported. - Reassure about the expected fatigue due to chemotherapy.   The patient was advised to call immediately if he has any concerning symptoms in the interval. The patient voices understanding of current disease status and treatment options and is in agreement with the current care plan.  All questions were answered. The patient knows to call the clinic with any problems, questions or concerns. We can certainly see the patient much sooner if necessary. The total time spent in the appointment was 20 minutes including review of chart and various tests results, discussions about plan of care and coordination of care plan .   Disclaimer: This note was dictated with voice recognition software. Similar sounding words can inadvertently be transcribed and may not be corrected upon review.

## 2023-12-25 ENCOUNTER — Other Ambulatory Visit: Payer: Self-pay

## 2023-12-25 DIAGNOSIS — C3431 Malignant neoplasm of lower lobe, right bronchus or lung: Secondary | ICD-10-CM

## 2023-12-26 ENCOUNTER — Inpatient Hospital Stay

## 2023-12-26 DIAGNOSIS — C3431 Malignant neoplasm of lower lobe, right bronchus or lung: Secondary | ICD-10-CM

## 2023-12-26 DIAGNOSIS — Z79631 Long term (current) use of antimetabolite agent: Secondary | ICD-10-CM | POA: Diagnosis not present

## 2023-12-26 DIAGNOSIS — Z902 Acquired absence of lung [part of]: Secondary | ICD-10-CM | POA: Diagnosis not present

## 2023-12-26 DIAGNOSIS — R530 Neoplastic (malignant) related fatigue: Secondary | ICD-10-CM | POA: Diagnosis not present

## 2023-12-26 DIAGNOSIS — Z5111 Encounter for antineoplastic chemotherapy: Secondary | ICD-10-CM | POA: Diagnosis not present

## 2023-12-26 DIAGNOSIS — Z79630 Long term (current) use of alkylating agent: Secondary | ICD-10-CM | POA: Diagnosis not present

## 2023-12-26 LAB — CBC WITH DIFFERENTIAL/PLATELET
Abs Immature Granulocytes: 0.01 10*3/uL (ref 0.00–0.07)
Basophils Absolute: 0 10*3/uL (ref 0.0–0.1)
Basophils Relative: 0 %
Eosinophils Absolute: 0.2 10*3/uL (ref 0.0–0.5)
Eosinophils Relative: 3 %
HCT: 40.7 % (ref 39.0–52.0)
Hemoglobin: 13.8 g/dL (ref 13.0–17.0)
Immature Granulocytes: 0 %
Lymphocytes Relative: 36 %
Lymphs Abs: 1.9 10*3/uL (ref 0.7–4.0)
MCH: 31.2 pg (ref 26.0–34.0)
MCHC: 33.9 g/dL (ref 30.0–36.0)
MCV: 91.9 fL (ref 80.0–100.0)
Monocytes Absolute: 1.4 10*3/uL — ABNORMAL HIGH (ref 0.1–1.0)
Monocytes Relative: 26 %
Neutro Abs: 1.9 10*3/uL (ref 1.7–7.7)
Neutrophils Relative %: 35 %
Platelets: 226 10*3/uL (ref 150–400)
RBC: 4.43 MIL/uL (ref 4.22–5.81)
RDW: 13.3 % (ref 11.5–15.5)
WBC: 5.4 10*3/uL (ref 4.0–10.5)
nRBC: 0 % (ref 0.0–0.2)

## 2023-12-26 LAB — COMPREHENSIVE METABOLIC PANEL WITH GFR
ALT: 19 U/L (ref 0–44)
AST: 18 U/L (ref 15–41)
Albumin: 3.8 g/dL (ref 3.5–5.0)
Alkaline Phosphatase: 44 U/L (ref 38–126)
Anion gap: 12 (ref 5–15)
BUN: 28 mg/dL — ABNORMAL HIGH (ref 8–23)
CO2: 21 mmol/L — ABNORMAL LOW (ref 22–32)
Calcium: 9.1 mg/dL (ref 8.9–10.3)
Chloride: 101 mmol/L (ref 98–111)
Creatinine, Ser: 0.72 mg/dL (ref 0.61–1.24)
GFR, Estimated: >60 mL/min (ref >60)
Glucose, Bld: 104 mg/dL — ABNORMAL HIGH (ref 70–99)
Potassium: 4.5 mmol/L (ref 3.5–5.1)
Sodium: 134 mmol/L — ABNORMAL LOW (ref 135–145)
Total Bilirubin: 0.6 mg/dL (ref 0.0–1.2)
Total Protein: 6.7 g/dL (ref 6.5–8.1)

## 2024-01-02 MED FILL — Fosaprepitant Dimeglumine For IV Infusion 150 MG (Base Eq): INTRAVENOUS | Qty: 5 | Status: AC

## 2024-01-03 ENCOUNTER — Inpatient Hospital Stay: Attending: Physician Assistant

## 2024-01-03 ENCOUNTER — Inpatient Hospital Stay: Admitting: Internal Medicine

## 2024-01-03 ENCOUNTER — Inpatient Hospital Stay

## 2024-01-03 VITALS — BP 137/79 | Temp 98.2°F | Resp 19 | Ht 66.0 in | Wt 243.1 lb

## 2024-01-03 DIAGNOSIS — Z79631 Long term (current) use of antimetabolite agent: Secondary | ICD-10-CM | POA: Diagnosis not present

## 2024-01-03 DIAGNOSIS — C3431 Malignant neoplasm of lower lobe, right bronchus or lung: Secondary | ICD-10-CM | POA: Insufficient documentation

## 2024-01-03 DIAGNOSIS — Z5111 Encounter for antineoplastic chemotherapy: Secondary | ICD-10-CM | POA: Insufficient documentation

## 2024-01-03 DIAGNOSIS — C3400 Malignant neoplasm of unspecified main bronchus: Secondary | ICD-10-CM

## 2024-01-03 DIAGNOSIS — Z79630 Long term (current) use of alkylating agent: Secondary | ICD-10-CM | POA: Insufficient documentation

## 2024-01-03 LAB — CMP (CANCER CENTER ONLY)
ALT: 15 U/L (ref 0–44)
AST: 15 U/L (ref 15–41)
Albumin: 4 g/dL (ref 3.5–5.0)
Alkaline Phosphatase: 44 U/L (ref 38–126)
Anion gap: 9 (ref 5–15)
BUN: 24 mg/dL — ABNORMAL HIGH (ref 8–23)
CO2: 24 mmol/L (ref 22–32)
Calcium: 9.3 mg/dL (ref 8.9–10.3)
Chloride: 103 mmol/L (ref 98–111)
Creatinine: 0.67 mg/dL (ref 0.61–1.24)
GFR, Estimated: >60 mL/min (ref >60)
Glucose, Bld: 156 mg/dL — ABNORMAL HIGH (ref 70–99)
Potassium: 4.2 mmol/L (ref 3.5–5.1)
Sodium: 136 mmol/L (ref 135–145)
Total Bilirubin: 0.4 mg/dL (ref 0.0–1.2)
Total Protein: 6.8 g/dL (ref 6.5–8.1)

## 2024-01-03 LAB — CBC WITH DIFFERENTIAL (CANCER CENTER ONLY)
Abs Immature Granulocytes: 0.01 10*3/uL (ref 0.00–0.07)
Basophils Absolute: 0 10*3/uL (ref 0.0–0.1)
Basophils Relative: 0 %
Eosinophils Absolute: 0 10*3/uL (ref 0.0–0.5)
Eosinophils Relative: 0 %
HCT: 38.1 % — ABNORMAL LOW (ref 39.0–52.0)
Hemoglobin: 13 g/dL (ref 13.0–17.0)
Immature Granulocytes: 0 %
Lymphocytes Relative: 15 %
Lymphs Abs: 0.9 10*3/uL (ref 0.7–4.0)
MCH: 30.2 pg (ref 26.0–34.0)
MCHC: 34.1 g/dL (ref 30.0–36.0)
MCV: 88.6 fL (ref 80.0–100.0)
Monocytes Absolute: 0.6 10*3/uL (ref 0.1–1.0)
Monocytes Relative: 11 %
Neutro Abs: 4.3 10*3/uL (ref 1.7–7.7)
Neutrophils Relative %: 74 %
Platelet Count: 256 10*3/uL (ref 150–400)
RBC: 4.3 MIL/uL (ref 4.22–5.81)
RDW: 13.7 % (ref 11.5–15.5)
WBC Count: 5.8 10*3/uL (ref 4.0–10.5)
nRBC: 0 % (ref 0.0–0.2)

## 2024-01-03 LAB — MAGNESIUM: Magnesium: 1.8 mg/dL (ref 1.7–2.4)

## 2024-01-03 MED ORDER — DEXAMETHASONE SODIUM PHOSPHATE 10 MG/ML IJ SOLN
10.0000 mg | Freq: Once | INTRAMUSCULAR | Status: AC
  Administered 2024-01-03: 10 mg via INTRAVENOUS
  Filled 2024-01-03: qty 1

## 2024-01-03 MED ORDER — SODIUM CHLORIDE 0.9 % IV SOLN: INTRAVENOUS | Status: DC

## 2024-01-03 MED ORDER — MAGNESIUM SULFATE 2 GM/50ML IV SOLN
2.0000 g | Freq: Once | INTRAVENOUS | Status: AC
  Administered 2024-01-03: 2 g via INTRAVENOUS
  Filled 2024-01-03: qty 50

## 2024-01-03 MED ORDER — SODIUM CHLORIDE 0.9 % IV SOLN
75.0000 mg/m2 | Freq: Once | INTRAVENOUS | Status: AC
  Administered 2024-01-03: 168 mg via INTRAVENOUS
  Filled 2024-01-03: qty 168

## 2024-01-03 MED ORDER — POTASSIUM CHLORIDE IN NACL 20-0.9 MEQ/L-% IV SOLN
Freq: Once | INTRAVENOUS | Status: AC
  Filled 2024-01-03: qty 1000

## 2024-01-03 MED ORDER — PALONOSETRON HCL INJECTION 0.25 MG/5ML
0.2500 mg | Freq: Once | INTRAVENOUS | Status: AC
  Administered 2024-01-03: 0.25 mg via INTRAVENOUS
  Filled 2024-01-03: qty 5

## 2024-01-03 MED ORDER — SODIUM CHLORIDE 0.9 % IV SOLN
150.0000 mg | Freq: Once | INTRAVENOUS | Status: AC
  Administered 2024-01-03: 150 mg via INTRAVENOUS
  Filled 2024-01-03: qty 150

## 2024-01-03 MED ORDER — SODIUM CHLORIDE 0.9 % IV SOLN
500.0000 mg/m2 | Freq: Once | INTRAVENOUS | Status: AC
  Administered 2024-01-03: 1100 mg via INTRAVENOUS
  Filled 2024-01-03: qty 40

## 2024-01-03 NOTE — Progress Notes (Signed)
 [facility] [phone]   [phone]  OFFICE PROGRESS NOTE  Matthew Elspeth BRAVO, [name] [address] [place] [address]  DIAGNOSIS: Stage IIA (T2b, N0, M0) non-small cell lung cancer, adenocarcinoma. He presented with a right lower lobe lung mass. He was diagnosed in March 2025   Biomarker Findings HRD signature - HRDsig Negative Microsatellite status - MS-Stable Tumor Mutational Burden - 0 Muts/Mb Genomic Findings For a complete list of the genes assayed, please refer to the Appendix. CDKN2A loss KRAS G12V STK11 loss CDKN2B loss 7 Disease relevant genes with no reportable alterations: ALK, BRAF, EGFR, ERBB2, MET, RET, ROS1   PDL1: 0%   PRIOR THERAPY: 1) s/p Right lower lobectomy for  under the care of Dr. Shyrl which was completed on 10/25/23    CURRENT THERAPY: Adjuvant chemotherapy with cisplatin  75 mg/m2 and alimta  500 mg/m2 IV every 3 weeks. First dose expected 12/14/23.  Status post 1 cycle.  INTERVAL HISTORY: [name] 73 y.o. male returns to the clinic today for follow-up visit. Discussed the use of AI scribe software for clinical note transcription with the patient, who gave verbal consent to proceed.  History of Present Illness [name] is a 73 year old male who presents for evaluation before starting cycle number two of chemotherapy. He is accompanied by his wife.  He has experienced significant fatigue following the first cycle of chemotherapy, describing it as persistent throughout the three weeks since treatment. The fatigue was most pronounced in the first couple of days post-treatment, during which he was mostly inactive. He continues to feel tired, often needing to sleep after being awake for a few hours.  He has not experienced nausea during the first cycle of chemotherapy and has gained weight since starting treatment.  He describes difficulty breathing, particularly when exerting himself, such as when  helping his daughter move two weeks ago. He attributes some of this difficulty to the hot and humid weather, which has been exacerbated by his ongoing move.  No nausea or hair loss. Reports fatigue and difficulty breathing, particularly in hot and humid conditions.    MEDICAL HISTORY: Past Medical History:  Diagnosis Date   Aortic root dilatation (HCC)    Arthritis    Coronary atherosclerosis of native coronary artery    Multiple stents - RCA/LAD/diagonal, overlapping DES LAD, stent thrombosis 2005, ultimately CABG   Essential hypertension    H/O hiatal hernia    History of GI bleed    On nonsteroidals   Hyperlipemia    Myocardial infarction [facility])    Anterior with LAD stent thrombosis 2005, 2004 and 2005    Obesity    Paroxysmal atrial flutter (HCC)    Sleep apnea    CPAP daily    ALLERGIES:  is allergic to bee venom, other, and chlorhexidine .  MEDICATIONS:  Current Outpatient Medications  Medication Sig Dispense Refill   acetaminophen  (TYLENOL ) 500 MG tablet Take 1-2 tablets (500-1,000 mg total) by mouth every 6 (six) hours as needed.     aspirin  81 MG EC tablet Take 81 mg by mouth at bedtime.      atorvastatin  (LIPITOR) 20 MG tablet Take 1 tablet (20 mg total) by mouth every evening.     b complex vitamins tablet 1 tablet daily. B12-B6-Folic Acid  (Sublingual)     Cholecalciferol  (VITAMIN D ) 125 MCG (5000 UT) CAPS Take 5,000 Units by mouth daily.      Coenzyme Q10 (CO Q-10) 100  MG CAPS Take 100 mg by mouth daily.      cyclobenzaprine  (FLEXERIL ) 10 MG tablet Take 1 tablet (10 mg total) by mouth 3 (three) times daily as needed for muscle spasms. 30 tablet 0   dexamethasone  (DECADRON ) 4 MG tablet Take 1 tablet twice a day the day before, the day of, and the day after chemotherapy 40 tablet 2   EPINEPHrine  0.3 mg/0.3 mL IJ SOAJ injection Inject 0.3 mLs into the skin as needed for anaphylaxis.      folic acid  (FOLVITE ) 1 MG tablet Take 1 tablet (1 mg total) by mouth daily. 30  tablet 2   gabapentin  (NEURONTIN ) 300 MG capsule Take 1 capsule (300 mg total) by mouth at bedtime for 2 days, THEN 1 capsule (300 mg total) 2 (two) times daily. 62 capsule 0   Krill Oil 1000 MG CAPS Take 1,000 mg by mouth daily.     loratadine  (CLARITIN ) 10 MG tablet Take 10 mg by mouth daily as needed for allergies.     losartan  (COZAAR ) 100 MG tablet Take 100 mg by mouth daily.     LYCOPENE PO Take 1 tablet by mouth daily.     magnesium  oxide (MAG-OX) 400 (240 Mg) MG tablet Take 1 tablet (400 mg total) by mouth daily. 30 tablet 0   metoprolol  (LOPRESSOR ) 50 MG tablet Take 50 mg by mouth 2 (two) times daily.     MOUNJARO 15 MG/0.5ML Pen Inject 15 mg into the skin once a week. Mondays     Multiple Vitamins-Minerals (EYE VITAMINS PO) Take 1 capsule by mouth daily. [employer] [facility]     naproxen  (NAPROSYN ) 500 MG tablet Take 500 mg by mouth 2 (two) times daily with a meal.     nitroGLYCERIN  (NITROSTAT ) 0.4 MG SL tablet Place 0.4 mg under the tongue every 5 (five) minutes as needed for chest pain. Up to 3x     OLIVE LEAF EXTRACT PO Take 1 capsule by mouth daily. 20,000 mg     ondansetron  (ZOFRAN ) 8 MG tablet Take 1 tablet (8 mg total) by mouth every 8 (eight) hours as needed for nausea or vomiting. Starting day 3 after chemo 30 tablet 2   Pomegranate, Pun[place] granatum, (POMEGRANATE PO) Take 5,000 mg by mouth daily.     prochlorperazine  (COMPAZINE ) 10 MG tablet Take 1 tablet (10 mg total) by mouth every 6 (six) hours as needed. 30 tablet 2   Tadalafil  2.5 MG TABS Take 2.5 mg by mouth every morning.     Turmeric 500 MG TABS Take 500 mg by mouth 2 (two) times daily.      No current facility-administered medications for this visit.    SURGICAL HISTORY:  Past Surgical History:  Procedure Laterality Date   APPENDECTOMY     BREATH TEK H PYLORI  06/11/2012   Procedure: BREATH TEK H PYLORI;  Surgeon: Alm VEAR Angle, [name];  Location: [facility];  Service: General;  Laterality: N/AMERL Odor   BRONCHIAL  BIOPSY  09/11/2023   Procedure: BRONCHOSCOPY, WITH BIOPSY;  Surgeon: Shelah Lamar RAMAN, [name];  Location: [facility];  Service: Pulmonary;;   BRONCHIAL BRUSHINGS  09/11/2023   Procedure: BRONCHOSCOPY, WITH BRUSH BIOPSY;  Surgeon: Shelah Lamar RAMAN, [name];  Location: [facility];  Service: Pulmonary;;   BRONCHIAL NEEDLE ASPIRATION BIOPSY  09/11/2023   Procedure: BRONCHOSCOPY, WITH NEEDLE ASPIRATION BIOPSY;  Surgeon: Shelah Lamar RAMAN, [name];  Location: [facility];  Service: Pulmonary;;   CARDIAC SURGERY     CARPAL TUNNEL RELEASE Right  12/02/2010   CARPAL TUNNEL RELEASE  06/06/2011   Procedure: CARPAL TUNNEL RELEASE;  Surgeon: Lamar LULLA Leonor Mickey., [name];  Location: [facility];  Service: Orthopedics;  Laterality: Right;   CARPAL TUNNEL RELEASE Right 04/10/2013   Procedure: REVISION OF CARPAL TUNNEL RELEASE LIGAMENT RIGHT WRIST;  Surgeon: Lamar LULLA Leonor Mickey., [name];  Location: [facility];  Service: Orthopedics;  Laterality: Right;   CERVICAL FUSION  03/03/2009   CHOLECYSTECTOMY     CORONARY ANGIOPLASTY WITH STENT PLACEMENT  [phone]   CORONARY ARTERY BYPASS GRAFT N/A 12/30/2013   Procedure: CORONARY ARTERY BYPASS GRAFTING (CABG) x three, using left internal mammary artery and right leg greater sapheneous vein harvested endoscopically - LIMA to LAD, SVG-D, SVG-OM1;  Surgeon: Maude Fleeta Ochoa, [name];  [facility];  Service: Open Heart Surgery;  Laterality: N/A;   ELBOW ARTHROSCOPY Right    FUDUCIAL PLACEMENT  09/11/2023   Procedure: INSERTION, FIDUCIAL MARKER, GOLD;  Surgeon: Shelah Lamar RAMAN, [name];  Location: [facility];  Service: Pulmonary;;   INTERCOSTAL NERVE BLOCK Right 10/25/2023   Procedure: BLOCK, NERVE, INTERCOSTAL;  Surgeon: Shyrl Linnie KIDD, [name];  Location: [facility];  Service: Thoracic;  Laterality: Right;   IRRIGATION AND DEBRIDEMENT SHOULDER Right 04/30/2020   Procedure: Right shoulder poly exchange and possible glensphere exchange and left shoulder injection;  Surgeon: Kay Kemps, [name];  Location: [facility];  Service: Orthopedics;  Laterality: Right;   LAPAROSCOPIC GASTRIC BANDING  08/31/2012   left carpal tunnel release      LEFT HEART CATHETERIZATION WITH CORONARY ANGIOGRAM N/A 12/26/2013   Procedure: LEFT HEART CATHETERIZATION WITH CORONARY ANGIOGRAM;  Surgeon: Alm LELON Clay, [name];  Location: [facility];  Service: Cardiovascular;  Laterality: N/A;   LOBECTOMY, LUNG, ROBOT-ASSISTED, USING VATS Right 10/25/2023   Procedure: LOBECTOMY, RIGHT LOWER LUNG, ROBOT-ASSISTED, USING VATS;  Surgeon: Shyrl Linnie KIDD, [name];  Location: [facility];  Service: Thoracic;  Laterality: Right;  RIGHT ROBOTIC ASSISTED THORACOSCOPY FOR RIGHT LOWER LOBECTOMY   LYMPH NODE BIOPSY Right 10/25/2023   Procedure: LYMPH NODE BIOPSY;  Surgeon: Shyrl Linnie KIDD, [name];  Location: [facility];  Service: Thoracic;  Laterality: Right;   REVERSE SHOULDER ARTHROPLASTY Right 01/30/2020   Procedure: REVERSE SHOULDER ARTHROPLASTY;  Surgeon: Kay Kemps, [name];  Location: [facility];  Service: Orthopedics;  Laterality: Right;  interscalene block   REVERSE SHOULDER ARTHROPLASTY Left 03/30/2023   Procedure: REVERSE SHOULDER ARTHROPLASTY;  Surgeon: Kay Kemps, [name];  Location: [facility];  Service: Orthopedics;  Laterality: Left;   SHOULDER ARTHROSCOPY Right 10/01/2009   TOTAL KNEE ARTHROPLASTY Right 07/03/2004   TOTAL KNEE ARTHROPLASTY Left 07/03/2005   ULNAR NERVE TRANSPOSITION  06/06/2011   Procedure: ULNAR NERVE DECOMPRESSION/TRANSPOSITION;  Surgeon: Lamar LULLA Leonor Mickey., [name];  Location: [facility];  Service: Orthopedics;  Laterality: Right;  decompression ulnar nerve right cubital tunnel     REVIEW OF SYSTEMS:  Constitutional: positive for fatigue Eyes: negative Ears, nose, mouth, throat, and face: negative Respiratory: negative Cardiovascular: negative Gastrointestinal: negative Genitourinary:negative Integument/breast: negative Hematologic/lymphatic: negative Musculoskeletal:negative Neurological:  negative Behavioral/Psych: negative Endocrine: negative Allergic/Immunologic: negative   PHYSICAL EXAMINATION: General appearance: alert, cooperative, fatigued, and no distress Head: Normocephalic, without obvious abnormality, atraumatic Neck: no adenopathy, no JVD, supple, symmetrical, trachea midline, and thyroid  not enlarged, symmetric, no tenderness/mass/nodules Lymph nodes: Cervical, supraclavicular, and axillary nodes normal. Resp: clear to auscultation bilaterally Back: symmetric, no curvature. ROM normal. No CVA tenderness. Cardio: regular rate and rhythm, S1, S2 normal, no murmur, click, rub or gallop  GI: soft, non-tender; bowel sounds normal; no masses,  no organomegaly Extremities: extremities normal, atraumatic, no cyanosis or edema Neurologic: Alert and oriented X 3, normal strength and tone. Normal symmetric reflexes. Normal coordination and gait  ECOG PERFORMANCE STATUS: 1 - Symptomatic but completely ambulatory  Blood pressure 137/79, temperature 98.2 F (36.8 C), temperature source Temporal, resp. rate 19, height 5' 6 (1.676 m), weight 243 lb 1.6 oz (110.3 kg), SpO2 96%.  LABORATORY DATA: Lab Results  Component Value Date   WBC 5.4 12/26/2023   HGB 13.8 12/26/2023   HCT 40.7 12/26/2023   MCV 91.9 12/26/2023   PLT 226 12/26/2023      Chemistry      Component Value Date/Time   NA 134 (L) 12/26/2023 0908   K 4.5 12/26/2023 0908   CL 101 12/26/2023 0908   CO2 21 (L) 12/26/2023 0908   BUN 28 (H) 12/26/2023 0908   CREATININE 0.72 12/26/2023 0908   CREATININE 0.77 12/19/2023 1455      Component Value Date/Time   CALCIUM  9.1 12/26/2023 0908   ALKPHOS 44 12/26/2023 0908   AST 18 12/26/2023 0908   AST 13 (L) 12/19/2023 1455   ALT 19 12/26/2023 0908   ALT 16 12/19/2023 1455   BILITOT 0.6 12/26/2023 0908   BILITOT 0.5 12/19/2023 1455       RADIOGRAPHIC STUDIES: DG Chest 2 View Result Date: 12/18/2023 CLINICAL DATA:  Postop right lower lobectomy  10/25/2023. EXAM: CHEST - 2 VIEW COMPARISON:  Radiographs 11/09/2023 and 10/26/2023.  CT 09/05/2023. FINDINGS: The heart size and mediastinal contours are stable status post median sternotomy and CABG. Stable volume loss in the right hemithorax with associated right costophrenic angle blunting. No evidence of pneumothorax or enlarging pleural effusion. The lungs appear clear. Additional postsurgical changes are stable, including a gastric laparoscopic band and previous reverse shoulder arthroplasty bilaterally. IMPRESSION: Stable postsurgical changes in the right hemithorax. No evidence of pneumothorax or other acute cardiopulmonary process. Electronically Signed   By: Elsie Perone M.D.   On: 12/18/2023 15:20    ASSESSMENT AND PLAN: This is a very pleasant 73 years old white male with Stage IIA (T2b, N0, M0) non-small cell lung cancer, adenocarcinoma. He presented with a right lower lobe lung mass. He was diagnosed in March 2025.  He is status post right lower lobectomy with lymph node sampling under the care of Dr. Shyrl.  The patient is currently undergoing adjuvant systemic chemotherapy with cisplatin  75 Mg/M2 and Alimta  500 Mg/M2 every 3 weeks.  He is status post 1 cycle. He has been tolerating his treatment well except for the fatigue that persisted for the last few weeks. Assessment and Plan Assessment & Plan Breathing difficulty Reports difficulty breathing, particularly exacerbated by physical activity such as moving. This may be related to underlying lung issues, possibly exacerbated by the current treatment and environmental factors such as heat and humidity. - Proceed with today's chemotherapy treatment as planned.  Fatigue Persistent fatigue throughout the three-week cycle following the first round of treatment, most pronounced in the first few days post-treatment with some improvement over time. Likely related to the ongoing chemotherapy regimen. - Continue with the current  chemotherapy regimen as planned. He was advised to call immediately if he has any concerning symptoms in the interval. The patient voices understanding of current disease status and treatment options and is in agreement with the current care plan.  All questions were answered. The patient knows to call the clinic with any problems, questions or  concerns. We can certainly see the patient much sooner if necessary. The total time spent in the appointment was 30 minutes including review of chart and various tests results, discussions about plan of care and coordination of care plan .   Disclaimer: This note was dictated with voice recognition software. Similar sounding words can inadvertently be transcribed and may not be corrected upon review.

## 2024-01-03 NOTE — Patient Instructions (Signed)
 [facility] - A DEPT OF [facility]  Discharge Instructions: Thank you for choosing [facility] to provide your oncology and hematology care.   If you have a lab appointment with the Cancer Center, please go directly to the Cancer Center and check in at the registration area.   Wear comfortable clothing and clothing appropriate for easy access to any Portacath or PICC line.   We strive to give you quality time with your provider. You may need to reschedule your appointment if you arrive late (15 or more minutes).  Arriving late affects you and other patients whose appointments are after yours.  Also, if you miss three or more appointments without notifying the office, you may be dismissed from the clinic at the provider's discretion.      For prescription refill requests, have your pharmacy contact our office and allow 72 hours for refills to be completed.    Today you received the following chemotherapy and/or immunotherapy agents: pemetrexed and cisplatin      To help prevent nausea and vomiting after your treatment, we encourage you to take your nausea medication as directed.  BELOW ARE SYMPTOMS THAT SHOULD BE REPORTED IMMEDIATELY: *FEVER GREATER THAN 100.4 F (38 C) OR HIGHER *CHILLS OR SWEATING *NAUSEA AND VOMITING THAT IS NOT CONTROLLED WITH YOUR NAUSEA MEDICATION *UNUSUAL SHORTNESS OF BREATH *UNUSUAL BRUISING OR BLEEDING *URINARY PROBLEMS (pain or burning when urinating, or frequent urination) *BOWEL PROBLEMS (unusual diarrhea, constipation, pain near the anus) TENDERNESS IN MOUTH AND THROAT WITH OR WITHOUT PRESENCE OF ULCERS (sore throat, sores in mouth, or a toothache) UNUSUAL RASH, SWELLING OR PAIN  UNUSUAL VAGINAL DISCHARGE OR ITCHING   Items with * indicate a potential emergency and should be followed up as soon as possible or go to the Emergency Department if any problems should occur.  Please show the CHEMOTHERAPY ALERT CARD or  IMMUNOTHERAPY ALERT CARD at check-in to the Emergency Department and triage nurse.  Should you have questions after your visit or need to cancel or reschedule your appointment, please contact [facility] - A DEPT OF [facility]  Dept: [phone]  and follow the prompts.  Office hours are 8:00 a.m. to 4:30 p.m. Monday - Friday. Please note that voicemails left after 4:00 p.m. may not be returned until the following business day.  We are closed weekends and major holidays. You have access to a nurse at all times for urgent questions. Please call the main number to the clinic Dept: [phone] and follow the prompts.   For any non-urgent questions, you may also contact your provider using MyChart. We now offer e-Visits for anyone 41 and older to request care online for non-urgent symptoms. For details visit [email or web address].   Also download the MyChart app! Go to the app store, search MyChart, open the app, select [facility], and log in with your MyChart username and password.

## 2024-01-09 ENCOUNTER — Other Ambulatory Visit: Payer: Self-pay

## 2024-01-09 DIAGNOSIS — C3431 Malignant neoplasm of lower lobe, right bronchus or lung: Secondary | ICD-10-CM

## 2024-01-10 ENCOUNTER — Inpatient Hospital Stay

## 2024-01-10 DIAGNOSIS — C3431 Malignant neoplasm of lower lobe, right bronchus or lung: Secondary | ICD-10-CM

## 2024-01-10 DIAGNOSIS — Z79630 Long term (current) use of alkylating agent: Secondary | ICD-10-CM | POA: Diagnosis not present

## 2024-01-10 DIAGNOSIS — Z79631 Long term (current) use of antimetabolite agent: Secondary | ICD-10-CM | POA: Diagnosis not present

## 2024-01-10 DIAGNOSIS — Z5111 Encounter for antineoplastic chemotherapy: Secondary | ICD-10-CM | POA: Diagnosis not present

## 2024-01-10 LAB — CBC WITH DIFFERENTIAL/PLATELET
Abs Immature Granulocytes: 0.03 K/uL (ref 0.00–0.07)
Basophils Absolute: 0 K/uL (ref 0.0–0.1)
Basophils Relative: 1 %
Eosinophils Absolute: 0.1 K/uL (ref 0.0–0.5)
Eosinophils Relative: 2 %
HCT: 43 % (ref 39.0–52.0)
Hemoglobin: 14.5 g/dL (ref 13.0–17.0)
Immature Granulocytes: 1 %
Lymphocytes Relative: 34 %
Lymphs Abs: 1.8 K/uL (ref 0.7–4.0)
MCH: 30.3 pg (ref 26.0–34.0)
MCHC: 33.7 g/dL (ref 30.0–36.0)
MCV: 89.8 fL (ref 80.0–100.0)
Monocytes Absolute: 1.2 K/uL — ABNORMAL HIGH (ref 0.1–1.0)
Monocytes Relative: 22 %
Neutro Abs: 2.2 K/uL (ref 1.7–7.7)
Neutrophils Relative %: 40 %
Platelets: 249 K/uL (ref 150–400)
RBC: 4.79 MIL/uL (ref 4.22–5.81)
RDW: 13.1 % (ref 11.5–15.5)
WBC: 5.4 K/uL (ref 4.0–10.5)
nRBC: 0 % (ref 0.0–0.2)

## 2024-01-10 LAB — COMPREHENSIVE METABOLIC PANEL WITH GFR
ALT: 18 U/L (ref 0–44)
AST: 17 U/L (ref 15–41)
Albumin: 3.9 g/dL (ref 3.5–5.0)
Alkaline Phosphatase: 51 U/L (ref 38–126)
Anion gap: 12 (ref 5–15)
BUN: 18 mg/dL (ref 8–23)
CO2: 21 mmol/L — ABNORMAL LOW (ref 22–32)
Calcium: 9.1 mg/dL (ref 8.9–10.3)
Chloride: 98 mmol/L (ref 98–111)
Creatinine, Ser: 0.72 mg/dL (ref 0.61–1.24)
GFR, Estimated: >60 mL/min (ref >60)
Glucose, Bld: 91 mg/dL (ref 70–99)
Potassium: 4.2 mmol/L (ref 3.5–5.1)
Sodium: 131 mmol/L — ABNORMAL LOW (ref 135–145)
Total Bilirubin: 0.5 mg/dL (ref 0.0–1.2)
Total Protein: 7.1 g/dL (ref 6.5–8.1)

## 2024-01-10 LAB — MAGNESIUM: Magnesium: 1.8 mg/dL (ref 1.7–2.4)

## 2024-01-16 ENCOUNTER — Other Ambulatory Visit: Payer: Self-pay

## 2024-01-16 DIAGNOSIS — C3431 Malignant neoplasm of lower lobe, right bronchus or lung: Secondary | ICD-10-CM

## 2024-01-17 ENCOUNTER — Inpatient Hospital Stay

## 2024-01-17 DIAGNOSIS — Z79630 Long term (current) use of alkylating agent: Secondary | ICD-10-CM | POA: Diagnosis not present

## 2024-01-17 DIAGNOSIS — C3431 Malignant neoplasm of lower lobe, right bronchus or lung: Secondary | ICD-10-CM | POA: Diagnosis not present

## 2024-01-17 DIAGNOSIS — Z79631 Long term (current) use of antimetabolite agent: Secondary | ICD-10-CM | POA: Diagnosis not present

## 2024-01-17 DIAGNOSIS — Z5111 Encounter for antineoplastic chemotherapy: Secondary | ICD-10-CM | POA: Diagnosis not present

## 2024-01-17 LAB — CBC WITH DIFFERENTIAL/PLATELET
Abs Immature Granulocytes: 0.02 K/uL (ref 0.00–0.07)
Basophils Absolute: 0 K/uL (ref 0.0–0.1)
Basophils Relative: 1 %
Eosinophils Absolute: 0.1 K/uL (ref 0.0–0.5)
Eosinophils Relative: 2 %
HCT: 38.5 % — ABNORMAL LOW (ref 39.0–52.0)
Hemoglobin: 12.9 g/dL — ABNORMAL LOW (ref 13.0–17.0)
Immature Granulocytes: 1 %
Lymphocytes Relative: 29 %
Lymphs Abs: 1.3 K/uL (ref 0.7–4.0)
MCH: 30.9 pg (ref 26.0–34.0)
MCHC: 33.5 g/dL (ref 30.0–36.0)
MCV: 92.1 fL (ref 80.0–100.0)
Monocytes Absolute: 0.9 K/uL (ref 0.1–1.0)
Monocytes Relative: 19 %
Neutro Abs: 2.2 K/uL (ref 1.7–7.7)
Neutrophils Relative %: 48 %
Platelets: 169 K/uL (ref 150–400)
RBC: 4.18 MIL/uL — ABNORMAL LOW (ref 4.22–5.81)
RDW: 14 % (ref 11.5–15.5)
WBC: 4.4 K/uL (ref 4.0–10.5)
nRBC: 0 % (ref 0.0–0.2)

## 2024-01-17 LAB — COMPREHENSIVE METABOLIC PANEL WITH GFR
ALT: 15 U/L (ref 0–44)
AST: 18 U/L (ref 15–41)
Albumin: 3.7 g/dL (ref 3.5–5.0)
Alkaline Phosphatase: 51 U/L (ref 38–126)
Anion gap: 14 (ref 5–15)
BUN: 16 mg/dL (ref 8–23)
CO2: 21 mmol/L — ABNORMAL LOW (ref 22–32)
Calcium: 8.9 mg/dL (ref 8.9–10.3)
Chloride: 103 mmol/L (ref 98–111)
Creatinine, Ser: 0.72 mg/dL (ref 0.61–1.24)
GFR, Estimated: >60 mL/min (ref >60)
Glucose, Bld: 93 mg/dL (ref 70–99)
Potassium: 4 mmol/L (ref 3.5–5.1)
Sodium: 138 mmol/L (ref 135–145)
Total Bilirubin: 0.7 mg/dL (ref 0.0–1.2)
Total Protein: 6.8 g/dL (ref 6.5–8.1)

## 2024-01-17 LAB — MAGNESIUM: Magnesium: 1.8 mg/dL (ref 1.7–2.4)

## 2024-01-23 MED FILL — Fosaprepitant Dimeglumine For IV Infusion 150 MG (Base Eq): INTRAVENOUS | Qty: 5 | Status: AC

## 2024-01-24 ENCOUNTER — Inpatient Hospital Stay

## 2024-01-24 ENCOUNTER — Inpatient Hospital Stay: Admitting: Internal Medicine

## 2024-01-24 VITALS — BP 142/81 | HR 60

## 2024-01-24 VITALS — BP 163/92 | HR 73 | Temp 98.3°F | Resp 18 | Ht 66.0 in | Wt 249.3 lb

## 2024-01-24 DIAGNOSIS — C3400 Malignant neoplasm of unspecified main bronchus: Secondary | ICD-10-CM

## 2024-01-24 DIAGNOSIS — Z5111 Encounter for antineoplastic chemotherapy: Secondary | ICD-10-CM | POA: Diagnosis not present

## 2024-01-24 DIAGNOSIS — Z79630 Long term (current) use of alkylating agent: Secondary | ICD-10-CM | POA: Diagnosis not present

## 2024-01-24 DIAGNOSIS — Z79631 Long term (current) use of antimetabolite agent: Secondary | ICD-10-CM | POA: Diagnosis not present

## 2024-01-24 DIAGNOSIS — C3431 Malignant neoplasm of lower lobe, right bronchus or lung: Secondary | ICD-10-CM

## 2024-01-24 LAB — CBC WITH DIFFERENTIAL (CANCER CENTER ONLY)
Abs Immature Granulocytes: 0.03 K/uL (ref 0.00–0.07)
Basophils Absolute: 0 K/uL (ref 0.0–0.1)
Basophils Relative: 0 %
Eosinophils Absolute: 0 K/uL (ref 0.0–0.5)
Eosinophils Relative: 0 %
HCT: 37.6 % — ABNORMAL LOW (ref 39.0–52.0)
Hemoglobin: 12.8 g/dL — ABNORMAL LOW (ref 13.0–17.0)
Immature Granulocytes: 1 %
Lymphocytes Relative: 12 %
Lymphs Abs: 0.8 K/uL (ref 0.7–4.0)
MCH: 30.5 pg (ref 26.0–34.0)
MCHC: 34 g/dL (ref 30.0–36.0)
MCV: 89.7 fL (ref 80.0–100.0)
Monocytes Absolute: 0.7 K/uL (ref 0.1–1.0)
Monocytes Relative: 10 %
Neutro Abs: 5.1 K/uL (ref 1.7–7.7)
Neutrophils Relative %: 77 %
Platelet Count: 220 K/uL (ref 150–400)
RBC: 4.19 MIL/uL — ABNORMAL LOW (ref 4.22–5.81)
RDW: 14.6 % (ref 11.5–15.5)
WBC Count: 6.6 K/uL (ref 4.0–10.5)
nRBC: 0 % (ref 0.0–0.2)

## 2024-01-24 LAB — CMP (CANCER CENTER ONLY)
ALT: 11 U/L (ref 0–44)
AST: 13 U/L — ABNORMAL LOW (ref 15–41)
Albumin: 4.1 g/dL (ref 3.5–5.0)
Alkaline Phosphatase: 44 U/L (ref 38–126)
Anion gap: 9 (ref 5–15)
BUN: 22 mg/dL (ref 8–23)
CO2: 23 mmol/L (ref 22–32)
Calcium: 9 mg/dL (ref 8.9–10.3)
Chloride: 103 mmol/L (ref 98–111)
Creatinine: 0.71 mg/dL (ref 0.61–1.24)
GFR, Estimated: >60 mL/min (ref >60)
Glucose, Bld: 154 mg/dL — ABNORMAL HIGH (ref 70–99)
Potassium: 4.4 mmol/L (ref 3.5–5.1)
Sodium: 135 mmol/L (ref 135–145)
Total Bilirubin: 0.4 mg/dL (ref 0.0–1.2)
Total Protein: 6.9 g/dL (ref 6.5–8.1)

## 2024-01-24 LAB — MAGNESIUM: Magnesium: 1.8 mg/dL (ref 1.7–2.4)

## 2024-01-24 MED ORDER — SODIUM CHLORIDE 0.9 % IV SOLN
150.0000 mg | Freq: Once | INTRAVENOUS | Status: AC
  Administered 2024-01-24: 150 mg via INTRAVENOUS
  Filled 2024-01-24: qty 150

## 2024-01-24 MED ORDER — SODIUM CHLORIDE 0.9% FLUSH: 10.0000 mL | INTRAVENOUS | Status: DC | PRN

## 2024-01-24 MED ORDER — SODIUM CHLORIDE 0.9 % IV SOLN: INTRAVENOUS | Status: DC

## 2024-01-24 MED ORDER — SODIUM CHLORIDE 0.9 % IV SOLN
500.0000 mg/m2 | Freq: Once | INTRAVENOUS | Status: AC
  Administered 2024-01-24: 1100 mg via INTRAVENOUS
  Filled 2024-01-24: qty 40

## 2024-01-24 MED ORDER — CYANOCOBALAMIN 1000 MCG/ML IJ SOLN
1000.0000 ug | Freq: Once | INTRAMUSCULAR | Status: AC
  Administered 2024-01-24: 1000 ug via INTRAMUSCULAR
  Filled 2024-01-24: qty 1

## 2024-01-24 MED ORDER — PALONOSETRON HCL INJECTION 0.25 MG/5ML
0.2500 mg | Freq: Once | INTRAVENOUS | Status: AC
  Administered 2024-01-24: 0.25 mg via INTRAVENOUS
  Filled 2024-01-24: qty 5

## 2024-01-24 MED ORDER — DEXAMETHASONE SODIUM PHOSPHATE 10 MG/ML IJ SOLN
10.0000 mg | Freq: Once | INTRAMUSCULAR | Status: AC
  Administered 2024-01-24: 10 mg via INTRAVENOUS
  Filled 2024-01-24: qty 1

## 2024-01-24 MED ORDER — MAGNESIUM SULFATE 2 GM/50ML IV SOLN
2.0000 g | Freq: Once | INTRAVENOUS | Status: AC
  Administered 2024-01-24: 2 g via INTRAVENOUS
  Filled 2024-01-24: qty 50

## 2024-01-24 MED ORDER — SODIUM CHLORIDE 0.9 % IV SOLN
75.0000 mg/m2 | Freq: Once | INTRAVENOUS | Status: AC
  Administered 2024-01-24: 168 mg via INTRAVENOUS
  Filled 2024-01-24: qty 168

## 2024-01-24 MED ORDER — POTASSIUM CHLORIDE IN NACL 20-0.9 MEQ/L-% IV SOLN
Freq: Once | INTRAVENOUS | Status: AC
  Filled 2024-01-24: qty 1000

## 2024-01-24 NOTE — Progress Notes (Signed)
 [facility] [phone]   [phone]  OFFICE PROGRESS NOTE  Lari Elspeth BRAVO, MD [address] [place] [address]  DIAGNOSIS: Stage IIA (T2b, N0, M0) non-small cell lung cancer, adenocarcinoma. He presented with a right lower lobe lung mass. He was diagnosed in March 2025   Biomarker Findings HRD signature - HRDsig Negative Microsatellite status - MS-Stable Tumor Mutational Burden - 0 Muts/Mb Genomic Findings For a complete list of the genes assayed, please refer to the Appendix. CDKN2A loss KRAS G12V STK11 loss CDKN2B loss 7 Disease relevant genes with no reportable alterations: ALK, BRAF, EGFR, ERBB2, MET, RET, ROS1   PDL1: 0%   PRIOR THERAPY: 1) s/p Right lower lobectomy for  under the care of Dr. Shyrl which was completed on 10/25/23    CURRENT THERAPY: Adjuvant chemotherapy with cisplatin  75 mg/m2 and alimta  500 mg/m2 IV every 3 weeks. First dose expected 12/14/23.  Status post 2 cycles.  INTERVAL HISTORY: [name] 73 y.o. male returns to the clinic today for follow-up visit.Discussed the use of AI scribe software for clinical note transcription with the patient, who gave verbal consent to proceed.  History of Present Illness [name] is a 73 year old male with stage 2A non-small cell lung cancer, adenocarcinoma, who presents for evaluation before starting cycle number three of chemotherapy.  He was diagnosed with stage 2A non-small cell lung cancer, adenocarcinoma, in March 2025. He underwent a right lower lobectomy and is currently receiving adjuvant systemic chemotherapy with cisplatin  and Alimta . He has completed two cycles of chemotherapy and is here for evaluation before starting the third cycle.  He experiences persistent fatigue that has not improved since the last cycle of chemotherapy. He also has significant shortness of breath, particularly when walking through the house, getting dressed, or going outside  in high temperatures and humidity. No chest pain, palpitations, or dizziness.  He has been consuming beef and chicken livers in an effort to manage mild anemia. He inquires about resuming Majorna after completing chemotherapy and is concerned about the duration he needs to sleep separately from his wife and use a different restroom post-treatment.     MEDICAL HISTORY: Past Medical History:  Diagnosis Date   Aortic root dilatation (HCC)    Arthritis    Coronary atherosclerosis of native coronary artery    Multiple stents - RCA/LAD/diagonal, overlapping DES LAD, stent thrombosis 2005, ultimately CABG   Essential hypertension    H/O hiatal hernia    History of GI bleed    On nonsteroidals   Hyperlipemia    Myocardial infarction [facility]    Anterior with LAD stent thrombosis 2005, 2004 and 2005    Obesity    Paroxysmal atrial flutter (HCC)    Sleep apnea    CPAP daily    ALLERGIES:  is allergic to bee venom, other, and chlorhexidine .  MEDICATIONS:  Current Outpatient Medications  Medication Sig Dispense Refill   acetaminophen  (TYLENOL ) 500 MG tablet Take 1-2 tablets (500-1,000 mg total) by mouth every 6 (six) hours as needed.     aspirin  81 MG EC tablet Take 81 mg by mouth at bedtime.      atorvastatin  (LIPITOR) 20 MG tablet Take 1 tablet (20 mg total) by mouth every evening.     b complex vitamins tablet 1 tablet daily. B12-B6-Folic Acid  (Sublingual)     Cholecalciferol  (VITAMIN D ) 125 MCG (5000 UT) CAPS Take 5,000 Units by mouth daily.  Coenzyme Q10 (CO Q-10) 100 MG CAPS Take 100 mg by mouth daily.      cyclobenzaprine  (FLEXERIL ) 10 MG tablet Take 1 tablet (10 mg total) by mouth 3 (three) times daily as needed for muscle spasms. 30 tablet 0   dexamethasone  (DECADRON ) 4 MG tablet Take 1 tablet twice a day the day before, the day of, and the day after chemotherapy 40 tablet 2   EPINEPHrine  0.3 mg/0.3 mL IJ SOAJ injection Inject 0.3 mLs into the skin as needed for anaphylaxis.       folic acid  (FOLVITE ) 1 MG tablet Take 1 tablet (1 mg total) by mouth daily. 30 tablet 2   gabapentin  (NEURONTIN ) 300 MG capsule Take 1 capsule (300 mg total) by mouth at bedtime for 2 days, THEN 1 capsule (300 mg total) 2 (two) times daily. 62 capsule 0   Krill Oil 1000 MG CAPS Take 1,000 mg by mouth daily.     loratadine  (CLARITIN ) 10 MG tablet Take 10 mg by mouth daily as needed for allergies.     losartan  (COZAAR ) 100 MG tablet Take 100 mg by mouth daily.     LYCOPENE PO Take 1 tablet by mouth daily.     magnesium  oxide (MAG-OX) 400 (240 Mg) MG tablet Take 1 tablet (400 mg total) by mouth daily. 30 tablet 0   metoprolol  (LOPRESSOR ) 50 MG tablet Take 50 mg by mouth 2 (two) times daily.     MOUNJARO 15 MG/0.5ML Pen Inject 15 mg into the skin once a week. Mondays     Multiple Vitamins-Minerals (EYE VITAMINS PO) Take 1 capsule by mouth daily. [facility]     naproxen  (NAPROSYN ) 500 MG tablet Take 500 mg by mouth 2 (two) times daily with a meal.     nitroGLYCERIN  (NITROSTAT ) 0.4 MG SL tablet Place 0.4 mg under the tongue every 5 (five) minutes as needed for chest pain. Up to 3x     OLIVE LEAF EXTRACT PO Take 1 capsule by mouth daily. 20,000 mg     ondansetron  (ZOFRAN ) 8 MG tablet Take 1 tablet (8 mg total) by mouth every 8 (eight) hours as needed for nausea or vomiting. Starting day 3 after chemo 30 tablet 2   Pomegranate, [place] gran[place], (POMEGRANATE PO) Take 5,000 mg by mouth daily.     prochlorperazine  (COMPAZINE ) 10 MG tablet Take 1 tablet (10 mg total) by mouth every 6 (six) hours as needed. 30 tablet 2   Tadalafil  2.5 MG TABS Take 2.5 mg by mouth every morning.     Turmeric 500 MG TABS Take 500 mg by mouth 2 (two) times daily.      No current facility-administered medications for this visit.    SURGICAL HISTORY:  Past Surgical History:  Procedure Laterality Date   APPENDECTOMY     BREATH TEK H PYLORI  06/11/2012   Procedure: BREATH TEK H PYLORI;  Surgeon: Alm VEAR Angle, MD;   Location: [facility];  Service: General;  Laterality: N/AMERL Odor   BRONCHIAL BIOPSY  09/11/2023   Procedure: BRONCHOSCOPY, WITH BIOPSY;  Surgeon: Shelah Lamar RAMAN, MD;  Location: [facility];  Service: Pulmonary;;   BRONCHIAL BRUSHINGS  09/11/2023   Procedure: BRONCHOSCOPY, WITH BRUSH BIOPSY;  Surgeon: Shelah Lamar RAMAN, MD;  Location: [facility];  Service: Pulmonary;;   BRONCHIAL NEEDLE ASPIRATION BIOPSY  09/11/2023   Procedure: BRONCHOSCOPY, WITH NEEDLE ASPIRATION BIOPSY;  Surgeon: Shelah Lamar RAMAN, MD;  Location: [facility];  Service: Pulmonary;;   CARDIAC SURGERY  CARPAL TUNNEL RELEASE Right 12/02/2010   CARPAL TUNNEL RELEASE  06/06/2011   Procedure: CARPAL TUNNEL RELEASE;  Surgeon: Lamar LULLA Leonor Mickey., MD;  Location: [facility];  Service: Orthopedics;  Laterality: Right;   CARPAL TUNNEL RELEASE Right 04/10/2013   Procedure: REVISION OF CARPAL TUNNEL RELEASE LIGAMENT RIGHT WRIST;  Surgeon: Lamar LULLA Leonor Mickey., MD;  Location: [facility];  Service: Orthopedics;  Laterality: Right;   CERVICAL FUSION  03/03/2009   CHOLECYSTECTOMY     CORONARY ANGIOPLASTY WITH STENT PLACEMENT  [phone]   CORONARY ARTERY BYPASS GRAFT N/A 12/30/2013   Procedure: CORONARY ARTERY BYPASS GRAFTING (CABG) x three, using left internal mammary artery and right leg greater sapheneous vein harvested endoscopically - LIMA to LAD, SVG-D, SVG-OM1;  Surgeon: Maude Fleeta Ochoa, MD;  Location: [facility];  Service: Open Heart Surgery;  Laterality: N/A;   ELBOW ARTHROSCOPY Right    FUDUCIAL PLACEMENT  09/11/2023   Procedure: INSERTION, FIDUCIAL MARKER, GOLD;  Surgeon: Shelah Lamar RAMAN, MD;  Location: [facility];  Service: Pulmonary;;   INTERCOSTAL NERVE BLOCK Right 10/25/2023   Procedure: BLOCK, NERVE, INTERCOSTAL;  Surgeon: Shyrl Linnie KIDD, MD;  Location: [facility];  Service: Thoracic;  Laterality: Right;   IRRIGATION AND DEBRIDEMENT SHOULDER Right 04/30/2020   Procedure: Right shoulder poly  exchange and possible glensphere exchange and left shoulder injection;  Surgeon: Kay Kemps, MD;  Location: [facility];  Service: Orthopedics;  Laterality: Right;   LAPAROSCOPIC GASTRIC BANDING  08/31/2012   left carpal tunnel release      LEFT HEART CATHETERIZATION WITH CORONARY ANGIOGRAM N/A 12/26/2013   Procedure: LEFT HEART CATHETERIZATION WITH CORONARY ANGIOGRAM;  Surgeon: Alm LELON Clay, MD;  Location: [facility];  Service: Cardiovascular;  Laterality: N/A;   LOBECTOMY, LUNG, ROBOT-ASSISTED, USING VATS Right 10/25/2023   Procedure: LOBECTOMY, RIGHT LOWER LUNG, ROBOT-ASSISTED, USING VATS;  Surgeon: Shyrl Linnie KIDD, MD;  Location: [facility];  Service: Thoracic;  Laterality: Right;  RIGHT ROBOTIC ASSISTED THORACOSCOPY FOR RIGHT LOWER LOBECTOMY   LYMPH NODE BIOPSY Right 10/25/2023   Procedure: LYMPH NODE BIOPSY;  Surgeon: Shyrl Linnie KIDD, MD;  Location: [facility];  Service: Thoracic;  Laterality: Right;   REVERSE SHOULDER ARTHROPLASTY Right 01/30/2020   Procedure: REVERSE SHOULDER ARTHROPLASTY;  Surgeon: Kay Kemps, MD;  Location: [facility];  Service: Orthopedics;  Laterality: Right;  interscalene block   REVERSE SHOULDER ARTHROPLASTY Left 03/30/2023   Procedure: REVERSE SHOULDER ARTHROPLASTY;  Surgeon: Kay Kemps, MD;  Location: [facility];  Service: Orthopedics;  Laterality: Left;   SHOULDER ARTHROSCOPY Right 10/01/2009   TOTAL KNEE ARTHROPLASTY Right 07/03/2004   TOTAL KNEE ARTHROPLASTY Left 07/03/2005   ULNAR NERVE TRANSPOSITION  06/06/2011   Procedure: ULNAR NERVE DECOMPRESSION/TRANSPOSITION;  Surgeon: Lamar LULLA Leonor Mickey., MD;  Location: [facility];  Service: Orthopedics;  Laterality: Right;  decompression ulnar nerve right cubital tunnel     REVIEW OF SYSTEMS:  A comprehensive review of systems was negative except for: Constitutional: positive for fatigue Respiratory: positive for dyspnea on exertion   PHYSICAL EXAMINATION: General appearance: alert, cooperative,  fatigued, and no distress Head: Normocephalic, without obvious abnormality, atraumatic Neck: no adenopathy, no JVD, supple, symmetrical, trachea midline, and thyroid  not enlarged, symmetric, no tenderness/mass/nodules Lymph nodes: Cervical, supraclavicular, and axillary nodes normal. Resp: clear to auscultation bilaterally Back: symmetric, no curvature. ROM normal. No CVA tenderness. Cardio: regular rate and rhythm, S1, S2 normal, no murmur, click, rub or gallop GI: soft, non-tender; bowel sounds normal; no masses,  no  organomegaly Extremities: extremities normal, atraumatic, no cyanosis or edema  ECOG PERFORMANCE STATUS: 1 - Symptomatic but completely ambulatory  Blood pressure (!) 163/92, pulse 73, temperature 98.3 F (36.8 C), temperature source Temporal, resp. rate 18, height 5' 6 (1.676 m), weight 249 lb 4.8 oz (113.1 kg), SpO2 100%.  LABORATORY DATA: Lab Results  Component Value Date   WBC 6.6 01/24/2024   HGB 12.8 (L) 01/24/2024   HCT 37.6 (L) 01/24/2024   MCV 89.7 01/24/2024   PLT 220 01/24/2024      Chemistry      Component Value Date/Time   NA 138 01/17/2024 0956   K 4.0 01/17/2024 0956   CL 103 01/17/2024 0956   CO2 21 (L) 01/17/2024 0956   BUN 16 01/17/2024 0956   CREATININE 0.72 01/17/2024 0956   CREATININE 0.67 01/03/2024 0822      Component Value Date/Time   CALCIUM  8.9 01/17/2024 0956   ALKPHOS 51 01/17/2024 0956   AST 18 01/17/2024 0956   AST 15 01/03/2024 0822   ALT 15 01/17/2024 0956   ALT 15 01/03/2024 0822   BILITOT 0.7 01/17/2024 0956   BILITOT 0.4 01/03/2024 0822       RADIOGRAPHIC STUDIES: No results found.   ASSESSMENT AND PLAN: This is a very pleasant 73 years old white male with Stage IIA (T2b, N0, M0) non-small cell lung cancer, adenocarcinoma. He presented with a right lower lobe lung mass. He was diagnosed in March 2025.  He is status post right lower lobectomy with lymph node sampling under the care of Dr. Shyrl.  The patient is  currently undergoing adjuvant systemic chemotherapy with cisplatin  75 Mg/M2 and Alimta  500 Mg/M2 every 3 weeks.  He is status post 2 cycles. He is tolerating his treatment well except for fatigue. Assessment and Plan Assessment & Plan Stage 2A non-small cell lung cancer, adenocarcinoma Status post right lower lobectomy, currently undergoing adjuvant systemic chemotherapy with cisplatin  and Alimta . Experiencing fatigue, which is expected with this treatment regimen. - Proceed with cycle number three of chemotherapy today - Advise to drink at least two liters of water  per day for the first three days post-treatment to protect renal function due to cisplatin 's nephrotoxic potential - Advise to wait until after the last treatment cycle to resume taking Majorna to avoid potential kidney effects - Advise to sleep separately from his wife and use a different restroom for at least a week after the last cycle to ensure chemotherapy is out of his system  Shortness of breath Experiencing shortness of breath, particularly with exertion and in hot, humid conditions. This may be related to chemotherapy-induced fatigue and anemia.  Mild anemia Mild anemia noted, but hemoglobin levels are adequate for treatment continuation. - Consuming beef and chicken livers to help manage anemia.  Hypertension Blood pressure is slightly elevated, with a reading of 132/62 mmHg after a repeat measurement. - Monitor blood pressure at home The patient was advised to call immediately if he has any concerning symptoms in the interval. The patient voices understanding of current disease status and treatment options and is in agreement with the current care plan.  All questions were answered. The patient knows to call the clinic with any problems, questions or concerns. We can certainly see the patient much sooner if necessary. The total time spent in the appointment was 20 minutes including review of chart and various tests  results, discussions about plan of care and coordination of care plan .   Disclaimer: This note was  dictated with voice recognition software. Similar sounding words can inadvertently be transcribed and may not be corrected upon review.

## 2024-01-24 NOTE — Patient Instructions (Signed)
 [facility] - A DEPT OF [facility]  Discharge Instructions: Thank you for choosing [facility] to provide your oncology and hematology care.   If you have a lab appointment with the Cancer Center, please go directly to the Cancer Center and check in at the registration area.   Wear comfortable clothing and clothing appropriate for easy access to any Portacath or PICC line.   We strive to give you quality time with your provider. You may need to reschedule your appointment if you arrive late (15 or more minutes).  Arriving late affects you and other patients whose appointments are after yours.  Also, if you miss three or more appointments without notifying the office, you may be dismissed from the clinic at the provider's discretion.      For prescription refill requests, have your pharmacy contact our office and allow 72 hours for refills to be completed.    Today you received the following chemotherapy and/or immunotherapy agents: CISplatin  / PEMEtrexed  Disodium     To help prevent nausea and vomiting after your treatment, we encourage you to take your nausea medication as directed.  BELOW ARE SYMPTOMS THAT SHOULD BE REPORTED IMMEDIATELY: *FEVER GREATER THAN 100.4 F (38 C) OR HIGHER *CHILLS OR SWEATING *NAUSEA AND VOMITING THAT IS NOT CONTROLLED WITH YOUR NAUSEA MEDICATION *UNUSUAL SHORTNESS OF BREATH *UNUSUAL BRUISING OR BLEEDING *URINARY PROBLEMS (pain or burning when urinating, or frequent urination) *BOWEL PROBLEMS (unusual diarrhea, constipation, pain near the anus) TENDERNESS IN MOUTH AND THROAT WITH OR WITHOUT PRESENCE OF ULCERS (sore throat, sores in mouth, or a toothache) UNUSUAL RASH, SWELLING OR PAIN  UNUSUAL VAGINAL DISCHARGE OR ITCHING   Items with * indicate a potential emergency and should be followed up as soon as possible or go to the Emergency Department if any problems should occur.  Please show the CHEMOTHERAPY ALERT  CARD or IMMUNOTHERAPY ALERT CARD at check-in to the Emergency Department and triage nurse.  Should you have questions after your visit or need to cancel or reschedule your appointment, please contact [facility] - A DEPT OF [facility]  Dept: [phone]  and follow the prompts.  Office hours are 8:00 a.m. to 4:30 p.m. Monday - Friday. Please note that voicemails left after 4:00 p.m. may not be returned until the following business day.  We are closed weekends and major holidays. You have access to a nurse at all times for urgent questions. Please call the main number to the clinic Dept: [phone] and follow the prompts.   For any non-urgent questions, you may also contact your provider using MyChart. We now offer e-Visits for anyone 41 and older to request care online for non-urgent symptoms. For details visit [email or web address].   Also download the MyChart app! Go to the app store, search MyChart, open the app, select [place], and log in with your MyChart username and password.

## 2024-01-30 ENCOUNTER — Other Ambulatory Visit: Payer: Self-pay

## 2024-01-30 DIAGNOSIS — C3431 Malignant neoplasm of lower lobe, right bronchus or lung: Secondary | ICD-10-CM

## 2024-01-31 ENCOUNTER — Inpatient Hospital Stay

## 2024-01-31 DIAGNOSIS — C3431 Malignant neoplasm of lower lobe, right bronchus or lung: Secondary | ICD-10-CM

## 2024-01-31 DIAGNOSIS — Z79631 Long term (current) use of antimetabolite agent: Secondary | ICD-10-CM | POA: Diagnosis not present

## 2024-01-31 DIAGNOSIS — Z5111 Encounter for antineoplastic chemotherapy: Secondary | ICD-10-CM | POA: Diagnosis not present

## 2024-01-31 DIAGNOSIS — Z79630 Long term (current) use of alkylating agent: Secondary | ICD-10-CM | POA: Diagnosis not present

## 2024-01-31 LAB — COMPREHENSIVE METABOLIC PANEL WITH GFR
ALT: 16 U/L (ref 0–44)
AST: 17 U/L (ref 15–41)
Albumin: 3.9 g/dL (ref 3.5–5.0)
Alkaline Phosphatase: 45 U/L (ref 38–126)
Anion gap: 10 (ref 5–15)
BUN: 20 mg/dL (ref 8–23)
CO2: 23 mmol/L (ref 22–32)
Calcium: 8.9 mg/dL (ref 8.9–10.3)
Chloride: 102 mmol/L (ref 98–111)
Creatinine, Ser: 0.72 mg/dL (ref 0.61–1.24)
GFR, Estimated: >60 mL/min (ref >60)
Glucose, Bld: 98 mg/dL (ref 70–99)
Potassium: 4.2 mmol/L (ref 3.5–5.1)
Sodium: 135 mmol/L (ref 135–145)
Total Bilirubin: 0.9 mg/dL (ref 0.0–1.2)
Total Protein: 7 g/dL (ref 6.5–8.1)

## 2024-01-31 LAB — CBC WITH DIFFERENTIAL/PLATELET
Abs Immature Granulocytes: 0.03 K/uL (ref 0.00–0.07)
Basophils Absolute: 0 K/uL (ref 0.0–0.1)
Basophils Relative: 1 %
Eosinophils Absolute: 0.1 K/uL (ref 0.0–0.5)
Eosinophils Relative: 2 %
HCT: 39.5 % (ref 39.0–52.0)
Hemoglobin: 13.4 g/dL (ref 13.0–17.0)
Immature Granulocytes: 1 %
Lymphocytes Relative: 31 %
Lymphs Abs: 1.7 K/uL (ref 0.7–4.0)
MCH: 31.5 pg (ref 26.0–34.0)
MCHC: 33.9 g/dL (ref 30.0–36.0)
MCV: 92.7 fL (ref 80.0–100.0)
Monocytes Absolute: 1.1 K/uL — ABNORMAL HIGH (ref 0.1–1.0)
Monocytes Relative: 19 %
Neutro Abs: 2.6 K/uL (ref 1.7–7.7)
Neutrophils Relative %: 46 %
Platelets: 175 K/uL (ref 150–400)
RBC: 4.26 MIL/uL (ref 4.22–5.81)
RDW: 14.6 % (ref 11.5–15.5)
WBC: 5.6 K/uL (ref 4.0–10.5)
nRBC: 0 % (ref 0.0–0.2)

## 2024-01-31 LAB — MAGNESIUM: Magnesium: 1.7 mg/dL (ref 1.7–2.4)

## 2024-02-06 ENCOUNTER — Other Ambulatory Visit: Payer: Self-pay

## 2024-02-06 DIAGNOSIS — C3431 Malignant neoplasm of lower lobe, right bronchus or lung: Secondary | ICD-10-CM

## 2024-02-07 ENCOUNTER — Inpatient Hospital Stay: Attending: Physician Assistant

## 2024-02-07 DIAGNOSIS — Z5111 Encounter for antineoplastic chemotherapy: Secondary | ICD-10-CM | POA: Insufficient documentation

## 2024-02-07 DIAGNOSIS — Z79631 Long term (current) use of antimetabolite agent: Secondary | ICD-10-CM | POA: Insufficient documentation

## 2024-02-07 DIAGNOSIS — C3431 Malignant neoplasm of lower lobe, right bronchus or lung: Secondary | ICD-10-CM | POA: Insufficient documentation

## 2024-02-07 DIAGNOSIS — Z79630 Long term (current) use of alkylating agent: Secondary | ICD-10-CM | POA: Diagnosis not present

## 2024-02-07 LAB — CBC WITH DIFFERENTIAL/PLATELET
Abs Immature Granulocytes: 0.01 K/uL (ref 0.00–0.07)
Basophils Absolute: 0 K/uL (ref 0.0–0.1)
Basophils Relative: 0 %
Eosinophils Absolute: 0.1 K/uL (ref 0.0–0.5)
Eosinophils Relative: 1 %
HCT: 39.3 % (ref 39.0–52.0)
Hemoglobin: 13.1 g/dL (ref 13.0–17.0)
Immature Granulocytes: 0 %
Lymphocytes Relative: 30 %
Lymphs Abs: 1.5 K/uL (ref 0.7–4.0)
MCH: 30.8 pg (ref 26.0–34.0)
MCHC: 33.3 g/dL (ref 30.0–36.0)
MCV: 92.3 fL (ref 80.0–100.0)
Monocytes Absolute: 1 K/uL (ref 0.1–1.0)
Monocytes Relative: 19 %
Neutro Abs: 2.5 K/uL (ref 1.7–7.7)
Neutrophils Relative %: 50 %
Platelets: 163 K/uL (ref 150–400)
RBC: 4.26 MIL/uL (ref 4.22–5.81)
RDW: 14.9 % (ref 11.5–15.5)
WBC: 5.1 K/uL (ref 4.0–10.5)
nRBC: 0 % (ref 0.0–0.2)

## 2024-02-07 LAB — COMPREHENSIVE METABOLIC PANEL WITH GFR
ALT: 15 U/L (ref 0–44)
AST: 20 U/L (ref 15–41)
Albumin: 3.9 g/dL (ref 3.5–5.0)
Alkaline Phosphatase: 55 U/L (ref 38–126)
Anion gap: 14 (ref 5–15)
BUN: 18 mg/dL (ref 8–23)
CO2: 20 mmol/L — ABNORMAL LOW (ref 22–32)
Calcium: 8.9 mg/dL (ref 8.9–10.3)
Chloride: 101 mmol/L (ref 98–111)
Creatinine, Ser: 0.71 mg/dL (ref 0.61–1.24)
GFR, Estimated: >60 mL/min (ref >60)
Glucose, Bld: 95 mg/dL (ref 70–99)
Potassium: 4.3 mmol/L (ref 3.5–5.1)
Sodium: 135 mmol/L (ref 135–145)
Total Bilirubin: 0.8 mg/dL (ref 0.0–1.2)
Total Protein: 7.2 g/dL (ref 6.5–8.1)

## 2024-02-07 LAB — MAGNESIUM: Magnesium: 1.8 mg/dL (ref 1.7–2.4)

## 2024-02-14 ENCOUNTER — Telehealth: Payer: Self-pay | Admitting: Internal Medicine

## 2024-02-14 ENCOUNTER — Encounter: Payer: Self-pay | Admitting: Internal Medicine

## 2024-02-14 ENCOUNTER — Inpatient Hospital Stay

## 2024-02-14 ENCOUNTER — Inpatient Hospital Stay (HOSPITAL_BASED_OUTPATIENT_CLINIC_OR_DEPARTMENT_OTHER): Admitting: Internal Medicine

## 2024-02-14 VITALS — BP 138/84 | HR 65 | Temp 98.2°F | Resp 18 | Ht 66.0 in | Wt 247.0 lb

## 2024-02-14 VITALS — BP 128/70 | HR 65 | Temp 98.0°F | Resp 20

## 2024-02-14 DIAGNOSIS — C3400 Malignant neoplasm of unspecified main bronchus: Secondary | ICD-10-CM

## 2024-02-14 DIAGNOSIS — Z79631 Long term (current) use of antimetabolite agent: Secondary | ICD-10-CM | POA: Diagnosis not present

## 2024-02-14 DIAGNOSIS — C3431 Malignant neoplasm of lower lobe, right bronchus or lung: Secondary | ICD-10-CM | POA: Diagnosis not present

## 2024-02-14 DIAGNOSIS — Z5111 Encounter for antineoplastic chemotherapy: Secondary | ICD-10-CM | POA: Diagnosis not present

## 2024-02-14 DIAGNOSIS — C3490 Malignant neoplasm of unspecified part of unspecified bronchus or lung: Secondary | ICD-10-CM

## 2024-02-14 DIAGNOSIS — Z79630 Long term (current) use of alkylating agent: Secondary | ICD-10-CM | POA: Diagnosis not present

## 2024-02-14 LAB — CBC WITH DIFFERENTIAL (CANCER CENTER ONLY)
Abs Immature Granulocytes: 0.01 K/uL (ref 0.00–0.07)
Basophils Absolute: 0 K/uL (ref 0.0–0.1)
Basophils Relative: 0 %
Eosinophils Absolute: 0 K/uL (ref 0.0–0.5)
Eosinophils Relative: 0 %
HCT: 34.7 % — ABNORMAL LOW (ref 39.0–52.0)
Hemoglobin: 11.9 g/dL — ABNORMAL LOW (ref 13.0–17.0)
Immature Granulocytes: 0 %
Lymphocytes Relative: 17 %
Lymphs Abs: 0.5 K/uL — ABNORMAL LOW (ref 0.7–4.0)
MCH: 31.2 pg (ref 26.0–34.0)
MCHC: 34.3 g/dL (ref 30.0–36.0)
MCV: 91.1 fL (ref 80.0–100.0)
Monocytes Absolute: 0.4 K/uL (ref 0.1–1.0)
Monocytes Relative: 11 %
Neutro Abs: 2.2 K/uL (ref 1.7–7.7)
Neutrophils Relative %: 72 %
Platelet Count: 215 K/uL (ref 150–400)
RBC: 3.81 MIL/uL — ABNORMAL LOW (ref 4.22–5.81)
RDW: 15.6 % — ABNORMAL HIGH (ref 11.5–15.5)
WBC Count: 3.2 K/uL — ABNORMAL LOW (ref 4.0–10.5)
nRBC: 0 % (ref 0.0–0.2)

## 2024-02-14 LAB — MAGNESIUM: Magnesium: 1.6 mg/dL — ABNORMAL LOW (ref 1.7–2.4)

## 2024-02-14 LAB — CMP (CANCER CENTER ONLY)
ALT: 9 U/L (ref 0–44)
AST: 14 U/L — ABNORMAL LOW (ref 15–41)
Albumin: 4.2 g/dL (ref 3.5–5.0)
Alkaline Phosphatase: 48 U/L (ref 38–126)
Anion gap: 8 (ref 5–15)
BUN: 24 mg/dL — ABNORMAL HIGH (ref 8–23)
CO2: 24 mmol/L (ref 22–32)
Calcium: 9.2 mg/dL (ref 8.9–10.3)
Chloride: 104 mmol/L (ref 98–111)
Creatinine: 0.67 mg/dL (ref 0.61–1.24)
GFR, Estimated: >60 mL/min (ref >60)
Glucose, Bld: 166 mg/dL — ABNORMAL HIGH (ref 70–99)
Potassium: 4.1 mmol/L (ref 3.5–5.1)
Sodium: 136 mmol/L (ref 135–145)
Total Bilirubin: 0.4 mg/dL (ref 0.0–1.2)
Total Protein: 6.7 g/dL (ref 6.5–8.1)

## 2024-02-14 MED ORDER — SODIUM CHLORIDE 0.9 % IV SOLN
150.0000 mg | Freq: Once | INTRAVENOUS | Status: AC
  Administered 2024-02-14: 150 mg via INTRAVENOUS
  Filled 2024-02-14: qty 150

## 2024-02-14 MED ORDER — SODIUM CHLORIDE 0.9 % IV SOLN
INTRAVENOUS | Status: DC
Start: 2024-02-14 — End: 2024-02-14

## 2024-02-14 MED ORDER — SODIUM CHLORIDE 0.9 % IV SOLN
75.0000 mg/m2 | Freq: Once | INTRAVENOUS | Status: AC
  Administered 2024-02-14: 168 mg via INTRAVENOUS
  Filled 2024-02-14: qty 168

## 2024-02-14 MED ORDER — PALONOSETRON HCL INJECTION 0.25 MG/5ML
0.2500 mg | Freq: Once | INTRAVENOUS | Status: AC
  Administered 2024-02-14: 0.25 mg via INTRAVENOUS
  Filled 2024-02-14: qty 5

## 2024-02-14 MED ORDER — MAGNESIUM SULFATE 2 GM/50ML IV SOLN
2.0000 g | Freq: Once | INTRAVENOUS | Status: AC
  Administered 2024-02-14: 2 g via INTRAVENOUS
  Filled 2024-02-14: qty 50

## 2024-02-14 MED ORDER — SODIUM CHLORIDE 0.9 % IV SOLN
500.0000 mg/m2 | Freq: Once | INTRAVENOUS | Status: AC
  Administered 2024-02-14: 1100 mg via INTRAVENOUS
  Filled 2024-02-14: qty 40

## 2024-02-14 MED ORDER — DEXAMETHASONE SODIUM PHOSPHATE 10 MG/ML IJ SOLN
10.0000 mg | Freq: Once | INTRAMUSCULAR | Status: AC
  Administered 2024-02-14: 10 mg via INTRAVENOUS
  Filled 2024-02-14: qty 1

## 2024-02-14 MED ORDER — POTASSIUM CHLORIDE IN NACL 20-0.9 MEQ/L-% IV SOLN
Freq: Once | INTRAVENOUS | Status: AC
  Filled 2024-02-14: qty 1000

## 2024-02-14 NOTE — Telephone Encounter (Signed)
 Scheduled patient appointments per LOS notes. Left voicemail to let the patient know of the appointments.

## 2024-02-14 NOTE — Progress Notes (Signed)
 [facility] [phone]   [phone]) [phone]  OFFICE PROGRESS NOTE  Lari Elspeth BRAVO, MD [address] [place] [address]  DIAGNOSIS: Stage IIA (T2b, N0, M0) non-small cell lung cancer, adenocarcinoma. He presented with a right lower lobe lung mass. He was diagnosed in March 2025   Biomarker Findings HRD signature - HRDsig Negative Microsatellite status - MS-Stable Tumor Mutational Burden - 0 Muts/Mb Genomic Findings For a complete list of the genes assayed, please refer to the Appendix. CDKN2A loss KRAS G12V STK11 loss CDKN2B loss 7 Disease relevant genes with no reportable alterations: ALK, BRAF, EGFR, ERBB2, MET, RET, ROS1   PDL1: 0%   PRIOR THERAPY: 1) s/p Right lower lobectomy for  under the care of Dr. Shyrl which was completed on 10/25/23    CURRENT THERAPY: Adjuvant chemotherapy with cisplatin 75 mg/m2 and alimta 500 mg/m2 IV every 3 weeks. First dose expected 12/14/23.  Status post 3 cycles.  INTERVAL HISTORY: LUISALBERTO BEEGLE 73 y.o. male returns to the clinic today for follow-up visit.  Discussed the use of AI scribe software for clinical note transcription with the patient, who gave verbal consent to proceed.  History of Present Illness MONTRICE GRACEY is a 73 year old male with stage IIA non-small cell lung cancer who presents for evaluation before starting cycle four of chemotherapy.  He was diagnosed with stage IIA non-small cell lung cancer, adenocarcinoma, in March 2025. The cancer has no actionable mutation and negative PD-L1 expression. He underwent a right lower lobectomy and is currently receiving adjuvant systemic chemotherapy with cisplatin and pemetrexed. He has completed three cycles and is here for evaluation before starting the fourth cycle.  He feels 'terrible' with worsening symptoms after each chemotherapy cycle. Significant fatigue is his primary complaint, and he experiences dyspnea on exertion. No neuropathy,  numbness, tingling, or hearing deficits are present.  He is taking magnesium oxide, one tablet daily. He has an exercise room at home but finds exercising difficult due to fatigue, especially in hot and humid conditions.      MEDICAL HISTORY: Past Medical History:  Diagnosis Date   Aortic root dilatation (HCC)    Arthritis    Coronary atherosclerosis of native coronary artery    Multiple stents - RCA/LAD/diagonal, overlapping DES LAD, stent thrombosis 2005, ultimately CABG   Essential hypertension    H/O hiatal hernia    History of GI bleed    On nonsteroidals   Hyperlipemia    Myocardial infarction [facility])    Anterior with LAD stent thrombosis 2005, 2004 and 2005    Obesity    Paroxysmal atrial flutter (HCC)    Sleep apnea    CPAP daily    ALLERGIES:  is allergic to bee venom, other, and chlorhexidine.  MEDICATIONS:  Current Outpatient Medications  Medication Sig Dispense Refill   acetaminophen (TYLENOL) 500 MG tablet Take 1-2 tablets (500-1,000 mg total) by mouth every 6 (six) hours as needed.     aspirin 81 MG EC tablet Take 81 mg by mouth at bedtime.      atorvastatin (LIPITOR) 20 MG tablet Take 1 tablet (20 mg total) by mouth every evening.     b complex vitamins tablet 1 tablet daily. B12-B6-Folic Acid (Sublingual)     Cholecalciferol (VITAMIN D) 125 MCG (5000 UT) CAPS Take 5,000 Units by mouth daily.      Coenzyme Q10 (CO Q-10) 100 MG CAPS Take 100 mg by mouth daily.  cyclobenzaprine (FLEXERIL) 10 MG tablet Take 1 tablet (10 mg total) by mouth 3 (three) times daily as needed for muscle spasms. 30 tablet 0   dexamethasone (DECADRON) 4 MG tablet Take 1 tablet twice a day the day before, the day of, and the day after chemotherapy 40 tablet 2   EPINEPHrine 0.3 mg/0.3 mL IJ SOAJ injection Inject 0.3 mLs into the skin as needed for anaphylaxis.      folic acid (FOLVITE) 1 MG tablet Take 1 tablet (1 mg total) by mouth daily. 30 tablet 2   gabapentin (NEURONTIN) 300 MG  capsule Take 1 capsule (300 mg total) by mouth at bedtime for 2 days, THEN 1 capsule (300 mg total) 2 (two) times daily. 62 capsule 0   Krill Oil 1000 MG CAPS Take 1,000 mg by mouth daily.     loratadine (CLARITIN) 10 MG tablet Take 10 mg by mouth daily as needed for allergies.     losartan (COZAAR) 100 MG tablet Take 100 mg by mouth daily.     LYCOPENE PO Take 1 tablet by mouth daily.     magnesium oxide (MAG-OX) 400 (240 Mg) MG tablet Take 1 tablet (400 mg total) by mouth daily. 30 tablet 0   metoprolol (LOPRESSOR) 50 MG tablet Take 50 mg by mouth 2 (two) times daily.     MOUNJARO 15 MG/0.5ML Pen Inject 15 mg into the skin once a week. Mondays     Multiple Vitamins-Minerals (EYE VITAMINS PO) Take 1 capsule by mouth daily. [employer] [facility]     naproxen (NAPROSYN) 500 MG tablet Take 500 mg by mouth 2 (two) times daily with a meal.     nitroGLYCERIN (NITROSTAT) 0.4 MG SL tablet Place 0.4 mg under the tongue every 5 (five) minutes as needed for chest pain. Up to 3x     OLIVE LEAF EXTRACT PO Take 1 capsule by mouth daily. 20,000 mg     ondansetron (ZOFRAN) 8 MG tablet Take 1 tablet (8 mg total) by mouth every 8 (eight) hours as needed for nausea or vomiting. Starting day 3 after chemo 30 tablet 2   Pomegranate, Punica granatum, (POMEGRANATE PO) Take 5,000 mg by mouth daily.     prochlorperazine (COMPAZINE) 10 MG tablet Take 1 tablet (10 mg total) by mouth every 6 (six) hours as needed. 30 tablet 2   Tadalafil 2.5 MG TABS Take 2.5 mg by mouth every morning.     Turmeric 500 MG TABS Take 500 mg by mouth 2 (two) times daily.      No current facility-administered medications for this visit.    SURGICAL HISTORY:  Past Surgical History:  Procedure Laterality Date   APPENDECTOMY     BREATH TEK H PYLORI  06/11/2012   Procedure: BREATH TEK H PYLORI;  Surgeon: Alm VEAR Angle, MD;  Location: [facility];  Service: General;  Laterality: N/AMERL Odor   BRONCHIAL BIOPSY  09/11/2023   Procedure: BRONCHOSCOPY,  WITH BIOPSY;  Surgeon: Shelah Lamar RAMAN, MD;  [facility];  Service: Pulmonary;;   BRONCHIAL BRUSHINGS  09/11/2023   Procedure: BRONCHOSCOPY, WITH BRUSH BIOPSY;  Surgeon: Shelah Lamar RAMAN, MD;  Location: [facility];  Service: Pulmonary;;   BRONCHIAL NEEDLE ASPIRATION BIOPSY  09/11/2023   Procedure: BRONCHOSCOPY, WITH NEEDLE ASPIRATION BIOPSY;  Surgeon: Shelah Lamar RAMAN, MD;  Location: [facility];  Service: Pulmonary;;   CARDIAC SURGERY     CARPAL TUNNEL RELEASE Right 12/02/2010   CARPAL TUNNEL RELEASE  06/06/2011   Procedure: CARPAL TUNNEL  RELEASE;  Surgeon: Lamar LULLA Leonor Mickey., MD;  Location: [facility];  Service: Orthopedics;  Laterality: Right;   CARPAL TUNNEL RELEASE Right 04/10/2013   Procedure: REVISION OF CARPAL TUNNEL RELEASE LIGAMENT RIGHT WRIST;  Surgeon: Lamar LULLA Leonor Mickey., MD;  Location: [facility];  Service: Orthopedics;  Laterality: Right;   CERVICAL FUSION  03/03/2009   CHOLECYSTECTOMY     CORONARY ANGIOPLASTY WITH STENT PLACEMENT  [phone]   CORONARY ARTERY BYPASS GRAFT N/A 12/30/2013   Procedure: CORONARY ARTERY BYPASS GRAFTING (CABG) x three, using left internal mammary artery and right leg greater sapheneous vein harvested endoscopically - LIMA to LAD, SVG-D, SVG-OM1;  Surgeon: Maude Fleeta Ochoa, MD;  Location: [facility] OR;  Service: Open Heart Surgery;  Laterality: N/A;   ELBOW ARTHROSCOPY Right    FUDUCIAL PLACEMENT  09/11/2023   Procedure: INSERTION, FIDUCIAL MARKER, GOLD;  Surgeon: Shelah Lamar RAMAN, MD;  Location: [facility];  Service: Pulmonary;;   INTERCOSTAL NERVE BLOCK Right 10/25/2023   Procedure: BLOCK, NERVE, INTERCOSTAL;  Surgeon: Shyrl Linnie KIDD, MD;  Location: [facility];  Service: Thoracic;  Laterality: Right;   IRRIGATION AND DEBRIDEMENT SHOULDER Right 04/30/2020   Procedure: Right shoulder poly exchange and possible glensphere exchange and left shoulder injection;  Surgeon: Kay Kemps, MD;  Location: [facility];  Service:  Orthopedics;  Laterality: Right;   LAPAROSCOPIC GASTRIC BANDING  08/31/2012   left carpal tunnel release      LEFT HEART CATHETERIZATION WITH CORONARY ANGIOGRAM N/A 12/26/2013   Procedure: LEFT HEART CATHETERIZATION WITH CORONARY ANGIOGRAM;  Surgeon: Alm LELON Clay, MD;  Location: [facility] CATH LAB;  Service: Cardiovascular;  Laterality: N/A;   LOBECTOMY, LUNG, ROBOT-ASSISTED, USING VATS Right 10/25/2023   Procedure: LOBECTOMY, RIGHT LOWER LUNG, ROBOT-ASSISTED, USING VATS;  Surgeon: Shyrl Linnie KIDD, MD;  Location: [facility];  Service: Thoracic;  Laterality: Right;  RIGHT ROBOTIC ASSISTED THORACOSCOPY FOR RIGHT LOWER LOBECTOMY   LYMPH NODE BIOPSY Right 10/25/2023   Procedure: LYMPH NODE BIOPSY;  Surgeon: Shyrl Linnie KIDD, MD;  Location: [facility];  Service: Thoracic;  Laterality: Right;   REVERSE SHOULDER ARTHROPLASTY Right 01/30/2020   Procedure: REVERSE SHOULDER ARTHROPLASTY;  Surgeon: Kay Kemps, MD;  Location: [facility];  Service: Orthopedics;  Laterality: Right;  interscalene block   REVERSE SHOULDER ARTHROPLASTY Left 03/30/2023   Procedure: REVERSE SHOULDER ARTHROPLASTY;  Surgeon: Kay Kemps, MD;  Location: [facility];  Service: Orthopedics;  Laterality: Left;   SHOULDER ARTHROSCOPY Right 10/01/2009   TOTAL KNEE ARTHROPLASTY Right 07/03/2004   TOTAL KNEE ARTHROPLASTY Left 07/03/2005   ULNAR NERVE TRANSPOSITION  06/06/2011   Procedure: ULNAR NERVE DECOMPRESSION/TRANSPOSITION;  Surgeon: Lamar LULLA Leonor Mickey., MD;  Location: [facility];  Service: Orthopedics;  Laterality: Right;  decompression ulnar nerve right cubital tunnel     REVIEW OF SYSTEMS:  A comprehensive review of systems was negative except for: Constitutional: positive for fatigue Respiratory: positive for dyspnea on exertion   PHYSICAL EXAMINATION: General appearance: alert, cooperative, fatigued, and no distress Head: Normocephalic, without obvious abnormality, atraumatic Neck: no adenopathy, no JVD, supple,  symmetrical, trachea midline, and thyroid not enlarged, symmetric, no tenderness/mass/nodules Lymph nodes: Cervical, supraclavicular, and axillary nodes normal. Resp: clear to auscultation bilaterally Back: symmetric, no curvature. ROM normal. No CVA tenderness. Cardio: regular rate and rhythm, S1, S2 normal, no murmur, click, rub or gallop GI: soft, non-tender; bowel sounds normal; no masses,  no organomegaly Extremities: extremities normal, atraumatic, no cyanosis or edema  ECOG PERFORMANCE STATUS: 1 - Symptomatic but  completely ambulatory  Blood pressure 138/84, pulse 65, temperature 98.2 F (36.8 C), temperature source Temporal, resp. rate 18, height 5' 6 (1.676 m), weight 247 lb (112 kg), SpO2 99%.  LABORATORY DATA: Lab Results  Component Value Date   WBC 3.2 (L) 02/14/2024   HGB 11.9 (L) 02/14/2024   HCT 34.7 (L) 02/14/2024   MCV 91.1 02/14/2024   PLT 215 02/14/2024      Chemistry      Component Value Date/Time   NA 135 02/07/2024 1001   K 4.3 02/07/2024 1001   CL 101 02/07/2024 1001   CO2 20 (L) 02/07/2024 1001   BUN 18 02/07/2024 1001   CREATININE 0.71 02/07/2024 1001   CREATININE 0.71 01/24/2024 0753      Component Value Date/Time   CALCIUM 8.9 02/07/2024 1001   ALKPHOS 55 02/07/2024 1001   AST 20 02/07/2024 1001   AST 13 (L) 01/24/2024 0753   ALT 15 02/07/2024 1001   ALT 11 01/24/2024 0753   BILITOT 0.8 02/07/2024 1001   BILITOT 0.4 01/24/2024 0753       RADIOGRAPHIC STUDIES: No results found.   ASSESSMENT AND PLAN: This is a very pleasant 73 years old white male with Stage IIA (T2b, N0, M0) non-small cell lung cancer, adenocarcinoma. He presented with a right lower lobe lung mass. He was diagnosed in March 2025.  He is status post right lower lobectomy with lymph node sampling under the care of Dr. Shyrl.  The patient is currently undergoing adjuvant systemic chemotherapy with cisplatin 75 Mg/M2 and Alimta 500 Mg/M2 every 3 weeks.  He is status post  3 cycles. He is tolerating his treatment well except for fatigue. Assessment and Plan Assessment & Plan Stage IIA non-small cell lung cancer, right lower lobe, post-lobectomy, on adjuvant chemotherapy Stage IIA non-small cell lung cancer, adenocarcinoma type, located in the right lower lobe. Status post right lower lobectomy. Currently undergoing adjuvant chemotherapy with cisplatin and pemetrexed. He is post three cycles and is here for evaluation before starting the fourth cycle. No actionable mutation and negative PD-L1 expression. The treatment is nearing completion with today being the last cycle. A CT scan is planned in a month to assess the response to treatment. - Order CT scan of the chest in one month to assess treatment response  Chemotherapy-induced fatigue Experiencing worsening fatigue with each cycle of chemotherapy. Fatigue is significant and impacts daily activities. No neuropathy, numbness, tingling, or hearing deficits reported. Advised that exercise can help mitigate fatigue. - Encourage regular exercise to help combat fatigue, such as walking in air-conditioned environments or using home exercise equipment  Hypomagnesemia Mild hypomagnesemia noted on recent blood work. Currently taking magnesium oxide once daily. - Increase magnesium oxide to two tablets daily for a few days to address hypomagnesemia The patient was advised to call immediately if he has any concerning symptoms in the interval.  The patient voices understanding of current disease status and treatment options and is in agreement with the current care plan.  All questions were answered. The patient knows to call the clinic with any problems, questions or concerns. We can certainly see the patient much sooner if necessary. The total time spent in the appointment was 20 minutes including review of chart and various tests results, discussions about plan of care and coordination of care plan .   Disclaimer: This  note was dictated with voice recognition software. Similar sounding words can inadvertently be transcribed and may not be corrected upon review.

## 2024-02-14 NOTE — Patient Instructions (Addendum)
 [facility] - A DEPT OF [facility]  Discharge Instructions: Thank you for choosing [facility] to provide your oncology and hematology care.   If you have a lab appointment with the Cancer Center, please go directly to the Cancer Center and check in at the registration area.   Wear comfortable clothing and clothing appropriate for easy access to any Portacath or PICC line.   We strive to give you quality time with your provider. You may need to reschedule your appointment if you arrive late (15 or more minutes).  Arriving late affects you and other patients whose appointments are after yours.  Also, if you miss three or more appointments without notifying the office, you may be dismissed from the clinic at the provider's discretion.      For prescription refill requests, have your pharmacy contact our office and allow 72 hours for refills to be completed.    Today you received the following chemotherapy and/or immunotherapy agents: Pemetrexed (Alimta) and Cisplatin   To help prevent nausea and vomiting after your treatment, we encourage you to take your nausea medication as directed.  BELOW ARE SYMPTOMS THAT SHOULD BE REPORTED IMMEDIATELY: *FEVER GREATER THAN 100.4 F (38 C) OR HIGHER *CHILLS OR SWEATING *NAUSEA AND VOMITING THAT IS NOT CONTROLLED WITH YOUR NAUSEA MEDICATION *UNUSUAL SHORTNESS OF BREATH *UNUSUAL BRUISING OR BLEEDING *URINARY PROBLEMS (pain or burning when urinating, or frequent urination) *BOWEL PROBLEMS (unusual diarrhea, constipation, pain near the anus) TENDERNESS IN MOUTH AND THROAT WITH OR WITHOUT PRESENCE OF ULCERS (sore throat, sores in mouth, or a toothache) UNUSUAL RASH, SWELLING OR PAIN  UNUSUAL VAGINAL DISCHARGE OR ITCHING   Items with * indicate a potential emergency and should be followed up as soon as possible or go to the Emergency Department if any problems should occur.  Please show the CHEMOTHERAPY ALERT  CARD or IMMUNOTHERAPY ALERT CARD at check-in to the Emergency Department and triage nurse.  Should you have questions after your visit or need to cancel or reschedule your appointment, please contact [facility] - A DEPT OF [facility]  Dept: [phone]  and follow the prompts.  Office hours are 8:00 a.m. to 4:30 p.m. Monday - Friday. Please note that voicemails left after 4:00 p.m. may not be returned until the following business day.  We are closed weekends and major holidays. You have access to a nurse at all times for urgent questions. Please call the main number to the clinic Dept: [phone] and follow the prompts.   For any non-urgent questions, you may also contact your provider using MyChart. We now offer e-Visits for anyone 5 and older to request care online for non-urgent symptoms. For details visit [email or web address].   Also download the MyChart app! Go to the app store, search MyChart, open the app, select [place], and log in with your MyChart username and password.  Hypomagnesemia Hypomagnesemia is a condition in which the level of magnesium in the blood is too low. Magnesium is a mineral that is found in many foods. It is used in many different processes in the body. Hypomagnesemia can affect every organ in the body. In severe cases, it can cause life-threatening problems. What are the causes? This condition may be caused by: Not getting enough magnesium in your diet or not having enough healthy foods to eat (malnutrition). Problems with magnesium absorption in the intestines. Dehydration. Excessive use of alcohol. Vomiting.  Severe or long-term (chronic) diarrhea. Some medicines, including medicines that make you urinate more often (diuretics). Certain diseases, such as kidney disease, diabetes, celiac disease, and overactive thyroid. What are the signs or symptoms? Symptoms of this condition include: Loss of appetite, nausea, and  vomiting. Involuntary shaking or trembling of a body part (tremor). Muscle weakness or tingling in the arms and legs. Sudden tightening of muscles (muscle spasms). Confusion. Psychiatric issues, such as: Depression and irritability. Psychosis. A feeling of fluttering of the heart (palpitations). Seizures. These symptoms are more severe if magnesium levels drop suddenly. How is this diagnosed? This condition may be diagnosed based on: Your symptoms and medical history. A physical exam. Blood and urine tests. How is this treated? Treatment depends on the cause and the severity of the condition. It may be treated by: Taking a magnesium supplement. This can be taken in pill form. If the condition is severe, magnesium is usually given through an IV. Making changes to your diet. You may be directed to eat foods that have a lot of magnesium, such as green leafy vegetables, peas, beans, and nuts. Not drinking alcohol. If you are struggling not to drink, ask your health care provider for help. Follow these instructions at home: Eating and drinking     Make sure that your diet includes foods with magnesium. Foods that have a lot of magnesium in them include: Green leafy vegetables, such as spinach and broccoli. Beans and peas. Nuts and seeds, such as almonds and sunflower seeds. Whole grains, such as whole grain bread and fortified cereals. Drink fluids that contain salts and minerals (electrolytes), such as sports drinks, when you are active. Do not drink alcohol. General instructions Take over-the-counter and prescription medicines only as told by your health care provider. Take magnesium supplements as directed if your health care provider tells you to take them. Have your magnesium levels monitored as told by your health care provider. Keep all follow-up visits. This is important. Contact a health care provider if: You get worse instead of better. Your symptoms return. Get help  right away if: You develop severe muscle weakness. You have trouble breathing. You feel that your heart is racing. These symptoms may represent a serious problem that is an emergency. Do not wait to see if the symptoms will go away. Get medical help right away. Call your local emergency services (911 in the [place] Do not drive yourself to the hospital. Summary Hypomagnesemia is a condition in which the level of magnesium in the blood is too low. Hypomagnesemia can affect every organ in the body. Treatment may include eating more foods that contain magnesium, taking magnesium supplements, and not drinking alcohol. Have your magnesium levels monitored as told by your health care provider. This information is not intended to replace advice given to you by your health care provider. Make sure you discuss any questions you have with your health care provider. Document Revised: 11/16/2020 Document Reviewed: 11/16/2020 Elsevier Patient Education  2024 [employer].

## 2024-02-15 ENCOUNTER — Other Ambulatory Visit: Payer: Self-pay

## 2024-02-19 ENCOUNTER — Other Ambulatory Visit: Payer: Self-pay

## 2024-02-19 DIAGNOSIS — C3490 Malignant neoplasm of unspecified part of unspecified bronchus or lung: Secondary | ICD-10-CM

## 2024-02-20 ENCOUNTER — Other Ambulatory Visit: Payer: Self-pay

## 2024-02-20 DIAGNOSIS — C3490 Malignant neoplasm of unspecified part of unspecified bronchus or lung: Secondary | ICD-10-CM

## 2024-02-21 ENCOUNTER — Inpatient Hospital Stay

## 2024-02-21 DIAGNOSIS — C3431 Malignant neoplasm of lower lobe, right bronchus or lung: Secondary | ICD-10-CM | POA: Diagnosis not present

## 2024-02-21 DIAGNOSIS — C3490 Malignant neoplasm of unspecified part of unspecified bronchus or lung: Secondary | ICD-10-CM

## 2024-02-21 DIAGNOSIS — Z79631 Long term (current) use of antimetabolite agent: Secondary | ICD-10-CM | POA: Diagnosis not present

## 2024-02-21 DIAGNOSIS — Z79630 Long term (current) use of alkylating agent: Secondary | ICD-10-CM | POA: Diagnosis not present

## 2024-02-21 DIAGNOSIS — Z5111 Encounter for antineoplastic chemotherapy: Secondary | ICD-10-CM | POA: Diagnosis not present

## 2024-02-21 LAB — COMPREHENSIVE METABOLIC PANEL WITH GFR
ALT: 16 U/L (ref 0–44)
AST: 17 U/L (ref 15–41)
Albumin: 3.9 g/dL (ref 3.5–5.0)
Alkaline Phosphatase: 46 U/L (ref 38–126)
Anion gap: 14 (ref 5–15)
BUN: 18 mg/dL (ref 8–23)
CO2: 21 mmol/L — ABNORMAL LOW (ref 22–32)
Calcium: 9.1 mg/dL (ref 8.9–10.3)
Chloride: 99 mmol/L (ref 98–111)
Creatinine, Ser: 0.91 mg/dL (ref 0.61–1.24)
GFR, Estimated: >60 mL/min (ref >60)
Glucose, Bld: 98 mg/dL (ref 70–99)
Potassium: 4.2 mmol/L (ref 3.5–5.1)
Sodium: 134 mmol/L — ABNORMAL LOW (ref 135–145)
Total Bilirubin: 0.7 mg/dL (ref 0.0–1.2)
Total Protein: 6.8 g/dL (ref 6.5–8.1)

## 2024-02-21 LAB — CBC WITH DIFFERENTIAL/PLATELET
Abs Immature Granulocytes: 0.02 K/uL (ref 0.00–0.07)
Basophils Absolute: 0 K/uL (ref 0.0–0.1)
Basophils Relative: 0 %
Eosinophils Absolute: 0.1 K/uL (ref 0.0–0.5)
Eosinophils Relative: 1 %
HCT: 37.8 % — ABNORMAL LOW (ref 39.0–52.0)
Hemoglobin: 13 g/dL (ref 13.0–17.0)
Immature Granulocytes: 0 %
Lymphocytes Relative: 36 %
Lymphs Abs: 1.9 K/uL (ref 0.7–4.0)
MCH: 31.9 pg (ref 26.0–34.0)
MCHC: 34.4 g/dL (ref 30.0–36.0)
MCV: 92.6 fL (ref 80.0–100.0)
Monocytes Absolute: 1.3 K/uL — ABNORMAL HIGH (ref 0.1–1.0)
Monocytes Relative: 25 %
Neutro Abs: 2 K/uL (ref 1.7–7.7)
Neutrophils Relative %: 38 %
Platelets: 172 K/uL (ref 150–400)
RBC: 4.08 MIL/uL — ABNORMAL LOW (ref 4.22–5.81)
RDW: 15.2 % (ref 11.5–15.5)
WBC: 5.3 K/uL (ref 4.0–10.5)
nRBC: 0 % (ref 0.0–0.2)

## 2024-02-21 LAB — MAGNESIUM: Magnesium: 1.5 mg/dL — ABNORMAL LOW (ref 1.7–2.4)

## 2024-02-22 ENCOUNTER — Other Ambulatory Visit: Payer: Self-pay

## 2024-02-25 ENCOUNTER — Encounter: Payer: Self-pay | Admitting: Internal Medicine

## 2024-02-25 ENCOUNTER — Other Ambulatory Visit: Payer: Self-pay | Admitting: Physician Assistant

## 2024-02-25 DIAGNOSIS — C3491 Malignant neoplasm of unspecified part of right bronchus or lung: Secondary | ICD-10-CM

## 2024-02-27 ENCOUNTER — Other Ambulatory Visit: Payer: Self-pay

## 2024-02-27 DIAGNOSIS — C3491 Malignant neoplasm of unspecified part of right bronchus or lung: Secondary | ICD-10-CM

## 2024-02-28 ENCOUNTER — Inpatient Hospital Stay

## 2024-02-28 DIAGNOSIS — Z79631 Long term (current) use of antimetabolite agent: Secondary | ICD-10-CM | POA: Diagnosis not present

## 2024-02-28 DIAGNOSIS — Z5111 Encounter for antineoplastic chemotherapy: Secondary | ICD-10-CM | POA: Diagnosis not present

## 2024-02-28 DIAGNOSIS — Z79630 Long term (current) use of alkylating agent: Secondary | ICD-10-CM | POA: Diagnosis not present

## 2024-02-28 DIAGNOSIS — C3491 Malignant neoplasm of unspecified part of right bronchus or lung: Secondary | ICD-10-CM

## 2024-02-28 DIAGNOSIS — C3431 Malignant neoplasm of lower lobe, right bronchus or lung: Secondary | ICD-10-CM | POA: Diagnosis not present

## 2024-02-28 LAB — COMPREHENSIVE METABOLIC PANEL WITH GFR
ALT: 13 U/L (ref 0–44)
AST: 19 U/L (ref 15–41)
Albumin: 3.9 g/dL (ref 3.5–5.0)
Alkaline Phosphatase: 50 U/L (ref 38–126)
Anion gap: 13 (ref 5–15)
BUN: 24 mg/dL — ABNORMAL HIGH (ref 8–23)
CO2: 20 mmol/L — ABNORMAL LOW (ref 22–32)
Calcium: 9.1 mg/dL (ref 8.9–10.3)
Chloride: 101 mmol/L (ref 98–111)
Creatinine, Ser: 0.84 mg/dL (ref 0.61–1.24)
GFR, Estimated: >60 mL/min (ref >60)
Glucose, Bld: 126 mg/dL — ABNORMAL HIGH (ref 70–99)
Potassium: 4.4 mmol/L (ref 3.5–5.1)
Sodium: 134 mmol/L — ABNORMAL LOW (ref 135–145)
Total Bilirubin: 0.8 mg/dL (ref 0.0–1.2)
Total Protein: 7.2 g/dL (ref 6.5–8.1)

## 2024-02-28 LAB — CBC WITH DIFFERENTIAL/PLATELET
Abs Immature Granulocytes: 0.01 K/uL (ref 0.00–0.07)
Basophils Absolute: 0 K/uL (ref 0.0–0.1)
Basophils Relative: 0 %
Eosinophils Absolute: 0.1 K/uL (ref 0.0–0.5)
Eosinophils Relative: 1 %
HCT: 36.9 % — ABNORMAL LOW (ref 39.0–52.0)
Hemoglobin: 12.3 g/dL — ABNORMAL LOW (ref 13.0–17.0)
Immature Granulocytes: 0 %
Lymphocytes Relative: 24 %
Lymphs Abs: 1.6 K/uL (ref 0.7–4.0)
MCH: 31.2 pg (ref 26.0–34.0)
MCHC: 33.3 g/dL (ref 30.0–36.0)
MCV: 93.7 fL (ref 80.0–100.0)
Monocytes Absolute: 1.1 K/uL — ABNORMAL HIGH (ref 0.1–1.0)
Monocytes Relative: 16 %
Neutro Abs: 3.9 K/uL (ref 1.7–7.7)
Neutrophils Relative %: 59 %
Platelets: 164 K/uL (ref 150–400)
RBC: 3.94 MIL/uL — ABNORMAL LOW (ref 4.22–5.81)
RDW: 15.4 % (ref 11.5–15.5)
WBC: 6.6 K/uL (ref 4.0–10.5)
nRBC: 0 % (ref 0.0–0.2)

## 2024-03-04 DIAGNOSIS — G4733 Obstructive sleep apnea (adult) (pediatric): Secondary | ICD-10-CM | POA: Diagnosis not present

## 2024-03-04 DIAGNOSIS — R7303 Prediabetes: Secondary | ICD-10-CM | POA: Diagnosis not present

## 2024-03-04 DIAGNOSIS — C3491 Malignant neoplasm of unspecified part of right bronchus or lung: Secondary | ICD-10-CM | POA: Diagnosis not present

## 2024-03-04 DIAGNOSIS — E871 Hypo-osmolality and hyponatremia: Secondary | ICD-10-CM | POA: Diagnosis not present

## 2024-03-07 ENCOUNTER — Other Ambulatory Visit: Payer: Self-pay

## 2024-03-07 DIAGNOSIS — C3491 Malignant neoplasm of unspecified part of right bronchus or lung: Secondary | ICD-10-CM

## 2024-03-10 ENCOUNTER — Inpatient Hospital Stay

## 2024-03-10 ENCOUNTER — Inpatient Hospital Stay: Attending: Physician Assistant

## 2024-03-10 ENCOUNTER — Ambulatory Visit (HOSPITAL_COMMUNITY)
Admission: RE | Admit: 2024-03-10 | Discharge: 2024-03-10 | Disposition: A | Discharge status: Home / Self Care | Source: Ambulatory Visit | Attending: Internal Medicine | Admitting: Internal Medicine

## 2024-03-10 DIAGNOSIS — J929 Pleural plaque without asbestos: Secondary | ICD-10-CM | POA: Diagnosis not present

## 2024-03-10 DIAGNOSIS — J90 Pleural effusion, not elsewhere classified: Secondary | ICD-10-CM | POA: Insufficient documentation

## 2024-03-10 DIAGNOSIS — D649 Anemia, unspecified: Secondary | ICD-10-CM | POA: Insufficient documentation

## 2024-03-10 DIAGNOSIS — R0602 Shortness of breath: Secondary | ICD-10-CM | POA: Insufficient documentation

## 2024-03-10 DIAGNOSIS — C3490 Malignant neoplasm of unspecified part of unspecified bronchus or lung: Secondary | ICD-10-CM | POA: Insufficient documentation

## 2024-03-10 DIAGNOSIS — C3491 Malignant neoplasm of unspecified part of right bronchus or lung: Secondary | ICD-10-CM

## 2024-03-10 DIAGNOSIS — Z85118 Personal history of other malignant neoplasm of bronchus and lung: Secondary | ICD-10-CM | POA: Diagnosis not present

## 2024-03-10 DIAGNOSIS — Z902 Acquired absence of lung [part of]: Secondary | ICD-10-CM | POA: Insufficient documentation

## 2024-03-10 DIAGNOSIS — I700 Atherosclerosis of aorta: Secondary | ICD-10-CM | POA: Diagnosis not present

## 2024-03-10 LAB — COMPREHENSIVE METABOLIC PANEL WITH GFR
ALT: 12 U/L (ref 0–44)
AST: 20 U/L (ref 15–41)
Albumin: 4.2 g/dL (ref 3.5–5.0)
Alkaline Phosphatase: 54 U/L (ref 38–126)
Anion gap: 13 (ref 5–15)
BUN: 20 mg/dL (ref 8–23)
CO2: 23 mmol/L (ref 22–32)
Calcium: 9.3 mg/dL (ref 8.9–10.3)
Chloride: 97 mmol/L — ABNORMAL LOW (ref 98–111)
Creatinine, Ser: 0.84 mg/dL (ref 0.61–1.24)
GFR, Estimated: >60 mL/min (ref >60)
Glucose, Bld: 123 mg/dL — ABNORMAL HIGH (ref 70–99)
Potassium: 4.3 mmol/L (ref 3.5–5.1)
Sodium: 133 mmol/L — ABNORMAL LOW (ref 135–145)
Total Bilirubin: 0.8 mg/dL (ref 0.0–1.2)
Total Protein: 7 g/dL (ref 6.5–8.1)

## 2024-03-10 LAB — CBC WITH DIFFERENTIAL/PLATELET
Abs Immature Granulocytes: 0.02 K/uL (ref 0.00–0.07)
Basophils Absolute: 0 K/uL (ref 0.0–0.1)
Basophils Relative: 0 %
Eosinophils Absolute: 0.1 K/uL (ref 0.0–0.5)
Eosinophils Relative: 2 %
HCT: 36.5 % — ABNORMAL LOW (ref 39.0–52.0)
Hemoglobin: 12.2 g/dL — ABNORMAL LOW (ref 13.0–17.0)
Immature Granulocytes: 0 %
Lymphocytes Relative: 30 %
Lymphs Abs: 1.6 K/uL (ref 0.7–4.0)
MCH: 31.9 pg (ref 26.0–34.0)
MCHC: 33.4 g/dL (ref 30.0–36.0)
MCV: 95.3 fL (ref 80.0–100.0)
Monocytes Absolute: 1.2 K/uL — ABNORMAL HIGH (ref 0.1–1.0)
Monocytes Relative: 21 %
Neutro Abs: 2.5 K/uL (ref 1.7–7.7)
Neutrophils Relative %: 47 %
Platelets: 214 K/uL (ref 150–400)
RBC: 3.83 MIL/uL — ABNORMAL LOW (ref 4.22–5.81)
RDW: 16 % — ABNORMAL HIGH (ref 11.5–15.5)
WBC: 5.5 K/uL (ref 4.0–10.5)
nRBC: 0 % (ref 0.0–0.2)

## 2024-03-10 LAB — MAGNESIUM: Magnesium: 1.9 mg/dL (ref 1.7–2.4)

## 2024-03-10 MED ORDER — IOHEXOL 300 MG/ML  SOLN
75.0000 mL | Freq: Once | INTRAMUSCULAR | Status: AC | PRN
  Administered 2024-03-10: 75 mL via INTRAVENOUS

## 2024-03-11 ENCOUNTER — Other Ambulatory Visit: Payer: Self-pay | Admitting: Physician Assistant

## 2024-03-11 DIAGNOSIS — C3491 Malignant neoplasm of unspecified part of right bronchus or lung: Secondary | ICD-10-CM

## 2024-03-17 ENCOUNTER — Ambulatory Visit: Admitting: Internal Medicine

## 2024-03-20 ENCOUNTER — Inpatient Hospital Stay (HOSPITAL_BASED_OUTPATIENT_CLINIC_OR_DEPARTMENT_OTHER): Admitting: Internal Medicine

## 2024-03-20 ENCOUNTER — Telehealth: Payer: Self-pay | Admitting: Internal Medicine

## 2024-03-20 VITALS — BP 132/78 | HR 84 | Temp 97.3°F | Resp 17 | Ht 66.0 in | Wt 249.8 lb

## 2024-03-20 DIAGNOSIS — Z85118 Personal history of other malignant neoplasm of bronchus and lung: Secondary | ICD-10-CM | POA: Diagnosis not present

## 2024-03-20 DIAGNOSIS — Z902 Acquired absence of lung [part of]: Secondary | ICD-10-CM | POA: Diagnosis not present

## 2024-03-20 DIAGNOSIS — D649 Anemia, unspecified: Secondary | ICD-10-CM | POA: Diagnosis not present

## 2024-03-20 DIAGNOSIS — C3490 Malignant neoplasm of unspecified part of unspecified bronchus or lung: Secondary | ICD-10-CM

## 2024-03-20 DIAGNOSIS — R0602 Shortness of breath: Secondary | ICD-10-CM | POA: Diagnosis not present

## 2024-03-20 DIAGNOSIS — J90 Pleural effusion, not elsewhere classified: Secondary | ICD-10-CM | POA: Diagnosis not present

## 2024-03-20 NOTE — Telephone Encounter (Signed)
 Scheduled patient for December. Called patient and let him know of appointment details. He is aware.

## 2024-03-20 NOTE — Progress Notes (Signed)
 [facility] Telephone:([phone]) [phone]   Fax:([phone]) [phone]  OFFICE PROGRESS NOTE  Lari Elspeth BRAVO, MD [address] [place] [address]  DIAGNOSIS: Stage IIA (T2b, N0, M0) non-small cell lung cancer, adenocarcinoma. He presented with a right lower lobe lung mass. He was diagnosed in March 2025   Biomarker Findings HRD signature - HRDsig Negative Microsatellite status - MS-Stable Tumor Mutational Burden - 0 Muts/Mb Genomic Findings For a complete list of the genes assayed, please refer to the Appendix. CDKN2A loss KRAS G12V STK11 loss CDKN2B loss 7 Disease relevant genes with no reportable alterations: ALK, BRAF, EGFR, ERBB2, MET, RET, ROS1   PDL1: 0%   PRIOR THERAPY:  1) s/p Right lower lobectomy for  under the care of Dr. Shyrl which was completed on 10/25/23   2) Adjuvant chemotherapy with cisplatin  75 mg/m2 and alimta  500 mg/m2 IV every 3 weeks. First dose expected 12/14/23.  Status post 4 cycles.  CURRENT THERAPY: Observation  INTERVAL HISTORY: Bretton Tandy Woods 73 y.o. male returns to the clinic today for follow-up visit.  Discussed the use of AI scribe software for clinical note transcription with the patient, who gave verbal consent to proceed.  History of Present Illness [name] is a 73 year old male with stage IIA non-small cell lung cancer who presents for evaluation with repeat CT scan of the chest for re-staging of his disease. He is accompanied by his wife.  He was diagnosed with stage IIA non-small cell lung cancer, adenocarcinoma, in March 2025. The cancer was found to have no actionable mutation and negative PD-L1 expression. He underwent a right lower lobectomy followed by four cycles of adjuvant systemic chemotherapy with cisplatin  and Alimta . He has started feeling better in the last four to five days, although he experienced some hair loss during chemotherapy.  He has trouble breathing, which he attributes to the removal of part  of his lung. He also has a productive cough with mucus but no clear evidence of pneumonia. He has a history of heart disease and is concerned about calcification noted on his coronary arteries in the scan report. He is experiencing mild anemia with a hemoglobin level of 12.2, for which he has started taking iron supplements.  He has not seen a pulmonologist since the biopsy was performed.     MEDICAL HISTORY: Past Medical History:  Diagnosis Date   Aortic root dilatation (HCC)    Arthritis    Coronary atherosclerosis of native coronary artery    Multiple stents - RCA/LAD/diagonal, overlapping DES LAD, stent thrombosis 2005, ultimately CABG   Essential hypertension    H/O hiatal hernia    History of GI bleed    On nonsteroidals   Hyperlipemia    Myocardial infarction [facility])    Anterior with LAD stent thrombosis 2005, 2004 and 2005    Obesity    Paroxysmal atrial flutter (HCC)    Sleep apnea    CPAP daily    ALLERGIES:  is allergic to bee venom, other, and chlorhexidine .  MEDICATIONS:  Current Outpatient Medications  Medication Sig Dispense Refill   acetaminophen  (TYLENOL ) 500 MG tablet Take 1-2 tablets (500-1,000 mg total) by mouth every 6 (six) hours as needed.     aspirin  81 MG EC tablet Take 81 mg by mouth at bedtime.      atorvastatin  (LIPITOR) 20 MG tablet Take 1 tablet (20 mg total) by mouth every evening.     b complex vitamins tablet  1 tablet daily. B12-B6-Folic Acid  (Sublingual)     Cholecalciferol  (VITAMIN D ) 125 MCG (5000 UT) CAPS Take 5,000 Units by mouth daily.      Coenzyme Q10 (CO Q-10) 100 MG CAPS Take 100 mg by mouth daily.      cyclobenzaprine  (FLEXERIL ) 10 MG tablet Take 1 tablet (10 mg total) by mouth 3 (three) times daily as needed for muscle spasms. 30 tablet 0   dexamethasone  (DECADRON ) 4 MG tablet Take 1 tablet twice a day the day before, the day of, and the day after chemotherapy 40 tablet 2   EPINEPHrine  0.3 mg/0.3 mL IJ SOAJ injection Inject 0.3 mLs  into the skin as needed for anaphylaxis.      folic acid  (FOLVITE ) 1 MG tablet take 1 tablet (1 milligram total) by mouth daily. 30 tablet 0   gabapentin  (NEURONTIN ) 300 MG capsule Take 1 capsule (300 mg total) by mouth at bedtime for 2 days, THEN 1 capsule (300 mg total) 2 (two) times daily. 62 capsule 0   Krill Oil 1000 MG CAPS Take 1,000 mg by mouth daily.     loratadine  (CLARITIN ) 10 MG tablet Take 10 mg by mouth daily as needed for allergies.     losartan  (COZAAR ) 100 MG tablet Take 100 mg by mouth daily.     LYCOPENE PO Take 1 tablet by mouth daily.     magnesium  oxide (MAG-OX) 400 (240 Mg) MG tablet Take 1 tablet (400 mg total) by mouth daily. 30 tablet 0   metoprolol  (LOPRESSOR ) 50 MG tablet Take 50 mg by mouth 2 (two) times daily.     MOUNJARO 15 MG/0.5ML Pen Inject 15 mg into the skin once a week. Mondays     Multiple Vitamins-Minerals (EYE VITAMINS PO) Take 1 capsule by mouth daily. [employer] [facility]     naproxen  (NAPROSYN ) 500 MG tablet Take 500 mg by mouth 2 (two) times daily with a meal.     nitroGLYCERIN  (NITROSTAT ) 0.4 MG SL tablet Place 0.4 mg under the tongue every 5 (five) minutes as needed for chest pain. Up to 3x     OLIVE LEAF EXTRACT PO Take 1 capsule by mouth daily. 20,000 mg     ondansetron  (ZOFRAN ) 8 MG tablet Take 1 tablet (8 mg total) by mouth every 8 (eight) hours as needed for nausea or vomiting. Starting day 3 after chemo 30 tablet 2   Pomegranate, Punica granatum, (POMEGRANATE PO) Take 5,000 mg by mouth daily.     prochlorperazine  (COMPAZINE ) 10 MG tablet Take 1 tablet (10 mg total) by mouth every 6 (six) hours as needed. 30 tablet 2   Tadalafil  2.5 MG TABS Take 2.5 mg by mouth every morning.     Turmeric 500 MG TABS Take 500 mg by mouth 2 (two) times daily.      No current facility-administered medications for this visit.    SURGICAL HISTORY:  Past Surgical History:  Procedure Laterality Date   APPENDECTOMY     BREATH TEK H PYLORI  06/11/2012   Procedure: BREATH  TEK H PYLORI;  Surgeon: Alm VEAR Angle, MD;  Location: [facility];  Service: General;  Laterality: N/AMERL Odor   BRONCHIAL BIOPSY  09/11/2023   Procedure: BRONCHOSCOPY, WITH BIOPSY;  Surgeon: Shelah Lamar RAMAN, MD;  Location: [facility];  Service: Pulmonary;;   BRONCHIAL BRUSHINGS  09/11/2023   Procedure: BRONCHOSCOPY, WITH BRUSH BIOPSY;  Surgeon: Shelah Lamar RAMAN, MD;  Location: [facility];  Service: Pulmonary;;   BRONCHIAL NEEDLE ASPIRATION BIOPSY  09/11/2023   Procedure: BRONCHOSCOPY, WITH NEEDLE ASPIRATION BIOPSY;  Surgeon: Shelah Lamar RAMAN, MD;  Location: [facility];  Service: Pulmonary;;   CARDIAC SURGERY     CARPAL TUNNEL RELEASE Right 12/02/2010   CARPAL TUNNEL RELEASE  06/06/2011   Procedure: CARPAL TUNNEL RELEASE;  Surgeon: Lamar LULLA Leonor Mickey., MD;  Location: [facility];  Service: Orthopedics;  Laterality: Right;   CARPAL TUNNEL RELEASE Right 04/10/2013   Procedure: REVISION OF CARPAL TUNNEL RELEASE LIGAMENT RIGHT WRIST;  Surgeon: Lamar LULLA Leonor Mickey., MD;  Location: [facility];  Service: Orthopedics;  Laterality: Right;   CERVICAL FUSION  03/03/2009   CHOLECYSTECTOMY     CORONARY ANGIOPLASTY WITH STENT PLACEMENT  [phone]   CORONARY ARTERY BYPASS GRAFT N/A 12/30/2013   Procedure: CORONARY ARTERY BYPASS GRAFTING (CABG) x three, using left internal mammary artery and right leg greater sapheneous vein harvested endoscopically - LIMA to LAD, SVG-D, SVG-OM1;  Surgeon: Maude Fleeta Ochoa, MD;  Location: [facility];  Service: Open Heart Surgery;  Laterality: N/A;   ELBOW ARTHROSCOPY Right    FUDUCIAL PLACEMENT  09/11/2023   Procedure: INSERTION, FIDUCIAL MARKER, GOLD;  Surgeon: Shelah Lamar RAMAN, MD;  Location: [facility];  Service: Pulmonary;;   INTERCOSTAL NERVE BLOCK Right 10/25/2023   Procedure: BLOCK, NERVE, INTERCOSTAL;  Surgeon: Shyrl Linnie KIDD, MD;  Location: [facility];  Service: Thoracic;  Laterality: Right;   IRRIGATION AND DEBRIDEMENT SHOULDER Right  04/30/2020   Procedure: Right shoulder poly exchange and possible glensphere exchange and left shoulder injection;  Surgeon: Kay Kemps, MD;  Location: [facility];  Service: Orthopedics;  Laterality: Right;   LAPAROSCOPIC GASTRIC BANDING  08/31/2012   left carpal tunnel release      LEFT HEART CATHETERIZATION WITH CORONARY ANGIOGRAM N/A 12/26/2013   Procedure: LEFT HEART CATHETERIZATION WITH CORONARY ANGIOGRAM;  Surgeon: Alm LELON Clay, MD;  Location: [facility];  Service: Cardiovascular;  Laterality: N/A;   LOBECTOMY, LUNG, ROBOT-ASSISTED, USING VATS Right 10/25/2023   Procedure: LOBECTOMY, RIGHT LOWER LUNG, ROBOT-ASSISTED, USING VATS;  Surgeon: Shyrl Linnie KIDD, MD;  Location: [facility];  Service: Thoracic;  Laterality: Right;  RIGHT ROBOTIC ASSISTED THORACOSCOPY FOR RIGHT LOWER LOBECTOMY   LYMPH NODE BIOPSY Right 10/25/2023   Procedure: LYMPH NODE BIOPSY;  Surgeon: Shyrl Linnie KIDD, MD;  Location: [facility];  Service: Thoracic;  Laterality: Right;   REVERSE SHOULDER ARTHROPLASTY Right 01/30/2020   Procedure: REVERSE SHOULDER ARTHROPLASTY;  Surgeon: Kay Kemps, MD;  Location: [facility];  Service: Orthopedics;  Laterality: Right;  interscalene block   REVERSE SHOULDER ARTHROPLASTY Left 03/30/2023   Procedure: REVERSE SHOULDER ARTHROPLASTY;  Surgeon: Kay Kemps, MD;  Location: [facility];  Service: Orthopedics;  Laterality: Left;   SHOULDER ARTHROSCOPY Right 10/01/2009   TOTAL KNEE ARTHROPLASTY Right 07/03/2004   TOTAL KNEE ARTHROPLASTY Left 07/03/2005   ULNAR NERVE TRANSPOSITION  06/06/2011   Procedure: ULNAR NERVE DECOMPRESSION/TRANSPOSITION;  Surgeon: Lamar LULLA Leonor Mickey., MD;  Location: [facility];  Service: Orthopedics;  Laterality: Right;  decompression ulnar nerve right cubital tunnel     REVIEW OF SYSTEMS:  Constitutional: positive for fatigue Eyes: negative Ears, nose, mouth, throat, and face: negative Respiratory: positive for dyspnea on exertion Cardiovascular:  negative Gastrointestinal: negative Genitourinary:negative Integument/breast: negative Hematologic/lymphatic: negative Musculoskeletal:negative Neurological: negative Behavioral/Psych: negative Endocrine: negative Allergic/Immunologic: negative   PHYSICAL EXAMINATION: General appearance: alert, cooperative, fatigued, and no distress Head: Normocephalic, without obvious abnormality, atraumatic Neck: no adenopathy, no JVD, supple, symmetrical, trachea midline, and thyroid  not enlarged, symmetric,  no tenderness/mass/nodules Lymph nodes: Cervical, supraclavicular, and axillary nodes normal. Resp: clear to auscultation bilaterally Back: symmetric, no curvature. ROM normal. No CVA tenderness. Cardio: regular rate and rhythm, S1, S2 normal, no murmur, click, rub or gallop GI: soft, non-tender; bowel sounds normal; no masses,  no organomegaly Extremities: extremities normal, atraumatic, no cyanosis or edema Neurologic: Alert and oriented X 3, normal strength and tone. Normal symmetric reflexes. Normal coordination and gait  ECOG PERFORMANCE STATUS: 1 - Symptomatic but completely ambulatory  Blood pressure 132/78, pulse 84, temperature (!) 97.3 F (36.3 C), resp. rate 17, height 5' 6 (1.676 m), weight 249 lb 12.8 oz (113.3 kg), SpO2 96%.  LABORATORY DATA: Lab Results  Component Value Date   WBC 5.5 03/10/2024   HGB 12.2 (L) 03/10/2024   HCT 36.5 (L) 03/10/2024   MCV 95.3 03/10/2024   PLT 214 03/10/2024      Chemistry      Component Value Date/Time   NA 133 (L) 03/10/2024 1347   K 4.3 03/10/2024 1347   CL 97 (L) 03/10/2024 1347   CO2 23 03/10/2024 1347   BUN 20 03/10/2024 1347   CREATININE 0.84 03/10/2024 1347   CREATININE 0.67 02/14/2024 0815      Component Value Date/Time   CALCIUM  9.3 03/10/2024 1347   ALKPHOS 54 03/10/2024 1347   AST 20 03/10/2024 1347   AST 14 (L) 02/14/2024 0815   ALT 12 03/10/2024 1347   ALT 9 02/14/2024 0815   BILITOT 0.8 03/10/2024 1347    BILITOT 0.4 02/14/2024 0815       RADIOGRAPHIC STUDIES: CT Chest W Contrast Result Date: 03/13/2024 CLINICAL DATA:  Lung cancer non-small-cell, follow-up. * Tracking Code: BO * EXAM: CT CHEST WITH CONTRAST TECHNIQUE: Multidetector CT imaging of the chest was performed during intravenous contrast administration. RADIATION DOSE REDUCTION: This exam was performed according to the departmental dose-optimization program which includes automated exposure control, adjustment of the mA and/or kV according to patient size and/or use of iterative reconstruction technique. CONTRAST:  75mL OMNIPAQUE  IOHEXOL  300 MG/ML  SOLN COMPARISON:  Multiple priors including CT September 05, 2023 FINDINGS: Cardiovascular: Aortic atherosclerosis. Prior median sternotomy and CABG with dense calcifications of the native coronary vessels. Normal size heart. No significant pericardial effusion/thickening. Mediastinum/Nodes: No suspicious thyroid  nodule. Increased right hilar nodal tissue for instance measuring 8 mm on image 71/2 previously 4 mm. Prominent mediastinal lymph nodes are stable from prior examination for instance a high right paratracheal lymph node measuring 7 mm in short axis on image 39/2. Lungs/Pleura: Surgical change of right lower lobectomy. New small pleural effusion with adjacent consolidations. No new suspicious pulmonary nodules or masses. Calcified pleural plaques. Upper Abdomen: Gastric band. No suspicious adrenal nodule/mass gallbladder surgically absent. Musculoskeletal: No significant abdominopelvic free fluid. Prior median sternotomy. Thoracolumbar spondylosis. IMPRESSION: 1. Surgical change of right lower lobectomy with new small right pleural effusion and adjacent consolidations, likely atelectasis. No discrete suspicious masslike area identified in the poorly visualized surgical bed. Suggest continued attention on follow-up imaging. 2. Increased right hilar nodal tissue, nonspecific and possibly reactive. Suggest  continued attention on follow-up imaging. 3. No new suspicious pulmonary nodules or masses. 4. Aortic atherosclerosis. Aortic Atherosclerosis (ICD10-I70.0). Electronically Signed   By: Reyes Holder M.D.   On: 03/13/2024 15:10     ASSESSMENT AND PLAN: This is a very pleasant 73 years old white male with Stage IIA (T2b, N0, M0) non-small cell lung cancer, adenocarcinoma. He presented with a right lower lobe lung mass. He was  diagnosed in March 2025.  He is status post right lower lobectomy with lymph node sampling under the care of Dr. Shyrl.  The patient underwent adjuvant systemic chemotherapy with cisplatin  75 Mg/M2 and Alimta  500 Mg/M2 every 3 weeks.  He is status post 4 cycles. He tolerated his treatment well except for fatigue and shortness of breath with exertion. He had repeat CT scan of the chest performed recently.  I personally independently reviewed the scan and discussed the result with the patient and his wife.  His scan showed no concerning findings for disease recurrence or metastasis. Assessment and Plan Assessment & Plan Stage IIA non-small cell lung cancer, adenocarcinoma, post right lower lobectomy and adjuvant chemotherapy Post right lower lobectomy and four cycles of adjuvant chemotherapy with cisplatin  and Alimta . No actionable mutation and negative PD-L1 expression. Recent CT scan shows no new growth or spread, consistent with post-surgical changes. Immunotherapy not indicated due to low PD-L1 marker. - Continue surveillance with CT scans - Schedule next follow-up in three months  Right pleural effusion, post-surgical Right pleural effusion post-surgical, common after lobectomy due to inflammatory processes. No evidence of pneumonia or other concerning findings.  Shortness of breath after right lower lobectomy Shortness of breath likely multifactorial: post-lobectomy changes, heart disease, and overweight status. Possible contribution from past COPD or emphysema. No  prior issues with breathing until lobectomy. - Refer to pulmonologist for evaluation and management - Encourage gradual exercise to improve breathing and aid weight loss  Mild anemia, likely post-chemotherapy Mild anemia with hemoglobin at 12.2, likely post-chemotherapy. He has started taking iron supplements. Expected to improve as he is no longer on treatment.  Vaccinations post-treatment Discussed the importance of vaccinations post-treatment to prevent infections. - Administer flu and COVID vaccinations - Administer pneumonia vaccination if not previously received He was advised to call immediately if he has any other concerning symptoms in the interval. The patient voices understanding of current disease status and treatment options and is in agreement with the current care plan.  All questions were answered. The patient knows to call the clinic with any problems, questions or concerns. We can certainly see the patient much sooner if necessary. The total time spent in the appointment was 30 minutes including review of chart and various tests results, discussions about plan of care and coordination of care plan .   Disclaimer: This note was dictated with voice recognition software. Similar sounding words can inadvertently be transcribed and may not be corrected upon review.

## 2024-03-21 ENCOUNTER — Other Ambulatory Visit: Payer: Self-pay

## 2024-03-22 DIAGNOSIS — J029 Acute pharyngitis, unspecified: Secondary | ICD-10-CM | POA: Diagnosis not present

## 2024-03-22 DIAGNOSIS — Z112 Encounter for screening for other bacterial diseases: Secondary | ICD-10-CM | POA: Diagnosis not present

## 2024-03-24 ENCOUNTER — Telehealth: Payer: Self-pay | Admitting: Internal Medicine

## 2024-03-24 NOTE — Telephone Encounter (Signed)
 Rescheduled appointment to another campus per the patients request.

## 2024-03-28 ENCOUNTER — Other Ambulatory Visit: Payer: Self-pay | Admitting: Physician Assistant

## 2024-03-28 DIAGNOSIS — C3491 Malignant neoplasm of unspecified part of right bronchus or lung: Secondary | ICD-10-CM

## 2024-04-04 NOTE — Progress Notes (Signed)
 [name]                                          MRN: [id number]   04/04/2024   The [facility]BCI Quality Team Specialist reviewed this patient medical record for the purposes of chart review for care gap closure. The following were reviewed: chart review for care gap closure-glycemic status assessment.    VBCI Quality Team

## 2024-04-11 DIAGNOSIS — Z902 Acquired absence of lung [part of]: Secondary | ICD-10-CM | POA: Diagnosis not present

## 2024-04-11 DIAGNOSIS — J9811 Atelectasis: Secondary | ICD-10-CM | POA: Diagnosis not present

## 2024-04-11 DIAGNOSIS — J90 Pleural effusion, not elsewhere classified: Secondary | ICD-10-CM | POA: Diagnosis not present

## 2024-04-11 DIAGNOSIS — R051 Acute cough: Secondary | ICD-10-CM | POA: Diagnosis not present

## 2024-04-11 DIAGNOSIS — K047 Periapical abscess without sinus: Secondary | ICD-10-CM | POA: Diagnosis not present

## 2024-04-11 DIAGNOSIS — C3491 Malignant neoplasm of unspecified part of right bronchus or lung: Secondary | ICD-10-CM | POA: Diagnosis not present

## 2024-04-11 DIAGNOSIS — Z471 Aftercare following joint replacement surgery: Secondary | ICD-10-CM | POA: Diagnosis not present

## 2024-04-22 DIAGNOSIS — D229 Melanocytic nevi, unspecified: Secondary | ICD-10-CM | POA: Diagnosis not present

## 2024-05-21 ENCOUNTER — Encounter: Payer: Self-pay | Admitting: Cardiology

## 2024-05-21 ENCOUNTER — Ambulatory Visit: Attending: Cardiology | Admitting: Cardiology

## 2024-05-21 VITALS — BP 130/80 | HR 89 | Ht 66.5 in | Wt 246.2 lb

## 2024-05-21 DIAGNOSIS — E782 Mixed hyperlipidemia: Secondary | ICD-10-CM

## 2024-05-21 DIAGNOSIS — I25119 Atherosclerotic heart disease of native coronary artery with unspecified angina pectoris: Secondary | ICD-10-CM | POA: Diagnosis not present

## 2024-05-21 DIAGNOSIS — I7121 Aneurysm of the ascending aorta, without rupture: Secondary | ICD-10-CM

## 2024-05-21 DIAGNOSIS — I350 Nonrheumatic aortic (valve) stenosis: Secondary | ICD-10-CM | POA: Diagnosis not present

## 2024-05-21 NOTE — Patient Instructions (Signed)
 Medication Instructions:   Your physician recommends that you continue on your current medications as directed. Please refer to the Current Medication list given to you today.   Labwork: None today  Testing/Procedures: None today  Follow-Up: 6 months Dr.McDowell  Any Other Special Instructions Will Be Listed Below (If Applicable).  If you need a refill on your cardiac medications before your next appointment, please call your pharmacy.

## 2024-05-21 NOTE — Progress Notes (Signed)
    Cardiology Office Note  Date: 05/21/2024   ID: [name], DOB Feb 26, 1951, MRN [id number]  History of Present Illness: [name] is a 73 y.o. male last seen in April.  For a follow-up visit.  He underwent right lower lobectomy in April for treatment of non-small cell lung cancer.  Subsequently underwent adjuvant chemotherapy with cisplatin  and Alimta , now under observation through oncology.  States that he is feeling better gradually.  NYHA class II dyspnea with most activities, no angina or palpitations.  We went over his medications which are stable from a cardiac perspective.  He has not had to use any interval nitroglycerin .  I reviewed his most recent lab work which is noted below.  Interval cardiac testing is summarized below as well.  Physical Exam: VS:  BP 130/80 (BP Location: Left Arm)   Pulse 89   Ht 5' 6.5 (1.689 m)   Wt 246 lb 3.2 oz (111.7 kg)   SpO2 96%   BMI 39.14 kg/m , BMI Body mass index is 39.14 kg/m.  Wt Readings from Last 3 Encounters:  05/21/24 246 lb 3.2 oz (111.7 kg)  03/20/24 249 lb 12.8 oz (113.3 kg)  02/14/24 247 lb (112 kg)    General: Patient appears comfortable at rest. HEENT: Conjunctiva and lids normal. Neck: Supple, no elevated JVP or carotid bruits. Lungs: Clear to auscultation, nonlabored breathing at rest. Cardiac: Regular rate and rhythm, no S3, 3/6 systolic murmur. Extremities: No pitting edema.  ECG:  An ECG dated 10/08/2023 was personally reviewed today and demonstrated:  Sinus rhythm with PACs, old inferior infarct pattern, poor R wave progression rule out old anterior infarct pattern.  Labwork: 03/10/2024: ALT 12; AST 20; BUN 20; Creatinine, Ser 0.84; Hemoglobin 12.2; Magnesium  1.9; Platelets 214; Potassium 4.3; Sodium 133   Other Studies Reviewed Today:  No interval cardiac testing for review today.  Assessment and Plan:  1.  CAD status post multiple prior stent interventions to the RCA, LAD, and diagonal  followed ultimately by CABG in 2015 including LIMA to LAD, SVG to diagonal, and SVG to OM1.  LVEF 55% by echocardiogram in April.  Lexiscan  Myoview  at that time showed evidence of previous inferior/inferoseptal infarct scar but no active ischemia.  Clinically stable with no increasing angina on medical therapy.  Continue aspirin  81 mg daily, Lipitor 20 mg daily, and as needed nitroglycerin .  He is also on Mounjaro 5 mg weekly.   2.  Primary hypertension.  No change in current regimen, continue Cozaar  100 mg daily and Lopressor  50 mg twice daily.   3.  Mixed hyperlipidemia.  Continue Lipitor 20 mg daily and keep follow-up with PCP for routine lab work.   4.  Degenerative calcific aortic stenosis, mild by echocardiogram in April with mean AV gradient approximately 12 mmHg and dimensionless index 0.46.  Mild to moderate aortic regurgitation noted at that time as well.   5.  Ascending thoracic aortic dilatation, stable at 4.2 cm by chest CTA in May 2024.  Disposition:  Follow up 6 months.  Signed, Jayson JUDITHANN Sierras, M.D., F.A.C.C. [facility]

## 2024-06-09 ENCOUNTER — Other Ambulatory Visit: Payer: Self-pay

## 2024-06-09 DIAGNOSIS — C3491 Malignant neoplasm of unspecified part of right bronchus or lung: Secondary | ICD-10-CM

## 2024-06-10 ENCOUNTER — Ambulatory Visit (HOSPITAL_COMMUNITY)
Admission: RE | Admit: 2024-06-10 | Discharge: 2024-06-10 | Disposition: A | Source: Ambulatory Visit | Attending: Internal Medicine | Admitting: Internal Medicine

## 2024-06-10 ENCOUNTER — Other Ambulatory Visit

## 2024-06-10 ENCOUNTER — Inpatient Hospital Stay: Attending: Physician Assistant

## 2024-06-10 DIAGNOSIS — Z902 Acquired absence of lung [part of]: Secondary | ICD-10-CM | POA: Insufficient documentation

## 2024-06-10 DIAGNOSIS — Z85118 Personal history of other malignant neoplasm of bronchus and lung: Secondary | ICD-10-CM | POA: Diagnosis present

## 2024-06-10 DIAGNOSIS — C3490 Malignant neoplasm of unspecified part of unspecified bronchus or lung: Secondary | ICD-10-CM | POA: Insufficient documentation

## 2024-06-10 DIAGNOSIS — C3491 Malignant neoplasm of unspecified part of right bronchus or lung: Secondary | ICD-10-CM

## 2024-06-10 LAB — CBC WITH DIFFERENTIAL/PLATELET
Abs Immature Granulocytes: 0.04 K/uL (ref 0.00–0.07)
Basophils Absolute: 0.1 K/uL (ref 0.0–0.1)
Basophils Relative: 1 %
Eosinophils Absolute: 0.3 K/uL (ref 0.0–0.5)
Eosinophils Relative: 4 %
HCT: 42 % (ref 39.0–52.0)
Hemoglobin: 14 g/dL (ref 13.0–17.0)
Immature Granulocytes: 1 %
Lymphocytes Relative: 36 %
Lymphs Abs: 2.4 K/uL (ref 0.7–4.0)
MCH: 30.8 pg (ref 26.0–34.0)
MCHC: 33.3 g/dL (ref 30.0–36.0)
MCV: 92.5 fL (ref 80.0–100.0)
Monocytes Absolute: 1 K/uL (ref 0.1–1.0)
Monocytes Relative: 15 %
Neutro Abs: 3 K/uL (ref 1.7–7.7)
Neutrophils Relative %: 43 %
Platelets: 247 K/uL (ref 150–400)
RBC: 4.54 MIL/uL (ref 4.22–5.81)
RDW: 12.5 % (ref 11.5–15.5)
WBC: 6.7 K/uL (ref 4.0–10.5)
nRBC: 0 % (ref 0.0–0.2)

## 2024-06-10 LAB — COMPREHENSIVE METABOLIC PANEL WITH GFR
ALT: 19 U/L (ref 0–44)
AST: 25 U/L (ref 15–41)
Albumin: 4.5 g/dL (ref 3.5–5.0)
Alkaline Phosphatase: 61 U/L (ref 38–126)
Anion gap: 15 (ref 5–15)
BUN: 14 mg/dL (ref 8–23)
CO2: 21 mmol/L — ABNORMAL LOW (ref 22–32)
Calcium: 9.6 mg/dL (ref 8.9–10.3)
Chloride: 97 mmol/L — ABNORMAL LOW (ref 98–111)
Creatinine, Ser: 0.83 mg/dL (ref 0.61–1.24)
GFR, Estimated: >60 mL/min (ref >60)
Glucose, Bld: 89 mg/dL (ref 70–99)
Potassium: 4.5 mmol/L (ref 3.5–5.1)
Sodium: 133 mmol/L — ABNORMAL LOW (ref 135–145)
Total Bilirubin: 0.4 mg/dL (ref 0.0–1.2)
Total Protein: 7.7 g/dL (ref 6.5–8.1)

## 2024-06-10 MED ORDER — IOHEXOL 300 MG/ML  SOLN
80.0000 mL | Freq: Once | INTRAMUSCULAR | Status: AC | PRN
  Administered 2024-06-10: 80 mL via INTRAVENOUS

## 2024-06-19 ENCOUNTER — Ambulatory Visit: Admitting: Internal Medicine

## 2024-06-19 VITALS — BP 126/78 | HR 74 | Temp 98.0°F | Resp 17 | Ht 66.5 in | Wt 242.0 lb

## 2024-06-19 DIAGNOSIS — C3490 Malignant neoplasm of unspecified part of unspecified bronchus or lung: Secondary | ICD-10-CM

## 2024-06-19 DIAGNOSIS — Z85118 Personal history of other malignant neoplasm of bronchus and lung: Secondary | ICD-10-CM | POA: Diagnosis not present

## 2024-06-19 NOTE — Progress Notes (Signed)
 [facility] [phone]   Fax:([phone]  OFFICE PROGRESS NOTE  Lari Elspeth BRAVO, MD [address] [place] [address]  DIAGNOSIS: Stage IIA (T2b, N0, M0) non-small cell lung cancer, adenocarcinoma. He presented with a right lower lobe lung mass. He was diagnosed in March 2025   Biomarker Findings HRD signature - HRDsig Negative Microsatellite status - MS-Stable Tumor Mutational Burden - 0 Muts/Mb Genomic Findings For a complete list of the genes assayed, please refer to the Appendix. CDKN2A loss KRAS G12V STK11 loss CDKN2B loss 7 Disease relevant genes with no reportable alterations: ALK, BRAF, EGFR, ERBB2, MET, RET, ROS1   PDL1: 0%   PRIOR THERAPY:  1) s/p Right lower lobectomy for  under the care of Dr. Shyrl which was completed on 10/25/23   2) Adjuvant chemotherapy with cisplatin  75 mg/m2 and alimta  500 mg/m2 IV every 3 weeks. First dose expected 12/14/23.  Status post 4 cycles.  CURRENT THERAPY: Observation  INTERVAL HISTORY: [name] 73 y.o. male returns to the clinic today for follow-up visit.  Discussed the use of AI scribe software for clinical note transcription with the patient, who gave verbal consent to proceed.  History of Present Illness [name] is a 73 year old male with stage 2A non-small cell lung cancer, status post right lower lobectomy and adjuvant chemotherapy, who presents for routine follow-up and review of surveillance chest CT.  Diagnosed with stage 2A non-small cell lung adenocarcinoma in March 2025, he underwent right lower lobectomy with lymph node sampling in April 2025, followed by four cycles of adjuvant cisplatin  and pemetrexed , completed in September 2025. He is currently in the observation phase.  Since completing chemotherapy, he reports progressive improvement in overall well-being and has resumed normal activities. He describes the chemotherapy course as challenging but feels he has  recovered well.  He denies chest pain, nausea, or vomiting. He experiences mild exertional dyspnea, particularly in cold weather, describing that he becomes easily winded under such conditions. He reports no other new or worsening symptoms since the last visit three months ago.    MEDICAL HISTORY: Past Medical History:  Diagnosis Date   Aortic root dilatation    Arthritis    Coronary atherosclerosis of native coronary artery    Multiple stents - RCA/LAD/diagonal, overlapping DES LAD, stent thrombosis 2005, ultimately CABG   Essential hypertension    H/O hiatal hernia    History of GI bleed    On nonsteroidals   Hyperlipemia    Myocardial infarction [facility])    Anterior with LAD stent thrombosis 2005, 2004 and 2005    Obesity    Paroxysmal atrial flutter (HCC)    Sleep apnea    CPAP daily    ALLERGIES:  is allergic to bee venom, other, and chlorhexidine .  MEDICATIONS:  Current Outpatient Medications  Medication Sig Dispense Refill   acetaminophen  (TYLENOL ) 500 MG tablet Take 1-2 tablets (500-1,000 mg total) by mouth every 6 (six) hours as needed.     aspirin  81 MG EC tablet Take 81 mg by mouth at bedtime.      atorvastatin  (LIPITOR) 20 MG tablet Take 1 tablet (20 mg total) by mouth every evening.     b complex vitamins tablet 1 tablet daily. B12-B6-Folic Acid  (Sublingual)     Cholecalciferol  (VITAMIN D ) 125 MCG (5000 UT) CAPS Take 5,000 Units by mouth daily.      Coenzyme Q10 (CO Q-10) 100 MG CAPS Take 100 mg by mouth  daily.      cyclobenzaprine  (FLEXERIL ) 10 MG tablet Take 1 tablet (10 mg total) by mouth 3 (three) times daily as needed for muscle spasms. 30 tablet 0   dexamethasone  (DECADRON ) 4 MG tablet Take 1 tablet twice a day the day before, the day of, and the day after chemotherapy 40 tablet 2   EPINEPHrine  0.3 mg/0.3 mL IJ SOAJ injection Inject 0.3 mLs into the skin as needed for anaphylaxis.      folic acid  (FOLVITE ) 1 MG tablet take 1 tablet (1 milligram total) by mouth  daily. 30 tablet 0   gabapentin  (NEURONTIN ) 300 MG capsule Take 1 capsule (300 mg total) by mouth at bedtime for 2 days, THEN 1 capsule (300 mg total) 2 (two) times daily. 62 capsule 0   Krill Oil 1000 MG CAPS Take 1,000 mg by mouth daily.     loratadine  (CLARITIN ) 10 MG tablet Take 10 mg by mouth daily as needed for allergies.     losartan  (COZAAR ) 100 MG tablet Take 100 mg by mouth daily.     LYCOPENE PO Take 1 tablet by mouth daily.     magnesium  oxide (MAG-OX) 400 (240 Mg) MG tablet Take 1 tablet (400 mg total) by mouth daily. 30 tablet 0   metoprolol  (LOPRESSOR ) 50 MG tablet Take 50 mg by mouth 2 (two) times daily.     MOUNJARO 15 MG/0.5ML Pen Inject 15 mg into the skin once a week. Mondays (Patient not taking: Reported on 05/21/2024)     MOUNJARO 5 MG/0.5ML Pen Inject 5 mg into the skin once a week.     Multiple Vitamins-Minerals (EYE VITAMINS PO) Take 1 capsule by mouth daily. [employer]     naproxen  (NAPROSYN ) 500 MG tablet Take 500 mg by mouth 2 (two) times daily with a meal.     nitroGLYCERIN  (NITROSTAT ) 0.4 MG SL tablet Place 0.4 mg under the tongue every 5 (five) minutes as needed for chest pain. Up to 3x     OLIVE LEAF EXTRACT PO Take 1 capsule by mouth daily. 20,000 mg     ondansetron  (ZOFRAN ) 8 MG tablet Take 1 tablet (8 mg total) by mouth every 8 (eight) hours as needed for nausea or vomiting. Starting day 3 after chemo 30 tablet 2   Pomegranate, Pun[place] granatum, (POMEGRANATE PO) Take 5,000 mg by mouth daily.     prochlorperazine  (COMPAZINE ) 10 MG tablet Take 1 tablet (10 mg total) by mouth every 6 (six) hours as needed. 30 tablet 2   Tadalafil  2.5 MG TABS Take 2.5 mg by mouth every morning.     Turmeric 500 MG TABS Take 500 mg by mouth 2 (two) times daily.      No current facility-administered medications for this visit.    SURGICAL HISTORY:  Past Surgical History:  Procedure Laterality Date   APPENDECTOMY     BREATH TEK H PYLORI  06/11/2012   Procedure: BREATH TEK H  PYLORI;  Surgeon: Alm VEAR Angle, MD;  Location: [facility];  Service: General;  Laterality: N/AMERL Odor   BRONCHIAL BIOPSY  09/11/2023   Procedure: BRONCHOSCOPY, WITH BIOPSY;  Surgeon: Shelah Lamar RAMAN, MD;  Location: [facility] END[facility]COPY;  Service: Pulmonary;;   BRONCHIAL BRUSHINGS  09/11/2023   Procedure: BRONCHOSCOPY, WITH BRUSH BIOPSY;  Surgeon: Shelah Lamar RAMAN, MD;  Location: [facility];  Service: Pulmonary;;   BRONCHIAL NEEDLE ASPIRATION BIOPSY  09/11/2023   Procedure: BRONCHOSCOPY, WITH NEEDLE ASPIRATION BIOPSY;  Surgeon: Shelah Lamar RAMAN, MD;  Location: [facility]  ENDOSCOPY;  Service: Pulmonary;;   CARDIAC SURGERY     CARPAL TUNNEL RELEASE Right 12/02/2010   CARPAL TUNNEL RELEASE  06/06/2011   Procedure: CARPAL TUNNEL RELEASE;  Surgeon: Lamar LULLA Leonor Mickey., MD;  Location: [facility];  Service: Orthopedics;  Laterality: Right;   CARPAL TUNNEL RELEASE Right 04/10/2013   Procedure: REVISION OF CARPAL TUNNEL RELEASE LIGAMENT RIGHT WRIST;  Surgeon: Lamar LULLA Leonor Mickey., MD;  Location: [facility];  Service: Orthopedics;  Laterality: Right;   CERVICAL FUSION  03/03/2009   CHOLECYSTECTOMY     CORONARY ANGIOPLASTY WITH STENT PLACEMENT  [phone]   CORONARY ARTERY BYPASS GRAFT N/A 12/30/2013   Procedure: CORONARY ARTERY BYPASS GRAFTING (CABG) x three, using left internal mammary artery and right leg greater sapheneous vein harvested endoscopically - LIMA to LAD, SVG-D, SVG-OM1;  Surgeon: Maude Fleeta Ochoa, MD;  Location: [facility] OR;  Service: Open Heart Surgery;  Laterality: N/A;   ELBOW ARTHROSCOPY Right    FUDUCIAL PLACEMENT  09/11/2023   Procedure: INSERTION, FIDUCIAL MARKER, GOLD;  Surgeon: Shelah Lamar RAMAN, MD;  Location: [facility];  Service: Pulmonary;;   INTERCOSTAL NERVE BLOCK Right 10/25/2023   Procedure: BLOCK, NERVE, INTERCOSTAL;  Surgeon: Shyrl Linnie KIDD, MD;  Location: [facility] OR;  Service: Thoracic;  Laterality: Right;   IRRIGATION AND DEBRIDEMENT SHOULDER Right 04/30/2020    Procedure: Right shoulder poly exchange and possible glensphere exchange and left shoulder injection;  Surgeon: Kay Kemps, MD;  Location: [facility];  Service: Orthopedics;  Laterality: Right;   LAPAROSCOPIC GASTRIC BANDING  08/31/2012   left carpal tunnel release      LEFT HEART CATHETERIZATION WITH CORONARY ANGIOGRAM N/A 12/26/2013   Procedure: LEFT HEART CATHETERIZATION WITH CORONARY ANGIOGRAM;  Surgeon: Alm LELON Clay, MD;  Location: [facility] CATH LAB;  Service: Cardiovascular;  Laterality: N/A;   LOBECTOMY, LUNG, ROBOT-ASSISTED, USING VATS Right 10/25/2023   Procedure: LOBECTOMY, RIGHT LOWER LUNG, ROBOT-ASSISTED, USING VATS;  Surgeon: Shyrl Linnie KIDD, MD;  Location: [facility] OR;  Service: Thoracic;  Laterality: Right;  RIGHT ROBOTIC ASSISTED THORACOSCOPY FOR RIGHT LOWER LOBECTOMY   LYMPH NODE BIOPSY Right 10/25/2023   Procedure: LYMPH NODE BIOPSY;  Surgeon: Shyrl Linnie KIDD, MD;  Location: [facility];  Service: Thoracic;  Laterality: Right;   REVERSE SHOULDER ARTHROPLASTY Right 01/30/2020   Procedure: REVERSE SHOULDER ARTHROPLASTY;  Surgeon: Kay Kemps, MD;  Location: [facility];  Service: Orthopedics;  Laterality: Right;  interscalene block   REVERSE SHOULDER ARTHROPLASTY Left 03/30/2023   Procedure: REVERSE SHOULDER ARTHROPLASTY;  Surgeon: Kay Kemps, MD;  Location: [facility];  Service: Orthopedics;  Laterality: Left;   SHOULDER ARTHROSCOPY Right 10/01/2009   TOTAL KNEE ARTHROPLASTY Right 07/03/2004   TOTAL KNEE ARTHROPLASTY Left 07/03/2005   ULNAR NERVE TRANSPOSITION  06/06/2011   Procedure: ULNAR NERVE DECOMPRESSION/TRANSPOSITION;  Surgeon: Lamar LULLA Leonor Mickey., MD;  Location: [facility];  Service: Orthopedics;  Laterality: Right;  decompression ulnar nerve right cubital tunnel     REVIEW OF SYSTEMS:  A comprehensive review of systems was negative except for: Constitutional: positive for fatigue   PHYSICAL EXAMINATION: General appearance: alert, cooperative, fatigued, and no  distress Head: Normocephalic, without obvious abnormality, atraumatic Neck: no adenopathy, no JVD, supple, symmetrical, trachea midline, and thyroid  not enlarged, symmetric, no tenderness/mass/nodules Lymph nodes: Cervical, supraclavicular, and axillary nodes normal. Resp: clear to auscultation bilaterally Back: symmetric, no curvature. ROM normal. No CVA tenderness. Cardio: regular rate and rhythm, S1, S2 normal, no murmur, click, rub or gallop GI: soft, non-tender; bowel  sounds normal; no masses,  no organomegaly Extremities: extremities normal, atraumatic, no cyanosis or edema  ECOG PERFORMANCE STATUS: 1 - Symptomatic but completely ambulatory  Blood pressure 126/78, pulse 74, temperature 98 F (36.7 C), temperature source Temporal, resp. rate 17, height 5' 6.5 (1.689 m), weight 242 lb (109.8 kg), SpO2 98%.  LABORATORY DATA: Lab Results  Component Value Date   WBC 6.7 06/10/2024   HGB 14.0 06/10/2024   HCT 42.0 06/10/2024   MCV 92.5 06/10/2024   PLT 247 06/10/2024      Chemistry      Component Value Date/Time   NA 133 (L) 06/10/2024 1117   K 4.5 06/10/2024 1117   CL 97 (L) 06/10/2024 1117   CO2 21 (L) 06/10/2024 1117   BUN 14 06/10/2024 1117   CREATININE 0.83 06/10/2024 1117   CREATININE 0.67 02/14/2024 0815      Component Value Date/Time   CALCIUM  9.6 06/10/2024 1117   ALKPHOS 61 06/10/2024 1117   AST 25 06/10/2024 1117   AST 14 (L) 02/14/2024 0815   ALT 19 06/10/2024 1117   ALT 9 02/14/2024 0815   BILITOT 0.4 06/10/2024 1117   BILITOT 0.4 02/14/2024 0815       RADIOGRAPHIC STUDIES: CT Chest W Contrast Result Date: 06/10/2024 CLINICAL DATA:  Non-small cell lung cancer (NSCLC), staging. * Tracking Code: BO * EXAM: CT CHEST WITH CONTRAST TECHNIQUE: Multidetector CT imaging of the chest was performed during intravenous contrast administration. RADIATION DOSE REDUCTION: This exam was performed according to the departmental dose-optimization program which includes  automated exposure control, adjustment of the mA and/or kV according to patient size and/or use of iterative reconstruction technique. CONTRAST:  80mL OMNIPAQUE  IOHEXOL  300 MG/ML  SOLN COMPARISON:  CT scan chest from 03/10/2024. FINDINGS: Cardiovascular: Top-normal cardiac size. No pericardial effusion. No aortic aneurysm. There are coronary artery calcifications, in keeping with coronary artery disease. There are also mild-to-moderate peripheral atherosclerotic vascular calcifications of thoracic aorta and its major branches. Postsurgical changes from prior CABG noted. Mediastinum/Nodes: Visualized thyroid  gland appears grossly unremarkable. No solid / cystic mediastinal masses. The esophagus is nondistended precluding optimal assessment. No axillary, mediastinal or hilar lymphadenopathy by size criteria. Lungs/Pleura: The central tracheo-bronchial tree is patent. Redemonstration of postsurgical changes from prior right lower lobe lobectomy. There is small right pleural effusion, slightly decreased since the prior study. No mass, pneumothorax, lung collapse or consolidation. No left pleural effusion. No suspicious lung nodules. There are patchy areas of pleural calcifications in the left hemithorax, nonspecific but commonly seen as a sequela of prior asbestos related disease. Upper Abdomen: Gastric lap band noted. There is mild diffuse hepatic steatosis. Surgically absent gallbladder. Remaining visualized upper abdominal viscera within normal limits. Musculoskeletal: The visualized soft tissues of the chest wall are grossly unremarkable. No suspicious osseous lesions. There are moderate to severe multilevel degenerative changes in the visualized spine. Bilateral shoulder arthroplasty noted. IMPRESSION: 1. Redemonstration of postsurgical changes from prior right lower lobe lobectomy. No residual or recurrent tumor. No metastatic disease identified within the chest. 2. Multiple other nonacute observations, as  described above. Aortic Atherosclerosis (ICD10-I70.0). Electronically Signed   By: Ree Molt M.D.   On: 06/10/2024 13:58     ASSESSMENT AND PLAN: This is a very pleasant 73 years old white male with Stage IIA (T2b, N0, M0) non-small cell lung cancer, adenocarcinoma. He presented with a right lower lobe lung mass. He was diagnosed in March 2025.  He is status post right lower lobectomy with lymph node  sampling under the care of Dr. Shyrl.  The patient underwent adjuvant systemic chemotherapy with cisplatin  75 Mg/M2 and Alimta  500 Mg/M2 every 3 weeks.  He is status post 4 cycles. He tolerated his treatment well except for fatigue and shortness of breath with exertion. He had repeat CT scan of the chest performed recently.  I personally independently reviewed the scan and discussed the result with the patient today.  His scan showed no concerning findings for disease recurrence or metastasis. Assessment and Plan Assessment & Plan Stage IIa non-small cell lung cancer, status post right lower lobectomy and adjuvant chemotherapy, currently in remission He remains in remission following right lower lobectomy and completion of adjuvant cisplatin  and pemetrexed  in September 2025. Surveillance chest CT demonstrated no evidence of disease recurrence or progression. He reports improved well-being and functional status, with only mild exertional dyspnea in cold weather and no other concerning symptoms. No clinical or radiographic signs of recurrence. - Reviewed surveillance chest CT, which showed no evidence of recurrence or progression. - Performed physical examination to assess for recurrence or complications. - Transitioned surveillance interval to every six months given remission and stable imaging. He was advised to call immediately if he has any concerning symptoms in the interval.  The patient voices understanding of current disease status and treatment options and is in agreement with the current  care plan.  All questions were answered. The patient knows to call the clinic with any problems, questions or concerns. We can certainly see the patient much sooner if necessary. The total time spent in the appointment was 20 minutes including review of chart and various tests results, discussions about plan of care and coordination of care plan .   Disclaimer: This note was dictated with voice recognition software. Similar sounding words can inadvertently be transcribed and may not be corrected upon review.

## 2024-06-20 ENCOUNTER — Telehealth: Payer: Self-pay | Admitting: Internal Medicine

## 2024-06-20 NOTE — Telephone Encounter (Signed)
 Scheduled patient for a follow-up in 6 months. Called and spoke with the patient, he said that he would like for his labs to be done at [facility], but he is aware of the follow-up.

## 2024-06-21 ENCOUNTER — Other Ambulatory Visit: Payer: Self-pay

## 2024-06-30 ENCOUNTER — Encounter: Payer: Self-pay | Admitting: *Deleted

## 2024-12-18 ENCOUNTER — Inpatient Hospital Stay: Attending: Physician Assistant | Admitting: Internal Medicine
